# Patient Record
Sex: Female | Born: 1950 | Race: Black or African American | Smoking: Former smoker
Health system: Southern US, Community
[De-identification: ages and names within clinical notes are randomized; demographics above are authoritative.]

## PROBLEM LIST (undated history)

## (undated) DIAGNOSIS — K219 Gastro-esophageal reflux disease without esophagitis: Secondary | ICD-10-CM

## (undated) DIAGNOSIS — R002 Palpitations: Secondary | ICD-10-CM

## (undated) DIAGNOSIS — F17200 Nicotine dependence, unspecified, uncomplicated: Secondary | ICD-10-CM

## (undated) DIAGNOSIS — I639 Cerebral infarction, unspecified: Secondary | ICD-10-CM

## (undated) DIAGNOSIS — R454 Irritability and anger: Secondary | ICD-10-CM

## (undated) DIAGNOSIS — F419 Anxiety disorder, unspecified: Secondary | ICD-10-CM

## (undated) DIAGNOSIS — M1990 Unspecified osteoarthritis, unspecified site: Secondary | ICD-10-CM

## (undated) DIAGNOSIS — T7840XA Allergy, unspecified, initial encounter: Secondary | ICD-10-CM

## (undated) DIAGNOSIS — E785 Hyperlipidemia, unspecified: Secondary | ICD-10-CM

## (undated) DIAGNOSIS — R7302 Impaired glucose tolerance (oral): Secondary | ICD-10-CM

## (undated) DIAGNOSIS — H269 Unspecified cataract: Secondary | ICD-10-CM

## (undated) DIAGNOSIS — L509 Urticaria, unspecified: Secondary | ICD-10-CM

## (undated) DIAGNOSIS — G4700 Insomnia, unspecified: Secondary | ICD-10-CM

## (undated) DIAGNOSIS — D649 Anemia, unspecified: Secondary | ICD-10-CM

## (undated) DIAGNOSIS — F32A Depression, unspecified: Secondary | ICD-10-CM

## (undated) DIAGNOSIS — T783XXA Angioneurotic edema, initial encounter: Secondary | ICD-10-CM

## (undated) DIAGNOSIS — N189 Chronic kidney disease, unspecified: Secondary | ICD-10-CM

## (undated) DIAGNOSIS — E559 Vitamin D deficiency, unspecified: Secondary | ICD-10-CM

## (undated) DIAGNOSIS — R921 Mammographic calcification found on diagnostic imaging of breast: Secondary | ICD-10-CM

## (undated) DIAGNOSIS — T4145XA Adverse effect of unspecified anesthetic, initial encounter: Secondary | ICD-10-CM

## (undated) DIAGNOSIS — I7300 Raynaud's syndrome without gangrene: Secondary | ICD-10-CM

## (undated) DIAGNOSIS — M47817 Spondylosis without myelopathy or radiculopathy, lumbosacral region: Secondary | ICD-10-CM

## (undated) DIAGNOSIS — K5790 Diverticulosis of intestine, part unspecified, without perforation or abscess without bleeding: Secondary | ICD-10-CM

## (undated) DIAGNOSIS — I6529 Occlusion and stenosis of unspecified carotid artery: Secondary | ICD-10-CM

## (undated) DIAGNOSIS — F329 Major depressive disorder, single episode, unspecified: Secondary | ICD-10-CM

## (undated) DIAGNOSIS — I10 Essential (primary) hypertension: Secondary | ICD-10-CM

## (undated) DIAGNOSIS — T8859XA Other complications of anesthesia, initial encounter: Secondary | ICD-10-CM

## (undated) DIAGNOSIS — N951 Menopausal and female climacteric states: Secondary | ICD-10-CM

## (undated) DIAGNOSIS — IMO0002 Reserved for concepts with insufficient information to code with codable children: Secondary | ICD-10-CM

## (undated) DIAGNOSIS — N6019 Diffuse cystic mastopathy of unspecified breast: Secondary | ICD-10-CM

## (undated) HISTORY — DX: Urticaria, unspecified: L50.9

## (undated) HISTORY — DX: Unspecified osteoarthritis, unspecified site: M19.90

## (undated) HISTORY — DX: Allergy, unspecified, initial encounter: T78.40XA

## (undated) HISTORY — DX: Impaired glucose tolerance (oral): R73.02

## (undated) HISTORY — DX: Anxiety disorder, unspecified: F41.9

## (undated) HISTORY — DX: Vitamin D deficiency, unspecified: E55.9

## (undated) HISTORY — DX: Irritability and anger: R45.4

## (undated) HISTORY — PX: EYE SURGERY: SHX253

## (undated) HISTORY — DX: Mammographic calcification found on diagnostic imaging of breast: R92.1

## (undated) HISTORY — DX: Chronic kidney disease, unspecified: N18.9

## (undated) HISTORY — DX: Spondylosis without myelopathy or radiculopathy, lumbosacral region: M47.817

## (undated) HISTORY — DX: Nicotine dependence, unspecified, uncomplicated: F17.200

## (undated) HISTORY — DX: Unspecified cataract: H26.9

## (undated) HISTORY — DX: Occlusion and stenosis of unspecified carotid artery: I65.29

## (undated) HISTORY — DX: Menopausal and female climacteric states: N95.1

## (undated) HISTORY — PX: ABDOMINAL HYSTERECTOMY: SHX81

## (undated) HISTORY — DX: Diverticulosis of intestine, part unspecified, without perforation or abscess without bleeding: K57.90

## (undated) HISTORY — DX: Anemia, unspecified: D64.9

## (undated) HISTORY — DX: Reserved for concepts with insufficient information to code with codable children: IMO0002

## (undated) HISTORY — DX: Insomnia, unspecified: G47.00

## (undated) HISTORY — DX: Gastro-esophageal reflux disease without esophagitis: K21.9

## (undated) HISTORY — PX: TONSILLECTOMY AND ADENOIDECTOMY: SUR1326

## (undated) HISTORY — DX: Essential (primary) hypertension: I10

## (undated) HISTORY — PX: CAROTID ENDARTERECTOMY: SUR193

## (undated) HISTORY — PX: BREAST EXCISIONAL BIOPSY: SUR124

## (undated) HISTORY — DX: Raynaud's syndrome without gangrene: I73.00

## (undated) HISTORY — PX: BARTHOLIN GLAND CYST EXCISION: SHX565

## (undated) HISTORY — DX: Palpitations: R00.2

## (undated) HISTORY — PX: APPENDECTOMY: SHX54

## (undated) HISTORY — DX: Depression, unspecified: F32.A

## (undated) HISTORY — DX: Angioneurotic edema, initial encounter: T78.3XXA

## (undated) HISTORY — DX: Diffuse cystic mastopathy of unspecified breast: N60.19

## (undated) HISTORY — DX: Major depressive disorder, single episode, unspecified: F32.9

## (undated) HISTORY — DX: Hyperlipidemia, unspecified: E78.5

---
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
## 1986-03-05 HISTORY — PX: ABDOMINAL HYSTERECTOMY: SHX81

## 1998-03-08 ENCOUNTER — Ambulatory Visit (HOSPITAL_COMMUNITY): Admission: RE | Admit: 1998-03-08 | Discharge: 1998-03-08 | Payer: Self-pay | Admitting: Internal Medicine

## 1998-03-08 ENCOUNTER — Encounter: Payer: Self-pay | Admitting: Internal Medicine

## 1998-04-06 ENCOUNTER — Ambulatory Visit: Admission: RE | Admit: 1998-04-06 | Discharge: 1998-04-06 | Payer: Self-pay | Admitting: Internal Medicine

## 1998-09-02 ENCOUNTER — Other Ambulatory Visit: Admission: RE | Admit: 1998-09-02 | Discharge: 1998-09-02 | Payer: Self-pay | Admitting: Obstetrics and Gynecology

## 1999-02-17 ENCOUNTER — Emergency Department (HOSPITAL_COMMUNITY): Admission: EM | Admit: 1999-02-17 | Discharge: 1999-02-17 | Payer: Self-pay | Admitting: Internal Medicine

## 1999-02-17 ENCOUNTER — Encounter: Payer: Self-pay | Admitting: Internal Medicine

## 1999-11-01 ENCOUNTER — Other Ambulatory Visit: Admission: RE | Admit: 1999-11-01 | Discharge: 1999-11-01 | Payer: Self-pay | Admitting: Obstetrics and Gynecology

## 2000-11-01 ENCOUNTER — Other Ambulatory Visit: Admission: RE | Admit: 2000-11-01 | Discharge: 2000-11-01 | Payer: Self-pay | Admitting: Internal Medicine

## 2004-02-04 ENCOUNTER — Ambulatory Visit: Payer: Self-pay | Admitting: Internal Medicine

## 2004-02-11 ENCOUNTER — Ambulatory Visit: Payer: Self-pay | Admitting: Internal Medicine

## 2004-03-17 ENCOUNTER — Ambulatory Visit: Payer: Self-pay | Admitting: Gastroenterology

## 2004-03-27 ENCOUNTER — Ambulatory Visit: Payer: Self-pay | Admitting: Gastroenterology

## 2004-03-30 ENCOUNTER — Ambulatory Visit: Payer: Self-pay | Admitting: Gastroenterology

## 2005-01-11 ENCOUNTER — Ambulatory Visit: Payer: Self-pay | Admitting: Internal Medicine

## 2005-02-07 ENCOUNTER — Ambulatory Visit: Payer: Self-pay | Admitting: Internal Medicine

## 2005-02-13 ENCOUNTER — Ambulatory Visit: Payer: Self-pay | Admitting: Internal Medicine

## 2005-03-06 ENCOUNTER — Ambulatory Visit: Payer: Self-pay | Admitting: Internal Medicine

## 2005-05-21 ENCOUNTER — Ambulatory Visit: Payer: Self-pay | Admitting: Internal Medicine

## 2005-06-01 ENCOUNTER — Ambulatory Visit: Payer: Self-pay | Admitting: Internal Medicine

## 2005-06-01 ENCOUNTER — Ambulatory Visit: Payer: Self-pay

## 2005-07-11 ENCOUNTER — Ambulatory Visit: Payer: Self-pay | Admitting: Internal Medicine

## 2005-07-20 ENCOUNTER — Ambulatory Visit: Payer: Self-pay | Admitting: Internal Medicine

## 2005-08-03 ENCOUNTER — Ambulatory Visit: Payer: Self-pay | Admitting: Internal Medicine

## 2005-08-21 ENCOUNTER — Ambulatory Visit: Payer: Self-pay | Admitting: Internal Medicine

## 2005-08-29 ENCOUNTER — Ambulatory Visit: Payer: Self-pay | Admitting: Internal Medicine

## 2005-09-25 ENCOUNTER — Ambulatory Visit: Payer: Self-pay | Admitting: Internal Medicine

## 2005-12-31 ENCOUNTER — Ambulatory Visit: Payer: Self-pay | Admitting: Internal Medicine

## 2005-12-31 LAB — CONVERTED CEMR LAB
ALT: 19 units/L (ref 0–40)
AST: 20 units/L (ref 0–37)
Albumin: 3.4 g/dL — ABNORMAL LOW (ref 3.5–5.2)
Alkaline Phosphatase: 65 units/L (ref 39–117)
BUN: 9 mg/dL (ref 6–23)
CO2: 28 meq/L (ref 19–32)
Calcium: 9.2 mg/dL (ref 8.4–10.5)
Chloride: 108 meq/L (ref 96–112)
Chol/HDL Ratio, serum: 3.8
Cholesterol: 284 mg/dL (ref 0–200)
Creatinine, Ser: 0.8 mg/dL (ref 0.4–1.2)
GFR calc non Af Amer: 79 mL/min
Glomerular Filtration Rate, Af Am: 96 mL/min/{1.73_m2}
Glucose, Bld: 95 mg/dL (ref 70–99)
HDL: 75.7 mg/dL (ref >39.0)
LDL DIRECT: 176.9 mg/dL
Potassium: 3.8 meq/L (ref 3.5–5.1)
Sodium: 142 meq/L (ref 135–145)
Total Bilirubin: 0.5 mg/dL (ref 0.3–1.2)
Total Protein: 6.8 g/dL (ref 6.0–8.3)
Triglyceride fasting, serum: 83 mg/dL (ref 0–149)
VLDL: 17 mg/dL (ref 0–40)

## 2006-01-03 ENCOUNTER — Ambulatory Visit: Payer: Self-pay | Admitting: Internal Medicine

## 2006-01-10 ENCOUNTER — Ambulatory Visit: Payer: Self-pay | Admitting: Internal Medicine

## 2006-07-20 ENCOUNTER — Ambulatory Visit: Payer: Self-pay | Admitting: Family Medicine

## 2006-10-19 ENCOUNTER — Encounter: Payer: Self-pay | Admitting: *Deleted

## 2006-10-19 DIAGNOSIS — I10 Essential (primary) hypertension: Secondary | ICD-10-CM | POA: Insufficient documentation

## 2007-04-14 ENCOUNTER — Encounter: Payer: Self-pay | Admitting: Internal Medicine

## 2007-04-15 ENCOUNTER — Telehealth (INDEPENDENT_AMBULATORY_CARE_PROVIDER_SITE_OTHER): Payer: Self-pay | Admitting: *Deleted

## 2007-04-22 ENCOUNTER — Encounter: Payer: Self-pay | Admitting: Internal Medicine

## 2007-04-30 ENCOUNTER — Ambulatory Visit: Payer: Self-pay | Admitting: Internal Medicine

## 2007-04-30 LAB — CONVERTED CEMR LAB
ALT: 15 units/L (ref 0–35)
AST: 17 units/L (ref 0–37)
Albumin: 3.5 g/dL (ref 3.5–5.2)
Alkaline Phosphatase: 73 units/L (ref 39–117)
BUN: 10 mg/dL (ref 6–23)
Basophils Absolute: 0 10*3/uL (ref 0.0–0.1)
Basophils Relative: 0.7 % (ref 0.0–1.0)
Bilirubin Urine: NEGATIVE
Bilirubin, Direct: 0.1 mg/dL (ref 0.0–0.3)
CO2: 29 meq/L (ref 19–32)
Calcium: 9.5 mg/dL (ref 8.4–10.5)
Chloride: 106 meq/L (ref 96–112)
Cholesterol: 223 mg/dL (ref 0–200)
Creatinine, Ser: 0.6 mg/dL (ref 0.4–1.2)
Direct LDL: 132.9 mg/dL
Eosinophils Absolute: 0.1 10*3/uL (ref 0.0–0.6)
Eosinophils Relative: 1.6 % (ref 0.0–5.0)
GFR calc Af Amer: 133 mL/min
GFR calc non Af Amer: 110 mL/min
Glucose, Bld: 80 mg/dL (ref 70–99)
HCT: 39.8 % (ref 36.0–46.0)
HDL: 81.1 mg/dL (ref >39.0)
Hemoglobin, Urine: NEGATIVE
Hemoglobin: 12.9 g/dL (ref 12.0–15.0)
Ketones, ur: NEGATIVE mg/dL
Leukocytes, UA: NEGATIVE
Lymphocytes Relative: 43.2 % (ref 12.0–46.0)
MCHC: 32.4 g/dL (ref 30.0–36.0)
MCV: 84.9 fL (ref 78.0–100.0)
Monocytes Absolute: 0.6 10*3/uL (ref 0.2–0.7)
Monocytes Relative: 11.4 % — ABNORMAL HIGH (ref 3.0–11.0)
Neutro Abs: 2.4 10*3/uL (ref 1.4–7.7)
Neutrophils Relative %: 43.1 % (ref 43.0–77.0)
Nitrite: NEGATIVE
Platelets: 327 10*3/uL (ref 150–400)
Potassium: 3.5 meq/L (ref 3.5–5.1)
RBC: 4.69 M/uL (ref 3.87–5.11)
RDW: 15.2 % — ABNORMAL HIGH (ref 11.5–14.6)
Sodium: 140 meq/L (ref 135–145)
Specific Gravity, Urine: 1.015 (ref 1.000–1.03)
TSH: 1.97 microintl units/mL (ref 0.35–5.50)
Total Bilirubin: 0.6 mg/dL (ref 0.3–1.2)
Total CHOL/HDL Ratio: 2.7
Total Protein, Urine: NEGATIVE mg/dL
Total Protein: 6.9 g/dL (ref 6.0–8.3)
Triglycerides: 67 mg/dL (ref 0–149)
Urine Glucose: NEGATIVE mg/dL
Urobilinogen, UA: 0.2 (ref 0.0–1.0)
VLDL: 13 mg/dL (ref 0–40)
WBC: 5.6 10*3/uL (ref 4.5–10.5)
pH: 6.5 (ref 5.0–8.0)

## 2007-05-06 ENCOUNTER — Ambulatory Visit: Payer: Self-pay | Admitting: Internal Medicine

## 2007-05-06 DIAGNOSIS — R079 Chest pain, unspecified: Secondary | ICD-10-CM | POA: Insufficient documentation

## 2007-05-06 DIAGNOSIS — F17200 Nicotine dependence, unspecified, uncomplicated: Secondary | ICD-10-CM | POA: Insufficient documentation

## 2007-05-06 DIAGNOSIS — N760 Acute vaginitis: Secondary | ICD-10-CM | POA: Insufficient documentation

## 2007-05-06 DIAGNOSIS — E785 Hyperlipidemia, unspecified: Secondary | ICD-10-CM | POA: Insufficient documentation

## 2007-06-04 ENCOUNTER — Encounter: Payer: Self-pay | Admitting: Internal Medicine

## 2007-10-24 ENCOUNTER — Emergency Department: Payer: Self-pay | Admitting: Emergency Medicine

## 2008-03-01 ENCOUNTER — Telehealth: Payer: Self-pay | Admitting: Internal Medicine

## 2008-06-01 ENCOUNTER — Telehealth: Payer: Self-pay | Admitting: Internal Medicine

## 2008-07-15 ENCOUNTER — Ambulatory Visit: Payer: Self-pay

## 2008-12-22 ENCOUNTER — Encounter: Payer: Self-pay | Admitting: Rheumatology

## 2008-12-28 ENCOUNTER — Emergency Department: Payer: Self-pay | Admitting: Emergency Medicine

## 2009-01-03 ENCOUNTER — Encounter: Payer: Self-pay | Admitting: Rheumatology

## 2010-02-15 ENCOUNTER — Ambulatory Visit: Payer: Self-pay

## 2010-03-05 DIAGNOSIS — R921 Mammographic calcification found on diagnostic imaging of breast: Secondary | ICD-10-CM

## 2010-03-05 HISTORY — DX: Mammographic calcification found on diagnostic imaging of breast: R92.1

## 2010-03-08 ENCOUNTER — Ambulatory Visit: Payer: Self-pay

## 2010-04-05 HISTORY — PX: COLONOSCOPY W/ POLYPECTOMY: SHX1380

## 2010-04-12 ENCOUNTER — Ambulatory Visit: Payer: Self-pay | Admitting: Gastroenterology

## 2010-04-12 LAB — HM COLONOSCOPY

## 2010-05-01 ENCOUNTER — Observation Stay: Payer: Self-pay | Admitting: Internal Medicine

## 2010-06-06 ENCOUNTER — Ambulatory Visit: Payer: Self-pay | Admitting: Family Medicine

## 2010-07-03 ENCOUNTER — Ambulatory Visit: Payer: Self-pay | Admitting: General Surgery

## 2010-07-05 LAB — PATHOLOGY REPORT

## 2010-07-21 NOTE — Assessment & Plan Note (Signed)
[facility]                             PRIMARY CARE OFFICE NOTE   Judy Carlson, Judy Carlson                        MRN:          [id number]  DATE:01/29/2006                            DOB:          1950/11/29    TELEPHONE NOTE   [name] was last seen in the office January 09, 2006.  There had been a  mix-up in her medications being filled and she was given amlodipine instead  of Caduet.   The patient calls today to report that she is having problems with  circulation where her hands will get cold and tingly and have no blood  flow.  She believes this is related to the Norvasc/amlodipine.   I advised the patient to discontinue amlodipine for 7-10 days to see if her  symptoms improve, given that calcium channel blockers usually do not cause  vasoconstriction.  The patient is also called in a prescription for Lipitor  20 mg daily.     Rosalyn Gess Norins, MD  Electronically Signed    MEN/MedQ  DD: 01/29/2006  DT: 01/29/2006  Job #: [id number]

## 2010-10-05 ENCOUNTER — Ambulatory Visit: Payer: Self-pay | Admitting: General Surgery

## 2010-10-11 ENCOUNTER — Ambulatory Visit: Payer: Self-pay | Admitting: Rheumatology

## 2010-11-04 HISTORY — PX: BREAST BIOPSY: SHX20

## 2010-11-09 ENCOUNTER — Ambulatory Visit: Payer: Self-pay | Admitting: General Surgery

## 2010-11-13 LAB — PATHOLOGY REPORT

## 2011-02-22 ENCOUNTER — Other Ambulatory Visit: Payer: Self-pay | Admitting: Family Medicine

## 2011-03-06 DIAGNOSIS — IMO0002 Reserved for concepts with insufficient information to code with codable children: Secondary | ICD-10-CM

## 2011-03-06 HISTORY — DX: Reserved for concepts with insufficient information to code with codable children: IMO0002

## 2011-03-28 ENCOUNTER — Ambulatory Visit: Payer: Self-pay | Admitting: General Surgery

## 2011-05-02 ENCOUNTER — Other Ambulatory Visit: Payer: Self-pay | Admitting: Family Medicine

## 2011-05-02 LAB — CBC WITH DIFFERENTIAL/PLATELET
Basophil #: 0 10*3/uL (ref 0.0–0.1)
Eosinophil #: 0.1 10*3/uL (ref 0.0–0.7)
Eosinophil %: 1.1 %
HCT: 39.7 % (ref 35.0–47.0)
HGB: 13 g/dL (ref 12.0–16.0)
Lymphocyte #: 2.2 10*3/uL (ref 1.0–3.6)
Lymphocyte %: 33.6 %
MCH: 27.7 pg (ref 26.0–34.0)
Monocyte #: 0.9 10*3/uL — ABNORMAL HIGH (ref 0.0–0.7)
Neutrophil %: 51.4 %
RBC: 4.68 10*6/uL (ref 3.80–5.20)

## 2011-05-02 LAB — LIPID PANEL
Cholesterol: 231 mg/dL — ABNORMAL HIGH (ref 0–200)
HDL Cholesterol: 89 mg/dL — ABNORMAL HIGH (ref 40–60)
Ldl Cholesterol, Calc: 130 mg/dL — ABNORMAL HIGH (ref 0–100)
Triglycerides: 59 mg/dL (ref 0–200)
VLDL Cholesterol, Calc: 12 mg/dL (ref 5–40)

## 2011-05-02 LAB — COMPREHENSIVE METABOLIC PANEL
Albumin: 4 g/dL (ref 3.4–5.0)
Alkaline Phosphatase: 93 U/L (ref 50–136)
Anion Gap: 10 (ref 7–16)
Calcium, Total: 9.9 mg/dL (ref 8.5–10.1)
Chloride: 107 mmol/L (ref 98–107)
Co2: 29 mmol/L (ref 21–32)
EGFR (African American): >60
Glucose: 89 mg/dL (ref 65–99)
Osmolality: 292 (ref 275–301)
SGOT(AST): 24 U/L (ref 15–37)
Sodium: 146 mmol/L — ABNORMAL HIGH (ref 136–145)

## 2011-05-02 LAB — CK: CK, Total: 68 U/L (ref 21–215)

## 2011-06-19 ENCOUNTER — Ambulatory Visit: Payer: Self-pay | Admitting: Family Medicine

## 2011-06-19 LAB — CBC WITH DIFFERENTIAL/PLATELET
Eosinophil %: 2.4 %
HGB: 13.3 g/dL (ref 12.0–16.0)
Lymphocyte #: 2.8 10*3/uL (ref 1.0–3.6)
MCH: 27.8 pg (ref 26.0–34.0)
Monocyte #: 0.9 x10 3/mm (ref 0.2–0.9)
Neutrophil #: 3.1 10*3/uL (ref 1.4–6.5)
Neutrophil %: 44.7 %
RDW: 16.4 % — ABNORMAL HIGH (ref 11.5–14.5)

## 2011-06-19 LAB — COMPREHENSIVE METABOLIC PANEL
Albumin: 4.1 g/dL (ref 3.4–5.0)
Anion Gap: 8 (ref 7–16)
BUN: 17 mg/dL (ref 7–18)
Bilirubin,Total: 0.2 mg/dL (ref 0.2–1.0)
Calcium, Total: 9.7 mg/dL (ref 8.5–10.1)
Chloride: 107 mmol/L (ref 98–107)
Creatinine: 0.7 mg/dL (ref 0.60–1.30)
EGFR (African American): >60
Osmolality: 284 (ref 275–301)
Potassium: 4.3 mmol/L (ref 3.5–5.1)
SGOT(AST): 24 U/L (ref 15–37)
Sodium: 142 mmol/L (ref 136–145)

## 2011-06-19 LAB — LIPID PANEL
Cholesterol: 241 mg/dL — ABNORMAL HIGH (ref 0–200)
HDL Cholesterol: 84 mg/dL — ABNORMAL HIGH (ref 40–60)
Ldl Cholesterol, Calc: 143 mg/dL — ABNORMAL HIGH (ref 0–100)
Triglycerides: 71 mg/dL (ref 0–200)
VLDL Cholesterol, Calc: 14 mg/dL (ref 5–40)

## 2011-06-19 LAB — CK: CK, Total: 186 U/L (ref 21–215)

## 2011-06-19 LAB — HEMOGLOBIN A1C: Hemoglobin A1C: 5.8 % (ref 4.2–6.3)

## 2012-02-04 ENCOUNTER — Encounter: Payer: Self-pay | Admitting: Family Medicine

## 2012-02-04 ENCOUNTER — Ambulatory Visit (INDEPENDENT_AMBULATORY_CARE_PROVIDER_SITE_OTHER): Payer: Self-pay | Admitting: Family Medicine

## 2012-02-04 VITALS — BP 108/74 | HR 62 | Temp 98.1°F | Resp 16 | Ht 64.0 in | Wt 153.2 lb

## 2012-02-04 DIAGNOSIS — E785 Hyperlipidemia, unspecified: Secondary | ICD-10-CM | POA: Insufficient documentation

## 2012-02-04 DIAGNOSIS — F430 Acute stress reaction: Secondary | ICD-10-CM | POA: Insufficient documentation

## 2012-02-04 DIAGNOSIS — E559 Vitamin D deficiency, unspecified: Secondary | ICD-10-CM

## 2012-02-04 DIAGNOSIS — E7800 Pure hypercholesterolemia, unspecified: Secondary | ICD-10-CM

## 2012-02-04 DIAGNOSIS — I10 Essential (primary) hypertension: Secondary | ICD-10-CM | POA: Insufficient documentation

## 2012-02-04 DIAGNOSIS — L0233 Carbuncle of buttock: Secondary | ICD-10-CM

## 2012-02-04 DIAGNOSIS — Z23 Encounter for immunization: Secondary | ICD-10-CM | POA: Insufficient documentation

## 2012-02-04 LAB — COMPREHENSIVE METABOLIC PANEL
ALT: 16 U/L (ref 0–35)
Albumin: 4.5 g/dL (ref 3.5–5.2)
CO2: 29 mEq/L (ref 19–32)
Calcium: 9.9 mg/dL (ref 8.4–10.5)
Chloride: 107 mEq/L (ref 96–112)
Creat: 0.82 mg/dL (ref 0.50–1.10)
Sodium: 145 mEq/L (ref 135–145)
Total Protein: 7.1 g/dL (ref 6.0–8.3)

## 2012-02-04 LAB — LIPID PANEL
LDL Cholesterol: 135 mg/dL — ABNORMAL HIGH (ref 0–99)
Triglycerides: 61 mg/dL (ref <150)

## 2012-02-04 MED ORDER — SULFAMETHOXAZOLE-TRIMETHOPRIM 800-160 MG PO TABS: 1.0000 | ORAL_TABLET | Freq: Two times a day (BID) | ORAL | Status: DC

## 2012-02-04 NOTE — Assessment & Plan Note (Signed)
Controlled; continue current medications; obtain labs.
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
## 2012-02-04 NOTE — Assessment & Plan Note (Signed)
New.  S/p I&D in office with scant drainage expressed. Warm soaks daily for next two weeks;  Rx for Bactrim provided.  RTC for acute worsening.

## 2012-02-04 NOTE — Assessment & Plan Note (Signed)
Administered in office.

## 2012-02-04 NOTE — Assessment & Plan Note (Signed)
Controlled; obtain labs; continue current medication. 

## 2012-02-04 NOTE — Assessment & Plan Note (Signed)
Uncontrolled; obtain labs; continue current supplementation. 
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
## 2012-02-05 ENCOUNTER — Encounter: Payer: Self-pay | Admitting: Gastroenterology

## 2012-04-09 ENCOUNTER — Ambulatory Visit: Payer: Self-pay | Admitting: General Surgery

## 2012-07-16 ENCOUNTER — Other Ambulatory Visit: Payer: Self-pay | Admitting: Family Medicine

## 2012-08-04 ENCOUNTER — Ambulatory Visit (INDEPENDENT_AMBULATORY_CARE_PROVIDER_SITE_OTHER): Payer: Managed Care, Other (non HMO) | Admitting: Family Medicine

## 2012-08-04 ENCOUNTER — Encounter: Payer: Self-pay | Admitting: Family Medicine

## 2012-08-04 VITALS — BP 138/78 | HR 81 | Temp 98.1°F | Resp 16 | Ht 64.5 in | Wt 156.8 lb

## 2012-08-04 DIAGNOSIS — R7309 Other abnormal glucose: Secondary | ICD-10-CM

## 2012-08-04 DIAGNOSIS — I878 Other specified disorders of veins: Secondary | ICD-10-CM

## 2012-08-04 DIAGNOSIS — E559 Vitamin D deficiency, unspecified: Secondary | ICD-10-CM

## 2012-08-04 DIAGNOSIS — I10 Essential (primary) hypertension: Secondary | ICD-10-CM

## 2012-08-04 DIAGNOSIS — E7800 Pure hypercholesterolemia, unspecified: Secondary | ICD-10-CM

## 2012-08-04 DIAGNOSIS — I872 Venous insufficiency (chronic) (peripheral): Secondary | ICD-10-CM

## 2012-08-04 DIAGNOSIS — F430 Acute stress reaction: Secondary | ICD-10-CM

## 2012-08-04 MED ORDER — MELOXICAM 15 MG PO TABS: 15.0000 mg | ORAL_TABLET | Freq: Every day | ORAL | Status: DC

## 2012-08-04 MED ORDER — PRAVASTATIN SODIUM 40 MG PO TABS: 40.0000 mg | ORAL_TABLET | Freq: Every day | ORAL | Status: DC

## 2012-08-04 MED ORDER — METOPROLOL SUCCINATE ER 100 MG PO TB24: 100.0000 mg | ORAL_TABLET | Freq: Every day | ORAL | Status: DC

## 2012-08-04 MED ORDER — FUROSEMIDE 40 MG PO TABS: 40.0000 mg | ORAL_TABLET | Freq: Every day | ORAL | Status: DC

## 2012-08-04 MED ORDER — AMLODIPINE BESYLATE 10 MG PO TABS: 10.0000 mg | ORAL_TABLET | Freq: Every day | ORAL | Status: DC

## 2012-08-04 NOTE — Assessment & Plan Note (Signed)
Controlled.  Obtain labs; continue current medications.

## 2012-08-04 NOTE — Assessment & Plan Note (Signed)
Stable; obtain labs; refill provided.

## 2012-08-04 NOTE — Assessment & Plan Note (Signed)
Controlled; obtain labs; continue current medications; refills provided.

## 2012-08-04 NOTE — Assessment & Plan Note (Signed)
Controlled; obtain labs; continue current medications. 
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
## 2012-08-04 NOTE — Assessment & Plan Note (Signed)
Improved; plans to move in with father in upcoming few months.

## 2012-08-05 LAB — COMPREHENSIVE METABOLIC PANEL
AST: 24 IU/L (ref 0–40)
Albumin/Globulin Ratio: 1.6 (ref 1.1–2.5)
Alkaline Phosphatase: 113 IU/L (ref 39–117)
BUN/Creatinine Ratio: 13 (ref 11–26)
Creatinine, Ser: 0.76 mg/dL (ref 0.57–1.00)
GFR calc Af Amer: 97 mL/min/{1.73_m2} (ref >59)
GFR calc non Af Amer: 84 mL/min/{1.73_m2} (ref >59)
Globulin, Total: 2.7 g/dL (ref 1.5–4.5)
Sodium: 141 mmol/L (ref 134–144)

## 2012-08-05 LAB — CBC WITH DIFFERENTIAL/PLATELET
Basos: 0 % (ref 0–3)
Eos: 1 % (ref 0–5)
HCT: 38.2 % (ref 34.0–46.6)
Immature Granulocytes: 0 % (ref 0–2)
Lymphocytes Absolute: 2.4 10*3/uL (ref 0.7–3.1)
MCV: 81 fL (ref 79–97)
Monocytes Absolute: 0.7 10*3/uL (ref 0.1–0.9)
RDW: 17.7 % — ABNORMAL HIGH (ref 12.3–15.4)
WBC: 8 10*3/uL (ref 3.4–10.8)

## 2012-08-05 LAB — VITAMIN D 25 HYDROXY (VIT D DEFICIENCY, FRACTURES): Vit D, 25-Hydroxy: 37 ng/mL (ref 30.0–100.0)

## 2012-08-05 LAB — TSH: TSH: 1.28 u[IU]/mL (ref 0.450–4.500)

## 2012-08-05 LAB — LIPID PANEL: VLDL Cholesterol Cal: 13 mg/dL (ref 5–40)

## 2012-08-20 ENCOUNTER — Telehealth: Payer: Self-pay

## 2012-08-20 MED ORDER — ATORVASTATIN CALCIUM 10 MG PO TABS: 10.0000 mg | ORAL_TABLET | Freq: Every day | ORAL | Status: DC

## 2012-08-20 NOTE — Addendum Note (Signed)
Addended by: Ethelda Chick on: 08/20/2012 11:29 AM   Modules accepted: Orders

## 2012-08-20 NOTE — Telephone Encounter (Signed)
Pt states that Dr.Smith was going to change her from  prevastatin to Lipitor pt states that she is unable to take Lipitor due to the leg cramps she experiences from taking it. Best# [phone]

## 2012-08-20 NOTE — Telephone Encounter (Signed)
Please thank patient for calling back; I was thinking that she may have had a side effect to Lipitor in the past.  I did already send in rx for Lipitor to pharmacy; please call pharmacy and cancel rx for Lipitor.  Continue current dose of Pravastatin for now; we will just repeat cholesterol at follow-up visit and adjust medication if needed at that time.

## 2012-08-21 NOTE — Telephone Encounter (Signed)
Called patient called pharmacy.

## 2012-08-22 ENCOUNTER — Encounter: Payer: Self-pay | Admitting: *Deleted

## 2012-09-03 ENCOUNTER — Encounter: Payer: Self-pay | Admitting: *Deleted

## 2012-10-02 ENCOUNTER — Ambulatory Visit (INDEPENDENT_AMBULATORY_CARE_PROVIDER_SITE_OTHER): Payer: Managed Care, Other (non HMO) | Admitting: Emergency Medicine

## 2012-10-02 VITALS — BP 140/76 | HR 74 | Temp 98.1°F | Resp 18 | Ht 65.0 in | Wt 160.0 lb

## 2012-10-02 DIAGNOSIS — S335XXA Sprain of ligaments of lumbar spine, initial encounter: Secondary | ICD-10-CM

## 2012-10-02 DIAGNOSIS — S39012A Strain of muscle, fascia and tendon of lower back, initial encounter: Secondary | ICD-10-CM

## 2012-10-02 MED ORDER — CYCLOBENZAPRINE HCL 10 MG PO TABS: 10.0000 mg | ORAL_TABLET | Freq: Three times a day (TID) | ORAL | Status: DC | PRN

## 2012-10-02 MED ORDER — MELOXICAM 15 MG PO TABS: 15.0000 mg | ORAL_TABLET | Freq: Every day | ORAL | Status: DC

## 2012-10-02 MED ORDER — HYDROCODONE-ACETAMINOPHEN 5-325 MG PO TABS: 1.0000 | ORAL_TABLET | Freq: Four times a day (QID) | ORAL | Status: DC | PRN

## 2012-10-02 NOTE — Patient Instructions (Addendum)

## 2012-10-02 NOTE — Progress Notes (Signed)
  Subjective:    Judy Carlson is a 62 y.o. female who presents for evaluation of low back pain. The patient has had recurrent self limited episodes of low back pain in the past. Symptoms have been present for 3 days and are gradually worsening.  Onset was related to / precipitated by a twisting movement. The pain is located in the left lumbar area, left sacroiliac area, left gluteal area or radiating to left leg(s) and radiates to the buttock, left thigh, left lower leg. The pain is described as aching and burning and occurs all day. She rates her pain as moderate. Symptoms are exacerbated by sitting, standing and twisting. Symptoms are improved by change in body position and rest. She has also tried muscle relaxants and NSAIDs which provided no symptom relief. She has no other symptoms associated with the back pain. The patient has no "red flag" history indicative of complicated back pain.  The following portions of the patient's history were reviewed and updated as appropriate: allergies, current medications, past family history, past medical history, past social history, past surgical history and problem list.  Review of Systems A comprehensive review of systems was negative.    Objective:   Full range of motion without pain, no tenderness, no spasm, no curvature. Normal reflexes, gait, strength and negative straight-leg raise. Inspection and palpation: paraspinal tenderness noted on left, sacroiliac tenderness noted on left. Muscle tone and ROM exam: muscle tone normal without spasm. Straight leg raise: negative at 90 degrees on the left. Neurological: normal DTRs, muscle strength and reflexes.    Assessment:    Nonspecific acute low back pain    Plan:    Natural history and expected course discussed. Questions answered. Proper lifting, bending technique discussed. Heat to affected area as needed for local pain relief. NSAIDs per medication orders. Muscle relaxants per medication  orders. Follow-up in 1 week.

## 2013-02-04 ENCOUNTER — Encounter: Payer: Self-pay | Admitting: Family Medicine

## 2013-02-04 ENCOUNTER — Ambulatory Visit: Payer: Managed Care, Other (non HMO) | Admitting: Family Medicine

## 2013-02-04 VITALS — BP 134/87 | HR 83 | Temp 98.5°F | Resp 16 | Ht 64.0 in | Wt 162.6 lb

## 2013-02-04 DIAGNOSIS — R7309 Other abnormal glucose: Secondary | ICD-10-CM

## 2013-02-04 DIAGNOSIS — E7800 Pure hypercholesterolemia, unspecified: Secondary | ICD-10-CM

## 2013-02-04 DIAGNOSIS — Z0000 Encounter for general adult medical examination without abnormal findings: Secondary | ICD-10-CM

## 2013-02-04 DIAGNOSIS — I10 Essential (primary) hypertension: Secondary | ICD-10-CM

## 2013-02-04 LAB — CBC WITH DIFFERENTIAL/PLATELET
Eosinophils Relative: 1 % (ref 0–5)
Lymphocytes Relative: 39 % (ref 12–46)
Lymphs Abs: 2.6 10*3/uL (ref 0.7–4.0)
MCV: 80.1 fL (ref 78.0–100.0)
Monocytes Absolute: 0.6 10*3/uL (ref 0.1–1.0)
Monocytes Relative: 9 % (ref 3–12)
RDW: 17.1 % — ABNORMAL HIGH (ref 11.5–15.5)

## 2013-02-04 LAB — POCT URINALYSIS DIPSTICK
Bilirubin, UA: NEGATIVE
Blood, UA: NEGATIVE
Glucose, UA: NEGATIVE
Nitrite, UA: NEGATIVE
Spec Grav, UA: 1.015

## 2013-02-04 LAB — LIPID PANEL
Cholesterol: 250 mg/dL — ABNORMAL HIGH (ref 0–200)
HDL: 86 mg/dL (ref >39)
Total CHOL/HDL Ratio: 2.9 Ratio

## 2013-02-04 LAB — COMPREHENSIVE METABOLIC PANEL
BUN: 11 mg/dL (ref 6–23)
CO2: 27 mEq/L (ref 19–32)
Calcium: 9.7 mg/dL (ref 8.4–10.5)
Chloride: 108 mEq/L (ref 96–112)
Creat: 0.8 mg/dL (ref 0.50–1.10)

## 2013-02-04 MED ORDER — FUROSEMIDE 40 MG PO TABS: 40.0000 mg | ORAL_TABLET | Freq: Every day | ORAL | Status: DC

## 2013-02-04 MED ORDER — AMLODIPINE BESYLATE 10 MG PO TABS: 10.0000 mg | ORAL_TABLET | Freq: Every day | ORAL | Status: DC

## 2013-02-04 MED ORDER — PRAVASTATIN SODIUM 40 MG PO TABS: 40.0000 mg | ORAL_TABLET | Freq: Every day | ORAL | Status: DC

## 2013-02-04 MED ORDER — METOPROLOL SUCCINATE ER 100 MG PO TB24: 100.0000 mg | ORAL_TABLET | Freq: Every day | ORAL | Status: DC

## 2013-02-04 NOTE — Progress Notes (Signed)
Subjective:    Patient ID: Judy Carlson, female    DOB: November 12, 1950, 62 y.o.   MRN: [id number]  HPI This 62 y.o. female presents for Complete Physical Examination.  Last physical 2013. Pap smear 04/2011. Mammogram 11/2011 [facility].   Colonoscopy 2012 Iftikhar two polyps.  Repeat in 5 years.   TDAP less than 10 years ago. Flu vaccine not yet this year.  Pneumovax never. Zostavax never. Eye exam June 2014; +cataract L eye Dental exam several years.   Review of Systems  Constitutional: Negative for fever, chills, diaphoresis, activity change, appetite change, fatigue and unexpected weight change.  HENT: Negative for congestion, dental problem, drooling, ear discharge, ear pain, facial swelling, hearing loss, mouth sores, nosebleeds, postnasal drip, rhinorrhea, sinus pressure, sneezing, sore throat, tinnitus, trouble swallowing and voice change.   Eyes: Negative for photophobia, pain, discharge, redness, itching and visual disturbance.  Respiratory: Negative for apnea, cough, choking, chest tightness, shortness of breath, wheezing and stridor.   Cardiovascular: Negative for chest pain, palpitations and leg swelling.  Gastrointestinal: Negative for nausea, vomiting, abdominal pain, diarrhea, constipation, blood in stool, abdominal distention, anal bleeding and rectal pain.  Endocrine: Negative for cold intolerance, heat intolerance, polydipsia, polyphagia and polyuria.  Genitourinary: Negative for dysuria, urgency, frequency, hematuria, flank pain, decreased urine volume, vaginal bleeding, vaginal discharge, enuresis, difficulty urinating, genital sores, vaginal pain, menstrual problem, pelvic pain and dyspareunia.  Musculoskeletal: Positive for arthralgias and back pain. Negative for gait problem, joint swelling, myalgias, neck pain and neck stiffness.  Skin: Negative for color change, pallor, rash and wound.  Allergic/Immunologic: Negative for environmental allergies, food allergies  and immunocompromised state.  Neurological: Negative for dizziness, tremors, seizures, syncope, facial asymmetry, speech difficulty, weakness, light-headedness, numbness and headaches.  Hematological: Negative for adenopathy. Does not bruise/bleed easily.  Psychiatric/Behavioral: Negative for suicidal ideas, hallucinations, behavioral problems, confusion, sleep disturbance, self-injury, dysphoric mood, decreased concentration and agitation. The patient is not nervous/anxious and is not hyperactive.        Objective:   Physical Exam  Nursing note and vitals reviewed. Constitutional: She is oriented to person, place, and time. She appears well-developed and well-nourished. No distress.  HENT:  Head: Normocephalic and atraumatic.  Right Ear: External ear normal.  Left Ear: External ear normal.  Nose: Nose normal.  Mouth/Throat: Oropharynx is clear and moist.  Eyes: Conjunctivae and EOM are normal. Pupils are equal, round, and reactive to light.  Neck: Normal range of motion and full passive range of motion without pain. Neck supple. No JVD present. Carotid bruit is not present. No thyromegaly present.  Cardiovascular: Normal rate, regular rhythm and normal heart sounds.  Exam reveals no gallop and no friction rub.   No murmur heard. Pulmonary/Chest: Effort normal and breath sounds normal. She has no wheezes. She has no rales.  Abdominal: Soft. Bowel sounds are normal. She exhibits no distension and no mass. There is no tenderness. There is no rebound and no guarding.  Musculoskeletal:       Right shoulder: Normal.       Left shoulder: Normal.       Cervical back: Normal.  Lymphadenopathy:    She has no cervical adenopathy.  Neurological: She is alert and oriented to person, place, and time. She has normal reflexes. No cranial nerve deficit. She exhibits normal muscle tone. Coordination normal.  Skin: Skin is warm and dry. No rash noted. She is not diaphoretic. No erythema. No pallor.    Psychiatric: She has a normal mood  and affect. Her behavior is normal. Judgment and thought content normal.       Assessment & Plan:  Routine general medical examination at a health care facility - Plan: CBC with Differential, Comprehensive metabolic panel, Lipid panel, Hemoglobin A1c, POCT urinalysis dipstick, IFOBT POC (occult bld, rslt in office), CANCELED: TSH, CANCELED: Vit D  25 hydroxy (rtn osteoporosis monitoring), CANCELED: EKG 12-Lead, CANCELED: IFOBT POC (occult bld, rslt in office)  1. Complete Physical Examination: anticipatory guidance --- weight loss, exercise. Pap smear UTD. Refer for mammogram.  Colonoscopy UTD.  Immunizations UTD; to check with insurance regarding Zostavax coverage.   2.  HTN: controlled; obtain labs; refills provided. 3. Hypercholesterolemia: obtain labs; intolerant to multiple statins. Tolerating Pravastatin moderately well. 4. Venous stasis: stable; reifll of Lasix provided.  Meds ordered this encounter  Medications  . amLODipine (NORVASC) 10 MG tablet    Sig: Take 1 tablet (10 mg total) by mouth daily.    Dispense:  90 tablet    Refill:  3  . furosemide (LASIX) 40 MG tablet    Sig: Take 1 tablet (40 mg total) by mouth daily.    Dispense:  90 tablet    Refill:  3  . DISCONTD: metoprolol succinate (TOPROL-XL) 100 MG 24 hr tablet    Sig: Take 1 tablet (100 mg total) by mouth daily. Take with or immediately following a meal.    Dispense:  90 tablet    Refill:  3  . DISCONTD: pravastatin (PRAVACHOL) 40 MG tablet    Sig: Take 1 tablet (40 mg total) by mouth daily.    Dispense:  90 tablet    Refill:  3   Nilda Simmer, M.D.  [facility] [address] [place], [place]  [address] [phone] phone [phone] fax

## 2013-02-05 LAB — HEMOGLOBIN A1C: Mean Plasma Glucose: 123 mg/dL — ABNORMAL HIGH (ref <117)

## 2013-04-10 ENCOUNTER — Ambulatory Visit: Payer: Self-pay | Admitting: General Surgery

## 2013-04-12 ENCOUNTER — Encounter: Payer: Self-pay | Admitting: General Surgery

## 2013-04-14 ENCOUNTER — Emergency Department (HOSPITAL_COMMUNITY)
Admission: EM | Admit: 2013-04-14 | Discharge: 2013-04-14 | Disposition: A | Discharge status: Home / Self Care | Payer: No Typology Code available for payment source | Attending: Emergency Medicine | Admitting: Emergency Medicine

## 2013-04-14 ENCOUNTER — Encounter (HOSPITAL_COMMUNITY): Payer: Self-pay | Admitting: Emergency Medicine

## 2013-04-14 DIAGNOSIS — L03211 Cellulitis of face: Secondary | ICD-10-CM | POA: Insufficient documentation

## 2013-04-14 DIAGNOSIS — IMO0002 Reserved for concepts with insufficient information to code with codable children: Secondary | ICD-10-CM | POA: Insufficient documentation

## 2013-04-14 DIAGNOSIS — IMO0001 Reserved for inherently not codable concepts without codable children: Secondary | ICD-10-CM | POA: Insufficient documentation

## 2013-04-14 DIAGNOSIS — I10 Essential (primary) hypertension: Secondary | ICD-10-CM | POA: Insufficient documentation

## 2013-04-14 DIAGNOSIS — Y929 Unspecified place or not applicable: Secondary | ICD-10-CM | POA: Insufficient documentation

## 2013-04-14 DIAGNOSIS — F17200 Nicotine dependence, unspecified, uncomplicated: Secondary | ICD-10-CM | POA: Insufficient documentation

## 2013-04-14 DIAGNOSIS — Z8679 Personal history of other diseases of the circulatory system: Secondary | ICD-10-CM | POA: Insufficient documentation

## 2013-04-14 DIAGNOSIS — M1990 Unspecified osteoarthritis, unspecified site: Secondary | ICD-10-CM | POA: Insufficient documentation

## 2013-04-14 DIAGNOSIS — Z79899 Other long term (current) drug therapy: Secondary | ICD-10-CM | POA: Insufficient documentation

## 2013-04-14 DIAGNOSIS — E785 Hyperlipidemia, unspecified: Secondary | ICD-10-CM | POA: Insufficient documentation

## 2013-04-14 DIAGNOSIS — Z888 Allergy status to other drugs, medicaments and biological substances status: Secondary | ICD-10-CM | POA: Insufficient documentation

## 2013-04-14 DIAGNOSIS — Y939 Activity, unspecified: Secondary | ICD-10-CM | POA: Insufficient documentation

## 2013-04-14 DIAGNOSIS — I7300 Raynaud's syndrome without gangrene: Secondary | ICD-10-CM | POA: Insufficient documentation

## 2013-04-14 DIAGNOSIS — D649 Anemia, unspecified: Secondary | ICD-10-CM | POA: Insufficient documentation

## 2013-04-14 DIAGNOSIS — N6019 Diffuse cystic mastopathy of unspecified breast: Secondary | ICD-10-CM | POA: Insufficient documentation

## 2013-04-14 DIAGNOSIS — M47817 Spondylosis without myelopathy or radiculopathy, lumbosacral region: Secondary | ICD-10-CM | POA: Insufficient documentation

## 2013-04-14 DIAGNOSIS — K5730 Diverticulosis of large intestine without perforation or abscess without bleeding: Secondary | ICD-10-CM | POA: Insufficient documentation

## 2013-04-14 DIAGNOSIS — Z880 Allergy status to penicillin: Secondary | ICD-10-CM | POA: Insufficient documentation

## 2013-04-14 DIAGNOSIS — G4700 Insomnia, unspecified: Secondary | ICD-10-CM | POA: Insufficient documentation

## 2013-04-14 DIAGNOSIS — L0201 Cutaneous abscess of face: Secondary | ICD-10-CM | POA: Insufficient documentation

## 2013-04-14 DIAGNOSIS — Z791 Long term (current) use of non-steroidal anti-inflammatories (NSAID): Secondary | ICD-10-CM | POA: Insufficient documentation

## 2013-04-14 DIAGNOSIS — R920 Mammographic microcalcification found on diagnostic imaging of breast: Secondary | ICD-10-CM | POA: Insufficient documentation

## 2013-04-14 MED ORDER — CLINDAMYCIN HCL 150 MG PO CAPS: 150.0000 mg | ORAL_CAPSULE | Freq: Four times a day (QID) | ORAL | Status: DC

## 2013-04-14 MED ORDER — CLINDAMYCIN HCL 150 MG PO CAPS
300.0000 mg | ORAL_CAPSULE | Freq: Once | ORAL | Status: AC
  Administered 2013-04-14: 300 mg via ORAL
  Filled 2013-04-14: qty 2

## 2013-04-14 NOTE — ED Notes (Signed)
PA asked pt if she wanted stay to be observed; pt wants to go home; PA aware. Pt reports she feels calmer.

## 2013-04-14 NOTE — ED Notes (Signed)
Pt. reports spider bite at right lower chin 4 days ago with no drainage , mild swelling/reddness.

## 2013-04-14 NOTE — ED Notes (Signed)
PT ambulated with baseline gait; VSS; A&Ox3; no signs of distress; respirations even and unlabored; skin warm and dry; no questions upon discharge.  

## 2013-04-14 NOTE — Discharge Instructions (Signed)
Cellulitis Cellulitis is an infection of the skin and the tissue beneath it. The infected area is usually red and tender. Cellulitis occurs most often in the arms and lower legs.  CAUSES  Cellulitis is caused by bacteria that enter the skin through cracks or cuts in the skin. The most common types of bacteria that cause cellulitis are Staphylococcus and Streptococcus. SYMPTOMS   Redness and warmth.  Swelling.  Tenderness or pain.  Fever. DIAGNOSIS  Your caregiver can usually determine what is wrong based on a physical exam. Blood tests may also be done. TREATMENT  Treatment usually involves taking an antibiotic medicine. HOME CARE INSTRUCTIONS   Take your antibiotics as directed. Finish them even if you start to feel better.  Keep the infected arm or leg elevated to reduce swelling.  Apply a warm cloth to the affected area up to 4 times per day to relieve pain.  Only take over-the-counter or prescription medicines for pain, discomfort, or fever as directed by your caregiver.  Keep all follow-up appointments as directed by your caregiver. SEEK MEDICAL CARE IF:   You notice red streaks coming from the infected area.  Your red area gets larger or turns dark in color.  Your bone or joint underneath the infected area becomes painful after the skin has healed.  Your infection returns in the same area or another area.  You notice a swollen bump in the infected area.  You develop new symptoms. SEEK IMMEDIATE MEDICAL CARE IF:   You have a fever.  You feel very sleepy.  You develop vomiting or diarrhea.  You have a general ill feeling (malaise) with muscle aches and pains. MAKE SURE YOU:   Understand these instructions.  Will watch your condition.  Will get help right away if you are not doing well or get worse. Document Released: 11/29/2004 Document Revised: 08/21/2011 Document Reviewed: 05/07/2011 [employer] Patient Information 2014 [employer].  
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
## 2013-04-14 NOTE — ED Notes (Signed)
Pt reports she feels "jittery" like she has too much coffee. PA aware.

## 2013-04-19 NOTE — ED Provider Notes (Signed)
Medical screening examination/treatment/procedure(s) were performed by non-physician practitioner and as supervising physician I was immediately available for consultation/collaboration.  EKG Interpretation   None         Temekia Caskey, MD 04/19/13 0708 

## 2013-04-22 ENCOUNTER — Ambulatory Visit: Payer: Self-pay | Admitting: General Surgery

## 2013-04-27 ENCOUNTER — Ambulatory Visit: Payer: Self-pay | Admitting: General Surgery

## 2013-05-05 ENCOUNTER — Ambulatory Visit: Payer: Self-pay | Admitting: General Surgery

## 2013-05-05 ENCOUNTER — Ambulatory Visit (INDEPENDENT_AMBULATORY_CARE_PROVIDER_SITE_OTHER): Payer: No Typology Code available for payment source | Admitting: General Surgery

## 2013-05-05 ENCOUNTER — Encounter: Payer: Self-pay | Admitting: General Surgery

## 2013-05-05 VITALS — BP 114/64 | HR 64 | Resp 12 | Ht 65.0 in | Wt 165.0 lb

## 2013-05-05 DIAGNOSIS — N6089 Other benign mammary dysplasias of unspecified breast: Secondary | ICD-10-CM

## 2013-05-05 DIAGNOSIS — N6099 Unspecified benign mammary dysplasia of unspecified breast: Secondary | ICD-10-CM
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
## 2013-05-05 NOTE — Patient Instructions (Signed)
Patient to return in 1 year bilateral screening mammogram. Patient to continue monthly self breast checks. She is advised to call the office with any questions or concerns.

## 2013-05-14 ENCOUNTER — Emergency Department (HOSPITAL_COMMUNITY): Payer: No Typology Code available for payment source

## 2013-05-14 ENCOUNTER — Ambulatory Visit (INDEPENDENT_AMBULATORY_CARE_PROVIDER_SITE_OTHER): Payer: No Typology Code available for payment source | Admitting: Emergency Medicine

## 2013-05-14 ENCOUNTER — Observation Stay (HOSPITAL_COMMUNITY)
Admission: EM | Admit: 2013-05-14 | Discharge: 2013-05-15 | Disposition: A | Discharge status: Home / Self Care | Payer: No Typology Code available for payment source | Attending: Family Medicine | Admitting: Family Medicine

## 2013-05-14 ENCOUNTER — Encounter (HOSPITAL_COMMUNITY): Payer: Self-pay | Admitting: Emergency Medicine

## 2013-05-14 VITALS — BP 150/70 | HR 80 | Temp 98.7°F | Resp 16 | Ht 64.0 in | Wt 163.4 lb

## 2013-05-14 DIAGNOSIS — E785 Hyperlipidemia, unspecified: Secondary | ICD-10-CM

## 2013-05-14 DIAGNOSIS — Z888 Allergy status to other drugs, medicaments and biological substances status: Secondary | ICD-10-CM | POA: Insufficient documentation

## 2013-05-14 DIAGNOSIS — G459 Transient cerebral ischemic attack, unspecified: Secondary | ICD-10-CM | POA: Insufficient documentation

## 2013-05-14 DIAGNOSIS — I10 Essential (primary) hypertension: Secondary | ICD-10-CM

## 2013-05-14 DIAGNOSIS — R110 Nausea: Secondary | ICD-10-CM

## 2013-05-14 DIAGNOSIS — M47817 Spondylosis without myelopathy or radiculopathy, lumbosacral region: Secondary | ICD-10-CM | POA: Insufficient documentation

## 2013-05-14 DIAGNOSIS — R079 Chest pain, unspecified: Secondary | ICD-10-CM

## 2013-05-14 DIAGNOSIS — Z8719 Personal history of other diseases of the digestive system: Secondary | ICD-10-CM | POA: Insufficient documentation

## 2013-05-14 DIAGNOSIS — F17200 Nicotine dependence, unspecified, uncomplicated: Secondary | ICD-10-CM | POA: Diagnosis present

## 2013-05-14 DIAGNOSIS — Z8742 Personal history of other diseases of the female genital tract: Secondary | ICD-10-CM | POA: Insufficient documentation

## 2013-05-14 DIAGNOSIS — Z880 Allergy status to penicillin: Secondary | ICD-10-CM | POA: Insufficient documentation

## 2013-05-14 DIAGNOSIS — M5030 Other cervical disc degeneration, unspecified cervical region: Secondary | ICD-10-CM | POA: Diagnosis present

## 2013-05-14 DIAGNOSIS — R0789 Other chest pain: Principal | ICD-10-CM | POA: Insufficient documentation

## 2013-05-14 DIAGNOSIS — G4700 Insomnia, unspecified: Secondary | ICD-10-CM | POA: Insufficient documentation

## 2013-05-14 DIAGNOSIS — Z881 Allergy status to other antibiotic agents status: Secondary | ICD-10-CM | POA: Insufficient documentation

## 2013-05-14 DIAGNOSIS — I7300 Raynaud's syndrome without gangrene: Secondary | ICD-10-CM | POA: Insufficient documentation

## 2013-05-14 DIAGNOSIS — Z79899 Other long term (current) drug therapy: Secondary | ICD-10-CM | POA: Insufficient documentation

## 2013-05-14 DIAGNOSIS — G542 Cervical root disorders, not elsewhere classified: Secondary | ICD-10-CM | POA: Diagnosis present

## 2013-05-14 DIAGNOSIS — M1990 Unspecified osteoarthritis, unspecified site: Secondary | ICD-10-CM | POA: Insufficient documentation

## 2013-05-14 DIAGNOSIS — R209 Unspecified disturbances of skin sensation: Secondary | ICD-10-CM | POA: Insufficient documentation

## 2013-05-14 DIAGNOSIS — E559 Vitamin D deficiency, unspecified: Secondary | ICD-10-CM | POA: Insufficient documentation

## 2013-05-14 LAB — COMPREHENSIVE METABOLIC PANEL
ALBUMIN: 3.7 g/dL (ref 3.5–5.2)
ALK PHOS: 104 U/L (ref 39–117)
ALT: 16 U/L (ref 0–35)
AST: 20 U/L (ref 0–37)
BUN: 10 mg/dL (ref 6–23)
CO2: 21 mEq/L (ref 19–32)
Calcium: 9.7 mg/dL (ref 8.4–10.5)
Chloride: 105 mEq/L (ref 96–112)
Creatinine, Ser: 0.65 mg/dL (ref 0.50–1.10)
GFR calc Af Amer: >90 mL/min (ref >90)
GFR calc non Af Amer: >90 mL/min (ref >90)
Glucose, Bld: 90 mg/dL (ref 70–99)
POTASSIUM: 3.8 meq/L (ref 3.7–5.3)
SODIUM: 143 meq/L (ref 137–147)
Total Bilirubin: 0.3 mg/dL (ref 0.3–1.2)
Total Protein: 7.2 g/dL (ref 6.0–8.3)

## 2013-05-14 LAB — CBC
HCT: 36.7 % (ref 36.0–46.0)
HEMATOCRIT: 36.2 % (ref 36.0–46.0)
HEMOGLOBIN: 12.3 g/dL (ref 12.0–15.0)
Hemoglobin: 12.4 g/dL (ref 12.0–15.0)
MCH: 27.3 pg (ref 26.0–34.0)
MCH: 27.4 pg (ref 26.0–34.0)
MCHC: 33.8 g/dL (ref 30.0–36.0)
MCHC: 34 g/dL (ref 30.0–36.0)
MCV: 80.8 fL (ref 78.0–100.0)
MCV: 80.6 fL (ref 78.0–100.0)
Platelets: 277 10*3/uL (ref 150–400)
Platelets: 284 10*3/uL (ref 150–400)
RBC: 4.54 MIL/uL (ref 3.87–5.11)
RBC: 4.49 MIL/uL (ref 3.87–5.11)
RDW: 16.4 % — AB (ref 11.5–15.5)
RDW: 16.5 % — ABNORMAL HIGH (ref 11.5–15.5)
WBC: 6.5 10*3/uL (ref 4.0–10.5)
WBC: 6.2 10*3/uL (ref 4.0–10.5)

## 2013-05-14 LAB — BASIC METABOLIC PANEL
BUN: 11 mg/dL (ref 6–23)
CO2: 22 meq/L (ref 19–32)
CREATININE: 0.66 mg/dL (ref 0.50–1.10)
Calcium: 9.5 mg/dL (ref 8.4–10.5)
Chloride: 103 mEq/L (ref 96–112)
Glucose, Bld: 91 mg/dL (ref 70–99)
Potassium: 3.7 mEq/L (ref 3.7–5.3)
SODIUM: 143 meq/L (ref 137–147)

## 2013-05-14 LAB — I-STAT TROPONIN, ED: TROPONIN I, POC: 0 ng/mL (ref 0.00–0.08)

## 2013-05-14 LAB — CREATININE, SERUM
Creatinine, Ser: 0.74 mg/dL (ref 0.50–1.10)
GFR calc Af Amer: >90 mL/min (ref >90)
GFR calc non Af Amer: 89 mL/min — ABNORMAL LOW (ref >90)

## 2013-05-14 LAB — APTT: APTT: 30 s (ref 24–37)

## 2013-05-14 LAB — TROPONIN I

## 2013-05-14 LAB — PROTIME-INR
INR: 0.97 (ref 0.00–1.49)
Prothrombin Time: 12.7 seconds (ref 11.6–15.2)

## 2013-05-14 MED ORDER — HEPARIN SODIUM (PORCINE) 5000 UNIT/ML IJ SOLN
5000.0000 [IU] | Freq: Three times a day (TID) | INTRAMUSCULAR | Status: DC
  Administered 2013-05-14 – 2013-05-15 (×2): 5000 [IU] via SUBCUTANEOUS
  Filled 2013-05-14 (×5): qty 1

## 2013-05-14 MED ORDER — METOPROLOL SUCCINATE ER 100 MG PO TB24
100.0000 mg | ORAL_TABLET | Freq: Every day | ORAL | Status: DC
  Administered 2013-05-14 – 2013-05-15 (×2): 100 mg via ORAL
  Filled 2013-05-14 (×2): qty 1

## 2013-05-14 MED ORDER — AMLODIPINE BESYLATE 10 MG PO TABS
10.0000 mg | ORAL_TABLET | Freq: Every day | ORAL | Status: DC
  Administered 2013-05-14 – 2013-05-15 (×2): 10 mg via ORAL
  Filled 2013-05-14 (×2): qty 1

## 2013-05-14 MED ORDER — PRAVASTATIN SODIUM 40 MG PO TABS
40.0000 mg | ORAL_TABLET | Freq: Every day | ORAL | Status: DC
  Administered 2013-05-14 – 2013-05-15 (×2): 40 mg via ORAL
  Filled 2013-05-14 (×2): qty 1

## 2013-05-14 MED ORDER — PANTOPRAZOLE SODIUM 40 MG PO TBEC
40.0000 mg | DELAYED_RELEASE_TABLET | Freq: Every day | ORAL | Status: DC
  Administered 2013-05-14 – 2013-05-15 (×2): 40 mg via ORAL
  Filled 2013-05-14: qty 1

## 2013-05-14 MED ORDER — ASPIRIN EC 325 MG PO TBEC
325.0000 mg | DELAYED_RELEASE_TABLET | Freq: Every day | ORAL | Status: DC
  Administered 2013-05-14 – 2013-05-15 (×2): 325 mg via ORAL
  Filled 2013-05-14 (×2): qty 1

## 2013-05-14 MED ORDER — SIMVASTATIN 20 MG PO TABS: 20.0000 mg | ORAL_TABLET | Freq: Every day | ORAL | Status: DC

## 2013-05-14 MED ORDER — FUROSEMIDE 40 MG PO TABS
40.0000 mg | ORAL_TABLET | Freq: Every day | ORAL | Status: DC
  Administered 2013-05-14 – 2013-05-15 (×2): 40 mg via ORAL
  Filled 2013-05-14 (×2): qty 1

## 2013-05-14 MED ORDER — ASPIRIN 81 MG PO CHEW
324.0000 mg | CHEWABLE_TABLET | Freq: Once | ORAL | Status: AC
  Administered 2013-05-14: 324 mg via ORAL

## 2013-05-14 NOTE — ED Notes (Signed)
Patient returned from CT

## 2013-05-14 NOTE — ED Provider Notes (Signed)
Patient [facility] in the Emergency Department [facility] Patient reports chest pain and numbness, all of which are resolved Exam : awake/alert, no arm/leg drift, no facial droop Plan: admit for chest pain workup and also possible TIA  tPA in stroke considered but not given due to:   Symptoms resolved     Joya Gaskinsonald W Ausencio Vaden, MD 05/14/13 1558
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
## 2013-05-14 NOTE — ED Notes (Signed)
Per [facility], pt from [facility] for left sided chest pressure that started intermittently since 0400 this morning. Nothing provokes it. States she has had some nausea with it. Pain radiates to the left arm and jaw. Has had 324 mg ASA, 1 NTG, 4 mg Zofran. Had a similar episode in Feb 2013 hospitalized and had a stress test, with no follow up. Has a 20g to LAC.

## 2013-05-14 NOTE — ED Notes (Signed)
Patient transported to X-ray 

## 2013-05-14 NOTE — Progress Notes (Signed)
   Subjective:    Patient ID: Judy Carlson, female    DOB: 1950-04-27, 63 y.o.   MRN: [id number] This chart was scribed for Judy ChrisSteven Malachi Kinzler, MD by Marica OtterNusrat Rahman, ED Scribe. This patient was seen in room 13 and the patient's care was started at 1:00PM.    SMITH,KRISTI, MD  HPI HPI Comments: Judy LegatoJoyce Carlson is a 63 y.o. female, with HTN and  HLD, who presents to the Emergency Department complaining of chest pain radiating to her right arm and to the right side of her face onset 4:00AM yesterday morning. Pt also complains of associated nausea and reports she produced some pink colored sputum. However, pt denies getting sweaty during these episodes. However, pt states she got very cold during the episodes although the temperature in the house was 80 degrees and it was warm outside. Pt states she has also been experiencing intermittent loss of balance and vision in her right eye. Pt reports she is a smoker and smokes approximately 7 cigarettes a day. Pt reports a family history of high blood pressure; pt states her mother and father had high blood pressure. Pt reports she is currently unemployed and states she has been experiencing above average stress as a consequence of it. Presently, pt states that she is feeling some "heaviness" in her chest and rates her current pain as a 1/1.5 out of 10. Pt denies taking aspirin every day.   Review of Systems  Constitutional: Negative for fatigue and unexpected weight change.  Eyes:       Intermittent loss of sight in her right eye  Respiratory: Negative for chest tightness and shortness of breath.   Cardiovascular: Positive for chest pain (Chest pain radiating to right side of body and face. Also produced pink sputum). Negative for palpitations and leg swelling.  Gastrointestinal: Positive for nausea. Negative for abdominal pain and blood in stool.  Neurological: Positive for dizziness (Intermittent loss of balance). Negative for syncope, light-headedness and  headaches.       Objective:   Physical Exam  Nursing note and vitals reviewed. CONSTITUTIONAL: Well developed/well nourished HEAD: Normocephalic/atraumatic EYES: EOMI/PERRL ENMT: Mucous membranes moist NECK: supple no meningeal signs SPINE:entire spine nontender CV: S1/S2 noted, no murmurs/rubs/gallops noted LUNGS: Lungs are clear to auscultation bilaterally, no apparent distress ABDOMEN: soft, nontender, no rebound or guarding GU:no cva tenderness NEURO: Pt is awake/alert, moves all extremitiesx4 EXTREMITIES: pulses normal, full ROM SKIN: warm, color normal PSYCH: no abnormalities of mood noted  Filed Vitals:   05/14/13 1244  BP: 118/64  Pulse: 80  Temp: 98.7 F (37.1 C)  TempSrc: Oral  Resp: 16  Height: 5\' 4"  (1.626 m)  Weight: 163 lb 6.4 oz (74.118 kg)  SpO2: 97%   EKG THERE are minor ST-T changes 3 and aVF         Assessment & Plan:   Patient presents with a classic history of unstable angina. She has high blood pressure high cholesterol and is a smoker. She has had 3 significant episodes of chest pain since yesterday morning. Worst episode of pain was approximately 30 hours ago. At the present time she has 1/10 pain discomfort. She is being transported to the hospital emergently for evaluation. She placed on O2. 4 baby aspirin given   IV was inserted.
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
## 2013-05-14 NOTE — ED Notes (Signed)
Patient transported to CT 

## 2013-05-14 NOTE — ED Notes (Signed)
Patient returned from X-ray. Phlebotomy at bedside collecting labwork.

## 2013-05-15 ENCOUNTER — Observation Stay (HOSPITAL_COMMUNITY): Payer: No Typology Code available for payment source

## 2013-05-15 DIAGNOSIS — E785 Hyperlipidemia, unspecified: Secondary | ICD-10-CM

## 2013-05-15 DIAGNOSIS — I059 Rheumatic mitral valve disease, unspecified: Secondary | ICD-10-CM

## 2013-05-15 DIAGNOSIS — G459 Transient cerebral ischemic attack, unspecified: Secondary | ICD-10-CM

## 2013-05-15 DIAGNOSIS — I10 Essential (primary) hypertension: Secondary | ICD-10-CM

## 2013-05-15 DIAGNOSIS — M5030 Other cervical disc degeneration, unspecified cervical region: Secondary | ICD-10-CM | POA: Diagnosis present

## 2013-05-15 DIAGNOSIS — G542 Cervical root disorders, not elsewhere classified: Secondary | ICD-10-CM | POA: Diagnosis present

## 2013-05-15 DIAGNOSIS — R079 Chest pain, unspecified: Secondary | ICD-10-CM

## 2013-05-15 LAB — BASIC METABOLIC PANEL
BUN: 9 mg/dL (ref 6–23)
CALCIUM: 9.2 mg/dL (ref 8.4–10.5)
CO2: 23 mEq/L (ref 19–32)
CREATININE: 0.64 mg/dL (ref 0.50–1.10)
Chloride: 104 mEq/L (ref 96–112)
GFR calc non Af Amer: >90 mL/min (ref >90)
Glucose, Bld: 85 mg/dL (ref 70–99)
Potassium: 3.6 mEq/L — ABNORMAL LOW (ref 3.7–5.3)
Sodium: 142 mEq/L (ref 137–147)

## 2013-05-15 LAB — GLUCOSE, CAPILLARY
GLUCOSE-CAPILLARY: 91 mg/dL (ref 70–99)
GLUCOSE-CAPILLARY: 94 mg/dL (ref 70–99)

## 2013-05-15 LAB — HEMOGLOBIN A1C
HEMOGLOBIN A1C: 5.8 % — AB (ref <5.7)
MEAN PLASMA GLUCOSE: 120 mg/dL — AB (ref <117)

## 2013-05-15 LAB — TROPONIN I

## 2013-05-15 MED ORDER — GABAPENTIN 100 MG PO CAPS
100.0000 mg | ORAL_CAPSULE | Freq: Two times a day (BID) | ORAL | Status: DC
  Filled 2013-05-15 (×2): qty 1

## 2013-05-15 MED ORDER — ASPIRIN 81 MG PO TBEC: 81.0000 mg | DELAYED_RELEASE_TABLET | Freq: Every day | ORAL | Status: DC

## 2013-05-15 MED ORDER — PANTOPRAZOLE SODIUM 40 MG PO TBEC: 40.0000 mg | DELAYED_RELEASE_TABLET | Freq: Every day | ORAL | Status: DC

## 2013-05-15 MED ORDER — GI COCKTAIL ~~LOC~~
30.0000 mL | Freq: Once | ORAL | Status: AC
  Administered 2013-05-15: 30 mL via ORAL
  Filled 2013-05-15: qty 30

## 2013-05-15 MED ORDER — NITROGLYCERIN 0.4 MG SL SUBL: 0.4000 mg | SUBLINGUAL_TABLET | SUBLINGUAL | Status: DC | PRN

## 2013-05-15 MED ORDER — GABAPENTIN 100 MG PO CAPS: 100.0000 mg | ORAL_CAPSULE | Freq: Two times a day (BID) | ORAL | Status: DC

## 2013-05-15 MED ORDER — ROSUVASTATIN CALCIUM 40 MG PO TABS: 40.0000 mg | ORAL_TABLET | Freq: Every day | ORAL | Status: DC

## 2013-05-15 NOTE — H&P (Signed)
Seen and exaqmined.  Discussed with Dr. Karie Schwalbe.  Agree with her management and documentation.  Briefly, 63 yo female with chest pain, left arm pain and numbness.  There was also some question of facial symptoms and maybe even gait problems.  As such she was admitted and we are going down both the CP, R/O MI pathway and TIA/Stroke pathway.  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  She has had this previously while in the [employer].  I think it is prudent to complete admit orders (still needs echo and carotid doppler) and likely DC this afternoon on neuropathic pain med (gabapentin).  Further WU, if needed, can be done as outpatient.

## 2013-05-15 NOTE — Progress Notes (Signed)
Family Medicine Teaching Service Daily Progress Note Intern Pager: [phone]  Patient name: Judy Carlson Medical record number: [id number] Date of birth: 1950-07-21 Age: 63 y.o. Gender: female  Primary Care Provider: Nilda SimmerSMITH,KRISTI, MD Consultants: none Code Status: dnr  Pt Overview and Major Events to Date:  3/12 - admitted for chest pain and arm numbness  Assessment and Plan: Judy LegatoJoyce Milliman is a 63 y.o. female presenting with chest pain, dyspnea, and arm numbness . PMH is significant for HTN, HLD, prediabetes, and tobacco use.   # Chest pain and dyspnea: - Trop neg x3, EKG stable - Continue home antihypertensives.  - continue aspirin 325mg  daily and statin - Continue PPI. Pain improved after GI cocktail   - Hold home mobic for now.   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   FEN/GI: Heart Healthy diet, SLIV  Prophylaxis: SQ heparin   Disposition: Pending completion of workup for TIA and chest pain.  Subjective: Feeling ok, still with some chest pain and arm tingling, reports pain was relieved by GI cocktail  Objective: Temp:  [97.2 F (36.2 C)-98.7 F (37.1 C)] 97.9 F (36.6 C) (03/13 0814) Pulse Rate:  [56-80] 56 (03/13 0814) Resp:  [13-19] 17 (03/13 0814) BP: (100-150)/(59-88) 110/66 mmHg (03/13 0814) SpO2:  [97 %-100 %] 98 % (03/13 0814) Weight:  [160 lb 12.8 oz (72.938 kg)-163 lb 6.4 oz (74.118 kg)] 160 lb 12.8 oz (72.938 kg) (03/13 0439) Physical Exam: General: NAD, pleasant  HEENT: AT/[place], sclera clear Cardiovascular: RRR, no m/r/g Respiratory:  CTAB, normal effort  Abdomen: Soft, nontender, nondistended  Extremities: Well-developed, no LE edema or calf tenderness  Skin: No rash or cyanosis  Neuro: Awake, alert, no focal deficits  Laboratory:  Recent Labs Lab 05/14/13 1415 05/14/13 2235  WBC 6.5 6.2  HGB 12.4 12.3  HCT 36.7 36.2  PLT 277 284    Recent Labs Lab 05/14/13 1415 05/14/13 1533 05/14/13 2235 05/15/13 0355  NA 143 143  --  142  K 3.7 3.8  --  3.6*  CL 103 105  --  104  CO2 22 21  --  23  BUN 11 10  --  9  CREATININE 0.66 0.65 0.74 0.64  CALCIUM 9.5 9.7  --  9.2  PROT  --  7.2  --   --   BILITOT  --  0.3  --   --   ALKPHOS  --  104  --   --   ALT  --  16  --   --   AST  --  20  --   --   GLUCOSE 91 90  --  85   Trop neg x3  Imaging/Diagnostic Tests: EKG: sinus rhythm, anteroseptal infarct old, borderline repol abnormality   CT head without contrast: No acute intracranial abnormality. Abnormal appearance to right eye likely related to stated ocular surgical history. Correlate with nature of surgical history.   DG chest 2 view: No active cardiopulmonary disease.  MRI/MRA: Normal MRI of the brain and normal intracranial MR angiography.  DG c-spine:  1. 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  2. Atherosclerotic plaque overlying the expected location the bilateral carotid bulbs. Further evaluation could be performed with carotid Doppler ultrasound as clinically indicated.  Beverely Low, MD 05/15/2013, 9:44 AM PGY-1, [facility] Family Medicine FPTS Intern pager: [phone], text pages welcome

## 2013-05-15 NOTE — Progress Notes (Signed)
Interval Progress Note  RN calling because patient is having same sharp chest pain and would like to try GI cocktail. She has stable vitals and troponin is neg x 1. Placed order for GI cocktail and nitroglycerin PRN.  Judy SingletonMaria T Elven Laboy, MD

## 2013-05-15 NOTE — Progress Notes (Signed)
  Echocardiogram 2D Echocardiogram has been performed.  Cathie BeamsGREGORY, Loral Campi 05/15/2013, 10:44 AM

## 2013-05-15 NOTE — Progress Notes (Signed)
FMTS Attending Daily Note:  Renold DonJeff Wing Schoch MD  [phone] pager  Family Practice pager:  [phone] I have discussed this patient with the resident Dr. Richarda BladeAdamo and attending physician Dr. Leveda AnnaHensel.  I agree with their findings, assessment, and care plan

## 2013-05-15 NOTE — Discharge Instructions (Signed)
Peripheral Neuropathy °Peripheral neuropathy is a type of nerve damage. It affects nerves that carry signals between the spinal cord and other parts of the body. These are called peripheral nerves. With peripheral neuropathy, one nerve or a group of nerves may be damaged.  °CAUSES  °Many things can damage peripheral nerves. For some people with peripheral neuropathy, the cause is unknown. Some causes include: °· Diabetes. This is the most common cause of peripheral neuropathy. °· Injury to a nerve. °· Pressure or stress on a nerve that lasts a long time. °· Too little vitamin B. Alcoholism can lead to this. °· Infections. °· Autoimmune diseases, such as multiple sclerosis and systemic lupus erythematosus. °· Inherited nerve diseases. °· Some medicines, such as cancer drugs. °· Toxic substances, such as lead and mercury. °· Too little blood flowing to the legs. °· Kidney disease. °· Thyroid disease. °SIGNS AND SYMPTOMS  °Different people have different symptoms. The symptoms you have will depend on which of your nerves is damaged.  Common symptoms include: °· Loss of feeling (numbness) in the feet and hands. °· Tingling in the feet and hands. °· Pain that burns. °· Very sensitive skin. °· Weakness. °· Not being able to move a part of the body (paralysis). °· Muscle twitching. °· Clumsiness or poor coordination. °· Loss of balance. °· Not being able to control your bladder. °· Feeling dizzy. °· Sexual problems. °DIAGNOSIS  °Peripheral neuropathy is a symptom, not a disease. Finding the cause of peripheral neuropathy can be hard. To figure that out, your health care provider will take a medical history and do a physical exam. A neurological exam will also be done. This involves checking things affected by your brain, spinal cord, and nerves (nervous system). For example, your health care provider will check your reflexes, how you move, and what you can feel.  °Other types of tests may also be ordered, such as: °· Blood  tests. °· A test of the fluid in your spinal cord. °· Imaging tests, such as CT scans or an MRI. °· Electromyography (EMG). This test checks the nerves that control muscles. °· Nerve conduction velocity tests. These tests check how fast messages pass through your nerves. °· Nerve biopsy. A small piece of nerve is removed. It is then checked under a microscope. °TREATMENT  °· Medicine is often used to treat peripheral neuropathy. Medicines may include: °· Pain-relieving medicines. Prescription or over-the-counter medicine may be suggested. °· Antiseizure medicine. This may be used for pain. °· Antidepressants. These also may help ease pain from neuropathy. °· Lidocaine. This is a numbing medicine. You might wear a patch or be given a shot. °· Mexiletine. This medicine is typically used to help control irregular heart rhythms. °· Surgery. Surgery may be needed to relieve pressure on a nerve or to destroy a nerve that is causing pain. °· Physical therapy to help movement. °· Assistive devices to help movement. °HOME CARE INSTRUCTIONS  °· Only take over-the-counter or prescription medicines as directed by your health care provider. Follow the instructions carefully for any given medicines. Do not take any other medicines without first getting approval from your health care provider. °· If you have diabetes, work closely with your health care provider to keep your blood sugar under control. °· If you have numbness in your feet: °· Check every day for signs of injury or infection. Watch for redness, warmth, and swelling. °· Wear padded socks and comfortable shoes. These help protect your feet. °· Do not do   things that put pressure on your damaged nerve.  Do not smoke. Smoking keeps blood from getting to damaged nerves.  Avoid or limit alcohol. Too much alcohol can cause a lack of B vitamins. These vitamins are needed for healthy nerves.  Develop a good support system. Coping with peripheral neuropathy can be  stressful. Talk to a mental health specialist or join a support group if you are struggling.  Follow up with your health care provider as directed. SEEK MEDICAL CARE IF:   You have new signs or symptoms of peripheral neuropathy.  You are struggling emotionally from dealing with peripheral neuropathy.  You have a fever. SEEK IMMEDIATE MEDICAL CARE IF:   You have an injury or infection that is not healing.  You feel very dizzy or begin vomiting.  You have chest pain.  You have trouble breathing. Document Released: 02/09/2002 Document Revised: 11/01/2010 Document Reviewed: 10/27/2012 [facility] Patient Information 2014 [place], [place]. STROKE/TIA DISCHARGE INSTRUCTIONS SMOKING Cigarette smoking nearly doubles your risk of having a stroke & is the single most alterable risk factor  If you smoke or have smoked in the last 12 months, you are advised to quit smoking for your health.  Most of the excess cardiovascular risk related to smoking disappears within a year of stopping.  Ask you doctor about anti-smoking medications  [place] Quit Line: 1-800-QUIT NOW  Free Smoking Cessation Classes [phone]  CHOLESTEROL Know your levels; limit fat & cholesterol in your diet  Lipid Panel     Component Value Date/Time   CHOL 250* 02/04/2013 1608   TRIG 92 02/04/2013 1608   HDL 86 02/04/2013 1608   HDL 102 08/04/2012 0913   CHOLHDL 2.9 02/04/2013 1608   CHOLHDL 2.4 08/04/2012 0913   VLDL 18 02/04/2013 1608   LDLCALC 146* 02/04/2013 1608   LDLCALC 134* 08/04/2012 0913      Many patients benefit from treatment even if their cholesterol is at goal.  Goal: Total Cholesterol (CHOL) less than 160  Goal:  Triglycerides (TRIG) less than 150  Goal:  HDL greater than 40  Goal:  LDL (LDLCALC) less than 100   BLOOD PRESSURE American Stroke Association blood pressure target is less that 120/80 mm/Hg  Your discharge blood pressure is:  BP: 120/65 mmHg  Monitor your blood pressure  Limit your salt and  alcohol intake  Many individuals will require more than one medication for high blood pressure  DIABETES (A1c is a blood sugar average for last 3 months) Goal HGBA1c is under 7% (HBGA1c is blood sugar average for last 3 months)  Diabetes: No known diagnosis of diabetes    Lab Results  Component Value Date   HGBA1C 5.9* 02/04/2013     Your HGBA1c can be lowered with medications, healthy diet, and exercise.  Check your blood sugar as directed by your physician  Call your physician if you experience unexplained or low blood sugars.  PHYSICAL ACTIVITY/REHABILITATION Goal is 30 minutes at least 4 days per week  Activity: No restrictions. Therapies: Return to work:   Activity decreases your risk of heart attack and stroke and makes your heart stronger.  It helps control your weight and blood pressure; helps you relax and can improve your mood.  Participate in a regular exercise program.  Talk with your doctor about the best form of exercise for you (dancing, walking, swimming, cycling).  DIET/WEIGHT Goal is to maintain a healthy weight  Your discharge diet is: Cardiac Your height is: 2023f 5i   Your  current weight is: Weight: 72.938 kg (160 lb 12.8 oz) Your Body Mass Index (BMI) is:     Following the type of diet specifically designed for you will help prevent another stroke.  Your goal weight range is:  114-155 lbs  Your goal Body Mass Index (BMI) is 19-24.  Healthy food habits can help reduce 3 risk factors for stroke:  High cholesterol, hypertension, and excess weight.  RESOURCES Stroke/Support Group:  Call [phone]   STROKE EDUCATION PROVIDED/REVIEWED AND GIVEN TO PATIENT Stroke warning signs and symptoms How to activate emergency medical system (call 911). Medications prescribed at discharge. Need for follow-up after discharge. Personal risk factors for stroke. Pneumonia vaccine given: No Flu vaccine given: No My questions have been answered, the writing is legible, and  I understand these instructions.  I will adhere to these goals & educational materials that have been provided to me after my discharge from the hospital.

## 2013-05-15 NOTE — Progress Notes (Signed)
*  PRELIMINARY RESULTS* Vascular Ultrasound Carotid Duplex (Doppler) has been completed.   Findings suggest 1-39% internal carotid artery stenosis bilaterally. Vertebral arteries are patent with antegrade flow.  05/15/2013 10:10 AM Gertie FeyMichelle Jadda Hunsucker, RVT, RDCS, RDMS

## 2013-05-15 NOTE — Progress Notes (Signed)
DC orders received.  Patient stable with no S/S of distress.  Medication and discharge information reviewed with patient.  Patient DC home. Judy Carlson  

## 2013-05-16 NOTE — ED Provider Notes (Signed)
I have personally seen and examined the patient.  I have discussed the plan of care with the resident.  I have reviewed the documentation on PMH/FH/Soc. History.  I have reviewed the documentation of the resident and agree.   Joya Gaskinsonald W Pandora Mccrackin, MD 05/16/13 [phone]

## 2013-05-17 NOTE — Discharge Summary (Signed)
Family Medicine Teaching [facility] Discharge Summary  Patient name: Judy Carlson Medical record number: [id number] Date of birth: 03-Mar-1951 Age: 63 y.o. Gender: female Date of Admission: 05/14/2013  Date of Discharge: 05/15/13 Admitting Physician: Sanjuana Letters, MD  Primary Care Provider: Nilda Simmer, MD Consultants: none  Indication for Hospitalization: chest pain, arm numbness  Discharge Diagnoses/Problem List:  Patient Active Problem List   Diagnosis Date Noted  . Degeneration of cervical intervertebral disc 05/15/2013  . Cervical nerve root impingement 05/15/2013  . Chest pain 05/14/2013  . Atypical ductal hyperplasia of breast 05/05/2013  . Unspecified vitamin D deficiency 08/04/2012  . Venous stasis 08/04/2012  . Need for prophylactic vaccination and inoculation against influenza 02/04/2012  . Essential hypertension, benign 02/04/2012  . Pure hypercholesterolemia 02/04/2012  . Vitamin D deficiency 02/04/2012  . Stress reaction 02/04/2012  . HYPERLIPIDEMIA 05/06/2007  . TOBACCO ABUSE 05/06/2007  . CHEST PAIN UNSPECIFIED 05/06/2007    Disposition: home  Discharge Condition: improved  Brief Hospital Course: Judy Carlson is a 63 y.o. female presenting with chest pain, dyspnea, and arm numbness . PMH is significant for HTN, HLD, prediabetes, and tobacco use.   # Chest pain and dyspnea: Pt's pain was atypical in nature and was relieved with a GI cocktail suggesting gastrointestinal etiology. She had multiple risk factors for cardiac disease so she was ruled out for acute coronary syndrome. Her EKG showed some nonspecific T-wave abnormalities but no findings concerning for acute ischemia and was unchanged on repeat the next morning. Her troponins were cycled and remained negative. Her risk stratification labs showed prediabetes and she was already known to be hyperlipidemic. Given her cardiac risk factors her statin therapy was changed to high intensity on  discharge and her aspirin and antihypertensives were continued.   # Left arm numbness/tingling: It was thought that her arm symptoms likely stemmed from some cervical degeneration but she received a TIA rule-out due to her multiple risk factors. Her head CT and MRI/MRA were normal. Echo and carotid dopplers were also normal and an xray of her cervical spine showed significant degenerative disease and likely nerve impingement, for which she was started on gabapentin.  Issues for Follow Up:  # Prediabetes: Counsel on lifestyle modifications and continue to monitor.  # Neuropathy/gabapentin: Monitor response to therapy and side-effects, titrate as indicated.  Significant Procedures: none  Significant Labs and Imaging:   Recent Labs Lab 05/14/13 1415 05/14/13 2235  WBC 6.5 6.2  HGB 12.4 12.3  HCT 36.7 36.2  PLT 277 284    Recent Labs Lab 05/14/13 1415 05/14/13 1533 05/14/13 2235 05/15/13 0355  NA 143 143  --  142  K 3.7 3.8  --  3.6*  CL 103 105  --  104  CO2 22 21  --  23  GLUCOSE 91 90  --  85  BUN 11 10  --  9  CREATININE 0.66 0.65 0.74 0.64  CALCIUM 9.5 9.7  --  9.2  ALKPHOS  --  104  --   --   AST  --  20  --   --   ALT  --  16  --   --   ALBUMIN  --  3.7  --   --    EKG: sinus rhythm, anteroseptal infarct old, borderline repol abnormality   CT head without contrast: No acute intracranial abnormality. Abnormal appearance to right eye likely related to stated ocular surgical history. Correlate with nature of surgical history.   DG chest 2  view: No active cardiopulmonary disease.   MRI/MRA: Normal MRI of the brain and normal intracranial MR angiography.   DG c-spine:  1. Moderate to severe multilevel cervical spine DDD, worse at C5-C6.  2. Atherosclerotic plaque overlying the expected location the bilateral carotid bulbs. Further evaluation could be performed with carotid Doppler ultrasound as clinically indicated.  Echocardiogram: Normal LV wall thickness with  LVEF 60-65%, normal dastolic function. Mild mitral regurgitation. Mildly thickened aortic valve without aortic regurgitation. Mild tricuspid regurgitation with normal PASP 24 mm mercury. No obvious ASD or PFO. No pericardial effusion.  Carotid dopplers: Findings suggest 1-39% internal carotid artery stenosis bilaterally. Vertebral arteries are patent with antegrade flow.   Results/Tests Pending at Time of Discharge: none  Discharge Medications:    Medication List    STOP taking these medications       pravastatin 40 MG tablet  Commonly known as:  PRAVACHOL      TAKE these medications       amLODipine 10 MG tablet  Commonly known as:  NORVASC  Take 1 tablet (10 mg total) by mouth daily.     aspirin 81 MG EC tablet  Take 1 tablet (81 mg total) by mouth daily.     furosemide 40 MG tablet  Commonly known as:  LASIX  Take 1 tablet (40 mg total) by mouth daily.     gabapentin 100 MG capsule  Commonly known as:  NEURONTIN  Take 1 capsule (100 mg total) by mouth 2 (two) times daily. Take at night if it makes you sleepy.     meloxicam 15 MG tablet  Commonly known as:  MOBIC  Take 15 mg by mouth daily.     metoprolol succinate 100 MG 24 hr tablet  Commonly known as:  TOPROL-XL  Take 1 tablet (100 mg total) by mouth daily. Take with or immediately following a meal.     pantoprazole 40 MG tablet  Commonly known as:  PROTONIX  Take 1 tablet (40 mg total) by mouth daily.     rosuvastatin 40 MG tablet  Commonly known as:  CRESTOR  Take 1 tablet (40 mg total) by mouth daily.     Vitamin D 2000 UNITS Caps  Take 2,000 Units by mouth daily.        Discharge Instructions: Please refer to Patient Instructions section of EMR for full details.  Patient was counseled important signs and symptoms that should prompt return to medical care, changes in medications, dietary instructions, activity restrictions, and follow up appointments.   Follow-Up Appointments:     Follow-up  Information   Follow up with SMITH,KRISTI, MD. Schedule an appointment as soon as possible for a visit in 1 week.   Specialty:  Family Medicine   Contact information:   [address] [place] [address] [phone]       Beverely LowElena Adamo, MD 05/17/2013, 10:09 AM PGY-1, [facility]

## 2013-05-19 ENCOUNTER — Ambulatory Visit (INDEPENDENT_AMBULATORY_CARE_PROVIDER_SITE_OTHER): Payer: No Typology Code available for payment source | Admitting: Family Medicine

## 2013-05-19 VITALS — HR 73 | Temp 98.2°F | Resp 16 | Ht 65.0 in | Wt 165.0 lb

## 2013-05-19 DIAGNOSIS — I10 Essential (primary) hypertension: Secondary | ICD-10-CM

## 2013-05-19 DIAGNOSIS — G542 Cervical root disorders, not elsewhere classified: Secondary | ICD-10-CM

## 2013-05-19 DIAGNOSIS — K219 Gastro-esophageal reflux disease without esophagitis: Secondary | ICD-10-CM

## 2013-05-19 DIAGNOSIS — E785 Hyperlipidemia, unspecified: Secondary | ICD-10-CM

## 2013-05-19 DIAGNOSIS — R079 Chest pain, unspecified: Secondary | ICD-10-CM

## 2013-05-19 DIAGNOSIS — M5030 Other cervical disc degeneration, unspecified cervical region: Secondary | ICD-10-CM

## 2013-05-19 DIAGNOSIS — M5412 Radiculopathy, cervical region: Secondary | ICD-10-CM
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
## 2013-05-20 NOTE — Discharge Summary (Signed)
Family Medicine Teaching Service  Discharge Note : Attending Jeff Pascuala Klutts MD Pager [phone] Inpatient Team Pager:  [phone]  I have reviewed this patient and the patient's chart and have discussed discharge planning with the resident at the time of discharge. I agree with the discharge plan as above.    

## 2013-06-10 ENCOUNTER — Telehealth: Payer: Self-pay

## 2013-06-10 NOTE — Telephone Encounter (Signed)
PT STATES SHE JUST GOT RELEASED FROM THE HOSPITAL AND WAS GIVEN SOME MEDICINE FOR STOMACH PAINS DIDN'T KNOW IF SHE NEEDED TO COME IN TO SEE  THE DR BECAUSE SHE IS STILL HAVE PAINS IN THE MIDDLE OF HER CHEST. PLEASE CALL [phone]

## 2013-06-10 NOTE — Telephone Encounter (Signed)
Spoke with pt. She is still having chest pains. Wants to know if she should RTC sooner that her appt on 5/4 or if there is anything you suggest. Thanks

## 2013-06-10 NOTE — Telephone Encounter (Signed)
Spoke with a female. He will have pt call back. Advise to RTC

## 2013-06-11 ENCOUNTER — Ambulatory Visit (INDEPENDENT_AMBULATORY_CARE_PROVIDER_SITE_OTHER): Payer: No Typology Code available for payment source | Admitting: Family Medicine

## 2013-06-11 VITALS — BP 118/66 | HR 74 | Temp 98.9°F | Resp 16 | Ht 64.7 in | Wt 166.6 lb

## 2013-06-11 DIAGNOSIS — R110 Nausea: Secondary | ICD-10-CM

## 2013-06-11 DIAGNOSIS — K921 Melena: Secondary | ICD-10-CM

## 2013-06-11 DIAGNOSIS — R079 Chest pain, unspecified: Secondary | ICD-10-CM

## 2013-06-11 DIAGNOSIS — R195 Other fecal abnormalities: Secondary | ICD-10-CM

## 2013-06-11 DIAGNOSIS — K219 Gastro-esophageal reflux disease without esophagitis: Secondary | ICD-10-CM

## 2013-06-11 LAB — POCT CBC
GRANULOCYTE PERCENT: 52.9 % (ref 37–80)
HEMATOCRIT: 38.7 % (ref 37.7–47.9)
Hemoglobin: 12.3 g/dL (ref 12.2–16.2)
Lymph, poc: 2.5 (ref 0.6–3.4)
MCH, POC: 27.1 pg (ref 27–31.2)
MCHC: 31.8 g/dL (ref 31.8–35.4)
MCV: 85.2 fL (ref 80–97)
MID (cbc): 0.7 (ref 0–0.9)
MPV: 8.3 fL (ref 0–99.8)
POC GRANULOCYTE: 3.6 (ref 2–6.9)
POC LYMPH PERCENT: 36.8 %L (ref 10–50)
POC MID %: 10.3 %M (ref 0–12)
Platelet Count, POC: 337 10*3/uL (ref 142–424)
RBC: 4.54 M/uL (ref 4.04–5.48)
RDW, POC: 16.7 %
WBC: 6.8 10*3/uL (ref 4.6–10.2)

## 2013-06-11 LAB — IFOBT (OCCULT BLOOD): IMMUNOLOGICAL FECAL OCCULT BLOOD TEST: NEGATIVE

## 2013-06-11 NOTE — Telephone Encounter (Signed)
Pt notified and will be making appt to follow up
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
## 2013-06-11 NOTE — Patient Instructions (Signed)

## 2013-06-11 NOTE — Telephone Encounter (Signed)
Recommend taking Prilosec or Prevacid OTC for possible reflux as cause of chest pain. Also recommend filling rx for Neurontin/Gabapentin that was prescribed at hospital follow-up for neck pain that may be radiating to chest.  Yes, I recommend sooner appointment with me; she can see me at 102 or schedule appointment at 104 in upcoming 1-2 weeks.

## 2013-06-12 LAB — COMPREHENSIVE METABOLIC PANEL
ALBUMIN: 3.8 g/dL (ref 3.5–5.2)
ALT: 15 U/L (ref 0–35)
AST: 14 U/L (ref 0–37)
Alkaline Phosphatase: 95 U/L (ref 39–117)
BUN: 19 mg/dL (ref 6–23)
CO2: 27 mEq/L (ref 19–32)
Calcium: 9.7 mg/dL (ref 8.4–10.5)
Chloride: 107 mEq/L (ref 96–112)
Creat: 0.85 mg/dL (ref 0.50–1.10)
GLUCOSE: 88 mg/dL (ref 70–99)
POTASSIUM: 4.3 meq/L (ref 3.5–5.3)
Sodium: 140 mEq/L (ref 135–145)
Total Bilirubin: 0.2 mg/dL (ref 0.2–1.2)
Total Protein: 6.4 g/dL (ref 6.0–8.3)

## 2013-07-06 ENCOUNTER — Ambulatory Visit (INDEPENDENT_AMBULATORY_CARE_PROVIDER_SITE_OTHER): Payer: No Typology Code available for payment source | Admitting: Family Medicine

## 2013-07-06 ENCOUNTER — Encounter: Payer: Self-pay | Admitting: Family Medicine

## 2013-07-06 VITALS — BP 130/66 | HR 71 | Temp 98.2°F | Resp 16 | Ht 64.0 in | Wt 165.8 lb

## 2013-07-06 DIAGNOSIS — R079 Chest pain, unspecified: Secondary | ICD-10-CM

## 2013-07-06 DIAGNOSIS — K219 Gastro-esophageal reflux disease without esophagitis: Secondary | ICD-10-CM

## 2013-07-06 DIAGNOSIS — E7800 Pure hypercholesterolemia, unspecified: Secondary | ICD-10-CM

## 2013-07-06 DIAGNOSIS — R042 Hemoptysis: Secondary | ICD-10-CM

## 2013-07-06 DIAGNOSIS — J309 Allergic rhinitis, unspecified: Secondary | ICD-10-CM

## 2013-07-06 LAB — CBC WITH DIFFERENTIAL/PLATELET
BASOS PCT: 0 % (ref 0–1)
Basophils Absolute: 0 10*3/uL (ref 0.0–0.1)
Eosinophils Absolute: 0.2 10*3/uL (ref 0.0–0.7)
Eosinophils Relative: 3 % (ref 0–5)
HEMATOCRIT: 38.5 % (ref 36.0–46.0)
HEMOGLOBIN: 13.1 g/dL (ref 12.0–15.0)
LYMPHS ABS: 2.7 10*3/uL (ref 0.7–4.0)
LYMPHS PCT: 48 % — AB (ref 12–46)
MCH: 27.6 pg (ref 26.0–34.0)
MCHC: 34 g/dL (ref 30.0–36.0)
MCV: 81.2 fL (ref 78.0–100.0)
MONO ABS: 0.7 10*3/uL (ref 0.1–1.0)
Monocytes Relative: 12 % (ref 3–12)
NEUTROS ABS: 2.1 10*3/uL (ref 1.7–7.7)
NEUTROS PCT: 37 % — AB (ref 43–77)
Platelets: 340 10*3/uL (ref 150–400)
RBC: 4.74 MIL/uL (ref 3.87–5.11)
RDW: 17.1 % — ABNORMAL HIGH (ref 11.5–15.5)
WBC: 5.6 10*3/uL (ref 4.0–10.5)
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
## 2013-07-06 NOTE — Patient Instructions (Addendum)
1. Take Claritin or Zyrtec 10mg  one tablet daily for two weeks. 2.  Call on Friday if still coughing up bloody mucous.

## 2013-07-07 LAB — LIPID PANEL
Cholesterol: 234 mg/dL — ABNORMAL HIGH (ref 0–200)
HDL: 79 mg/dL (ref >39)
LDL Cholesterol: 136 mg/dL — ABNORMAL HIGH (ref 0–99)
TRIGLYCERIDES: 95 mg/dL (ref <150)
Total CHOL/HDL Ratio: 3 Ratio
VLDL: 19 mg/dL (ref 0–40)

## 2013-07-13 ENCOUNTER — Telehealth: Payer: Self-pay

## 2013-07-13 NOTE — Telephone Encounter (Signed)
Dr Katrinka BlazingSmith.  Patient took the clairdon for five days.  She no longer has blood in her phlegm.  [phone]

## 2013-07-14 NOTE — Telephone Encounter (Signed)
Call --- received message from patient; please call her and thank her for the update.

## 2013-07-15 NOTE — Telephone Encounter (Signed)
LMVM that Dr. Katrinka BlazingSmith appreciated patient's update and if she has any further needs to please call us back.

## 2013-08-24 ENCOUNTER — Encounter (HOSPITAL_COMMUNITY): Payer: Self-pay | Admitting: Emergency Medicine

## 2013-08-24 ENCOUNTER — Emergency Department (HOSPITAL_COMMUNITY)
Admission: EM | Admit: 2013-08-24 | Discharge: 2013-08-25 | Disposition: A | Discharge status: Home / Self Care | Payer: No Typology Code available for payment source | Attending: Emergency Medicine | Admitting: Emergency Medicine

## 2013-08-24 DIAGNOSIS — Y939 Activity, unspecified: Secondary | ICD-10-CM | POA: Insufficient documentation

## 2013-08-24 DIAGNOSIS — E785 Hyperlipidemia, unspecified: Secondary | ICD-10-CM | POA: Insufficient documentation

## 2013-08-24 DIAGNOSIS — Z791 Long term (current) use of non-steroidal anti-inflammatories (NSAID): Secondary | ICD-10-CM | POA: Insufficient documentation

## 2013-08-24 DIAGNOSIS — E860 Dehydration: Secondary | ICD-10-CM | POA: Insufficient documentation

## 2013-08-24 DIAGNOSIS — F29 Unspecified psychosis not due to a substance or known physiological condition: Secondary | ICD-10-CM | POA: Insufficient documentation

## 2013-08-24 DIAGNOSIS — N289 Disorder of kidney and ureter, unspecified: Secondary | ICD-10-CM | POA: Insufficient documentation

## 2013-08-24 DIAGNOSIS — Z862 Personal history of diseases of the blood and blood-forming organs and certain disorders involving the immune mechanism: Secondary | ICD-10-CM | POA: Insufficient documentation

## 2013-08-24 DIAGNOSIS — Z8742 Personal history of other diseases of the female genital tract: Secondary | ICD-10-CM | POA: Insufficient documentation

## 2013-08-24 DIAGNOSIS — I10 Essential (primary) hypertension: Secondary | ICD-10-CM | POA: Insufficient documentation

## 2013-08-24 DIAGNOSIS — Z7982 Long term (current) use of aspirin: Secondary | ICD-10-CM | POA: Insufficient documentation

## 2013-08-24 DIAGNOSIS — F17200 Nicotine dependence, unspecified, uncomplicated: Secondary | ICD-10-CM | POA: Insufficient documentation

## 2013-08-24 DIAGNOSIS — M1990 Unspecified osteoarthritis, unspecified site: Secondary | ICD-10-CM | POA: Insufficient documentation

## 2013-08-24 DIAGNOSIS — Y929 Unspecified place or not applicable: Secondary | ICD-10-CM | POA: Insufficient documentation

## 2013-08-24 DIAGNOSIS — F911 Conduct disorder, childhood-onset type: Secondary | ICD-10-CM | POA: Insufficient documentation

## 2013-08-24 DIAGNOSIS — Z8719 Personal history of other diseases of the digestive system: Secondary | ICD-10-CM | POA: Insufficient documentation

## 2013-08-24 DIAGNOSIS — R45851 Suicidal ideations: Secondary | ICD-10-CM | POA: Insufficient documentation

## 2013-08-24 DIAGNOSIS — E559 Vitamin D deficiency, unspecified: Secondary | ICD-10-CM | POA: Insufficient documentation

## 2013-08-24 DIAGNOSIS — X58XXXA Exposure to other specified factors, initial encounter: Secondary | ICD-10-CM | POA: Insufficient documentation

## 2013-08-24 DIAGNOSIS — IMO0002 Reserved for concepts with insufficient information to code with codable children: Secondary | ICD-10-CM | POA: Insufficient documentation

## 2013-08-24 DIAGNOSIS — Z79899 Other long term (current) drug therapy: Secondary | ICD-10-CM | POA: Insufficient documentation

## 2013-08-24 DIAGNOSIS — Z880 Allergy status to penicillin: Secondary | ICD-10-CM | POA: Insufficient documentation

## 2013-08-24 DIAGNOSIS — R509 Fever, unspecified: Secondary | ICD-10-CM | POA: Insufficient documentation

## 2013-08-24 LAB — BASIC METABOLIC PANEL
BUN: 14 mg/dL (ref 6–23)
CO2: 23 mEq/L (ref 19–32)
Calcium: 9.4 mg/dL (ref 8.4–10.5)
Chloride: 108 mEq/L (ref 96–112)
Creatinine, Ser: 1.11 mg/dL — ABNORMAL HIGH (ref 0.50–1.10)
GFR, EST AFRICAN AMERICAN: 60 mL/min — AB (ref >90)
GFR, EST NON AFRICAN AMERICAN: 52 mL/min — AB (ref >90)
Glucose, Bld: 99 mg/dL (ref 70–99)
Potassium: 3.7 mEq/L (ref 3.7–5.3)
Sodium: 144 mEq/L (ref 137–147)

## 2013-08-24 LAB — COMPREHENSIVE METABOLIC PANEL
ALBUMIN: 4.4 g/dL (ref 3.5–5.2)
ALK PHOS: 124 U/L — AB (ref 39–117)
ALT: 14 U/L (ref 0–35)
AST: 19 U/L (ref 0–37)
BILIRUBIN TOTAL: 0.3 mg/dL (ref 0.3–1.2)
BUN: 14 mg/dL (ref 6–23)
CHLORIDE: 101 meq/L (ref 96–112)
CO2: 23 mEq/L (ref 19–32)
CREATININE: 1.52 mg/dL — AB (ref 0.50–1.10)
Calcium: 10.3 mg/dL (ref 8.4–10.5)
GFR calc Af Amer: 41 mL/min — ABNORMAL LOW (ref >90)
GFR, EST NON AFRICAN AMERICAN: 35 mL/min — AB (ref >90)
Glucose, Bld: 106 mg/dL — ABNORMAL HIGH (ref 70–99)
POTASSIUM: 3.5 meq/L — AB (ref 3.7–5.3)
Sodium: 140 mEq/L (ref 137–147)
Total Protein: 8.3 g/dL (ref 6.0–8.3)

## 2013-08-24 LAB — CBC
HCT: 41.1 % (ref 36.0–46.0)
HEMOGLOBIN: 14.1 g/dL (ref 12.0–15.0)
MCH: 27.2 pg (ref 26.0–34.0)
MCHC: 34.3 g/dL (ref 30.0–36.0)
MCV: 79.2 fL (ref 78.0–100.0)
Platelets: 334 10*3/uL (ref 150–400)
RBC: 5.19 MIL/uL — ABNORMAL HIGH (ref 3.87–5.11)
RDW: 15.4 % (ref 11.5–15.5)
WBC: 7.3 10*3/uL (ref 4.0–10.5)

## 2013-08-24 LAB — RAPID URINE DRUG SCREEN, HOSP PERFORMED
Amphetamines: NOT DETECTED
BARBITURATES: NOT DETECTED
BENZODIAZEPINES: NOT DETECTED
Cocaine: NOT DETECTED
Opiates: NOT DETECTED
Tetrahydrocannabinol: NOT DETECTED

## 2013-08-24 LAB — CK: CK TOTAL: 112 U/L (ref 7–177)

## 2013-08-24 LAB — SALICYLATE LEVEL: Salicylate Lvl: <2 mg/dL — ABNORMAL LOW (ref 2.8–20.0)

## 2013-08-24 LAB — ACETAMINOPHEN LEVEL

## 2013-08-24 LAB — ETHANOL

## 2013-08-24 MED ORDER — SODIUM CHLORIDE 0.9 % IV SOLN: 1000.0000 mL | INTRAVENOUS | Status: DC

## 2013-08-24 MED ORDER — SODIUM CHLORIDE 0.9 % IV SOLN
1000.0000 mL | Freq: Once | INTRAVENOUS | Status: AC
  Administered 2013-08-24: 1000 mL via INTRAVENOUS

## 2013-08-24 MED ORDER — SODIUM CHLORIDE 0.9 % IV BOLUS (SEPSIS)
1000.0000 mL | Freq: Once | INTRAVENOUS | Status: AC
  Administered 2013-08-24: 1000 mL via INTRAVENOUS

## 2013-08-24 NOTE — ED Notes (Signed)
Patient refusing to speak to tech but cooperating with vitals.

## 2013-08-24 NOTE — ED Notes (Signed)
Pt IVC, brought in by [facility]. Pt is IVC'd due to wanting to harm self with guns. Pt [facility] or here in ED. [facility] once [facility].

## 2013-08-24 NOTE — ED Notes (Signed)
Bed: [facility]3 Expected date:  Expected time:  Means of arrival:  Comments: EMS- SI, huffing canned air
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
## 2013-08-24 NOTE — ED Notes (Signed)
Patient stated she "had no urine to give". This tech notified the DR. Dr ok 'd water for patient. This tech notified patient that if she didn't go voluntarily that we would have to cath her.

## 2013-08-27 ENCOUNTER — Emergency Department (HOSPITAL_COMMUNITY)
Admission: EM | Admit: 2013-08-27 | Discharge: 2013-08-27 | Disposition: A | Discharge status: Home / Self Care | Payer: No Typology Code available for payment source | Attending: Emergency Medicine | Admitting: Emergency Medicine

## 2013-08-27 ENCOUNTER — Encounter (HOSPITAL_COMMUNITY): Payer: Self-pay | Admitting: Emergency Medicine

## 2013-08-27 ENCOUNTER — Emergency Department (HOSPITAL_COMMUNITY): Payer: No Typology Code available for payment source

## 2013-08-27 DIAGNOSIS — Z7982 Long term (current) use of aspirin: Secondary | ICD-10-CM | POA: Insufficient documentation

## 2013-08-27 DIAGNOSIS — Z79899 Other long term (current) drug therapy: Secondary | ICD-10-CM | POA: Insufficient documentation

## 2013-08-27 DIAGNOSIS — Z8719 Personal history of other diseases of the digestive system: Secondary | ICD-10-CM | POA: Insufficient documentation

## 2013-08-27 DIAGNOSIS — E559 Vitamin D deficiency, unspecified: Secondary | ICD-10-CM | POA: Insufficient documentation

## 2013-08-27 DIAGNOSIS — I10 Essential (primary) hypertension: Secondary | ICD-10-CM | POA: Insufficient documentation

## 2013-08-27 DIAGNOSIS — Z880 Allergy status to penicillin: Secondary | ICD-10-CM | POA: Insufficient documentation

## 2013-08-27 DIAGNOSIS — Z791 Long term (current) use of non-steroidal anti-inflammatories (NSAID): Secondary | ICD-10-CM | POA: Insufficient documentation

## 2013-08-27 DIAGNOSIS — F17200 Nicotine dependence, unspecified, uncomplicated: Secondary | ICD-10-CM | POA: Insufficient documentation

## 2013-08-27 DIAGNOSIS — R45851 Suicidal ideations: Secondary | ICD-10-CM | POA: Insufficient documentation

## 2013-08-27 DIAGNOSIS — Z8742 Personal history of other diseases of the female genital tract: Secondary | ICD-10-CM | POA: Insufficient documentation

## 2013-08-27 DIAGNOSIS — M1990 Unspecified osteoarthritis, unspecified site: Secondary | ICD-10-CM | POA: Insufficient documentation

## 2013-08-27 DIAGNOSIS — E785 Hyperlipidemia, unspecified: Secondary | ICD-10-CM | POA: Insufficient documentation

## 2013-08-27 DIAGNOSIS — Z862 Personal history of diseases of the blood and blood-forming organs and certain disorders involving the immune mechanism: Secondary | ICD-10-CM | POA: Insufficient documentation

## 2013-08-27 LAB — RAPID URINE DRUG SCREEN, HOSP PERFORMED
Amphetamines: NOT DETECTED
Barbiturates: NOT DETECTED
Benzodiazepines: NOT DETECTED
Cocaine: NOT DETECTED
Opiates: NOT DETECTED
Tetrahydrocannabinol: NOT DETECTED

## 2013-08-27 NOTE — Discharge Instructions (Signed)
Return here as needed. °

## 2013-08-27 NOTE — ED Notes (Signed)
Pt alert, nad, resp even unlabored, skin pwd, denies needs, ambulates to discharge with officer

## 2013-08-27 NOTE — ED Notes (Signed)
Contacted [facility], instructed to send copy of results and ROS, back with pt via the officer present in room

## 2013-08-27 NOTE — ED Notes (Signed)
Pt alert, [facility], pt currently under IVC, presents for EKG, UDS, and Chest Xray, resp even unlabored, skin pwd, officer bedside
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
## 2013-08-27 NOTE — ED Provider Notes (Signed)
Medical screening examination/treatment/procedure(s) were performed by non-physician practitioner and as supervising physician I was immediately available for consultation/collaboration.   EKG Interpretation None        Richardean Canalavid H Yao, MD 08/27/13 1544

## 2013-09-01 ENCOUNTER — Ambulatory Visit (INDEPENDENT_AMBULATORY_CARE_PROVIDER_SITE_OTHER): Payer: No Typology Code available for payment source | Admitting: Family Medicine

## 2013-09-01 VITALS — BP 130/77 | HR 85 | Temp 98.5°F | Resp 16 | Ht 64.0 in | Wt 163.4 lb

## 2013-09-01 DIAGNOSIS — F329 Major depressive disorder, single episode, unspecified: Secondary | ICD-10-CM

## 2013-09-01 DIAGNOSIS — F32A Depression, unspecified: Secondary | ICD-10-CM

## 2013-09-01 DIAGNOSIS — F3289 Other specified depressive episodes: Secondary | ICD-10-CM

## 2013-09-01 DIAGNOSIS — R45851 Suicidal ideations: Secondary | ICD-10-CM

## 2013-09-01 DIAGNOSIS — N289 Disorder of kidney and ureter, unspecified: Secondary | ICD-10-CM

## 2013-09-01 MED ORDER — METOPROLOL SUCCINATE ER 100 MG PO TB24: 100.0000 mg | ORAL_TABLET | Freq: Every day | ORAL | Status: DC
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
## 2013-09-02 LAB — COMPREHENSIVE METABOLIC PANEL
ALBUMIN: 4.5 g/dL (ref 3.5–5.2)
ALT: 15 U/L (ref 0–35)
AST: 17 U/L (ref 0–37)
Alkaline Phosphatase: 101 U/L (ref 39–117)
BUN: 15 mg/dL (ref 6–23)
CALCIUM: 9.9 mg/dL (ref 8.4–10.5)
CHLORIDE: 103 meq/L (ref 96–112)
CO2: 25 meq/L (ref 19–32)
CREATININE: 0.8 mg/dL (ref 0.50–1.10)
Glucose, Bld: 94 mg/dL (ref 70–99)
POTASSIUM: 3.6 meq/L (ref 3.5–5.3)
Sodium: 140 mEq/L (ref 135–145)
Total Bilirubin: 0.3 mg/dL (ref 0.2–1.2)
Total Protein: 7.4 g/dL (ref 6.0–8.3)

## 2013-09-02 LAB — CBC WITH DIFFERENTIAL/PLATELET
Basophils Absolute: 0 10*3/uL (ref 0.0–0.1)
Basophils Relative: 0 % (ref 0–1)
EOS PCT: 2 % (ref 0–5)
Eosinophils Absolute: 0.1 10*3/uL (ref 0.0–0.7)
HCT: 39.5 % (ref 36.0–46.0)
Hemoglobin: 13.5 g/dL (ref 12.0–15.0)
LYMPHS ABS: 2.8 10*3/uL (ref 0.7–4.0)
Lymphocytes Relative: 39 % (ref 12–46)
MCH: 26.8 pg (ref 26.0–34.0)
MCHC: 34.2 g/dL (ref 30.0–36.0)
MCV: 78.4 fL (ref 78.0–100.0)
Monocytes Absolute: 0.9 10*3/uL (ref 0.1–1.0)
Monocytes Relative: 13 % — ABNORMAL HIGH (ref 3–12)
Neutro Abs: 3.3 10*3/uL (ref 1.7–7.7)
Neutrophils Relative %: 46 % (ref 43–77)
Platelets: 338 10*3/uL (ref 150–400)
RBC: 5.04 MIL/uL (ref 3.87–5.11)
RDW: 15.8 % — ABNORMAL HIGH (ref 11.5–15.5)
WBC: 7.2 10*3/uL (ref 4.0–10.5)

## 2013-09-02 LAB — TSH: TSH: 1.1 u[IU]/mL (ref 0.350–4.500)

## 2013-09-02 LAB — VITAMIN B12: VITAMIN B 12: 625 pg/mL (ref 211–911)

## 2013-09-03 LAB — VITAMIN D 25 HYDROXY (VIT D DEFICIENCY, FRACTURES): Vit D, 25-Hydroxy: 60 ng/mL (ref 30–89)

## 2013-09-10 ENCOUNTER — Encounter: Payer: Self-pay | Admitting: Radiology

## 2013-11-27 ENCOUNTER — Other Ambulatory Visit: Payer: Self-pay | Admitting: Emergency Medicine

## 2013-11-30 NOTE — Telephone Encounter (Signed)
Dr Katrinka Blazing, you saw pt in June but not for this med. Do you want to give RFs?

## 2014-01-06 ENCOUNTER — Ambulatory Visit: Payer: No Typology Code available for payment source | Admitting: Family Medicine

## 2014-01-25 ENCOUNTER — Encounter: Payer: Self-pay | Admitting: Family Medicine

## 2014-01-25 ENCOUNTER — Ambulatory Visit (INDEPENDENT_AMBULATORY_CARE_PROVIDER_SITE_OTHER): Payer: No Typology Code available for payment source | Admitting: Family Medicine

## 2014-01-25 VITALS — BP 124/76 | HR 69 | Temp 98.3°F | Resp 16 | Ht 64.25 in | Wt 166.4 lb

## 2014-01-25 DIAGNOSIS — R7302 Impaired glucose tolerance (oral): Secondary | ICD-10-CM

## 2014-01-25 DIAGNOSIS — E785 Hyperlipidemia, unspecified: Secondary | ICD-10-CM

## 2014-01-25 DIAGNOSIS — Z23 Encounter for immunization: Secondary | ICD-10-CM

## 2014-01-25 DIAGNOSIS — F32A Depression, unspecified: Secondary | ICD-10-CM

## 2014-01-25 DIAGNOSIS — F329 Major depressive disorder, single episode, unspecified: Secondary | ICD-10-CM

## 2014-01-25 DIAGNOSIS — I10 Essential (primary) hypertension: Secondary | ICD-10-CM

## 2014-01-25 DIAGNOSIS — K5900 Constipation, unspecified: Secondary | ICD-10-CM

## 2014-01-25 LAB — LIPID PANEL
CHOL/HDL RATIO: 2.6 ratio
Cholesterol: 190 mg/dL (ref 0–200)
HDL: 74 mg/dL (ref >39)
LDL Cholesterol: 103 mg/dL — ABNORMAL HIGH (ref 0–99)
TRIGLYCERIDES: 66 mg/dL (ref <150)
VLDL: 13 mg/dL (ref 0–40)

## 2014-01-25 LAB — COMPREHENSIVE METABOLIC PANEL
ALK PHOS: 100 U/L (ref 39–117)
ALT: 17 U/L (ref 0–35)
AST: 17 U/L (ref 0–37)
Albumin: 3.8 g/dL (ref 3.5–5.2)
BILIRUBIN TOTAL: 0.3 mg/dL (ref 0.2–1.2)
BUN: 18 mg/dL (ref 6–23)
CO2: 26 mEq/L (ref 19–32)
CREATININE: 0.97 mg/dL (ref 0.50–1.10)
Calcium: 9.7 mg/dL (ref 8.4–10.5)
Chloride: 107 mEq/L (ref 96–112)
Glucose, Bld: 82 mg/dL (ref 70–99)
Potassium: 4.1 mEq/L (ref 3.5–5.3)
Sodium: 142 mEq/L (ref 135–145)
Total Protein: 6.8 g/dL (ref 6.0–8.3)

## 2014-01-25 LAB — CBC WITH DIFFERENTIAL/PLATELET
BASOS ABS: 0 10*3/uL (ref 0.0–0.1)
BASOS PCT: 1 % (ref 0–1)
EOS ABS: 0.1 10*3/uL (ref 0.0–0.7)
EOS PCT: 2 % (ref 0–5)
HCT: 37.5 % (ref 36.0–46.0)
Hemoglobin: 12.4 g/dL (ref 12.0–15.0)
Lymphocytes Relative: 36 % (ref 12–46)
Lymphs Abs: 1.8 10*3/uL (ref 0.7–4.0)
MCH: 26.9 pg (ref 26.0–34.0)
MCHC: 33.1 g/dL (ref 30.0–36.0)
MCV: 81.3 fL (ref 78.0–100.0)
MONO ABS: 0.6 10*3/uL (ref 0.1–1.0)
MONOS PCT: 12 % (ref 3–12)
MPV: 9.8 fL (ref 9.4–12.4)
NEUTROS ABS: 2.4 10*3/uL (ref 1.7–7.7)
Neutrophils Relative %: 49 % (ref 43–77)
PLATELETS: 318 10*3/uL (ref 150–400)
RBC: 4.61 MIL/uL (ref 3.87–5.11)
RDW: 17.1 % — AB (ref 11.5–15.5)
WBC: 4.9 10*3/uL (ref 4.0–10.5)

## 2014-01-25 LAB — HEMOGLOBIN A1C
Hgb A1c MFr Bld: 5.7 % — ABNORMAL HIGH (ref <5.7)
MEAN PLASMA GLUCOSE: 117 mg/dL — AB (ref <117)
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
## 2014-01-30 NOTE — Progress Notes (Signed)
History and physical examinations obtained with Tishira Brewington, PA-C.  S:  Doing well emotionally; in a much better place than last visit; non-compliant with follow-up with psychiatry and psychology after last visit; also non-compliant with six week follow-up with me after last visit; feels much better thus did not feel the need to keep appointments.  Compliant with blood pressure medications; blood pressures running "good" when she checks at home; denies CP/palp/SOB/leg swelling.  Compliant with Pravastatin for cholesterol as well; denies side effects to medications. Denies HA/dizziness/focal weakness/paresthesias. No recent neck pain or radicular symptoms.  O: CV: RRR; no m/g/r.  Lungs: CTA B.  Abd: soft,NT, ND, BSNA.  Ext: trace non-pitting edema.  A/P: Anxiety and depression: stable/improved; non-compliant with any medication at this time and doing well.  HTN: controlled; obtain labs; continue current medications.  Hyperlipidemia: stable; obtain labs; continue current medications. S/p flu vaccine.  Glucose intolerance: obtain labs.  Constipation; New/recurrent; add Metamucil or Miralax daily.  RTC six months for CPE.

## 2014-02-13 ENCOUNTER — Other Ambulatory Visit: Payer: Self-pay | Admitting: Family Medicine

## 2014-02-27 ENCOUNTER — Other Ambulatory Visit: Payer: Self-pay | Admitting: Family Medicine

## 2014-03-11 ENCOUNTER — Encounter: Payer: Self-pay | Admitting: Gastroenterology

## 2014-03-29 ENCOUNTER — Other Ambulatory Visit: Payer: Self-pay | Admitting: Family Medicine

## 2014-05-04 ENCOUNTER — Ambulatory Visit: Payer: No Typology Code available for payment source | Admitting: General Surgery

## 2014-06-19 ENCOUNTER — Other Ambulatory Visit: Payer: Self-pay | Admitting: Family Medicine

## 2014-06-19 ENCOUNTER — Other Ambulatory Visit: Payer: Self-pay | Admitting: Physician Assistant

## 2014-07-01 ENCOUNTER — Encounter: Payer: Self-pay | Admitting: *Deleted

## 2014-07-19 ENCOUNTER — Other Ambulatory Visit: Payer: Self-pay | Admitting: Family Medicine

## 2014-07-28 ENCOUNTER — Ambulatory Visit (INDEPENDENT_AMBULATORY_CARE_PROVIDER_SITE_OTHER): Payer: No Typology Code available for payment source | Admitting: Family Medicine

## 2014-07-28 ENCOUNTER — Encounter: Payer: Self-pay | Admitting: Family Medicine

## 2014-07-28 VITALS — BP 106/68 | HR 68 | Temp 98.3°F | Resp 16 | Ht 64.5 in | Wt 165.0 lb

## 2014-07-28 DIAGNOSIS — Z720 Tobacco use: Secondary | ICD-10-CM | POA: Diagnosis not present

## 2014-07-28 DIAGNOSIS — I878 Other specified disorders of veins: Secondary | ICD-10-CM | POA: Diagnosis not present

## 2014-07-28 DIAGNOSIS — N62 Hypertrophy of breast: Secondary | ICD-10-CM

## 2014-07-28 DIAGNOSIS — M5030 Other cervical disc degeneration, unspecified cervical region: Secondary | ICD-10-CM | POA: Diagnosis not present

## 2014-07-28 DIAGNOSIS — E559 Vitamin D deficiency, unspecified: Secondary | ICD-10-CM

## 2014-07-28 DIAGNOSIS — F419 Anxiety disorder, unspecified: Secondary | ICD-10-CM

## 2014-07-28 DIAGNOSIS — F32A Depression, unspecified: Secondary | ICD-10-CM

## 2014-07-28 DIAGNOSIS — I10 Essential (primary) hypertension: Secondary | ICD-10-CM

## 2014-07-28 DIAGNOSIS — E7800 Pure hypercholesterolemia, unspecified: Secondary | ICD-10-CM

## 2014-07-28 DIAGNOSIS — Z23 Encounter for immunization: Secondary | ICD-10-CM | POA: Diagnosis not present

## 2014-07-28 DIAGNOSIS — Z0000 Encounter for general adult medical examination without abnormal findings: Secondary | ICD-10-CM | POA: Diagnosis not present

## 2014-07-28 DIAGNOSIS — F418 Other specified anxiety disorders: Secondary | ICD-10-CM | POA: Diagnosis not present

## 2014-07-28 DIAGNOSIS — F329 Major depressive disorder, single episode, unspecified: Secondary | ICD-10-CM

## 2014-07-28 DIAGNOSIS — F17200 Nicotine dependence, unspecified, uncomplicated: Secondary | ICD-10-CM

## 2014-07-28 DIAGNOSIS — N6099 Unspecified benign mammary dysplasia of unspecified breast: Secondary | ICD-10-CM

## 2014-07-28 DIAGNOSIS — R7302 Impaired glucose tolerance (oral): Secondary | ICD-10-CM

## 2014-07-28 LAB — COMPREHENSIVE METABOLIC PANEL
ALBUMIN: 3.9 g/dL (ref 3.5–5.2)
ALT: 15 U/L (ref 0–35)
AST: 16 U/L (ref 0–37)
Alkaline Phosphatase: 94 U/L (ref 39–117)
BILIRUBIN TOTAL: 0.4 mg/dL (ref 0.2–1.2)
BUN: 16 mg/dL (ref 6–23)
CALCIUM: 9.6 mg/dL (ref 8.4–10.5)
CHLORIDE: 108 meq/L (ref 96–112)
CO2: 27 mEq/L (ref 19–32)
Creat: 0.83 mg/dL (ref 0.50–1.10)
Glucose, Bld: 90 mg/dL (ref 70–99)
POTASSIUM: 3.8 meq/L (ref 3.5–5.3)
Sodium: 144 mEq/L (ref 135–145)
TOTAL PROTEIN: 7 g/dL (ref 6.0–8.3)

## 2014-07-28 LAB — CBC WITH DIFFERENTIAL/PLATELET
Basophils Absolute: 0 10*3/uL (ref 0.0–0.1)
Basophils Relative: 1 % (ref 0–1)
EOS ABS: 0.1 10*3/uL (ref 0.0–0.7)
Eosinophils Relative: 2 % (ref 0–5)
HEMATOCRIT: 36.5 % (ref 36.0–46.0)
Hemoglobin: 11.9 g/dL — ABNORMAL LOW (ref 12.0–15.0)
LYMPHS ABS: 1.6 10*3/uL (ref 0.7–4.0)
Lymphocytes Relative: 35 % (ref 12–46)
MCH: 26.4 pg (ref 26.0–34.0)
MCHC: 32.6 g/dL (ref 30.0–36.0)
MCV: 80.9 fL (ref 78.0–100.0)
MONO ABS: 0.7 10*3/uL (ref 0.1–1.0)
MONOS PCT: 14 % — AB (ref 3–12)
MPV: 9.5 fL (ref 8.6–12.4)
NEUTROS ABS: 2.3 10*3/uL (ref 1.7–7.7)
NEUTROS PCT: 48 % (ref 43–77)
PLATELETS: 328 10*3/uL (ref 150–400)
RBC: 4.51 MIL/uL (ref 3.87–5.11)
RDW: 17 % — AB (ref 11.5–15.5)
WBC: 4.7 10*3/uL (ref 4.0–10.5)

## 2014-07-28 LAB — POCT URINALYSIS DIPSTICK
Nitrite, UA: NEGATIVE
Protein, UA: 30
RBC UA: NEGATIVE
SPEC GRAV UA: 1.02
UROBILINOGEN UA: 0.2
pH, UA: 6

## 2014-07-28 LAB — LIPID PANEL
CHOL/HDL RATIO: 2.7 ratio
CHOLESTEROL: 225 mg/dL — AB (ref 0–200)
HDL: 82 mg/dL (ref >=46)
LDL CALC: 132 mg/dL — AB (ref 0–99)
Triglycerides: 54 mg/dL (ref <150)
VLDL: 11 mg/dL (ref 0–40)

## 2014-07-28 LAB — VITAMIN B12: VITAMIN B 12: 538 pg/mL (ref 211–911)

## 2014-07-28 LAB — TSH: TSH: 1.921 u[IU]/mL (ref 0.350–4.500)

## 2014-07-28 LAB — HEMOGLOBIN A1C
Hgb A1c MFr Bld: 6 % — ABNORMAL HIGH (ref <5.7)
MEAN PLASMA GLUCOSE: 126 mg/dL — AB (ref <117)

## 2014-07-28 MED ORDER — ZOSTER VACCINE LIVE 19400 UNT/0.65ML ~~LOC~~ SOLR: 0.6500 mL | Freq: Once | SUBCUTANEOUS | Status: DC
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
## 2014-07-28 NOTE — Progress Notes (Signed)
   Subjective:    Patient ID: Judy Carlson, female    DOB: 05-Sep-1950, 64 y.o.   MRN: [id number]  HPI    Review of Systems  Constitutional: Positive for activity change.  HENT: Positive for dental problem.   Eyes: Positive for visual disturbance.  Respiratory: Positive for shortness of breath.   Cardiovascular: Positive for leg swelling.  Gastrointestinal: Positive for constipation.  Endocrine: Negative.   Genitourinary: Negative.   Musculoskeletal: Negative.   Skin: Negative.   Allergic/Immunologic: Negative.   Neurological: Negative.   Hematological: Negative.   Psychiatric/Behavioral: Negative.        Objective:   Physical Exam        Assessment & Plan:

## 2014-07-28 NOTE — Patient Instructions (Addendum)
1. Call in two weeks with blood pressure readings. 2. Exercise daily. 3.  Decrease smoking.   Keeping You Healthy  Get These Tests  Blood Pressure- Have your blood pressure checked by your healthcare provider at least once a year.  Normal blood pressure is 120/80.  Weight- Have your body mass index (BMI) calculated to screen for obesity.  BMI is a measure of body fat based on height and weight.  You can calculate your own BMI at https://www.west-esparza.com/www.[email or web address]/bmi/  Cholesterol- Have your cholesterol checked every year.  Diabetes- Have your blood sugar checked every year if you have high blood pressure, high cholesterol, a family history of diabetes or if you are overweight.  Pap Test - Have a pap test every 1 to 5 years if you have been sexually active.  If you are older than 65 and recent pap tests have been normal you may not need additional pap tests.  In addition, if you have had a hysterectomy  for benign disease additional pap tests are not necessary.  Mammogram-Yearly mammograms are essential for early detection of breast cancer  Screening for Colon Cancer- Colonoscopy starting at age 64. Screening may begin sooner depending on your family history and other health conditions.  Follow up colonoscopy as directed by your Gastroenterologist.  Screening for Osteoporosis- Screening begins at age 64 with bone density scanning, sooner if you are at higher risk for developing Osteoporosis.  Get these medicines  Calcium with Vitamin D- Your body requires 1200-1500 mg of Calcium a day and [phone] IU of Vitamin D a day.  You can only absorb 500 mg of Calcium at a time therefore Calcium must be taken in 2 or 3 separate doses throughout the day.  Hormones- Hormone therapy has been associated with increased risk for certain cancers and heart disease.  Talk to your healthcare provider about if you need relief from menopausal symptoms.  Aspirin- Ask your healthcare provider about taking Aspirin to prevent  Heart Disease and Stroke.  Get these Immuniztions  Flu shot- Every fall  Pneumonia shot- Once after the age of 64; if you are younger ask your healthcare provider if you need a pneumonia shot.  Tetanus- Every ten years.  Zostavax- Once after the age of 64 to prevent shingles.  Take these steps  Don't smoke- Your healthcare provider can help you quit. For tips on how to quit, ask your healthcare provider or go to www.smokefree.gov or call 1-800 QUIT-NOW.  Be physically active- Exercise 5 days a week for a minimum of 30 minutes.  If you are not already physically active, start slow and gradually work up to 30 minutes of moderate physical activity.  Try walking, dancing, bike riding, swimming, etc.  Eat a healthy diet- Eat a variety of healthy foods such as fruits, vegetables, whole grains, low fat milk, low fat cheeses, yogurt, lean meats, chicken, fish, eggs, dried beans, tofu, etc.  For more information go to www.thenutritionsource.org  Dental visit- Brush and floss teeth twice daily; visit your dentist twice a year.  Eye exam- Visit your Optometrist or Ophthalmologist yearly.  Drink alcohol in moderation- Limit alcohol intake to one drink or less a day.  Never drink and drive.  Depression- Your emotional health is as important as your physical health.  If you're feeling down or losing interest in things you normally enjoy, please talk to your healthcare provider.  Seat Belts- can save your life; always wear one  Smoke/Carbon Monoxide detectors- These detectors need to be installed  on the appropriate level of your home.  Replace batteries at least once a year.  Violence- If anyone is threatening or hurting you, please tell your healthcare provider.  Living Will/ Health care power of attorney- Discuss with your healthcare provider and family. 

## 2014-07-29 LAB — VITAMIN D 25 HYDROXY (VIT D DEFICIENCY, FRACTURES): Vit D, 25-Hydroxy: 36 ng/mL (ref 30–100)

## 2014-08-22 ENCOUNTER — Other Ambulatory Visit: Payer: Self-pay | Admitting: Family Medicine

## 2014-10-05 ENCOUNTER — Other Ambulatory Visit: Payer: Self-pay | Admitting: Family Medicine

## 2015-02-02 ENCOUNTER — Ambulatory Visit: Payer: No Typology Code available for payment source | Admitting: Family Medicine

## 2015-02-04 ENCOUNTER — Telehealth: Payer: Self-pay

## 2015-02-04 ENCOUNTER — Ambulatory Visit (INDEPENDENT_AMBULATORY_CARE_PROVIDER_SITE_OTHER): Payer: No Typology Code available for payment source | Admitting: Family Medicine

## 2015-02-04 ENCOUNTER — Encounter: Payer: Self-pay | Admitting: Family Medicine

## 2015-02-04 VITALS — BP 130/70 | HR 71 | Temp 98.4°F | Resp 16 | Ht 64.0 in | Wt 163.2 lb

## 2015-02-04 DIAGNOSIS — F329 Major depressive disorder, single episode, unspecified: Secondary | ICD-10-CM

## 2015-02-04 DIAGNOSIS — R7302 Impaired glucose tolerance (oral): Secondary | ICD-10-CM

## 2015-02-04 DIAGNOSIS — F419 Anxiety disorder, unspecified: Secondary | ICD-10-CM

## 2015-02-04 DIAGNOSIS — I10 Essential (primary) hypertension: Secondary | ICD-10-CM | POA: Diagnosis not present

## 2015-02-04 DIAGNOSIS — F418 Other specified anxiety disorders: Secondary | ICD-10-CM | POA: Diagnosis not present

## 2015-02-04 DIAGNOSIS — Z114 Encounter for screening for human immunodeficiency virus [HIV]: Secondary | ICD-10-CM

## 2015-02-04 DIAGNOSIS — Z1159 Encounter for screening for other viral diseases: Secondary | ICD-10-CM

## 2015-02-04 DIAGNOSIS — E7800 Pure hypercholesterolemia, unspecified: Secondary | ICD-10-CM | POA: Diagnosis not present

## 2015-02-04 DIAGNOSIS — H1013 Acute atopic conjunctivitis, bilateral: Secondary | ICD-10-CM

## 2015-02-04 DIAGNOSIS — F32A Depression, unspecified: Secondary | ICD-10-CM

## 2015-02-04 LAB — COMPREHENSIVE METABOLIC PANEL
ALBUMIN: 4 g/dL (ref 3.6–5.1)
ALK PHOS: 93 U/L (ref 33–130)
ALT: 15 U/L (ref 6–29)
AST: 16 U/L (ref 10–35)
BUN: 11 mg/dL (ref 7–25)
CO2: 25 mmol/L (ref 20–31)
CREATININE: 0.83 mg/dL (ref 0.50–0.99)
Calcium: 9.5 mg/dL (ref 8.6–10.4)
Chloride: 106 mmol/L (ref 98–110)
Glucose, Bld: 94 mg/dL (ref 65–99)
POTASSIUM: 3.8 mmol/L (ref 3.5–5.3)
SODIUM: 142 mmol/L (ref 135–146)
TOTAL PROTEIN: 7 g/dL (ref 6.1–8.1)
Total Bilirubin: 0.4 mg/dL (ref 0.2–1.2)

## 2015-02-04 LAB — LIPID PANEL
Cholesterol: 208 mg/dL — ABNORMAL HIGH (ref 125–200)
HDL: 78 mg/dL (ref >=46)
LDL CALC: 115 mg/dL (ref <130)
Total CHOL/HDL Ratio: 2.7 Ratio (ref <=5.0)
Triglycerides: 73 mg/dL (ref <150)
VLDL: 15 mg/dL (ref <30)

## 2015-02-04 LAB — CBC WITH DIFFERENTIAL/PLATELET
BASOS PCT: 0 % (ref 0–1)
Basophils Absolute: 0 10*3/uL (ref 0.0–0.1)
EOS ABS: 0.1 10*3/uL (ref 0.0–0.7)
Eosinophils Relative: 2 % (ref 0–5)
HCT: 38.6 % (ref 36.0–46.0)
Hemoglobin: 12.9 g/dL (ref 12.0–15.0)
LYMPHS ABS: 1.6 10*3/uL (ref 0.7–4.0)
Lymphocytes Relative: 24 % (ref 12–46)
MCH: 26.9 pg (ref 26.0–34.0)
MCHC: 33.4 g/dL (ref 30.0–36.0)
MCV: 80.6 fL (ref 78.0–100.0)
MONO ABS: 0.5 10*3/uL (ref 0.1–1.0)
MONOS PCT: 8 % (ref 3–12)
MPV: 9.8 fL (ref 8.6–12.4)
Neutro Abs: 4.5 10*3/uL (ref 1.7–7.7)
Neutrophils Relative %: 66 % (ref 43–77)
PLATELETS: 320 10*3/uL (ref 150–400)
RBC: 4.79 MIL/uL (ref 3.87–5.11)
RDW: 17.3 % — ABNORMAL HIGH (ref 11.5–15.5)
WBC: 6.8 10*3/uL (ref 4.0–10.5)

## 2015-02-04 LAB — HEPATITIS C ANTIBODY: HCV Ab: NEGATIVE

## 2015-02-04 LAB — HEMOGLOBIN A1C
HEMOGLOBIN A1C: 5.9 % — AB (ref <5.7)
Mean Plasma Glucose: 123 mg/dL — ABNORMAL HIGH (ref <117)

## 2015-02-04 MED ORDER — AMLODIPINE BESYLATE 10 MG PO TABS: 10.0000 mg | ORAL_TABLET | Freq: Every day | ORAL | Status: DC

## 2015-02-04 MED ORDER — METOPROLOL SUCCINATE ER 100 MG PO TB24: ORAL_TABLET | ORAL | Status: DC

## 2015-02-04 MED ORDER — PRAVASTATIN SODIUM 40 MG PO TABS: 40.0000 mg | ORAL_TABLET | Freq: Every day | ORAL | Status: DC

## 2015-02-04 MED ORDER — SERTRALINE HCL 50 MG PO TABS: 50.0000 mg | ORAL_TABLET | Freq: Every day | ORAL | Status: DC

## 2015-02-04 MED ORDER — MELOXICAM 15 MG PO TABS: ORAL_TABLET | ORAL | Status: DC

## 2015-02-04 MED ORDER — OLOPATADINE HCL 0.1 % OP SOLN: 1.0000 [drp] | Freq: Two times a day (BID) | OPHTHALMIC | Status: DC

## 2015-02-04 MED ORDER — FUROSEMIDE 40 MG PO TABS: 40.0000 mg | ORAL_TABLET | Freq: Every day | ORAL | Status: DC

## 2015-02-04 NOTE — Patient Instructions (Signed)
1. Claritin or Zyrtec 10mg  one tablet daily for itching of eyes and drainage in throat. Take for atleast two weeks.

## 2015-02-04 NOTE — Telephone Encounter (Signed)
Patient was seen today and prescribed eye medication. The medication is $200 and would like to know if there is an alternative that she can take
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
## 2015-02-07 LAB — HIV ANTIBODY (ROUTINE TESTING W REFLEX): HIV: NONREACTIVE

## 2015-02-07 NOTE — Telephone Encounter (Signed)
olopatadine (PATANOL) 0.1 % ophthalmic solution 5 mL 3 02/04/2015     Place 1 drop into both eyes 2 (two) times daily. - Both Eyes    Dr. Katrinka BlazingSmith maybe if we switch this to [place]. Please advise.

## 2015-02-08 MED ORDER — AZELASTINE HCL 0.05 % OP SOLN: 1.0000 [drp] | Freq: Two times a day (BID) | OPHTHALMIC | Status: DC

## 2015-02-08 NOTE — Telephone Encounter (Signed)
Recommend an over the counter allergy eye drop.  I will also send in another prescription eye drop but it may be expensive as well.

## 2015-02-09 NOTE — Telephone Encounter (Signed)
Left message on voicemail.

## 2015-03-08 ENCOUNTER — Encounter: Payer: Self-pay | Admitting: Family Medicine

## 2015-04-04 ENCOUNTER — Telehealth: Payer: Self-pay

## 2015-04-04 NOTE — Telephone Encounter (Signed)
Pt states she was put on zoloft ,today was unusually stressful and she wants To know if she can take an additional zoloft?   Best phone for pt is [phone]

## 2015-04-04 NOTE — Telephone Encounter (Signed)
Yes; she can take ONE additional Zoloft.

## 2015-04-05 NOTE — Telephone Encounter (Signed)
Spoke with Pt. And she was advised 

## 2015-05-06 ENCOUNTER — Ambulatory Visit: Payer: No Typology Code available for payment source | Admitting: Family Medicine

## 2015-05-17 ENCOUNTER — Other Ambulatory Visit: Payer: Self-pay | Admitting: Physician Assistant

## 2015-05-17 ENCOUNTER — Ambulatory Visit: Payer: No Typology Code available for payment source | Admitting: Family Medicine

## 2015-06-21 ENCOUNTER — Encounter: Payer: Self-pay | Admitting: Family Medicine

## 2015-06-21 ENCOUNTER — Ambulatory Visit (INDEPENDENT_AMBULATORY_CARE_PROVIDER_SITE_OTHER): Payer: Medicare Other | Admitting: Family Medicine

## 2015-06-21 VITALS — BP 130/80 | HR 89 | Temp 98.2°F | Resp 16 | Ht 65.0 in | Wt 160.0 lb

## 2015-06-21 DIAGNOSIS — F418 Other specified anxiety disorders: Secondary | ICD-10-CM

## 2015-06-21 DIAGNOSIS — R7302 Impaired glucose tolerance (oral): Secondary | ICD-10-CM | POA: Diagnosis not present

## 2015-06-21 DIAGNOSIS — E7800 Pure hypercholesterolemia, unspecified: Secondary | ICD-10-CM

## 2015-06-21 DIAGNOSIS — F32A Depression, unspecified: Secondary | ICD-10-CM

## 2015-06-21 DIAGNOSIS — F419 Anxiety disorder, unspecified: Principal | ICD-10-CM

## 2015-06-21 DIAGNOSIS — I10 Essential (primary) hypertension: Secondary | ICD-10-CM | POA: Diagnosis not present

## 2015-06-21 DIAGNOSIS — F329 Major depressive disorder, single episode, unspecified: Secondary | ICD-10-CM

## 2015-06-21 LAB — COMPREHENSIVE METABOLIC PANEL
ALBUMIN: 4.5 g/dL (ref 3.6–5.1)
ALT: 19 U/L (ref 6–29)
AST: 21 U/L (ref 10–35)
Alkaline Phosphatase: 88 U/L (ref 33–130)
BUN: 15 mg/dL (ref 7–25)
CALCIUM: 9.5 mg/dL (ref 8.6–10.4)
CHLORIDE: 106 mmol/L (ref 98–110)
CO2: 27 mmol/L (ref 20–31)
Creat: 0.79 mg/dL (ref 0.50–0.99)
Glucose, Bld: 97 mg/dL (ref 65–99)
POTASSIUM: 4.2 mmol/L (ref 3.5–5.3)
Sodium: 141 mmol/L (ref 135–146)
TOTAL PROTEIN: 7.2 g/dL (ref 6.1–8.1)
Total Bilirubin: 0.3 mg/dL (ref 0.2–1.2)

## 2015-06-21 LAB — LIPID PANEL
CHOL/HDL RATIO: 2.5 ratio (ref <=5.0)
Cholesterol: 234 mg/dL — ABNORMAL HIGH (ref 125–200)
HDL: 94 mg/dL (ref >=46)
LDL CALC: 126 mg/dL (ref <130)
TRIGLYCERIDES: 68 mg/dL (ref <150)
VLDL: 14 mg/dL (ref <30)

## 2015-06-21 LAB — HEMOGLOBIN A1C
HEMOGLOBIN A1C: 5.9 % — AB (ref <5.7)
Mean Plasma Glucose: 123 mg/dL

## 2015-06-21 MED ORDER — SERTRALINE HCL 100 MG PO TABS: 100.0000 mg | ORAL_TABLET | Freq: Every day | ORAL | Status: DC
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
## 2015-06-21 NOTE — Patient Instructions (Addendum)
1.  Exercise every day for 30  minutes. 2.  Increase Zoloft/Sertraline to 100mg  daily.    IF you received an x-ray today, you will receive an invoice from [facility]. Please contact [facility] at [phone] with questions or concerns regarding your invoice.   IF you received labwork today, you will receive an invoice from [facility]. Please contact [facility] at [phone] with questions or concerns regarding your invoice.   Our billing staff will not be able to assist you with questions regarding bills from these companies.  You will be contacted with the lab results as soon as they are available. The fastest way to get your results is to activate your My Chart account. Instructions are located on the last page of this paperwork. If you have not heard from us regarding the results in 2 weeks, please contact this office.

## 2015-06-26 ENCOUNTER — Encounter: Payer: Self-pay | Admitting: Family Medicine

## 2015-10-13 ENCOUNTER — Other Ambulatory Visit: Payer: Self-pay | Admitting: Family Medicine

## 2015-10-25 ENCOUNTER — Encounter: Payer: Self-pay | Admitting: Family Medicine

## 2015-10-25 ENCOUNTER — Ambulatory Visit (INDEPENDENT_AMBULATORY_CARE_PROVIDER_SITE_OTHER): Payer: Medicare Other | Admitting: Family Medicine

## 2015-10-25 VITALS — BP 122/72 | HR 74 | Temp 98.8°F | Resp 17 | Ht 65.0 in | Wt 170.0 lb

## 2015-10-25 DIAGNOSIS — E7800 Pure hypercholesterolemia, unspecified: Secondary | ICD-10-CM | POA: Diagnosis not present

## 2015-10-25 DIAGNOSIS — I10 Essential (primary) hypertension: Secondary | ICD-10-CM

## 2015-10-25 DIAGNOSIS — R110 Nausea: Secondary | ICD-10-CM | POA: Diagnosis not present

## 2015-10-25 DIAGNOSIS — F419 Anxiety disorder, unspecified: Secondary | ICD-10-CM | POA: Insufficient documentation

## 2015-10-25 DIAGNOSIS — R7302 Impaired glucose tolerance (oral): Secondary | ICD-10-CM

## 2015-10-25 DIAGNOSIS — I878 Other specified disorders of veins: Secondary | ICD-10-CM

## 2015-10-25 DIAGNOSIS — F32A Depression, unspecified: Secondary | ICD-10-CM

## 2015-10-25 DIAGNOSIS — Z23 Encounter for immunization: Secondary | ICD-10-CM | POA: Diagnosis not present

## 2015-10-25 DIAGNOSIS — F329 Major depressive disorder, single episode, unspecified: Secondary | ICD-10-CM

## 2015-10-25 DIAGNOSIS — F418 Other specified anxiety disorders: Secondary | ICD-10-CM

## 2015-10-25 MED ORDER — ZOSTER VACCINE LIVE 19400 UNT/0.65ML ~~LOC~~ SUSR: 0.6500 mL | Freq: Once | SUBCUTANEOUS | 0 refills | Status: AC
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
## 2015-10-25 NOTE — Patient Instructions (Addendum)
1. Drink one glass of milk every morning to help prevent nausea; if no improvement in nausea in two weeks, call Dr. Katrinka BlazingSmith. 2. Avoid Ibuprofen and Aleve while taking Meloxicam. You can take Tylenol.     IF you received an x-ray today, you will receive an invoice from [facility]. Please contact [facility] at [phone] with questions or concerns regarding your invoice.   IF you received labwork today, you will receive an invoice from [facility]. Please contact [facility] at [phone] with questions or concerns regarding your invoice.   Our billing staff will not be able to assist you with questions regarding bills from these companies.  You will be contacted with the lab results as soon as they are available. The fastest way to get your results is to activate your My Chart account. Instructions are located on the last page of this paperwork. If you have not heard from us regarding the results in 2 weeks, please contact this office.

## 2015-10-26 ENCOUNTER — Ambulatory Visit (INDEPENDENT_AMBULATORY_CARE_PROVIDER_SITE_OTHER): Payer: Medicare Other | Admitting: Family Medicine

## 2015-10-26 DIAGNOSIS — E7800 Pure hypercholesterolemia, unspecified: Secondary | ICD-10-CM

## 2015-10-26 DIAGNOSIS — I10 Essential (primary) hypertension: Secondary | ICD-10-CM

## 2015-10-26 DIAGNOSIS — R7302 Impaired glucose tolerance (oral): Secondary | ICD-10-CM

## 2015-10-26 LAB — CBC WITH DIFFERENTIAL/PLATELET
Basophils Absolute: 0 cells/uL (ref 0–200)
Basophils Relative: 0 %
EOS ABS: 140 {cells}/uL (ref 15–500)
Eosinophils Relative: 2 %
HEMATOCRIT: 39.9 % (ref 35.0–45.0)
Hemoglobin: 13.2 g/dL (ref 11.7–15.5)
LYMPHS PCT: 19 %
Lymphs Abs: 1330 cells/uL (ref 850–3900)
MCH: 26.9 pg — AB (ref 27.0–33.0)
MCHC: 33.1 g/dL (ref 32.0–36.0)
MCV: 81.3 fL (ref 80.0–100.0)
MONO ABS: 840 {cells}/uL (ref 200–950)
MONOS PCT: 12 %
MPV: 10.2 fL (ref 7.5–12.5)
NEUTROS PCT: 67 %
Neutro Abs: 4690 cells/uL (ref 1500–7800)
PLATELETS: 303 10*3/uL (ref 140–400)
RBC: 4.91 MIL/uL (ref 3.80–5.10)
RDW: 17.4 % — AB (ref 11.0–15.0)
WBC: 7 10*3/uL (ref 3.8–10.8)

## 2015-10-26 LAB — HEMOGLOBIN A1C
Hgb A1c MFr Bld: 5.4 % (ref <5.7)
Mean Plasma Glucose: 108 mg/dL

## 2015-10-26 LAB — COMPREHENSIVE METABOLIC PANEL
ALBUMIN: 4.1 g/dL (ref 3.6–5.1)
ALT: 21 U/L (ref 6–29)
AST: 24 U/L (ref 10–35)
Alkaline Phosphatase: 77 U/L (ref 33–130)
BUN: 13 mg/dL (ref 7–25)
CHLORIDE: 108 mmol/L (ref 98–110)
CO2: 22 mmol/L (ref 20–31)
CREATININE: 0.84 mg/dL (ref 0.50–0.99)
Calcium: 9.6 mg/dL (ref 8.6–10.4)
Glucose, Bld: 82 mg/dL (ref 65–99)
POTASSIUM: 4.1 mmol/L (ref 3.5–5.3)
SODIUM: 141 mmol/L (ref 135–146)
Total Bilirubin: 0.5 mg/dL (ref 0.2–1.2)
Total Protein: 6.9 g/dL (ref 6.1–8.1)

## 2015-10-26 LAB — LIPID PANEL
CHOL/HDL RATIO: 2.5 ratio (ref <=5.0)
Cholesterol: 216 mg/dL — ABNORMAL HIGH (ref 125–200)
HDL: 87 mg/dL (ref >=46)
LDL CALC: 114 mg/dL (ref <130)
TRIGLYCERIDES: 73 mg/dL (ref <150)
VLDL: 15 mg/dL (ref <30)

## 2015-10-27 ENCOUNTER — Telehealth: Payer: Self-pay

## 2015-10-27 NOTE — Telephone Encounter (Signed)
Pt is feeling any better since her visit from 10/25/15 for  Essential hypertension, benign +6. She states she feels whoozey. She would like a CB concerning this. Please advise at [phone]

## 2015-10-27 NOTE — Telephone Encounter (Signed)
Pt stated she is experiencing dizziness an feels "whoozey". She was here recently but I did not see anything in notes about dizziness. Advised pt to RTC to be evaluated. She stated she will call back Friday after 4pm to make appt for this sat with smith.

## 2015-10-29 ENCOUNTER — Ambulatory Visit: Payer: Commercial Managed Care - HMO

## 2015-10-31 NOTE — Progress Notes (Signed)
Lab encounter only

## 2015-11-30 ENCOUNTER — Other Ambulatory Visit: Payer: Self-pay | Admitting: Family Medicine

## 2016-01-13 ENCOUNTER — Other Ambulatory Visit: Payer: Self-pay | Admitting: Family Medicine

## 2016-02-01 ENCOUNTER — Ambulatory Visit: Payer: Medicare Other | Admitting: Family Medicine

## 2016-02-07 ENCOUNTER — Other Ambulatory Visit: Payer: Self-pay | Admitting: Family Medicine

## 2016-02-10 ENCOUNTER — Encounter: Payer: Self-pay | Admitting: Family Medicine

## 2016-02-28 ENCOUNTER — Encounter: Payer: Self-pay | Admitting: Family Medicine

## 2016-02-28 ENCOUNTER — Ambulatory Visit (INDEPENDENT_AMBULATORY_CARE_PROVIDER_SITE_OTHER): Payer: Commercial Managed Care - HMO | Admitting: Family Medicine

## 2016-02-28 VITALS — BP 127/78 | HR 78 | Temp 98.2°F | Resp 16 | Ht 65.0 in | Wt 171.2 lb

## 2016-02-28 DIAGNOSIS — R7302 Impaired glucose tolerance (oral): Secondary | ICD-10-CM

## 2016-02-28 DIAGNOSIS — I10 Essential (primary) hypertension: Secondary | ICD-10-CM

## 2016-02-28 DIAGNOSIS — E559 Vitamin D deficiency, unspecified: Secondary | ICD-10-CM

## 2016-02-28 DIAGNOSIS — F430 Acute stress reaction: Secondary | ICD-10-CM

## 2016-02-28 DIAGNOSIS — M5030 Other cervical disc degeneration, unspecified cervical region: Secondary | ICD-10-CM

## 2016-02-28 DIAGNOSIS — F32A Depression, unspecified: Secondary | ICD-10-CM

## 2016-02-28 DIAGNOSIS — J069 Acute upper respiratory infection, unspecified: Secondary | ICD-10-CM

## 2016-02-28 DIAGNOSIS — I878 Other specified disorders of veins: Secondary | ICD-10-CM | POA: Diagnosis not present

## 2016-02-28 DIAGNOSIS — F17200 Nicotine dependence, unspecified, uncomplicated: Secondary | ICD-10-CM | POA: Diagnosis not present

## 2016-02-28 DIAGNOSIS — F419 Anxiety disorder, unspecified: Secondary | ICD-10-CM

## 2016-02-28 DIAGNOSIS — F418 Other specified anxiety disorders: Secondary | ICD-10-CM

## 2016-02-28 DIAGNOSIS — E7800 Pure hypercholesterolemia, unspecified: Secondary | ICD-10-CM | POA: Diagnosis not present

## 2016-02-28 DIAGNOSIS — F329 Major depressive disorder, single episode, unspecified: Secondary | ICD-10-CM

## 2016-02-28 MED ORDER — BENZONATATE 100 MG PO CAPS: 100.0000 mg | ORAL_CAPSULE | Freq: Three times a day (TID) | ORAL | 0 refills | Status: DC | PRN

## 2016-02-28 MED ORDER — MUCINEX DM MAXIMUM STRENGTH 60-1200 MG PO TB12: 1.0000 | ORAL_TABLET | Freq: Two times a day (BID) | ORAL | 0 refills | Status: DC

## 2016-02-28 MED ORDER — AZITHROMYCIN 250 MG PO TABS: ORAL_TABLET | ORAL | 0 refills | Status: DC

## 2016-02-28 NOTE — Patient Instructions (Signed)
     IF you received an x-ray today, you will receive an invoice from [facility]. Please contact  Radiology at [phone] with questions or concerns regarding your invoice.   IF you received labwork today, you will receive an invoice from [facility]. Please contact [facility] at [phone] with questions or concerns regarding your invoice.   Our billing staff will not be able to assist you with questions regarding bills from these companies.  You will be contacted with the lab results as soon as they are available. The fastest way to get your results is to activate your My Chart account. Instructions are located on the last page of this paperwork. If you have not heard from us regarding the results in 2 weeks, please contact this office.     
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
## 2016-02-29 LAB — CBC WITH DIFFERENTIAL/PLATELET
BASOS: 1 %
Basophils Absolute: 0 10*3/uL (ref 0.0–0.2)
EOS (ABSOLUTE): 0.1 10*3/uL (ref 0.0–0.4)
EOS: 2 %
HEMATOCRIT: 42.2 % (ref 34.0–46.6)
HEMOGLOBIN: 14.1 g/dL (ref 11.1–15.9)
IMMATURE GRANULOCYTES: 0 %
Immature Grans (Abs): 0 10*3/uL (ref 0.0–0.1)
LYMPHS ABS: 1.4 10*3/uL (ref 0.7–3.1)
Lymphs: 27 %
MCH: 26.9 pg (ref 26.6–33.0)
MCHC: 33.4 g/dL (ref 31.5–35.7)
MCV: 81 fL (ref 79–97)
MONOS ABS: 1 10*3/uL — AB (ref 0.1–0.9)
Monocytes: 20 %
NEUTROS ABS: 2.6 10*3/uL (ref 1.4–7.0)
Neutrophils: 50 %
Platelets: 273 10*3/uL (ref 150–379)
RBC: 5.24 x10E6/uL (ref 3.77–5.28)
RDW: 17.6 % — ABNORMAL HIGH (ref 12.3–15.4)
WBC: 5.1 10*3/uL (ref 3.4–10.8)

## 2016-02-29 LAB — LIPID PANEL
CHOL/HDL RATIO: 3.7 ratio (ref 0.0–4.4)
Cholesterol, Total: 224 mg/dL — ABNORMAL HIGH (ref 100–199)
HDL: 60 mg/dL (ref >39)
LDL CALC: 142 mg/dL — AB (ref 0–99)
TRIGLYCERIDES: 108 mg/dL (ref 0–149)
VLDL Cholesterol Cal: 22 mg/dL (ref 5–40)

## 2016-02-29 LAB — COMPREHENSIVE METABOLIC PANEL
A/G RATIO: 1.6 (ref 1.2–2.2)
ALBUMIN: 4.3 g/dL (ref 3.6–4.8)
ALT: 22 IU/L (ref 0–32)
AST: 22 IU/L (ref 0–40)
Alkaline Phosphatase: 86 IU/L (ref 39–117)
BUN / CREAT RATIO: 20 (ref 12–28)
BUN: 26 mg/dL (ref 8–27)
Bilirubin Total: 0.2 mg/dL (ref 0.0–1.2)
CALCIUM: 9.7 mg/dL (ref 8.7–10.3)
CO2: 24 mmol/L (ref 18–29)
CREATININE: 1.33 mg/dL — AB (ref 0.57–1.00)
Chloride: 103 mmol/L (ref 96–106)
GFR calc Af Amer: 48 mL/min/{1.73_m2} — ABNORMAL LOW (ref >59)
GFR, EST NON AFRICAN AMERICAN: 42 mL/min/{1.73_m2} — AB (ref >59)
GLOBULIN, TOTAL: 2.7 g/dL (ref 1.5–4.5)
Glucose: 100 mg/dL — ABNORMAL HIGH (ref 65–99)
POTASSIUM: 4.9 mmol/L (ref 3.5–5.2)
SODIUM: 143 mmol/L (ref 134–144)
Total Protein: 7 g/dL (ref 6.0–8.5)

## 2016-02-29 LAB — INFLUENZA A AND B
INFLUENZA A AG, EIA: NEGATIVE
INFLUENZA B AG, EIA: NEGATIVE

## 2016-02-29 LAB — HEMOGLOBIN A1C
Est. average glucose Bld gHb Est-mCnc: 117 mg/dL
HEMOGLOBIN A1C: 5.7 % — AB (ref 4.8–5.6)

## 2016-02-29 LAB — PLEASE NOTE:

## 2016-03-02 ENCOUNTER — Telehealth: Payer: Self-pay

## 2016-03-02 ENCOUNTER — Inpatient Hospital Stay (HOSPITAL_COMMUNITY)
Admission: EM | Admit: 2016-03-02 | Discharge: 2016-03-04 | DRG: 378 | Disposition: A | Discharge status: Home / Self Care | Payer: Commercial Managed Care - HMO | Attending: Internal Medicine | Admitting: Internal Medicine

## 2016-03-02 ENCOUNTER — Encounter (HOSPITAL_COMMUNITY): Payer: Self-pay | Admitting: Emergency Medicine

## 2016-03-02 DIAGNOSIS — F17200 Nicotine dependence, unspecified, uncomplicated: Secondary | ICD-10-CM | POA: Diagnosis not present

## 2016-03-02 DIAGNOSIS — I7300 Raynaud's syndrome without gangrene: Secondary | ICD-10-CM | POA: Diagnosis present

## 2016-03-02 DIAGNOSIS — Z86010 Personal history of colonic polyps: Secondary | ICD-10-CM

## 2016-03-02 DIAGNOSIS — Z791 Long term (current) use of non-steroidal anti-inflammatories (NSAID): Secondary | ICD-10-CM

## 2016-03-02 DIAGNOSIS — D500 Iron deficiency anemia secondary to blood loss (chronic): Secondary | ICD-10-CM | POA: Diagnosis not present

## 2016-03-02 DIAGNOSIS — Z23 Encounter for immunization: Secondary | ICD-10-CM

## 2016-03-02 DIAGNOSIS — Z79899 Other long term (current) drug therapy: Secondary | ICD-10-CM

## 2016-03-02 DIAGNOSIS — Z8249 Family history of ischemic heart disease and other diseases of the circulatory system: Secondary | ICD-10-CM

## 2016-03-02 DIAGNOSIS — I10 Essential (primary) hypertension: Secondary | ICD-10-CM | POA: Diagnosis present

## 2016-03-02 DIAGNOSIS — K5731 Diverticulosis of large intestine without perforation or abscess with bleeding: Principal | ICD-10-CM

## 2016-03-02 DIAGNOSIS — D62 Acute posthemorrhagic anemia: Secondary | ICD-10-CM | POA: Diagnosis present

## 2016-03-02 DIAGNOSIS — J209 Acute bronchitis, unspecified: Secondary | ICD-10-CM | POA: Diagnosis present

## 2016-03-02 DIAGNOSIS — Z7982 Long term (current) use of aspirin: Secondary | ICD-10-CM

## 2016-03-02 DIAGNOSIS — K922 Gastrointestinal hemorrhage, unspecified: Secondary | ICD-10-CM | POA: Diagnosis not present

## 2016-03-02 DIAGNOSIS — Z66 Do not resuscitate: Secondary | ICD-10-CM | POA: Diagnosis present

## 2016-03-02 DIAGNOSIS — K921 Melena: Secondary | ICD-10-CM

## 2016-03-02 DIAGNOSIS — Z880 Allergy status to penicillin: Secondary | ICD-10-CM

## 2016-03-02 DIAGNOSIS — Z8673 Personal history of transient ischemic attack (TIA), and cerebral infarction without residual deficits: Secondary | ICD-10-CM

## 2016-03-02 DIAGNOSIS — E785 Hyperlipidemia, unspecified: Secondary | ICD-10-CM | POA: Diagnosis present

## 2016-03-02 DIAGNOSIS — Z888 Allergy status to other drugs, medicaments and biological substances status: Secondary | ICD-10-CM

## 2016-03-02 DIAGNOSIS — F17210 Nicotine dependence, cigarettes, uncomplicated: Secondary | ICD-10-CM | POA: Diagnosis present

## 2016-03-02 DIAGNOSIS — Z807 Family history of other malignant neoplasms of lymphoid, hematopoietic and related tissues: Secondary | ICD-10-CM

## 2016-03-02 LAB — COMPREHENSIVE METABOLIC PANEL
ALK PHOS: 60 U/L (ref 38–126)
ALT: 19 U/L (ref 14–54)
AST: 19 U/L (ref 15–41)
Albumin: 3.6 g/dL (ref 3.5–5.0)
Anion gap: 7 (ref 5–15)
BILIRUBIN TOTAL: 0.5 mg/dL (ref 0.3–1.2)
BUN: 26 mg/dL — AB (ref 6–20)
CALCIUM: 8.6 mg/dL — AB (ref 8.9–10.3)
CO2: 25 mmol/L (ref 22–32)
CREATININE: 0.88 mg/dL (ref 0.44–1.00)
Chloride: 107 mmol/L (ref 101–111)
Glucose, Bld: 113 mg/dL — ABNORMAL HIGH (ref 65–99)
Potassium: 3.5 mmol/L (ref 3.5–5.1)
Sodium: 139 mmol/L (ref 135–145)
Total Protein: 6.5 g/dL (ref 6.5–8.1)

## 2016-03-02 LAB — CBC
HCT: 28.6 % — ABNORMAL LOW (ref 36.0–46.0)
Hemoglobin: 9.6 g/dL — ABNORMAL LOW (ref 12.0–15.0)
MCH: 26.9 pg (ref 26.0–34.0)
MCHC: 33.6 g/dL (ref 30.0–36.0)
MCV: 80.1 fL (ref 78.0–100.0)
PLATELETS: 257 10*3/uL (ref 150–400)
RBC: 3.57 MIL/uL — AB (ref 3.87–5.11)
RDW: 15.8 % — AB (ref 11.5–15.5)
WBC: 5.2 10*3/uL (ref 4.0–10.5)

## 2016-03-02 LAB — ABO/RH: ABO/RH(D): A POS

## 2016-03-02 NOTE — ED Notes (Signed)
Pt requesting something to eat.  Dr. Adela Glimpseoutova in to room.  Question deferred to Dr. Adela Glimpseoutova.

## 2016-03-02 NOTE — ED Notes (Signed)
Pt has decided to stay, Dr. Ethelda ChickJacubowitz informed.

## 2016-03-02 NOTE — Discharge Instructions (Signed)
Your leaving AGAINST MEDICAL ADVICE. Please go to another hospital or return for further treatment. There is a risk that you could bleed to death

## 2016-03-02 NOTE — ED Notes (Signed)
Pt requesting to leave.

## 2016-03-02 NOTE — ED Notes (Signed)
Dr. Adela Glimpseoutova in to room.

## 2016-03-02 NOTE — ED Notes (Signed)
Dr. Adela Glimpseoutova out of room stating pt is refusing to talk to her.
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
## 2016-03-02 NOTE — ED Notes (Signed)
POC collected by Dr. Ethelda ChickJacubowitz.

## 2016-03-02 NOTE — Telephone Encounter (Signed)
Not any better.  Cough Medication sometimes helps, other times does not work.  Dr. Katrinka BlazingSmith.   [phone]

## 2016-03-02 NOTE — Telephone Encounter (Signed)
Pt called back to see if her message had been looked at. The first message was supposed to relay that she is now having diarrhea and rectal bleeding. I apologized that this was left out of the first message & that I am not sure why it was not communicated.   I offered to get the patient scheduled today or that she should go to the nearest ER as these symptoms cannot be treated over the phone. It would be best for us to see her and ensure that she is okay while trying to resolve the problem. She responded with "no thank you, goodbye" and hung up the phone.

## 2016-03-02 NOTE — ED Triage Notes (Signed)
Pt complaint of blood in stool onset last night. Pt verbalizes 12 stools in past 24 hours bright red in color. Pt denies use of blood thinners.
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
## 2016-03-03 DIAGNOSIS — K921 Melena: Secondary | ICD-10-CM | POA: Diagnosis not present

## 2016-03-03 DIAGNOSIS — Z807 Family history of other malignant neoplasms of lymphoid, hematopoietic and related tissues: Secondary | ICD-10-CM | POA: Diagnosis not present

## 2016-03-03 DIAGNOSIS — K922 Gastrointestinal hemorrhage, unspecified: Secondary | ICD-10-CM | POA: Diagnosis present

## 2016-03-03 DIAGNOSIS — Z791 Long term (current) use of non-steroidal anti-inflammatories (NSAID): Secondary | ICD-10-CM | POA: Diagnosis not present

## 2016-03-03 DIAGNOSIS — D62 Acute posthemorrhagic anemia: Secondary | ICD-10-CM | POA: Diagnosis present

## 2016-03-03 DIAGNOSIS — Z8249 Family history of ischemic heart disease and other diseases of the circulatory system: Secondary | ICD-10-CM | POA: Diagnosis not present

## 2016-03-03 DIAGNOSIS — I7300 Raynaud's syndrome without gangrene: Secondary | ICD-10-CM | POA: Diagnosis present

## 2016-03-03 DIAGNOSIS — J209 Acute bronchitis, unspecified: Secondary | ICD-10-CM | POA: Diagnosis present

## 2016-03-03 DIAGNOSIS — K5731 Diverticulosis of large intestine without perforation or abscess with bleeding: Secondary | ICD-10-CM | POA: Diagnosis present

## 2016-03-03 DIAGNOSIS — Z86010 Personal history of colonic polyps: Secondary | ICD-10-CM | POA: Diagnosis not present

## 2016-03-03 DIAGNOSIS — Z79899 Other long term (current) drug therapy: Secondary | ICD-10-CM | POA: Diagnosis not present

## 2016-03-03 DIAGNOSIS — K5791 Diverticulosis of intestine, part unspecified, without perforation or abscess with bleeding: Secondary | ICD-10-CM | POA: Diagnosis not present

## 2016-03-03 DIAGNOSIS — Z7982 Long term (current) use of aspirin: Secondary | ICD-10-CM | POA: Diagnosis not present

## 2016-03-03 DIAGNOSIS — I10 Essential (primary) hypertension: Secondary | ICD-10-CM | POA: Diagnosis present

## 2016-03-03 DIAGNOSIS — Z888 Allergy status to other drugs, medicaments and biological substances status: Secondary | ICD-10-CM | POA: Diagnosis not present

## 2016-03-03 DIAGNOSIS — Z8673 Personal history of transient ischemic attack (TIA), and cerebral infarction without residual deficits: Secondary | ICD-10-CM | POA: Diagnosis not present

## 2016-03-03 DIAGNOSIS — Z66 Do not resuscitate: Secondary | ICD-10-CM | POA: Diagnosis present

## 2016-03-03 DIAGNOSIS — F17200 Nicotine dependence, unspecified, uncomplicated: Secondary | ICD-10-CM | POA: Diagnosis not present

## 2016-03-03 DIAGNOSIS — F17210 Nicotine dependence, cigarettes, uncomplicated: Secondary | ICD-10-CM | POA: Diagnosis present

## 2016-03-03 DIAGNOSIS — Z23 Encounter for immunization: Secondary | ICD-10-CM | POA: Diagnosis present

## 2016-03-03 DIAGNOSIS — D500 Iron deficiency anemia secondary to blood loss (chronic): Secondary | ICD-10-CM

## 2016-03-03 DIAGNOSIS — E785 Hyperlipidemia, unspecified: Secondary | ICD-10-CM | POA: Diagnosis present

## 2016-03-03 DIAGNOSIS — Z880 Allergy status to penicillin: Secondary | ICD-10-CM | POA: Diagnosis not present

## 2016-03-03 LAB — CBC
HCT: 21.5 % — ABNORMAL LOW (ref 36.0–46.0)
HCT: 20.1 % — ABNORMAL LOW (ref 36.0–46.0)
HCT: 29.3 % — ABNORMAL LOW (ref 36.0–46.0)
HEMATOCRIT: 23.9 % — AB (ref 36.0–46.0)
HEMOGLOBIN: 8.1 g/dL — AB (ref 12.0–15.0)
HEMOGLOBIN: 10.1 g/dL — AB (ref 12.0–15.0)
Hemoglobin: 7.4 g/dL — ABNORMAL LOW (ref 12.0–15.0)
Hemoglobin: 6.8 g/dL — CL (ref 12.0–15.0)
MCH: 27.1 pg (ref 26.0–34.0)
MCH: 27.6 pg (ref 26.0–34.0)
MCH: 26.9 pg (ref 26.0–34.0)
MCH: 28.5 pg (ref 26.0–34.0)
MCHC: 33.9 g/dL (ref 30.0–36.0)
MCHC: 34.4 g/dL (ref 30.0–36.0)
MCHC: 33.8 g/dL (ref 30.0–36.0)
MCHC: 34.5 g/dL (ref 30.0–36.0)
MCV: 79.9 fL (ref 78.0–100.0)
MCV: 80.2 fL (ref 78.0–100.0)
MCV: 79.4 fL (ref 78.0–100.0)
MCV: 82.5 fL (ref 78.0–100.0)
PLATELETS: 210 10*3/uL (ref 150–400)
PLATELETS: 213 10*3/uL (ref 150–400)
PLATELETS: 228 10*3/uL (ref 150–400)
Platelets: 234 10*3/uL (ref 150–400)
RBC: 2.99 MIL/uL — ABNORMAL LOW (ref 3.87–5.11)
RBC: 2.68 MIL/uL — ABNORMAL LOW (ref 3.87–5.11)
RBC: 2.53 MIL/uL — ABNORMAL LOW (ref 3.87–5.11)
RBC: 3.55 MIL/uL — AB (ref 3.87–5.11)
RDW: 15.6 % — AB (ref 11.5–15.5)
RDW: 15.7 % — ABNORMAL HIGH (ref 11.5–15.5)
RDW: 15.7 % — AB (ref 11.5–15.5)
RDW: 15.2 % (ref 11.5–15.5)
WBC: 6.2 10*3/uL (ref 4.0–10.5)
WBC: 5.3 10*3/uL (ref 4.0–10.5)
WBC: 5.4 10*3/uL (ref 4.0–10.5)
WBC: 8.4 10*3/uL (ref 4.0–10.5)

## 2016-03-03 LAB — COMPREHENSIVE METABOLIC PANEL
ALBUMIN: 3.5 g/dL (ref 3.5–5.0)
ALT: 15 U/L (ref 14–54)
AST: 17 U/L (ref 15–41)
Alkaline Phosphatase: 53 U/L (ref 38–126)
Anion gap: 7 (ref 5–15)
BILIRUBIN TOTAL: 0.7 mg/dL (ref 0.3–1.2)
BUN: 26 mg/dL — AB (ref 6–20)
CHLORIDE: 110 mmol/L (ref 101–111)
CO2: 21 mmol/L — AB (ref 22–32)
Calcium: 8.3 mg/dL — ABNORMAL LOW (ref 8.9–10.3)
Creatinine, Ser: 0.88 mg/dL (ref 0.44–1.00)
GFR calc Af Amer: >60 mL/min (ref >60)
GFR calc non Af Amer: >60 mL/min (ref >60)
GLUCOSE: 107 mg/dL — AB (ref 65–99)
POTASSIUM: 3.2 mmol/L — AB (ref 3.5–5.1)
SODIUM: 138 mmol/L (ref 135–145)
Total Protein: 5.6 g/dL — ABNORMAL LOW (ref 6.5–8.1)

## 2016-03-03 LAB — TSH: TSH: 1.302 u[IU]/mL (ref 0.350–4.500)

## 2016-03-03 LAB — PHOSPHORUS: PHOSPHORUS: 3.1 mg/dL (ref 2.5–4.6)

## 2016-03-03 LAB — PREPARE RBC (CROSSMATCH)

## 2016-03-03 LAB — MAGNESIUM: Magnesium: 2.2 mg/dL (ref 1.7–2.4)

## 2016-03-03 MED ORDER — PANTOPRAZOLE SODIUM 40 MG IV SOLR
40.0000 mg | Freq: Two times a day (BID) | INTRAVENOUS | Status: DC
  Administered 2016-03-03 – 2016-03-04 (×4): 40 mg via INTRAVENOUS
  Filled 2016-03-03 (×5): qty 40

## 2016-03-03 MED ORDER — ONDANSETRON HCL 4 MG PO TABS: 4.0000 mg | ORAL_TABLET | Freq: Four times a day (QID) | ORAL | Status: DC | PRN

## 2016-03-03 MED ORDER — PEG-KCL-NACL-NASULF-NA ASC-C 100 G PO SOLR: 1.0000 | Freq: Once | ORAL | Status: DC

## 2016-03-03 MED ORDER — GUAIFENESIN-DM 100-10 MG/5ML PO SYRP
5.0000 mL | ORAL_SOLUTION | ORAL | Status: DC | PRN
  Administered 2016-03-03 – 2016-03-04 (×4): 5 mL via ORAL
  Filled 2016-03-03 (×4): qty 10

## 2016-03-03 MED ORDER — PEG-KCL-NACL-NASULF-NA ASC-C 100 G PO SOLR
0.5000 | Freq: Once | ORAL | Status: AC
  Administered 2016-03-03: 100 g via ORAL
  Filled 2016-03-03: qty 1

## 2016-03-03 MED ORDER — NICOTINE 14 MG/24HR TD PT24
14.0000 mg | MEDICATED_PATCH | Freq: Every day | TRANSDERMAL | Status: DC
  Filled 2016-03-03: qty 1

## 2016-03-03 MED ORDER — SODIUM CHLORIDE 0.9 % IV SOLN
INTRAVENOUS | Status: AC
  Administered 2016-03-03: 02:00:00 via INTRAVENOUS

## 2016-03-03 MED ORDER — SODIUM CHLORIDE 0.9 % IV SOLN
Freq: Once | INTRAVENOUS | Status: AC
  Administered 2016-03-03: 15:00:00 via INTRAVENOUS

## 2016-03-03 MED ORDER — ONDANSETRON HCL 4 MG/2ML IJ SOLN: 4.0000 mg | Freq: Four times a day (QID) | INTRAMUSCULAR | Status: DC | PRN

## 2016-03-03 MED ORDER — FUROSEMIDE 10 MG/ML IJ SOLN
20.0000 mg | Freq: Once | INTRAMUSCULAR | Status: AC
  Administered 2016-03-03: 20 mg via INTRAVENOUS
  Filled 2016-03-03: qty 2

## 2016-03-03 MED ORDER — PRAVASTATIN SODIUM 40 MG PO TABS
40.0000 mg | ORAL_TABLET | Freq: Every day | ORAL | Status: DC
  Administered 2016-03-03 – 2016-03-04 (×2): 40 mg via ORAL
  Filled 2016-03-03 (×2): qty 1

## 2016-03-03 MED ORDER — PEG-KCL-NACL-NASULF-NA ASC-C 100 G PO SOLR
0.5000 | Freq: Once | ORAL | Status: AC
  Administered 2016-03-04: 100 g via ORAL
  Filled 2016-03-03: qty 1

## 2016-03-03 MED ORDER — ACETAMINOPHEN 650 MG RE SUPP: 650.0000 mg | Freq: Four times a day (QID) | RECTAL | Status: DC | PRN

## 2016-03-03 MED ORDER — SODIUM CHLORIDE 0.9% FLUSH
3.0000 mL | Freq: Two times a day (BID) | INTRAVENOUS | Status: DC
Start: 2016-03-03 — End: 2016-03-04
  Administered 2016-03-04: 3 mL via INTRAVENOUS

## 2016-03-03 MED ORDER — ACETAMINOPHEN 325 MG PO TABS: 650.0000 mg | ORAL_TABLET | Freq: Four times a day (QID) | ORAL | Status: DC | PRN

## 2016-03-03 MED ORDER — HYDROCODONE-ACETAMINOPHEN 5-325 MG PO TABS: 1.0000 | ORAL_TABLET | ORAL | Status: DC | PRN

## 2016-03-03 MED ORDER — SODIUM CHLORIDE 0.9 % IV SOLN
INTRAVENOUS | Status: DC
  Administered 2016-03-03: 13:00:00 via INTRAVENOUS

## 2016-03-03 MED ORDER — INFLUENZA VAC SPLIT QUAD 0.5 ML IM SUSY
0.5000 mL | PREFILLED_SYRINGE | INTRAMUSCULAR | Status: AC
  Administered 2016-03-04: 0.5 mL via INTRAMUSCULAR
  Filled 2016-03-03: qty 0.5

## 2016-03-03 NOTE — Progress Notes (Signed)
PROGRESS NOTE  Judy Carlson XTG:[id number] DOB: 06/01/1950 DOA: 03/02/2016 PCP: Reginia Forts, MD  HPI/Recap of past 43 hours: 65 year old female with past medical history of diverticulosis, hypertension and TIAs on aspirin he started noting increased number of stools as well as bright red blood in her stools starting on the night of 12/28 going into the next day. She became concerned and came into the emergency room. Patient's blood work have been checked several days prior in the office and was noted to be 14.1. The emergency room on 12/29, hemoglobin of 9.6. Over the next 12 hours, hemoglobin had dropped to 7.4. Seen by GI and plans are for colonoscopy on 12/31 morning. Patient herself feels like the bleeding has since stopped. She is self has no complaints other than in occasional productive cough with yellowish sputum.Marland Kitchen However, hemoglobin checked at 1 PM on 12/30 dropped to 6.8 Inpatient ordered 2 units packed red blood cells.  Assessment/Plan: Active Problems:   TOBACCO ABUSE: Patient declined nicotine patch.   Essential hypertension, benign: Stable   GI bleed, suspected secondary to diverticular causing acute blood loss anemia: For colonoscopy on 12/31. In the meantime, transfusing 2 units packed red blood cells  Cough: We'll continue to monitor. No signs of acute infection. Lungs clear on exam   Code Status: Full code  Family Communication: patient declined for me to call anyone   Disposition Plan: Depending on outcome of colonoscopy, here for at least the next one to 2 days    Consultants:  GI   Procedures:  2 units packed red blood cells transfused 12/30  Plan colonoscopy 12/31   Antimicrobials:  None   DVT prophylaxis:  SCDs   Objective: Vitals:   03/02/16 2325 03/03/16 0105 03/03/16 0122 03/03/16 0611  BP: 126/92 (!) 106/53 135/66 (!) 110/59  Pulse: 112 82 83 78  Resp: 20 13 16 18   Temp: 97.8 F (36.6 C)  98 F (36.7 C) 98.2 F (36.8 C)  TempSrc:  Oral  Oral Oral  SpO2: 100% 98% 100% 100%  Weight:   76.8 kg (169 lb 4.8 oz)   Height:   5' 5"  (1.651 m)     Intake/Output Summary (Last 24 hours) at 03/03/16 1347 Last data filed at 03/03/16 0600  Gross per 24 hour  Intake              440 ml  Output                0 ml  Net              440 ml   Filed Weights   03/02/16 1645 03/03/16 0122  Weight: 74.5 kg (164 lb 3.2 oz) 76.8 kg (169 lb 4.8 oz)    Exam:   General:  Alert and oriented 3, no acute distress   Cardiovascular: regular rate and rhythm, S1-S2   Respiratory: clear to auscultation bilaterally   Abdomen: soft, nontender, nondistended, positive bowel sounds   Musculoskeletal: no clubbing or cyanosis or edema   Skin: no skin breaks, tears or lesions  Psychiatry: patient is appropriate, no evidence of psychoses    Data Reviewed: CBC:  Recent Labs Lab 02/28/16 0932 03/02/16 1715 03/03/16 0201 03/03/16 0710 03/03/16 1253  WBC 5.1 5.2 6.2 5.3 5.4  NEUTROABS 2.6  --   --   --   --   HGB  --  9.6* 8.1* 7.4* 6.8*  HCT 42.2 28.6* 23.9* 21.5* 20.1*  MCV 81 80.1 79.9 80.2 79.4  PLT 273 257 234 210 989   Basic Metabolic Panel:  Recent Labs Lab 02/28/16 0932 03/02/16 1715 03/03/16 0201  NA 143 139 138  K 4.9 3.5 3.2*  CL 103 107 110  CO2 24 25 21*  GLUCOSE 100* 113* 107*  BUN 26 26* 26*  CREATININE 1.33* 0.88 0.88  CALCIUM 9.7 8.6* 8.3*  MG  --   --  2.2  PHOS  --   --  3.1   GFR: Estimated Creatinine Clearance: 65.3 mL/min (by C-G formula based on SCr of 0.88 mg/dL). Liver Function Tests:  Recent Labs Lab 02/28/16 0932 03/02/16 1715 03/03/16 0201  AST 22 19 17   ALT 22 19 15   ALKPHOS 86 60 53  BILITOT 0.2 0.5 0.7  PROT 7.0 6.5 5.6*  ALBUMIN 4.3 3.6 3.5   No results for input(s): LIPASE, AMYLASE in the last 168 hours. No results for input(s): AMMONIA in the last 168 hours. Coagulation Profile: No results for input(s): INR, PROTIME in the last 168 hours. Cardiac Enzymes: No results  for input(s): CKTOTAL, CKMB, CKMBINDEX, TROPONINI in the last 168 hours. BNP (last 3 results) No results for input(s): PROBNP in the last 8760 hours. HbA1C: No results for input(s): HGBA1C in the last 72 hours. CBG: No results for input(s): GLUCAP in the last 168 hours. Lipid Profile: No results for input(s): CHOL, HDL, LDLCALC, TRIG, CHOLHDL, LDLDIRECT in the last 72 hours. Thyroid Function Tests:  Recent Labs  03/03/16 0201  TSH 1.302   Anemia Panel: No results for input(s): VITAMINB12, FOLATE, FERRITIN, TIBC, IRON, RETICCTPCT in the last 72 hours. Urine analysis:    Component Value Date/Time   COLORURINE YELLOW 04/30/2007 0724   APPEARANCEUR Clear 04/30/2007 0724   LABSPEC 1.015 04/30/2007 0724   PHURINE 6.5 04/30/2007 0724   GLUCOSEU NEGATIVE 04/30/2007 0724   BILIRUBINUR clear 07/28/2014 0914   KETONESUR NEGATIVE 04/30/2007 0724   PROTEINUR 30 07/28/2014 0914   UROBILINOGEN 0.2 07/28/2014 0914   UROBILINOGEN 0.2 mg/dL 04/30/2007 0724   NITRITE neg 07/28/2014 0914   NITRITE Negative 04/30/2007 0724   LEUKOCYTESUR Trace 07/28/2014 0914   Sepsis Labs: @LABRCNTIP (procalcitonin:4,lacticidven:4)  ) Recent Results (from the past 240 hour(s))  Influenza a and b     Status: None   Collection Time: 02/28/16  9:38 AM  Result Value Ref Range Status   Influenza A Ag, EIA Negative Negative Final   Influenza B Ag, EIA Negative Negative Final   Influenza Comment See note  Final      Studies: No results found.  Scheduled Meds: . sodium chloride   Intravenous Once  . furosemide  20 mg Intravenous Once  . furosemide  20 mg Intravenous Once  . [START ON 03/04/2016] Influenza vac split quadrivalent PF  0.5 mL Intramuscular Tomorrow-1000  . nicotine  14 mg Transdermal Daily  . pantoprazole (PROTONIX) IV  40 mg Intravenous Q12H  . peg 3350 powder  0.5 kit Oral Once   And  . [START ON 03/04/2016] peg 3350 powder  0.5 kit Oral Once  . pravastatin  40 mg Oral Daily  . sodium  chloride flush  3 mL Intravenous Q12H    Continuous Infusions: . sodium chloride 50 mL/hr at 03/03/16 1240     LOS: 0 days     Annita Brod, MD [facility] Pager [phone]  If 7PM-7AM, please contact night-coverage [email or web address] Password [email or web address] 03/03/2016, 1:47 PM
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
## 2016-03-04 ENCOUNTER — Encounter (HOSPITAL_COMMUNITY): Payer: Self-pay

## 2016-03-04 ENCOUNTER — Encounter (HOSPITAL_COMMUNITY)
Admission: EM | Disposition: A | Discharge status: Home / Self Care | Payer: Self-pay | Source: Home / Self Care | Attending: Internal Medicine

## 2016-03-04 DIAGNOSIS — K5731 Diverticulosis of large intestine without perforation or abscess with bleeding: Secondary | ICD-10-CM

## 2016-03-04 HISTORY — PX: COLONOSCOPY: SHX5424

## 2016-03-04 SURGERY — COLONOSCOPY
Anesthesia: Moderate Sedation

## 2016-03-04 MED ORDER — MIDAZOLAM HCL 5 MG/5ML IJ SOLN
INTRAMUSCULAR | Status: DC | PRN
  Administered 2016-03-04 (×2): 2 mg via INTRAVENOUS
  Administered 2016-03-04 (×2): 1 mg via INTRAVENOUS

## 2016-03-04 MED ORDER — FENTANYL CITRATE (PF) 100 MCG/2ML IJ SOLN
INTRAMUSCULAR | Status: DC | PRN
Start: 2016-03-04 — End: 2016-03-04
  Administered 2016-03-04 (×4): 25 ug via INTRAVENOUS

## 2016-03-04 MED ORDER — FENTANYL CITRATE (PF) 100 MCG/2ML IJ SOLN
INTRAMUSCULAR | Status: AC
  Filled 2016-03-04: qty 2

## 2016-03-04 MED ORDER — MIDAZOLAM HCL 5 MG/ML IJ SOLN
INTRAMUSCULAR | Status: AC
  Filled 2016-03-04: qty 2

## 2016-03-04 MED ORDER — DIPHENHYDRAMINE HCL 50 MG/ML IJ SOLN
INTRAMUSCULAR | Status: AC
  Filled 2016-03-04: qty 1

## 2016-03-04 NOTE — Progress Notes (Signed)
Went over all discharge paperwork with patient.  All questions answered.  Discharge summary given to patient.  Pt stating she would like to drive herself home.  Walked with patient in the hall, tolerated well.  States she does not feel dizzy or lightheaded.  Per MD ok for pt to drive home.

## 2016-03-04 NOTE — Op Note (Signed)
[facility] Patient Name: Judy Carlson Procedure Date: 03/04/2016 MRN: [id number] Attending MD: Starr Lake. Myrtie Neither , MD Date of Birth: [birth year] CSN: [id number] Age: 65 Admit Type: Inpatient Procedure:                Colonoscopy Indications:              Hematochezia, Acute post hemorrhagic anemia Providers:                Sherilyn Cooter L. Myrtie Neither, MD, Anthony Sar, RN, Arlee Muslim Tech., Technician Referring MD:              Medicines:                Midazolam 6 mg IV, Fentanyl 100 micrograms IV Complications:            No immediate complications. Estimated Blood Loss:     Estimated blood loss: none. Procedure:                Pre-Anesthesia Assessment:                           - Prior to the procedure, a History and Physical                            was performed, and patient medications and                            allergies were reviewed. The patient's tolerance of                            previous anesthesia was also reviewed. The risks                            and benefits of the procedure and the sedation                            options and risks were discussed with the patient.                            All questions were answered, and informed consent                            was obtained. Prior Anticoagulants: The patient has                            taken no previous anticoagulant or antiplatelet                            agents. ASA Grade Assessment: II - A patient with                            mild systemic disease. After reviewing the risks  and benefits, the patient was deemed in                            satisfactory condition to undergo the procedure.                           After obtaining informed consent, the colonoscope                            was passed under direct vision. Throughout the                            procedure, the patient's blood pressure, pulse, and                    oxygen saturations were monitored continuously. The                            EC-[id number] ([id number] scope was introduced through                            the anus and advanced to the the cecum, identified                            by appendiceal orifice and ileocecal valve. The                            colonoscopy was technically difficult and complex                            due to multiple diverticula in the colon,                            significant looping and a tortuous colon.                            Successful completion of the procedure was aided by                            changing the patient to a supine position and using                            manual pressure. The quality of the bowel                            preparation was excellent. The ileocecal valve,                            appendiceal orifice, and rectum were photographed.                            The bowel preparation used was MoviPrep. Scope In: 8:39:05 AM Scope Out: 9:02:32 AM Scope Withdrawal Time: 0 hours 6 minutes 3 seconds  Total Procedure Duration: 0 hours 23 minutes 27 seconds  Findings:      The perianal and digital rectal examinations were normal.      Multiple medium-mouthed diverticula were found in the entire colon, with       severe sigmoid tortuosity and redundancy. There was no evidence of       diverticular bleeding.      The exam was otherwise without abnormality on direct and retroflexion       views. Impression:               - Diverticulosis in the entire examined colon.                            There was no evidence of diverticular bleeding.                           - The examination was otherwise normal on direct                            and retroflexion views.                           - No specimens collected. Moderate Sedation:      Moderate (conscious) sedation was administered by the endoscopy nurse       and supervised by the endoscopist. The  following parameters were       monitored: oxygen saturation, heart rate, blood pressure, respiratory       rate, EKG, adequacy of pulmonary ventilation, and response to care.       Total physician intraservice time was 26 minutes. Recommendation:           - Resume regular diet.                           - Discharge patient to home today.                           - Repeat colonoscopy in 10 years for screening                            purposes.                           - Continue present medications. Procedure Code(s):        --- Professional ---                           [id number], Colonoscopy, flexible; diagnostic, including                            collection of specimen(s) by brushing or washing,                            when performed (separate procedure)                           99152, Moderate sedation services provided by the  same physician or other qualified health care                            professional performing the diagnostic or                            therapeutic service that the sedation supports,                            requiring the presence of an independent trained                            observer to assist in the monitoring of the                            patient's level of consciousness and physiological                            status; initial 15 minutes of intraservice time,                            patient age 64 years or older                           [id number], Moderate sedation services; each additional                            15 minutes intraservice time Diagnosis Code(s):        --- Professional ---                           K92.1, Melena (includes Hematochezia)                           D62, Acute posthemorrhagic anemia                           K57.30, Diverticulosis of large intestine without                            perforation or abscess without bleeding CPT copyright 2016 American Medical Association.  All rights reserved. The codes documented in this report are preliminary and upon coder review may  be revised to meet current compliance requirements. Tahra Hitzeman L. Myrtie Neitheranis, MD 03/04/2016 9:16:52 AM This report has been signed electronically. Number of Addenda: 0

## 2016-03-04 NOTE — Discharge Summary (Signed)
Discharge Summary  Judy Carlson ZOX:[id number] DOB: 1950/04/21  PCP: Nilda Simmer, MD  Admit date: 03/02/2016 Discharge date: 03/04/2016  Time spent: 25 minutes   Recommendations for Outpatient Follow-up:  1. Patient taking both mobic and aspirin. Advised to stop taking him aerobic. 2. She'll follow-up with her PCP in the next one month   Discharge Diagnoses:  Active Hospital Problems   Diagnosis Date Noted  . Diverticulosis of colon with hemorrhage   . Hematochezia   . GI bleed 03/02/2016  . Acute blood loss anemia 03/02/2016  . Essential hypertension, benign 02/04/2012  . TOBACCO ABUSE 05/06/2007    Resolved Hospital Problems   Diagnosis Date Noted Date Resolved  No resolved problems to display.    Discharge Condition: Improved, being discharged home   Diet recommendation: Low-sodium   Vitals:   03/04/16 0920 03/04/16 0934  BP: (!) 87/34 (!) 107/50  Pulse: 70 68  Resp: 17 20  Temp:  97.7 F (36.5 C)    History of present illness:  65 year old female with past medical history of diverticulosis, hypertension and TIAs on aspirin he started noting increased number of stools as well as bright red blood in her stools starting on the night of 12/28 going into the next day. She became concerned and came into the emergency room. Patient's blood work have been checked several days prior in the office and was noted to be 14.1. The emergency room on 12/29, hemoglobin of 9.6. Over the next 12 hours, hemoglobin had dropped to 7.4.   Hospital Course:  Active Problems:   TOBACCO ABUSE: Declined nicotine patch. Counseled to quit.   Essential hypertension, benign: Continue home medications     Diverticulosis of colon with hemorrhage causing acute blood loss anemia: On afternoon of 12/30, hemoglobin dropped to 6.8. Patient transfused 2 units packed red blood cells. By that night, hemoglobin up to 10.8. Seen by gastroenterology and patient underwent colonoscopy on the morning of  12/31. No evidence of acute active bleeding noted. Diverticula noted. Bleeding felt to be secondary to diverticulosis and no longer active. Patient cleared to go home and resume normal diet. She is on Mobic and aspirin, so advised her to stop taking him mobic.  Bronchitis: Patient given prescription for antibiotics prior to admission the day before. She will continue these antibiotics after discharge  Procedures:  Colonoscopy done 12/31: Diverticula noted. No evidence of active bleeding   Consultations:  [facility]-Dr. Myrtie Neither   Discharge Exam: BP (!) 107/50 (BP Location: Right Arm)   Pulse 68   Temp 97.7 F (36.5 C) (Oral)   Resp 20   Ht 5\' 5"  (1.651 m)   Wt 76.8 kg (169 lb 4.8 oz)   SpO2 98%   BMI 28.17 kg/m   General: Alert and oriented 3, no acute distress  Cardiovascular: Regular rate and rhythm, S1-S2  Respiratory: Clear to auscultation bilaterally   Discharge Instructions You were cared for by a hospitalist during your hospital stay. If you have any questions about your discharge medications or the care you received while you were in the hospital after you are discharged, you can call the unit and asked to speak with the hospitalist on call if the hospitalist that took care of you is not available. Once you are discharged, your primary care physician will handle any further medical issues. Please note that NO REFILLS for any discharge medications will be authorized once you are discharged, as it is imperative that you return to your primary care physician (or establish  a relationship with a primary care physician if you do not have one) for your aftercare needs so that they can reassess your need for medications and monitor your lab values.  Discharge Instructions    Diet - low sodium heart healthy    Complete by:  As directed    Increase activity slowly    Complete by:  As directed      Allergies as of 03/04/2016      Reactions   Doxycycline Other (See Comments)    Doesn't remember    Lipitor [atorvastatin]    Myalgias/feet pain   Lisinopril Swelling   Tongue   Naproxen Other (See Comments)   Doesn't remember    Penicillins Other (See Comments)   Doesn't remember    Prednisone Other (See Comments)   Toes numb   Clindamycin/lincomycin Rash      Medication List    STOP taking these medications   meloxicam 15 MG tablet Commonly known as:  MOBIC     TAKE these medications   amLODipine 10 MG tablet Commonly known as:  NORVASC Take 1 tablet (10 mg total) by mouth daily.   aspirin 81 MG EC tablet Take 1 tablet (81 mg total) by mouth daily.   azithromycin 250 MG tablet Commonly known as:  ZITHROMAX Two tablets daily x 1 day then one tablet daily x 4 days   benzonatate 100 MG capsule Commonly known as:  TESSALON Take 1-2 capsules (100-200 mg total) by mouth 3 (three) times daily as needed for cough.   furosemide 40 MG tablet Commonly known as:  LASIX TAKE ONE TABLET BY MOUTH ONCE DAILY   metoprolol succinate 100 MG 24 hr tablet Commonly known as:  TOPROL-XL TAKE ONE TABLET BY MOUTH ONCE DAILY TAKE WITH OR IMMEDIATELY FOLLOWING A MEAL.   MUCINEX DM MAXIMUM STRENGTH 60-1200 MG Tb12 Take 1 tablet by mouth every 12 (twelve) hours.   pravastatin 40 MG tablet Commonly known as:  PRAVACHOL TAKE ONE TABLET BY MOUTH ONCE DAILY   Vitamin D 2000 units Caps Take 2,000 Units by mouth daily.      Allergies  Allergen Reactions  . Doxycycline Other (See Comments)    Doesn't remember   . Lipitor [Atorvastatin]     Myalgias/feet pain  . Lisinopril Swelling    Tongue  . Naproxen Other (See Comments)    Doesn't remember   . Penicillins Other (See Comments)    Doesn't remember   . Prednisone Other (See Comments)    Toes numb  . Clindamycin/Lincomycin Rash   Follow-up Information    SMITH,KRISTI, MD Follow up in 1 month(s).   Specialty:  Family Medicine Contact information: [address] [place] [address] [phone]              The results of significant diagnostics from this hospitalization (including imaging, microbiology, ancillary and laboratory) are listed below for reference.    Significant Diagnostic Studies: No results found.  Microbiology: Recent Results (from the past 240 hour(s))  Influenza a and b     Status: None   Collection Time: 02/28/16  9:38 AM  Result Value Ref Range Status   Influenza A Ag, EIA Negative Negative Final   Influenza B Ag, EIA Negative Negative Final   Influenza Comment See note  Final     Labs: Basic Metabolic Panel:  Recent Labs Lab 02/28/16 0932 03/02/16 1715 03/03/16 0201  NA 143 139 138  K 4.9 3.5 3.2*  CL 103 107 110  CO2 24 25 21*  GLUCOSE 100* 113* 107*  BUN 26 26* 26*  CREATININE 1.33* 0.88 0.88  CALCIUM 9.7 8.6* 8.3*  MG  --   --  2.2  PHOS  --   --  3.1   Liver Function Tests:  Recent Labs Lab 02/28/16 0932 03/02/16 1715 03/03/16 0201  AST 22 19 17   ALT 22 19 15   ALKPHOS 86 60 53  BILITOT 0.2 0.5 0.7  PROT 7.0 6.5 5.6*  ALBUMIN 4.3 3.6 3.5   No results for input(s): LIPASE, AMYLASE in the last 168 hours. No results for input(s): AMMONIA in the last 168 hours. CBC:  Recent Labs Lab 02/28/16 0932 03/02/16 1715 03/03/16 0201 03/03/16 0710 03/03/16 1253 03/03/16 2236  WBC 5.1 5.2 6.2 5.3 5.4 8.4  NEUTROABS 2.6  --   --   --   --   --   HGB  --  9.6* 8.1* 7.4* 6.8* 10.1*  HCT 42.2 28.6* 23.9* 21.5* 20.1* 29.3*  MCV 81 80.1 79.9 80.2 79.4 82.5  PLT 273 257 234 210 213 228   Cardiac Enzymes: No results for input(s): CKTOTAL, CKMB, CKMBINDEX, TROPONINI in the last 168 hours. BNP: BNP (last 3 results) No results for input(s): BNP in the last 8760 hours.  ProBNP (last 3 results) No results for input(s): PROBNP in the last 8760 hours.  CBG: No results for input(s): GLUCAP in the last 168 hours.     Signed:  Hollice EspyKRISHNAN,SENDIL K, MD [facility] 03/04/2016, 1:49 PM
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
## 2016-03-04 NOTE — Interval H&P Note (Signed)
History and Physical Interval Note:  03/04/2016 8:24 AM  Judy Carlson  has presented today for surgery, with the diagnosis of gastrointestinal bleeding  The various methods of treatment have been discussed with the patient and family. After consideration of risks, benefits and other options for treatment, the patient has consented to  Procedure(s): COLONOSCOPY (N/A) as a surgical intervention .  The patient's history has been reviewed, patient examined, no change in status, stable for surgery.  I have reviewed the patient's chart and labs.  Questions were answered to the patient's satisfaction.     Charlie PitterHenry L Danis III

## 2016-03-06 ENCOUNTER — Encounter (HOSPITAL_COMMUNITY): Payer: Self-pay | Admitting: Gastroenterology

## 2016-03-06 ENCOUNTER — Other Ambulatory Visit: Payer: Self-pay | Admitting: Family Medicine

## 2016-03-06 LAB — TYPE AND SCREEN
ABO/RH(D): A POS
Antibody Screen: NEGATIVE
Unit division: 0
Unit division: 0

## 2016-03-06 NOTE — Telephone Encounter (Signed)
Lm looks like patient went to ED on 12-29

## 2016-04-03 ENCOUNTER — Ambulatory Visit: Payer: Commercial Managed Care - HMO | Admitting: Family Medicine

## 2016-04-16 ENCOUNTER — Other Ambulatory Visit: Payer: Self-pay | Admitting: Family Medicine

## 2016-04-17 NOTE — Telephone Encounter (Signed)
Last OV with Dr. Katrinka BlazingSmith was 02/28/2016. Refills provided for an additional 9 months. Patient does not have allergic reaction to statins, she has myalgia and foot pain with atorvastatin. Refill is for pravastatin.

## 2016-05-12 ENCOUNTER — Other Ambulatory Visit: Payer: Self-pay | Admitting: Family Medicine

## 2016-05-13 NOTE — Telephone Encounter (Signed)
02/2016 last ov, lab abnormal potassium.

## 2016-06-04 ENCOUNTER — Other Ambulatory Visit: Payer: Self-pay | Admitting: Family Medicine

## 2016-07-05 ENCOUNTER — Other Ambulatory Visit: Payer: Self-pay | Admitting: Family Medicine

## 2016-07-10 ENCOUNTER — Ambulatory Visit (INDEPENDENT_AMBULATORY_CARE_PROVIDER_SITE_OTHER): Payer: Medicare HMO | Admitting: Family Medicine

## 2016-07-10 ENCOUNTER — Encounter: Payer: Self-pay | Admitting: Family Medicine

## 2016-07-10 ENCOUNTER — Other Ambulatory Visit: Payer: Self-pay | Admitting: Family Medicine

## 2016-07-10 VITALS — BP 125/77 | HR 63 | Temp 97.8°F | Resp 18 | Ht 65.0 in | Wt 172.0 lb

## 2016-07-10 DIAGNOSIS — D62 Acute posthemorrhagic anemia: Secondary | ICD-10-CM | POA: Diagnosis not present

## 2016-07-10 DIAGNOSIS — K921 Melena: Secondary | ICD-10-CM

## 2016-07-10 DIAGNOSIS — E7800 Pure hypercholesterolemia, unspecified: Secondary | ICD-10-CM | POA: Diagnosis not present

## 2016-07-10 DIAGNOSIS — I10 Essential (primary) hypertension: Secondary | ICD-10-CM | POA: Diagnosis not present

## 2016-07-10 DIAGNOSIS — M25562 Pain in left knee: Secondary | ICD-10-CM

## 2016-07-10 DIAGNOSIS — I878 Other specified disorders of veins: Secondary | ICD-10-CM

## 2016-07-10 DIAGNOSIS — R7302 Impaired glucose tolerance (oral): Secondary | ICD-10-CM

## 2016-07-10 DIAGNOSIS — Z1231 Encounter for screening mammogram for malignant neoplasm of breast: Secondary | ICD-10-CM

## 2016-07-10 DIAGNOSIS — M25561 Pain in right knee: Secondary | ICD-10-CM | POA: Diagnosis not present

## 2016-07-10 MED ORDER — FUROSEMIDE 40 MG PO TABS: 40.0000 mg | ORAL_TABLET | Freq: Every day | ORAL | 1 refills | Status: DC

## 2016-07-10 MED ORDER — METOPROLOL SUCCINATE ER 100 MG PO TB24: ORAL_TABLET | ORAL | 1 refills | Status: DC

## 2016-07-10 MED ORDER — AMLODIPINE BESYLATE 10 MG PO TABS: 10.0000 mg | ORAL_TABLET | Freq: Every day | ORAL | 1 refills | Status: DC
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
## 2016-07-10 NOTE — Patient Instructions (Addendum)
IF you received an x-ray today, you will receive an invoice from [facility]. Please contact [facility] at [phone] with questions or concerns regarding your invoice.   IF you received labwork today, you will receive an invoice from [facility]. Please contact [facility] at [phone] with questions or concerns regarding your invoice.   Our billing staff will not be able to assist you with questions regarding bills from these companies.  You will be contacted with the lab results as soon as they are available. The fastest way to get your results is to activate your My Chart account. Instructions are located on the last page of this paperwork. If you have not heard from [place] regarding the results in 2 weeks, please contact this office.    me Fat and Cholesterol Restricted Diet Getting too much fat and cholesterol in your diet may cause health problems. Following this diet helps keep your fat and cholesterol at normal levels. This can keep you from getting sick. What types of fat should I choose?  Choose monosaturated and polyunsaturated fats. These are found in foods such as olive oil, canola oil, flaxseeds, walnuts, almonds, and seeds.  Eat more omega-3 fats. Good choices include salmon, mackerel, sardines, tuna, flaxseed oil, and ground flaxseeds.  Limit saturated fats. These are in animal products such as meats, butter, and cream. They can also be in plant products such as palm oil, palm kernel oil, and coconut oil.  Avoid foods with partially hydrogenated oils in them. These contain trans fats. Examples of foods that have trans fats are stick margarine, some tub margarines, cookies, crackers, and other baked goods. What general guidelines do I need to follow?  Check food labels. Look for the words "trans fat" and "saturated fat."  When preparing a meal:  Fill half of your plate with vegetables and green salads.  Fill one fourth of your plate with whole  grains. Look for the word "whole" as the first word in the ingredient list.  Fill one fourth of your plate with lean protein foods.  Eat more foods that have fiber, like apples, carrots, beans, peas, and barley.  Eat more home-cooked foods. Eat less at restaurants and buffets.  Limit or avoid alcohol.  Limit foods high in starch and sugar.  Limit fried foods.  Cook foods without frying them. Baking, boiling, grilling, and broiling are all great options.  Lose weight if you are overweight. Losing even a small amount of weight can help your overall health. It can also help prevent diseases such as diabetes and heart disease. What foods can I eat? Grains  Whole grains, such as whole wheat or whole grain breads, crackers, cereals, and pasta. Unsweetened oatmeal, bulgur, barley, quinoa, or brown rice. Corn or whole wheat flour tortillas. Vegetables  Fresh or frozen vegetables (raw, steamed, roasted, or grilled). Green salads. Fruits  All fresh, canned (in natural juice), or frozen fruits. Meat and Other Protein Products  Ground beef (85% or leaner), grass-fed beef, or beef trimmed of fat. Skinless chicken or [place]. Ground chicken or [place]. Pork trimmed of fat. All fish and seafood. Eggs. Dried beans, peas, or lentils. Unsalted nuts or seeds. Unsalted canned or dry beans. Dairy  Low-fat dairy products, such as skim or 1% milk, 2% or reduced-fat cheeses, low-fat ricotta or cottage cheese, or plain low-fat yogurt. Fats and Oils  Tub margarines without trans fats. Light or reduced-fat mayonnaise and salad dressings. Avocado. Olive, canola, sesame, or safflower oils. Natural peanut or  almond butter (choose ones without added sugar and oil). The items listed above may not be a complete list of recommended foods or beverages. Contact your dietitian for more options.  What foods are not recommended? Grains  White bread. White pasta. White rice. Cornbread. Bagels, pastries, and croissants.  Crackers that contain trans fat. Vegetables  White potatoes. Corn. Creamed or fried vegetables. Vegetables in a cheese sauce. Fruits  Dried fruits. Canned fruit in light or heavy syrup. Fruit juice. Meat and Other Protein Products  Fatty cuts of meat. Ribs, chicken wings, bacon, sausage, bologna, salami, chitterlings, fatback, hot dogs, bratwurst, and packaged luncheon meats. Liver and organ meats. Dairy  Whole or 2% milk, cream, half-and-half, and cream cheese. Whole milk cheeses. Whole-fat or sweetened yogurt. Full-fat cheeses. Nondairy creamers and whipped toppings. Processed cheese, cheese spreads, or cheese curds. Sweets and Desserts  Corn syrup, sugars, honey, and molasses. Candy. Jam and jelly. Syrup. Sweetened cereals. Cookies, pies, cakes, donuts, muffins, and ice cream. Fats and Oils  Butter, stick margarine, lard, shortening, ghee, or bacon fat. Coconut, palm kernel, or palm oils. Beverages  Alcohol. Sweetened drinks (such as sodas, lemonade, and fruit drinks or punches). The items listed above may not be a complete list of foods and beverages to avoid. Contact your dietitian for more information.  This information is not intended to replace advice given to you by your health care provider. Make sure you discuss any questions you have with your health care provider. Document Released: 08/21/2011 Document Revised: 10/27/2015 Document Reviewed: 05/21/2013 Elsevier Interactive Patient Education  2017 [employer]

## 2016-07-11 LAB — LIPID PANEL
CHOLESTEROL TOTAL: 221 mg/dL — AB (ref 100–199)
Chol/HDL Ratio: 2.9 ratio (ref 0.0–4.4)
HDL: 76 mg/dL (ref >39)
LDL Calculated: 130 mg/dL — ABNORMAL HIGH (ref 0–99)
Triglycerides: 74 mg/dL (ref 0–149)
VLDL CHOLESTEROL CAL: 15 mg/dL (ref 5–40)

## 2016-07-11 LAB — CBC WITH DIFFERENTIAL/PLATELET
Basophils Absolute: 0 10*3/uL (ref 0.0–0.2)
Basos: 1 %
EOS (ABSOLUTE): 0.1 10*3/uL (ref 0.0–0.4)
Eos: 2 %
HEMOGLOBIN: 12.5 g/dL (ref 11.1–15.9)
Hematocrit: 38.3 % (ref 34.0–46.6)
IMMATURE GRANS (ABS): 0 10*3/uL (ref 0.0–0.1)
Immature Granulocytes: 0 %
LYMPHS: 34 %
Lymphocytes Absolute: 1.9 10*3/uL (ref 0.7–3.1)
MCH: 25.8 pg — AB (ref 26.6–33.0)
MCHC: 32.6 g/dL (ref 31.5–35.7)
MCV: 79 fL (ref 79–97)
MONOCYTES: 15 %
Monocytes Absolute: 0.8 10*3/uL (ref 0.1–0.9)
NEUTROS ABS: 2.7 10*3/uL (ref 1.4–7.0)
Neutrophils: 48 %
PLATELETS: 367 10*3/uL (ref 150–379)
RBC: 4.84 x10E6/uL (ref 3.77–5.28)
RDW: 19.2 % — ABNORMAL HIGH (ref 12.3–15.4)
WBC: 5.4 10*3/uL (ref 3.4–10.8)

## 2016-07-11 LAB — COMPREHENSIVE METABOLIC PANEL
ALT: 19 IU/L (ref 0–32)
AST: 17 IU/L (ref 0–40)
Albumin/Globulin Ratio: 1.6 (ref 1.2–2.2)
Albumin: 4.4 g/dL (ref 3.6–4.8)
Alkaline Phosphatase: 108 IU/L (ref 39–117)
BUN/Creatinine Ratio: 19 (ref 12–28)
BUN: 14 mg/dL (ref 8–27)
Bilirubin Total: <0.2 mg/dL (ref 0.0–1.2)
CO2: 23 mmol/L (ref 18–29)
Calcium: 10 mg/dL (ref 8.7–10.3)
Chloride: 102 mmol/L (ref 96–106)
Creatinine, Ser: 0.75 mg/dL (ref 0.57–1.00)
GFR calc Af Amer: 96 mL/min/{1.73_m2} (ref >59)
GFR calc non Af Amer: 83 mL/min/{1.73_m2} (ref >59)
Globulin, Total: 2.7 g/dL (ref 1.5–4.5)
Glucose: 94 mg/dL (ref 65–99)
Potassium: 4.3 mmol/L (ref 3.5–5.2)
Sodium: 141 mmol/L (ref 134–144)
Total Protein: 7.1 g/dL (ref 6.0–8.5)

## 2016-07-11 LAB — IRON: IRON: 45 ug/dL (ref 27–139)

## 2016-07-11 LAB — HEMOGLOBIN A1C
ESTIMATED AVERAGE GLUCOSE: 114 mg/dL
HEMOGLOBIN A1C: 5.6 % (ref 4.8–5.6)

## 2016-07-20 ENCOUNTER — Ambulatory Visit
Admission: RE | Admit: 2016-07-20 | Discharge: 2016-07-20 | Disposition: A | Discharge status: Home / Self Care | Payer: Medicare HMO | Source: Ambulatory Visit | Attending: Family Medicine | Admitting: Family Medicine

## 2016-07-20 DIAGNOSIS — Z1231 Encounter for screening mammogram for malignant neoplasm of breast: Secondary | ICD-10-CM | POA: Diagnosis not present

## 2016-07-24 ENCOUNTER — Telehealth: Payer: Self-pay | Admitting: Family Medicine

## 2016-07-24 NOTE — Telephone Encounter (Signed)
Please place if appropriate 

## 2016-07-24 NOTE — Telephone Encounter (Signed)
Pt calling in regards to referral she said was discussed at last appt to [facility] [place] for [place]th[place]. I did not see referral but will send once referral is placed and notes are done if appropriate. Thanks!

## 2016-07-27 NOTE — Telephone Encounter (Signed)
Can we get referral placed for Rheumatology if appropriate? I do not see any referrals from our office. Thanks!

## 2016-07-27 NOTE — Telephone Encounter (Signed)
Note completed; please process referral. Thanks.

## 2016-07-27 NOTE — Telephone Encounter (Signed)
Pt following up on referral. Please advise

## 2016-08-03 NOTE — Telephone Encounter (Signed)
So sorry; referral has been placed for rheumatology at [facility] in [place].

## 2016-08-03 NOTE — Addendum Note (Signed)
Addended by: Ethelda ChickSMITH, KRISTI M on: 08/03/2016 11:08 AM   Modules accepted: Orders

## 2016-08-03 NOTE — Telephone Encounter (Signed)
Referral sent. Thanks.

## 2016-08-15 DIAGNOSIS — M2550 Pain in unspecified joint: Secondary | ICD-10-CM | POA: Insufficient documentation

## 2016-08-15 DIAGNOSIS — I7300 Raynaud's syndrome without gangrene: Secondary | ICD-10-CM | POA: Insufficient documentation

## 2016-09-01 ENCOUNTER — Other Ambulatory Visit: Payer: Self-pay | Admitting: Family Medicine

## 2016-09-14 ENCOUNTER — Telehealth: Payer: Self-pay | Admitting: Family Medicine

## 2016-09-14 NOTE — Telephone Encounter (Signed)
Please call patient and clarify what medication she wants refilled.

## 2016-09-14 NOTE — Telephone Encounter (Signed)
Pt is needing a refill on certaline?  Best number is [phone]

## 2016-09-21 ENCOUNTER — Other Ambulatory Visit: Payer: Self-pay | Admitting: Family Medicine

## 2016-10-04 NOTE — Telephone Encounter (Signed)
Spoke with pt about refill request and she would like to know if she could get her Sertraline 50 mg refilled. She has not took this medication since 02/04/2015 but pt states she needs something and would like to know what you would recommend.  The Sertraline was giving her bad dreams but pt states she needs something. Please Advise.

## 2016-10-04 NOTE — Telephone Encounter (Signed)
Called pt about Medications and LVM for her to call office back.

## 2016-10-06 NOTE — Telephone Encounter (Signed)
Please call -- patient needs appointment with me to receive rx for Sertraline or similar medication since she has not taken anything in over one year.

## 2016-10-09 ENCOUNTER — Telehealth: Payer: Self-pay | Admitting: Family Medicine

## 2016-10-09 NOTE — Telephone Encounter (Signed)
SPOKE WITH PT TO MAKE AN APPOINTMENT WITH DR Katrinka BlazingSMITH FOR REFILL ON  SERTRALINE OR SIMILAR MEDICINE SHE STATES THAT ITS OK THAT SHE HAD BEEN WITHOUT IT SINCE SHE REQUESTED IT A MONTH AGO AND WOULD JUST GO WITHOUT IT SINCE ITS BEEN THIS LONG WITHOUT A REFILL

## 2017-01-15 ENCOUNTER — Ambulatory Visit: Payer: Medicare HMO | Admitting: Family Medicine

## 2017-01-15 ENCOUNTER — Encounter: Payer: Medicare HMO | Admitting: Family Medicine

## 2017-01-22 ENCOUNTER — Other Ambulatory Visit: Payer: Self-pay | Admitting: Urgent Care

## 2017-02-05 ENCOUNTER — Other Ambulatory Visit: Payer: Self-pay | Admitting: Family Medicine

## 2017-02-06 ENCOUNTER — Telehealth: Payer: Self-pay | Admitting: Family Medicine

## 2017-02-06 NOTE — Telephone Encounter (Signed)
Pt states she did not receive a letter concerning this appt. Pt thought she had the original appt Pt rescheduled and will need her refills now because she does not have enough med to get her through. Pt has appt 12/14 and will do labs day prior.  amLODipine (NORVASC) 10 MG tablet furosemide (LASIX) 40 MG tablet metoprolol succinate (TOPROL-XL) 100 MG 24 hr tablet   [facility] [place], [place] - [address] [phone] (Phone) [phone] (Fax)

## 2017-02-06 NOTE — Telephone Encounter (Signed)
Attempted to contact pt regarding prescription refills request; per Dr Michaelle CopasSmith's note 07/10/16, pt needs to be seen around 01/10/17; no upcoming appointments scheduled; left message at [phone]

## 2017-02-13 ENCOUNTER — Other Ambulatory Visit: Payer: Self-pay | Admitting: Family Medicine

## 2017-02-13 ENCOUNTER — Ambulatory Visit: Payer: Medicare HMO | Admitting: Emergency Medicine

## 2017-02-13 DIAGNOSIS — I10 Essential (primary) hypertension: Secondary | ICD-10-CM

## 2017-02-13 DIAGNOSIS — R7302 Impaired glucose tolerance (oral): Secondary | ICD-10-CM | POA: Diagnosis not present

## 2017-02-13 DIAGNOSIS — E7800 Pure hypercholesterolemia, unspecified: Secondary | ICD-10-CM

## 2017-02-14 DIAGNOSIS — M2351 Chronic instability of knee, right knee: Secondary | ICD-10-CM | POA: Diagnosis not present

## 2017-02-14 DIAGNOSIS — M357 Hypermobility syndrome: Secondary | ICD-10-CM | POA: Insufficient documentation

## 2017-02-14 DIAGNOSIS — M2550 Pain in unspecified joint: Secondary | ICD-10-CM | POA: Diagnosis not present

## 2017-02-14 DIAGNOSIS — M1611 Unilateral primary osteoarthritis, right hip: Secondary | ICD-10-CM | POA: Diagnosis not present

## 2017-02-14 DIAGNOSIS — I7300 Raynaud's syndrome without gangrene: Secondary | ICD-10-CM | POA: Diagnosis not present

## 2017-02-14 LAB — CBC WITH DIFFERENTIAL/PLATELET
BASOS: 0 %
Basophils Absolute: 0 10*3/uL (ref 0.0–0.2)
EOS (ABSOLUTE): 0.1 10*3/uL (ref 0.0–0.4)
EOS: 1 %
HEMATOCRIT: 41.2 % (ref 34.0–46.6)
Hemoglobin: 13.9 g/dL (ref 11.1–15.9)
Immature Grans (Abs): 0 10*3/uL (ref 0.0–0.1)
Immature Granulocytes: 0 %
LYMPHS ABS: 1.8 10*3/uL (ref 0.7–3.1)
Lymphs: 25 %
MCH: 27.7 pg (ref 26.6–33.0)
MCHC: 33.7 g/dL (ref 31.5–35.7)
MCV: 82 fL (ref 79–97)
MONOS ABS: 0.7 10*3/uL (ref 0.1–0.9)
Monocytes: 10 %
Neutrophils Absolute: 4.5 10*3/uL (ref 1.4–7.0)
Neutrophils: 64 %
Platelets: 338 10*3/uL (ref 150–379)
RBC: 5.01 x10E6/uL (ref 3.77–5.28)
RDW: 18 % — AB (ref 12.3–15.4)
WBC: 7 10*3/uL (ref 3.4–10.8)

## 2017-02-14 LAB — LIPID PANEL
CHOLESTEROL TOTAL: 231 mg/dL — AB (ref 100–199)
Chol/HDL Ratio: 2.9 ratio (ref 0.0–4.4)
HDL: 81 mg/dL (ref >39)
LDL CALC: 127 mg/dL — AB (ref 0–99)
Triglycerides: 114 mg/dL (ref 0–149)
VLDL CHOLESTEROL CAL: 23 mg/dL (ref 5–40)

## 2017-02-14 LAB — COMPREHENSIVE METABOLIC PANEL
A/G RATIO: 1.7 (ref 1.2–2.2)
ALK PHOS: 114 IU/L (ref 39–117)
ALT: 25 IU/L (ref 0–32)
AST: 22 IU/L (ref 0–40)
Albumin: 4.5 g/dL (ref 3.6–4.8)
BILIRUBIN TOTAL: 0.3 mg/dL (ref 0.0–1.2)
BUN/Creatinine Ratio: 13 (ref 12–28)
BUN: 11 mg/dL (ref 8–27)
CO2: 25 mmol/L (ref 20–29)
Calcium: 10.2 mg/dL (ref 8.7–10.3)
Chloride: 105 mmol/L (ref 96–106)
Creatinine, Ser: 0.86 mg/dL (ref 0.57–1.00)
GFR calc Af Amer: 81 mL/min/{1.73_m2} (ref >59)
GFR, EST NON AFRICAN AMERICAN: 71 mL/min/{1.73_m2} (ref >59)
GLOBULIN, TOTAL: 2.7 g/dL (ref 1.5–4.5)
Glucose: 96 mg/dL (ref 65–99)
POTASSIUM: 4.3 mmol/L (ref 3.5–5.2)
SODIUM: 144 mmol/L (ref 134–144)
Total Protein: 7.2 g/dL (ref 6.0–8.5)

## 2017-02-14 LAB — HEMOGLOBIN A1C
ESTIMATED AVERAGE GLUCOSE: 117 mg/dL
HEMOGLOBIN A1C: 5.7 % — AB (ref 4.8–5.6)

## 2017-02-15 ENCOUNTER — Encounter: Payer: Self-pay | Admitting: Family Medicine

## 2017-02-15 ENCOUNTER — Other Ambulatory Visit: Payer: Self-pay

## 2017-02-15 ENCOUNTER — Ambulatory Visit (INDEPENDENT_AMBULATORY_CARE_PROVIDER_SITE_OTHER): Payer: Medicare HMO | Admitting: Family Medicine

## 2017-02-15 VITALS — BP 118/70 | HR 67 | Temp 98.7°F | Resp 16 | Ht 66.14 in | Wt 178.0 lb

## 2017-02-15 DIAGNOSIS — Z23 Encounter for immunization: Secondary | ICD-10-CM | POA: Diagnosis not present

## 2017-02-15 DIAGNOSIS — E559 Vitamin D deficiency, unspecified: Secondary | ICD-10-CM

## 2017-02-15 DIAGNOSIS — F17200 Nicotine dependence, unspecified, uncomplicated: Secondary | ICD-10-CM | POA: Diagnosis not present

## 2017-02-15 DIAGNOSIS — I10 Essential (primary) hypertension: Secondary | ICD-10-CM | POA: Diagnosis not present

## 2017-02-15 DIAGNOSIS — F419 Anxiety disorder, unspecified: Secondary | ICD-10-CM | POA: Diagnosis not present

## 2017-02-15 DIAGNOSIS — F32A Depression, unspecified: Secondary | ICD-10-CM

## 2017-02-15 DIAGNOSIS — D500 Iron deficiency anemia secondary to blood loss (chronic): Secondary | ICD-10-CM | POA: Diagnosis not present

## 2017-02-15 DIAGNOSIS — E7800 Pure hypercholesterolemia, unspecified: Secondary | ICD-10-CM | POA: Diagnosis not present

## 2017-02-15 DIAGNOSIS — F329 Major depressive disorder, single episode, unspecified: Secondary | ICD-10-CM

## 2017-02-15 DIAGNOSIS — E2839 Other primary ovarian failure: Secondary | ICD-10-CM | POA: Diagnosis not present

## 2017-02-15 DIAGNOSIS — I878 Other specified disorders of veins: Secondary | ICD-10-CM

## 2017-02-15 DIAGNOSIS — Z0000 Encounter for general adult medical examination without abnormal findings: Secondary | ICD-10-CM

## 2017-02-15 DIAGNOSIS — R69 Illness, unspecified: Secondary | ICD-10-CM | POA: Diagnosis not present

## 2017-02-15 LAB — POCT URINALYSIS DIP (MANUAL ENTRY)
Blood, UA: NEGATIVE
Glucose, UA: NEGATIVE mg/dL
Ketones, POC UA: NEGATIVE mg/dL
NITRITE UA: NEGATIVE
PH UA: 6 (ref 5.0–8.0)
Spec Grav, UA: 1.02 (ref 1.010–1.025)
Urobilinogen, UA: 0.2 E.U./dL

## 2017-02-15 MED ORDER — FUROSEMIDE 40 MG PO TABS: 40.0000 mg | ORAL_TABLET | Freq: Every day | ORAL | 1 refills | Status: DC

## 2017-02-15 MED ORDER — PRAVASTATIN SODIUM 40 MG PO TABS: 40.0000 mg | ORAL_TABLET | Freq: Every day | ORAL | 3 refills | Status: DC

## 2017-02-15 MED ORDER — METOPROLOL SUCCINATE ER 100 MG PO TB24: ORAL_TABLET | ORAL | 1 refills | Status: DC

## 2017-02-15 MED ORDER — AMLODIPINE BESYLATE 10 MG PO TABS: 10.0000 mg | ORAL_TABLET | Freq: Every day | ORAL | 1 refills | Status: DC

## 2017-02-15 NOTE — Patient Instructions (Addendum)
   IF you received an x-ray today, you will receive an invoice from [facility]. Please contact [facility] at [phone] with questions or concerns regarding your invoice.   IF you received labwork today, you will receive an invoice from [facility]. Please contact [facility] at [phone] with questions or concerns regarding your invoice.   Our billing staff will not be able to assist you with questions regarding bills from these companies.  You will be contacted with the lab results as soon as they are available. The fastest way to get your results is to activate your My Chart account. Instructions are located on the last page of this paperwork. If you have not heard from us regarding the results in 2 weeks, please contact this office.      Preventive Care 66 Years and Older, Female Preventive care refers to lifestyle choices and visits with your health care provider that can promote health and wellness. What does preventive care include?  A yearly physical exam. This is also called an annual well check.  Dental exams once or twice a year.  Routine eye exams. Ask your health care provider how often you should have your eyes checked.  Personal lifestyle choices, including: ? Daily care of your teeth and gums. ? Regular physical activity. ? Eating a healthy diet. ? Avoiding tobacco and drug use. ? Limiting alcohol use. ? Practicing safe sex. ? Taking low-dose aspirin every day. ? Taking vitamin and mineral supplements as recommended by your health care provider. What happens during an annual well check? The services and screenings done by your health care provider during your annual well check will depend on your age, overall health, lifestyle risk factors, and family history of disease. Counseling Your health care provider may ask you questions about your:  Alcohol use.  Tobacco use.  Drug use.  Emotional well-being.  Home and relationship  well-being.  Sexual activity.  Eating habits.  History of falls.  Memory and ability to understand (cognition).  Work and work environment.  Reproductive health.  Screening You may have the following tests or measurements:  Height, weight, and BMI.  Blood pressure.  Lipid and cholesterol levels. These may be checked every 5 years, or more frequently if you are over 50 years old.  Skin check.  Lung cancer screening. You may have this screening every year starting at age 55 if you have a 30-pack-year history of smoking and currently smoke or have quit within the past 15 years.  Fecal occult blood test (FOBT) of the stool. You may have this test every year starting at age 50.  Flexible sigmoidoscopy or colonoscopy. You may have a sigmoidoscopy every 5 years or a colonoscopy every 10 years starting at age 50.  Hepatitis C blood test.  Hepatitis B blood test.  Sexually transmitted disease (STD) testing.  Diabetes screening. This is done by checking your blood sugar (glucose) after you have not eaten for a while (fasting). You may have this done every 1-3 years.  Bone density scan. This is done to screen for osteoporosis. You may have this done starting at age 65.  Mammogram. This may be done every 1-2 years. Talk to your health care provider about how often you should have regular mammograms.  Talk with your health care provider about your test results, treatment options, and if necessary, the need for more tests. Vaccines Your health care provider may recommend certain vaccines, such as:  Influenza vaccine. This is recommended every year.    Tetanus, diphtheria, and acellular pertussis (Tdap, Td) vaccine. You may need a Td booster every 10 years.  Varicella vaccine. You may need this if you have not been vaccinated.  Zoster vaccine. You may need this after age 60.  Measles, mumps, and rubella (MMR) vaccine. You may need at least one dose of MMR if you were born in  1957 or later. You may also need a second dose.  Pneumococcal 13-valent conjugate (PCV13) vaccine. One dose is recommended after age 65.  Pneumococcal polysaccharide (PPSV23) vaccine. One dose is recommended after age 65.  Meningococcal vaccine. You may need this if you have certain conditions.  Hepatitis A vaccine. You may need this if you have certain conditions or if you travel or work in places where you may be exposed to hepatitis A.  Hepatitis B vaccine. You may need this if you have certain conditions or if you travel or work in places where you may be exposed to hepatitis B.  Haemophilus influenzae type b (Hib) vaccine. You may need this if you have certain conditions.  Talk to your health care provider about which screenings and vaccines you need and how often you need them. This information is not intended to replace advice given to you by your health care provider. Make sure you discuss any questions you have with your health care provider. Document Released: 03/18/2015 Document Revised: 11/09/2015 Document Reviewed: 12/21/2014 Elsevier Interactive Patient Education  2017 Elsevier Inc.  
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
## 2017-03-15 DIAGNOSIS — M25362 Other instability, left knee: Secondary | ICD-10-CM | POA: Diagnosis not present

## 2017-03-15 DIAGNOSIS — M25561 Pain in right knee: Secondary | ICD-10-CM | POA: Diagnosis not present

## 2017-03-15 DIAGNOSIS — M25562 Pain in left knee: Secondary | ICD-10-CM | POA: Diagnosis not present

## 2017-03-15 DIAGNOSIS — M25361 Other instability, right knee: Secondary | ICD-10-CM | POA: Diagnosis not present

## 2017-03-15 DIAGNOSIS — M170 Bilateral primary osteoarthritis of knee: Secondary | ICD-10-CM | POA: Diagnosis not present

## 2017-03-20 ENCOUNTER — Encounter: Payer: Self-pay | Admitting: Family Medicine

## 2017-04-16 ENCOUNTER — Ambulatory Visit: Payer: Self-pay | Admitting: *Deleted

## 2017-04-16 NOTE — Telephone Encounter (Signed)
Pt called with complaints of chest pain that started a couple of weeks ago; described as intermittent; she noticed that when she has the pain she is tired the next day; pt states that she has had angina but the pain does not feel like this (it used to feel like a gush in her chest now it feels like spasms); pt states she had a slight episode 04/15/17 at approximately 1630; per nurse triage recommendations pt instructed to see physician within 24 hours; pt offered and accepted appointment with Deliah BostonMichael Clark, [address] at [facility] 04/17/17 at 0920; pt verbalizes understanding.  Reason for Disposition . [1] Chest pain lasting <= 5 minutes AND [2] NO chest pain or cardiac symptoms now(Exceptions: pains lasting a few seconds)  Answer Assessment - Initial Assessment Questions 1. LOCATION: "Where does it hurt?"       To the right of her heart (in between her breast) 2. RADIATION: "Does the pain go anywhere else?" (e.g., into neck, jaw, arms, back)     penetrates through chest to her back; also has left shoulder weakness after the events 3. ONSET: "When did the chest pain begin?" (Minutes, hours or days)      Weeks 04/01/17 4. PATTERN "Does the pain come and go, or has it been constant since it started?"  "Does it get worse with exertion?"      intermittent 5. DURATION: "How long does it last" (e.g., seconds, minutes, hours)     App 5 seconds  6. SEVERITY: "How bad is the pain?"  (e.g., Scale 1-10; mild, moderate, or severe)    - MILD (1-3): doesn't interfere with normal activities     - MODERATE (4-7): interferes with normal activities or awakens from sleep    - SEVERE (8-10): excruciating pain, unable to do any normal activities       Mild but has had one that made her sit down  7. CARDIAC RISK FACTORS: "Do you have any history of heart problems or risk factors for heart disease?" (e.g., prior heart attack, angina; high blood pressure, diabetes, being overweight, high cholesterol, smoking, or strong family  history of heart disease)     Angina, weight gain, smoker, high blood pressure 8. PULMONARY RISK FACTORS: "Do you have any history of lung disease?"  (e.g., blood clots in lung, asthma, emphysema, birth control pills)     no 9. CAUSE: "What do you think is causing the chest pain?"     Indigestion? Unsure; feels it is associated with bile 10. OTHER SYMPTOMS: "Do you have any other symptoms?" (e.g., dizziness, nausea, vomiting, sweating, fever, difficulty breathing, cough)      Low grade fever, nausea; happen before pain or after but does not get every time she has the pain 11. PREGNANCY: "Is there any chance you are pregnant?" "When was your last menstrual period?"       No  Protocols used: CHEST PAIN-A-AH

## 2017-04-17 ENCOUNTER — Encounter: Payer: Self-pay | Admitting: Physician Assistant

## 2017-04-17 ENCOUNTER — Other Ambulatory Visit: Payer: Self-pay

## 2017-04-17 ENCOUNTER — Ambulatory Visit (INDEPENDENT_AMBULATORY_CARE_PROVIDER_SITE_OTHER): Payer: Medicare HMO | Admitting: Physician Assistant

## 2017-04-17 VITALS — BP 127/85 | HR 64 | Temp 97.7°F | Resp 16 | Ht 66.14 in | Wt 179.0 lb

## 2017-04-17 DIAGNOSIS — I10 Essential (primary) hypertension: Secondary | ICD-10-CM | POA: Diagnosis not present

## 2017-04-17 DIAGNOSIS — R079 Chest pain, unspecified: Secondary | ICD-10-CM

## 2017-04-17 DIAGNOSIS — R69 Illness, unspecified: Secondary | ICD-10-CM | POA: Diagnosis not present

## 2017-04-17 DIAGNOSIS — E7800 Pure hypercholesterolemia, unspecified: Secondary | ICD-10-CM | POA: Diagnosis not present

## 2017-04-17 DIAGNOSIS — R0789 Other chest pain: Secondary | ICD-10-CM | POA: Diagnosis not present

## 2017-04-17 NOTE — Patient Instructions (Addendum)
Dr Jacinto HalimGanji [address], [address] [place], [place] [address]    IF you received an x-ray today, you will receive an invoice from [facility]. Please contact [facility] at [phone] with questions or concerns regarding your invoice.   IF you received labwork today, you will receive an invoice from [facility]. Please contact [facility] at [phone] with questions or concerns regarding your invoice.   Our billing staff will not be able to assist you with questions regarding bills from these companies.  You will be contacted with the lab results as soon as they are available. The fastest way to get your results is to activate your My Chart account. Instructions are located on the last page of this paperwork. If you have not heard from us regarding the results in 2 weeks, please contact this office.
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
## 2017-04-18 LAB — BASIC METABOLIC PANEL
BUN/Creatinine Ratio: 13 (ref 12–28)
BUN: 12 mg/dL (ref 8–27)
CALCIUM: 10.4 mg/dL — AB (ref 8.7–10.3)
CHLORIDE: 105 mmol/L (ref 96–106)
CO2: 23 mmol/L (ref 20–29)
Creatinine, Ser: 0.94 mg/dL (ref 0.57–1.00)
GFR calc non Af Amer: 63 mL/min/{1.73_m2} (ref >59)
GFR, EST AFRICAN AMERICAN: 73 mL/min/{1.73_m2} (ref >59)
GLUCOSE: 99 mg/dL (ref 65–99)
POTASSIUM: 3.4 mmol/L — AB (ref 3.5–5.2)
Sodium: 146 mmol/L — ABNORMAL HIGH (ref 134–144)

## 2017-04-18 LAB — HEPATIC FUNCTION PANEL
ALT: 19 IU/L (ref 0–32)
AST: 14 IU/L (ref 0–40)
Albumin: 4.4 g/dL (ref 3.6–4.8)
Alkaline Phosphatase: 110 IU/L (ref 39–117)
BILIRUBIN, DIRECT: 0.06 mg/dL (ref 0.00–0.40)
TOTAL PROTEIN: 7.5 g/dL (ref 6.0–8.5)

## 2017-04-18 LAB — LIPASE: Lipase: 27 U/L (ref 14–72)

## 2017-04-18 LAB — CBC
Hematocrit: 39.3 % (ref 34.0–46.6)
Hemoglobin: 13.2 g/dL (ref 11.1–15.9)
MCH: 27.4 pg (ref 26.6–33.0)
MCHC: 33.6 g/dL (ref 31.5–35.7)
MCV: 82 fL (ref 79–97)
PLATELETS: 351 10*3/uL (ref 150–379)
RBC: 4.82 x10E6/uL (ref 3.77–5.28)
RDW: 16.9 % — AB (ref 12.3–15.4)
WBC: 5.8 10*3/uL (ref 3.4–10.8)

## 2017-04-26 DIAGNOSIS — R0789 Other chest pain: Secondary | ICD-10-CM | POA: Diagnosis not present

## 2017-04-26 DIAGNOSIS — I10 Essential (primary) hypertension: Secondary | ICD-10-CM | POA: Diagnosis not present

## 2017-04-30 DIAGNOSIS — I10 Essential (primary) hypertension: Secondary | ICD-10-CM | POA: Diagnosis not present

## 2017-04-30 DIAGNOSIS — R0789 Other chest pain: Secondary | ICD-10-CM | POA: Diagnosis not present

## 2017-05-10 DIAGNOSIS — R69 Illness, unspecified: Secondary | ICD-10-CM | POA: Diagnosis not present

## 2017-05-10 DIAGNOSIS — I10 Essential (primary) hypertension: Secondary | ICD-10-CM | POA: Diagnosis not present

## 2017-05-10 DIAGNOSIS — E7800 Pure hypercholesterolemia, unspecified: Secondary | ICD-10-CM | POA: Diagnosis not present

## 2017-05-10 DIAGNOSIS — R0789 Other chest pain: Secondary | ICD-10-CM | POA: Diagnosis not present

## 2017-06-03 DIAGNOSIS — I10 Essential (primary) hypertension: Secondary | ICD-10-CM | POA: Diagnosis not present

## 2017-06-03 DIAGNOSIS — E7800 Pure hypercholesterolemia, unspecified: Secondary | ICD-10-CM | POA: Diagnosis not present

## 2017-06-17 ENCOUNTER — Other Ambulatory Visit: Payer: Self-pay | Admitting: Family Medicine

## 2017-06-17 DIAGNOSIS — I10 Essential (primary) hypertension: Secondary | ICD-10-CM | POA: Diagnosis not present

## 2017-06-17 DIAGNOSIS — E7800 Pure hypercholesterolemia, unspecified: Secondary | ICD-10-CM | POA: Diagnosis not present

## 2017-06-17 DIAGNOSIS — R739 Hyperglycemia, unspecified: Secondary | ICD-10-CM | POA: Diagnosis not present

## 2017-06-17 DIAGNOSIS — Z1231 Encounter for screening mammogram for malignant neoplasm of breast: Secondary | ICD-10-CM

## 2017-06-17 DIAGNOSIS — R69 Illness, unspecified: Secondary | ICD-10-CM | POA: Diagnosis not present

## 2017-07-15 ENCOUNTER — Telehealth: Payer: Self-pay | Admitting: Family Medicine

## 2017-07-15 DIAGNOSIS — M1611 Unilateral primary osteoarthritis, right hip: Secondary | ICD-10-CM | POA: Insufficient documentation

## 2017-07-15 DIAGNOSIS — I7300 Raynaud's syndrome without gangrene: Secondary | ICD-10-CM | POA: Diagnosis not present

## 2017-07-15 DIAGNOSIS — M357 Hypermobility syndrome: Secondary | ICD-10-CM | POA: Diagnosis not present

## 2017-07-15 DIAGNOSIS — M25361 Other instability, right knee: Secondary | ICD-10-CM | POA: Diagnosis not present

## 2017-07-15 DIAGNOSIS — M25362 Other instability, left knee: Secondary | ICD-10-CM | POA: Diagnosis not present

## 2017-07-15 DIAGNOSIS — E7800 Pure hypercholesterolemia, unspecified: Secondary | ICD-10-CM

## 2017-07-15 NOTE — Telephone Encounter (Signed)
Copied from CRM [id number]. Topic: Quick Communication - Rx Refill/Question >> Jul 15, 2017 10:13 AM Alexander Bergeron B wrote: Medication: pravastatin (PRAVACHOL) 40 MG tablet [[id number]]  Pt called to inform pcp that the medication above is making her joints hurt and needs to switch to an alternate medication, call pt to advise

## 2017-07-16 MED ORDER — ROSUVASTATIN CALCIUM 5 MG PO TABS: 5.0000 mg | ORAL_TABLET | Freq: Every day | ORAL | 0 refills | Status: DC

## 2017-07-16 NOTE — Telephone Encounter (Signed)
Call --- have patient stop Pravastatin and start Rosuvastatin  every night.  I sent rx to [facility].

## 2017-07-16 NOTE — Telephone Encounter (Signed)
Phone call to patient. Advised of message from Dr. Katrinka Blazing. She states, "I already have tried that. It makes it worse. If you would have looked in my chart, you would have seen that Dr. Jacinto Halim prescribed this for me in the past".   Provider, please advise.

## 2017-07-23 MED ORDER — LOVASTATIN 20 MG PO TABS: 20.0000 mg | ORAL_TABLET | Freq: Every day | ORAL | 1 refills | Status: DC

## 2017-07-23 NOTE — Telephone Encounter (Signed)
Pt. Calling again about what she needs to do.   Please call

## 2017-07-23 NOTE — Telephone Encounter (Signed)
Call --- I did not recall that Dr. Jacinto Halim tried patient on Crestor.  This was my mistake.  I did not review her entire chart to determine what she had taken in the past.  If she has not tried Lovastatin in the past, I recommend a trial of it.  I have sent it to [facility].

## 2017-07-24 ENCOUNTER — Ambulatory Visit
Admission: RE | Admit: 2017-07-24 | Discharge: 2017-07-24 | Disposition: A | Discharge status: Home / Self Care | Payer: Medicare HMO | Source: Ambulatory Visit | Attending: Family Medicine | Admitting: Family Medicine

## 2017-07-24 DIAGNOSIS — Z1231 Encounter for screening mammogram for malignant neoplasm of breast: Secondary | ICD-10-CM

## 2017-07-24 DIAGNOSIS — Z780 Asymptomatic menopausal state: Secondary | ICD-10-CM | POA: Diagnosis not present

## 2017-07-24 DIAGNOSIS — E2839 Other primary ovarian failure: Secondary | ICD-10-CM | POA: Insufficient documentation

## 2017-07-26 DIAGNOSIS — M25361 Other instability, right knee: Secondary | ICD-10-CM | POA: Diagnosis not present

## 2017-07-26 DIAGNOSIS — M25562 Pain in left knee: Secondary | ICD-10-CM | POA: Diagnosis not present

## 2017-07-26 DIAGNOSIS — M25561 Pain in right knee: Secondary | ICD-10-CM | POA: Diagnosis not present

## 2017-07-26 DIAGNOSIS — M6281 Muscle weakness (generalized): Secondary | ICD-10-CM | POA: Diagnosis not present

## 2017-07-26 DIAGNOSIS — M25362 Other instability, left knee: Secondary | ICD-10-CM | POA: Diagnosis not present

## 2017-07-30 ENCOUNTER — Encounter: Payer: Self-pay | Admitting: Family Medicine

## 2017-07-31 DIAGNOSIS — I10 Essential (primary) hypertension: Secondary | ICD-10-CM | POA: Diagnosis not present

## 2017-07-31 DIAGNOSIS — M1990 Unspecified osteoarthritis, unspecified site: Secondary | ICD-10-CM | POA: Diagnosis not present

## 2017-07-31 DIAGNOSIS — R69 Illness, unspecified: Secondary | ICD-10-CM | POA: Diagnosis not present

## 2017-07-31 DIAGNOSIS — Z809 Family history of malignant neoplasm, unspecified: Secondary | ICD-10-CM | POA: Diagnosis not present

## 2017-07-31 DIAGNOSIS — Z888 Allergy status to other drugs, medicaments and biological substances status: Secondary | ICD-10-CM | POA: Diagnosis not present

## 2017-07-31 DIAGNOSIS — Z7982 Long term (current) use of aspirin: Secondary | ICD-10-CM | POA: Diagnosis not present

## 2017-07-31 DIAGNOSIS — Z825 Family history of asthma and other chronic lower respiratory diseases: Secondary | ICD-10-CM | POA: Diagnosis not present

## 2017-07-31 DIAGNOSIS — G8929 Other chronic pain: Secondary | ICD-10-CM | POA: Diagnosis not present

## 2017-07-31 DIAGNOSIS — Z8249 Family history of ischemic heart disease and other diseases of the circulatory system: Secondary | ICD-10-CM | POA: Diagnosis not present

## 2017-07-31 DIAGNOSIS — E785 Hyperlipidemia, unspecified: Secondary | ICD-10-CM | POA: Diagnosis not present

## 2017-08-19 ENCOUNTER — Ambulatory Visit: Payer: Medicare HMO | Admitting: Family Medicine
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
## 2017-09-02 ENCOUNTER — Other Ambulatory Visit: Payer: Self-pay

## 2017-09-02 ENCOUNTER — Ambulatory Visit (INDEPENDENT_AMBULATORY_CARE_PROVIDER_SITE_OTHER): Payer: Medicare HMO | Admitting: Family Medicine

## 2017-09-02 ENCOUNTER — Encounter: Payer: Self-pay | Admitting: Family Medicine

## 2017-09-02 VITALS — BP 108/68 | HR 97 | Temp 99.3°F | Resp 18 | Ht 66.14 in | Wt 178.6 lb

## 2017-09-02 DIAGNOSIS — F17200 Nicotine dependence, unspecified, uncomplicated: Secondary | ICD-10-CM | POA: Diagnosis not present

## 2017-09-02 DIAGNOSIS — I878 Other specified disorders of veins: Secondary | ICD-10-CM

## 2017-09-02 DIAGNOSIS — E559 Vitamin D deficiency, unspecified: Secondary | ICD-10-CM

## 2017-09-02 DIAGNOSIS — R7302 Impaired glucose tolerance (oral): Secondary | ICD-10-CM | POA: Diagnosis not present

## 2017-09-02 DIAGNOSIS — I10 Essential (primary) hypertension: Secondary | ICD-10-CM

## 2017-09-02 DIAGNOSIS — F419 Anxiety disorder, unspecified: Secondary | ICD-10-CM | POA: Diagnosis not present

## 2017-09-02 DIAGNOSIS — F329 Major depressive disorder, single episode, unspecified: Secondary | ICD-10-CM | POA: Diagnosis not present

## 2017-09-02 DIAGNOSIS — F32A Depression, unspecified: Secondary | ICD-10-CM

## 2017-09-02 DIAGNOSIS — E7800 Pure hypercholesterolemia, unspecified: Secondary | ICD-10-CM | POA: Diagnosis not present

## 2017-09-02 DIAGNOSIS — R69 Illness, unspecified: Secondary | ICD-10-CM | POA: Diagnosis not present

## 2017-09-02 DIAGNOSIS — R739 Hyperglycemia, unspecified: Secondary | ICD-10-CM | POA: Diagnosis not present

## 2017-09-02 LAB — POCT URINALYSIS DIP (MANUAL ENTRY)
Bilirubin, UA: NEGATIVE
GLUCOSE UA: NEGATIVE mg/dL
Nitrite, UA: NEGATIVE
PROTEIN UA: NEGATIVE mg/dL
RBC UA: NEGATIVE
SPEC GRAV UA: 1.015 (ref 1.010–1.025)
UROBILINOGEN UA: 0.2 U/dL
pH, UA: 5.5 (ref 5.0–8.0)

## 2017-09-02 MED ORDER — SPIRONOLACTONE 25 MG PO TABS: 25.0000 mg | ORAL_TABLET | Freq: Once | ORAL | 1 refills | Status: DC

## 2017-09-02 MED ORDER — LOVASTATIN 20 MG PO TABS: 20.0000 mg | ORAL_TABLET | Freq: Every day | ORAL | 1 refills | Status: DC

## 2017-09-02 MED ORDER — FUROSEMIDE 40 MG PO TABS: 40.0000 mg | ORAL_TABLET | Freq: Every day | ORAL | 1 refills | Status: DC

## 2017-09-02 MED ORDER — METOPROLOL SUCCINATE ER 100 MG PO TB24: ORAL_TABLET | ORAL | 1 refills | Status: DC

## 2017-09-02 MED ORDER — AMLODIPINE BESYLATE 10 MG PO TABS
10.0000 mg | ORAL_TABLET | Freq: Every day | ORAL | 1 refills | Status: DC
Start: 2017-09-02 — End: 2018-03-17

## 2017-09-02 NOTE — Patient Instructions (Signed)
     IF you received an x-ray today, you will receive an invoice from [facility]. Please contact [facility] at [phone] with questions or concerns regarding your invoice.   IF you received labwork today, you will receive an invoice from [facility]. Please contact [facility] at [phone] with questions or concerns regarding your invoice.   Our billing staff will not be able to assist you with questions regarding bills from these companies.  You will be contacted with the lab results as soon as they are available. The fastest way to get your results is to activate your My Chart account. Instructions are located on the last page of this paperwork. If you have not heard from us regarding the results in 2 weeks, please contact this office.     

## 2017-09-03 LAB — COMPREHENSIVE METABOLIC PANEL
ALBUMIN: 4.6 g/dL (ref 3.6–4.8)
ALT: 20 IU/L (ref 0–32)
AST: 18 IU/L (ref 0–40)
Albumin/Globulin Ratio: 1.7 (ref 1.2–2.2)
Alkaline Phosphatase: 90 IU/L (ref 39–117)
BUN / CREAT RATIO: 22 (ref 12–28)
BUN: 27 mg/dL (ref 8–27)
Bilirubin Total: <0.2 mg/dL (ref 0.0–1.2)
CALCIUM: 10 mg/dL (ref 8.7–10.3)
CHLORIDE: 105 mmol/L (ref 96–106)
CO2: 22 mmol/L (ref 20–29)
Creatinine, Ser: 1.23 mg/dL — ABNORMAL HIGH (ref 0.57–1.00)
GFR calc Af Amer: 52 mL/min/{1.73_m2} — ABNORMAL LOW (ref >59)
GFR calc non Af Amer: 45 mL/min/{1.73_m2} — ABNORMAL LOW (ref >59)
GLUCOSE: 105 mg/dL — AB (ref 65–99)
Globulin, Total: 2.7 g/dL (ref 1.5–4.5)
POTASSIUM: 5.1 mmol/L (ref 3.5–5.2)
SODIUM: 140 mmol/L (ref 134–144)
TOTAL PROTEIN: 7.3 g/dL (ref 6.0–8.5)

## 2017-09-03 LAB — CBC WITH DIFFERENTIAL/PLATELET
BASOS: 1 %
Basophils Absolute: 0.1 10*3/uL (ref 0.0–0.2)
EOS (ABSOLUTE): 0.1 10*3/uL (ref 0.0–0.4)
EOS: 2 %
Hematocrit: 40.2 % (ref 34.0–46.6)
Hemoglobin: 13.3 g/dL (ref 11.1–15.9)
IMMATURE GRANS (ABS): 0 10*3/uL (ref 0.0–0.1)
IMMATURE GRANULOCYTES: 0 %
LYMPHS: 30 %
Lymphocytes Absolute: 1.6 10*3/uL (ref 0.7–3.1)
MCH: 27.8 pg (ref 26.6–33.0)
MCHC: 33.1 g/dL (ref 31.5–35.7)
MCV: 84 fL (ref 79–97)
Monocytes Absolute: 0.5 10*3/uL (ref 0.1–0.9)
Monocytes: 10 %
NEUTROS PCT: 57 %
Neutrophils Absolute: 3 10*3/uL (ref 1.4–7.0)
PLATELETS: 385 10*3/uL (ref 150–450)
RBC: 4.79 x10E6/uL (ref 3.77–5.28)
RDW: 17.1 % — ABNORMAL HIGH (ref 12.3–15.4)
WBC: 5.3 10*3/uL (ref 3.4–10.8)

## 2017-09-03 LAB — LIPID PANEL
CHOLESTEROL TOTAL: 280 mg/dL — AB (ref 100–199)
Chol/HDL Ratio: 3.9 ratio (ref 0.0–4.4)
HDL: 72 mg/dL (ref >39)
LDL CALC: 182 mg/dL — AB (ref 0–99)
TRIGLYCERIDES: 128 mg/dL (ref 0–149)
VLDL CHOLESTEROL CAL: 26 mg/dL (ref 5–40)

## 2017-10-07 ENCOUNTER — Telehealth: Payer: Self-pay | Admitting: Family Medicine

## 2017-10-07 NOTE — Telephone Encounter (Signed)
Pt returned call and lab message given to her with verbal understanding.  She will try to increase her water intake. She rarely uses anti-inflammatory medications. She is trying to use the lovastatin but after taking if for a few days she starts to feel like she is crippled. She would like to try something different and requesting a call back to discuss what statins that she can try.  No result note found.

## 2017-10-08 NOTE — Telephone Encounter (Signed)
Please advise about a different statin medication she can try.

## 2017-10-11 NOTE — Addendum Note (Signed)
Addended by: Collie SiadSTALLINGS, Mellonie Guess A on: 10/11/2017 03:33 PM   Modules accepted: Orders

## 2017-10-25 ENCOUNTER — Telehealth: Payer: Self-pay | Admitting: Family Medicine

## 2017-10-25 NOTE — Telephone Encounter (Signed)
Called and spoke with pt to reschedule their appt. I advised of time, building number and late policy.  °

## 2017-10-28 ENCOUNTER — Other Ambulatory Visit: Payer: Self-pay

## 2017-10-28 ENCOUNTER — Ambulatory Visit: Payer: Self-pay | Admitting: *Deleted

## 2017-10-28 ENCOUNTER — Telehealth: Payer: Self-pay | Admitting: Family Medicine

## 2017-10-28 ENCOUNTER — Encounter: Payer: Self-pay | Admitting: Physician Assistant

## 2017-10-28 ENCOUNTER — Ambulatory Visit (INDEPENDENT_AMBULATORY_CARE_PROVIDER_SITE_OTHER): Payer: Medicare HMO | Admitting: Physician Assistant

## 2017-10-28 ENCOUNTER — Ambulatory Visit (INDEPENDENT_AMBULATORY_CARE_PROVIDER_SITE_OTHER): Payer: Medicare HMO

## 2017-10-28 VITALS — BP 115/75 | HR 90 | Temp 98.4°F | Resp 18 | Ht 65.28 in | Wt 182.6 lb

## 2017-10-28 DIAGNOSIS — R0602 Shortness of breath: Secondary | ICD-10-CM

## 2017-10-28 DIAGNOSIS — I10 Essential (primary) hypertension: Secondary | ICD-10-CM

## 2017-10-28 DIAGNOSIS — T7840XA Allergy, unspecified, initial encounter: Secondary | ICD-10-CM | POA: Diagnosis not present

## 2017-10-28 DIAGNOSIS — R42 Dizziness and giddiness: Secondary | ICD-10-CM | POA: Diagnosis not present

## 2017-10-28 LAB — POCT URINALYSIS DIP (MANUAL ENTRY)
BILIRUBIN UA: NEGATIVE
GLUCOSE UA: NEGATIVE mg/dL
Ketones, POC UA: NEGATIVE mg/dL
NITRITE UA: NEGATIVE
PH UA: 6.5 (ref 5.0–8.0)
Protein Ur, POC: NEGATIVE mg/dL
RBC UA: NEGATIVE
Spec Grav, UA: 1.015 (ref 1.010–1.025)
UROBILINOGEN UA: 0.2 U/dL

## 2017-10-28 LAB — POCT CBC
Granulocyte percent: 59.1 %G (ref 37–80)
HEMATOCRIT: 44.6 % (ref 37.7–47.9)
HEMOGLOBIN: 13.4 g/dL (ref 12.2–16.2)
LYMPH, POC: 2 (ref 0.6–3.4)
MCH, POC: 25.6 pg — AB (ref 27–31.2)
MCHC: 30 g/dL — AB (ref 31.8–35.4)
MCV: 85.3 fL (ref 80–97)
MID (cbc): 0.6 (ref 0–0.9)
MPV: 7.8 fL (ref 0–99.8)
POC GRANULOCYTE: 3.7 (ref 2–6.9)
POC LYMPH %: 32 % (ref 10–50)
POC MID %: 8.9 % (ref 0–12)
Platelet Count, POC: 368 10*3/uL (ref 142–424)
RBC: 5.23 M/uL (ref 4.04–5.48)
RDW, POC: 15 %
WBC: 6.3 10*3/uL (ref 4.6–10.2)

## 2017-10-28 LAB — GLUCOSE, POCT (MANUAL RESULT ENTRY): POC GLUCOSE: 95 mg/dL (ref 70–99)

## 2017-10-28 MED ORDER — METHYLPREDNISOLONE SODIUM SUCC 125 MG IJ SOLR: 62.5000 mg | Freq: Once | INTRAMUSCULAR | 0 refills | Status: DC

## 2017-10-28 MED ORDER — METHYLPREDNISOLONE SODIUM SUCC 125 MG IJ SOLR
60.0000 mg | Freq: Once | INTRAMUSCULAR | Status: AC
  Administered 2017-10-28: 60 mg via INTRAMUSCULAR

## 2017-10-28 MED ORDER — PREDNISONE 10 MG PO TABS: ORAL_TABLET | ORAL | 0 refills | Status: DC

## 2017-10-28 MED ORDER — DIPHENHYDRAMINE HCL 50 MG/ML IJ SOLN
50.0000 mg | Freq: Once | INTRAMUSCULAR | Status: AC
  Administered 2017-10-28: 50 mg via INTRAMUSCULAR

## 2017-10-28 NOTE — Telephone Encounter (Signed)
Copied from CRM [phone]. Topic: Quick Communication - See Telephone Encounter >> Oct 28, 2017  6:19 PM Jens SomMedley, Jennifer A wrote: CRM for notification. See Telephone encounter for: 10/28/17. [facility] [phone] called at 10/28/17 6:19pm wanting clarification on scribts that were both sent by Dr Danton ClapBritany Wiseman.  Methylprednisolne was sent on 10/28/17 at 3;34p and predniSONE (DELTASONE) 10 MG tablet [[id number]][[id number]] was sent 10/28/17 at 4:26pm.  He wants to know which one to fill.  Please advise

## 2017-10-28 NOTE — Progress Notes (Signed)
NANA VASTINE  MRN: [id number] DOB: 1950/06/23  Subjective:  FATOU DUNNIGAN is a 67 y.o. female seen in office today for a chief complaint of sudden onset rash involving the skin around the lips.  Rash started 3 days ago. Lesions were hive like in nature. She took benedryl and sx resolved. Started having some lower lip swelling sensation today. Also reports feeling SOB, palpitations, and lightheadedness.  She is pretty sure it is a lovastatin reaction.  She has had allergies to statins in the past started lovastatin a few months ago.  Was taking it every few days to avoid it " building up in my system."  However, has taken it daily for the past 5 days.  Started noticing this reaction on the fifth day of taking it consecutively.Patient denies: Trouble swallowing, throat swelling, eye swelling, tongue swelling, visual disturbance, chest pain, abdominal pain, arthralgia, decrease in appetite, decrease in energy level, fever, headache, irritability, myalgia, nausea, sore throat and vomiting. Patient has not had contacts with similar rash. Patient has not had new exposures (soaps, lotions, laundry detergents, foods, plants, insects or animals).  Smokes cigarettes occasionally. Has change half a cup of water today and coffee today. No recent surgery/travel/immobilization, hx of cancer, leg swelling, hemoptysis, prior DVT/PE, or hormone use. Last took benedryl 68m earlier today before noon.   Review of Systems  Per HPI  Patient Active Problem List   Diagnosis Date Noted  . Anxiety and depression 10/25/2015  . Glucose intolerance (impaired glucose tolerance) 07/28/2014  . Degeneration of cervical intervertebral disc 05/15/2013  . Cervical nerve root impingement 05/15/2013  . Venous stasis 08/04/2012  . Essential hypertension, benign 02/04/2012  . Pure hypercholesterolemia 02/04/2012  . Vitamin D deficiency 02/04/2012  . TOBACCO ABUSE 05/06/2007    Current Outpatient Medications on File Prior to  Visit  Medication Sig Dispense Refill  . amLODipine (NORVASC) 10 MG tablet Take 1 tablet (10 mg total) by mouth daily. 90 tablet 1  . aspirin EC 81 MG EC tablet Take 1 tablet (81 mg total) by mouth daily. 30 tablet 0  . chlorthalidone (HYGROTON) 25 MG tablet TAKE 1 TABLET BY MOUTH ONCE DAILY IN THE MORNING  2  . Cholecalciferol (VITAMIN D) 2000 UNITS CAPS Take 2,000 Units by mouth daily.    . furosemide (LASIX) 40 MG tablet Take 1 tablet (40 mg total) by mouth daily. 90 tablet 1  . lovastatin (MEVACOR) 20 MG tablet Take 1 tablet (20 mg total) by mouth at bedtime. 90 tablet 1  . meloxicam (MOBIC) 7.5 MG tablet Take by mouth.    . metoprolol succinate (TOPROL-XL) 100 MG 24 hr tablet TAKE ONE TABLET BY MOUTH ONCE DAILY WITH OR IMMEDIATELY FOLLOWING A MEAL. 90 tablet 1  . spironolactone (ALDACTONE) 25 MG tablet Take 1 tablet (25 mg total) by mouth once for 1 dose. 90 tablet 1   No current facility-administered medications on file prior to visit.     Allergies  Allergen Reactions  . Crestor [Rosuvastatin Calcium] Other (See Comments)    myalgias  . Doxycycline Other (See Comments)    Doesn't remember   . Lipitor [Atorvastatin]     Myalgias/feet pain  . Lisinopril Swelling    Tongue  . Naproxen Other (See Comments)    Doesn't remember   . Penicillins Other (See Comments)    Doesn't remember   . Pravastatin Other (See Comments)    Myalgias.  . Prednisone Other (See Comments)    Toes numb  .  Clindamycin/Lincomycin Rash     Objective:  BP 115/75   Pulse 90   Temp 98.4 F (36.9 C) (Oral)   Resp 18   Ht 5' 5.28" (1.658 m)   Wt 182 lb 9.6 oz (82.8 kg)   SpO2 95%   BMI 30.13 kg/m   Physical Exam  Constitutional: She is oriented to person, place, and time. She appears well-developed and well-nourished. She does not have a sickly appearance. She does not appear ill. No distress.  Sitting comfortably on exam table.  HENT:  Head: Normocephalic and atraumatic.  Right Ear: Tympanic  membrane, external ear and ear canal normal.  Left Ear: Tympanic membrane, external ear and ear canal normal.  Nose: No mucosal edema.  Mouth/Throat: Uvula is midline, oropharynx is clear and moist and mucous membranes are normal. No trismus in the jaw. No uvula swelling. No posterior oropharyngeal edema. No tonsillar exudate.  No periorbital swelling noted. No lip swelling noted.  Eyes: Pupils are equal, round, and reactive to light. Conjunctivae and EOM are normal.  Neck: Normal range of motion and full passive range of motion without pain. No Brudzinski's sign and no Kernig's sign noted.  Cardiovascular: Normal rate, regular rhythm, normal heart sounds and intact distal pulses.  Pulmonary/Chest: Effort normal and breath sounds normal. No accessory muscle usage or stridor. No tachypnea. No respiratory distress. She has no decreased breath sounds. She has no wheezes. She has no rhonchi. She has no rales.  Musculoskeletal:       Right lower leg: She exhibits no edema.       Left lower leg: She exhibits no edema.  Neurological: She is alert and oriented to person, place, and time. She has normal strength and normal reflexes. No cranial nerve deficit or sensory deficit. She displays a negative Romberg sign. Gait normal.  Normal FNF and HTS test.  Normal Tandem walk.  Negative Dix-Hallpike.  Skin: Skin is warm and dry.  One ~75m dry scaly papule noted on chin. No surrounding erythema, warmth, or purulent drainage noted.   Psychiatric: She has a normal mood and affect.  Vitals reviewed.    Results for orders placed or performed in visit on 10/28/17 (from the past 24 hour(s))  POCT CBC     Status: Abnormal   Collection Time: 10/28/17  4:05 PM  Result Value Ref Range   WBC 6.3 4.6 - 10.2 K/uL   Lymph, poc 2.0 0.6 - 3.4   POC LYMPH PERCENT 32.0 10 - 50 %L   MID (cbc) 0.6 0 - 0.9   POC MID % 8.9 0 - 12 %M   POC Granulocyte 3.7 2 - 6.9   Granulocyte percent 59.1 37 - 80 %G   RBC 5.23 4.04 -  5.48 M/uL   Hemoglobin 13.4 12.2 - 16.2 g/dL   HCT, POC 44.6 37.7 - 47.9 %   MCV 85.3 80 - 97 fL   MCH, POC 25.6 (A) 27 - 31.2 pg   MCHC 30.0 (A) 31.8 - 35.4 g/dL   RDW, POC 15.0 %   Platelet Count, POC 368 142 - 424 K/uL   MPV 7.8 0 - 99.8 fL  POCT glucose (manual entry)     Status: None   Collection Time: 10/28/17  4:12 PM  Result Value Ref Range   POC Glucose 95 70 - 99 mg/dl   Dg Chest 2 View  Result Date: 10/28/2017 CLINICAL DATA:  Shortness of breath. EXAM: CHEST - 2 VIEW COMPARISON:  Radiographs of August 27, 2013. FINDINGS: The heart size and mediastinal contours are within normal limits. Both lungs are clear. No pneumothorax or pleural effusion is noted. The visualized skeletal structures are unremarkable. IMPRESSION: No active cardiopulmonary disease. Electronically Signed   By: Marijo Conception, M.D.   On: 10/28/2017 16:25     EKG shows NSR with rate of 72 bpm. PR and QRS intervals within normal limits. No acute changes noted from prior EKG of 04/17/17.    Assessment and Plan :  1. Allergic reaction, initial encounter This is precepted with Dr. Nolon Rod.  Patient is overall well-appearing, no acute distress. She is sitting comfortably on the exam table laughing frequently with individual accompanying her.   Vitals stable. No apparent hives or swelling noted.  Normal physical exam.  CBC and glucose normal.  EKG normal.  Chest x-ray normal.  Will treat as allergic reaction due to reported symptoms with IM Benadryl and prednisone. Do not suspect anaphylaxis as pt has not any new exposures and has been taking lovastatin for months. Do not see any signs consistent with angioedema today although pt does state her lower lip feels a little swollen, this was not appreciated on physical exam. Patient has reported allergy to prednisone of making her feet feel numb when she is taking it. Denies hives, facial or throat swelling, wheezing, light-headedness, vomiting or shock reaction to prednisone.  Given Rx for pred taper at home. Rec also taking it daily Zyrtec and Zantac along with Benadryl at nighttime.  Avoid use of lovastatin.  Contact Dr. Irven Shelling office to discuss other treatments besides statins for cholesterol management.  Given strict ED precautions. - diphenhydrAMINE (BENADRYL) injection 50 mg - methylPREDNISolone sodium succinate (SOLU-MEDROL) 125 mg/2 mL injection 60 mg - predniSONE (DELTASONE) 10 MG tablet; 6-5-4-3-2-1 taper. Take all the tablets for that day in the am with food.  Dispense: 21 tablet; Refill: 0 2. Lightheadedness - POCT CBC - POCT glucose (manual entry) - POCT urinalysis dipstick - CMP14+EGFR - TSH - EKG 12-Lead 3. Shortness of breath - DG Chest 2 View; Future  Side effects, risks, benefits, and alternatives of the medications and treatment plan prescribed today were discussed, and patient expressed understanding of the instructions given. No barriers to understanding were identified. Red flags discussed in detail. Pt expressed understanding regarding what to do in case of emergency/urgent symptoms.   Tenna Delaine PA-C  [facility] 10/28/2017 4:28 PM

## 2017-10-28 NOTE — Telephone Encounter (Signed)
Pt reports rash "Around mouth", onset 2 days ago. Rash is raised and red. States "It's probably from the lovastatin."  States she usually takes for 2 days, then stops for a few days "Because it makes my legs itch." States she has recently taken the medication for the past 5 days consecutively.  Reports legs are itching as well; no rash present. Denies any swelling of lips, throat, no dysphagia. Reports increased fatigue lately and 1 episode of "Rapid heart beat" yesterday, duration 45 mins. Appt made for today with B. Barnett AbuWiseman. Care advise given per protocol.   At end of call pt. Stated, "I do feel short of breath a little." Speech unfaltering. Pt stated she did not feel ED necessary. Reiterated need to go to ED/911 for increased SOB, swelling of throat, tongue, lips,dysphagia. Pt verbalizes understanding.  Reason for Disposition . Hives or itching  Answer Assessment - Initial Assessment Questions 1. APPEARANCE of RASH: "Describe the rash." (e.g., spots, blisters, raised areas, skin peeling, scaly)     Papules around mouth 2. SIZE: "How big are the spots?" (e.g., tip of pen, eraser, coin; inches, centimeters)    Small 3. LOCATION: "Where is the rash located?"     Around mouth 4. COLOR: "What color is the rash?" (Note: It is difficult to assess rash color in people with darker-colored skin. When this situation occurs, simply ask the caller to describe what they see.)     Bright red 5. ONSET: "When did the rash begin?"     2 days ago 6. FEVER: "Do you have a fever?" If so, ask: "What is your temperature, how was it measured, and when did it start?"     no 7. ITCHING: "Does the rash itch?" If so, ask: "How bad is the itch?" (Scale 1-10; or mild, moderate, severe)     Itchy, moderate 8. CAUSE: "What do you think is causing the rash?"     Lovastatin 9. NEW MEDICATION: "What new medication are you taking?" (e.g., name of antibiotic) "When did you start taking this medication?".     No new, taken for  5 days consecutively, not usual dosage 10. OTHER SYMPTOMS: "Do you have any other symptoms?" (e.g., sore throat, fever, joint pain)      Increased fatigue  Protocols used: RASH - WIDESPREAD ON DRUGS-A-AH

## 2017-10-28 NOTE — Patient Instructions (Addendum)
This was concerning for an allergic reaction. Your labs, EKG, and CXR were all normal today.   Discontinue lovastatin at this time. Start daily zyrtec and zantac for the next 5 days. Take benedryl at night. Complete prednisone taper. Start tomorrow.  Prednisone is a steroid and can cause side effects such as headache, irritability, nausea, vomiting, increased heart rate, increased blood pressure, increased blood sugar, appetite changes, and insomnia. Please take tablets in the morning with a full meal to help decrease the chances of these side effects.   Contact cardiology to discuss other options for elevated cholesterol. Please let Dr. Creta LevinStallings know if you have not heard from them within 2 weeks.   If any of your symptoms return or you develop new concerning sx, please seek care immediately. Thank you for letting me participate in your health and well being.    If you have lab work done today you will be contacted with your lab results within the next 2 weeks.  If you have not heard from us then please contact us. The fastest way to get your results is to register for My Chart.   IF you received an x-ray today, you will receive an invoice from [facility]. Please contact [facility] at [phone] with questions or concerns regarding your invoice.   IF you received labwork today, you will receive an invoice from [facility]. Please contact [facility] at [phone] with questions or concerns regarding your invoice.   Our billing staff will not be able to assist you with questions regarding bills from these companies.  You will be contacted with the lab results as soon as they are available. The fastest way to get your results is to activate your My Chart account. Instructions are located on the last page of this paperwork. If you have not heard from us regarding the results in 2 weeks, please contact this office.

## 2017-10-29 LAB — CMP14+EGFR
ALBUMIN: 4.7 g/dL (ref 3.6–4.8)
ALK PHOS: 98 IU/L (ref 39–117)
ALT: 22 IU/L (ref 0–32)
AST: 19 IU/L (ref 0–40)
Albumin/Globulin Ratio: 1.5 (ref 1.2–2.2)
BUN / CREAT RATIO: 23 (ref 12–28)
BUN: 39 mg/dL — AB (ref 8–27)
Bilirubin Total: <0.2 mg/dL (ref 0.0–1.2)
CALCIUM: 10.7 mg/dL — AB (ref 8.7–10.3)
CO2: 23 mmol/L (ref 20–29)
Chloride: 98 mmol/L (ref 96–106)
Creatinine, Ser: 1.68 mg/dL — ABNORMAL HIGH (ref 0.57–1.00)
GFR calc Af Amer: 36 mL/min/{1.73_m2} — ABNORMAL LOW (ref >59)
GFR, EST NON AFRICAN AMERICAN: 31 mL/min/{1.73_m2} — AB (ref >59)
GLOBULIN, TOTAL: 3.2 g/dL (ref 1.5–4.5)
GLUCOSE: 121 mg/dL — AB (ref 65–99)
Potassium: 4.1 mmol/L (ref 3.5–5.2)
SODIUM: 141 mmol/L (ref 134–144)
TOTAL PROTEIN: 7.9 g/dL (ref 6.0–8.5)

## 2017-10-29 LAB — TSH: TSH: 1.44 u[IU]/mL (ref 0.450–4.500)

## 2017-10-29 NOTE — Telephone Encounter (Signed)
Pt calling states prednisone tablets were called into pharmacy which she got.  She was given a shot in office, but there is also a shot at the pharmacy.  Pt wants to know what of these is she supposed to do/take.

## 2017-10-30 NOTE — Telephone Encounter (Signed)
I talked with pt and stated that she is not to pick up the shot but only the pill prednisone. Pt stated understanding.I will also call pharmacy.

## 2017-10-30 NOTE — Telephone Encounter (Signed)
I talked with Judy Carlson at the pharmacy and stated to canceled the order for the shot. She states understanding

## 2017-10-30 NOTE — Telephone Encounter (Signed)
Please call both pt and pharmacy. She needs the prednisone tablets, not the shot. Please ask pharmacy to remove the shot as a Rx. Please let me know if there is any further questions. Thanks!

## 2017-10-31 ENCOUNTER — Encounter: Payer: Self-pay | Admitting: Family Medicine

## 2017-10-31 ENCOUNTER — Other Ambulatory Visit: Payer: Self-pay

## 2017-10-31 ENCOUNTER — Ambulatory Visit (INDEPENDENT_AMBULATORY_CARE_PROVIDER_SITE_OTHER): Payer: Medicare HMO | Admitting: Family Medicine

## 2017-10-31 VITALS — BP 108/71 | HR 57 | Temp 97.5°F | Resp 16 | Ht 65.28 in | Wt 184.4 lb

## 2017-10-31 DIAGNOSIS — I9589 Other hypotension: Secondary | ICD-10-CM | POA: Diagnosis not present

## 2017-10-31 DIAGNOSIS — T7840XD Allergy, unspecified, subsequent encounter: Secondary | ICD-10-CM | POA: Diagnosis not present

## 2017-10-31 DIAGNOSIS — R110 Nausea: Secondary | ICD-10-CM | POA: Diagnosis not present

## 2017-10-31 DIAGNOSIS — R42 Dizziness and giddiness: Secondary | ICD-10-CM

## 2017-10-31 MED ORDER — ONDANSETRON HCL 4 MG PO TABS: 4.0000 mg | ORAL_TABLET | Freq: Three times a day (TID) | ORAL | 0 refills | Status: DC | PRN

## 2017-10-31 NOTE — Patient Instructions (Signed)
Stay off the blood pressure medications until your blood pressure is increased to greater than 130/80  Hold off on taking the aspirin until you get allergy testings Stop the statin Stop the prednisone  Continue metoprolol only  Take ondansetron for nausea as needed  Continue benadryl every 8-12 hours for allergy
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
## 2017-12-12 ENCOUNTER — Other Ambulatory Visit (INDEPENDENT_AMBULATORY_CARE_PROVIDER_SITE_OTHER): Payer: Medicare HMO

## 2017-12-12 ENCOUNTER — Encounter: Payer: Self-pay | Admitting: Internal Medicine

## 2017-12-12 ENCOUNTER — Ambulatory Visit (INDEPENDENT_AMBULATORY_CARE_PROVIDER_SITE_OTHER): Payer: Medicare HMO | Admitting: Internal Medicine

## 2017-12-12 DIAGNOSIS — F419 Anxiety disorder, unspecified: Secondary | ICD-10-CM | POA: Diagnosis not present

## 2017-12-12 DIAGNOSIS — I10 Essential (primary) hypertension: Secondary | ICD-10-CM

## 2017-12-12 DIAGNOSIS — E785 Hyperlipidemia, unspecified: Secondary | ICD-10-CM

## 2017-12-12 DIAGNOSIS — F32A Depression, unspecified: Secondary | ICD-10-CM

## 2017-12-12 DIAGNOSIS — R739 Hyperglycemia, unspecified: Secondary | ICD-10-CM

## 2017-12-12 DIAGNOSIS — N183 Chronic kidney disease, stage 3 unspecified: Secondary | ICD-10-CM | POA: Insufficient documentation

## 2017-12-12 DIAGNOSIS — R69 Illness, unspecified: Secondary | ICD-10-CM | POA: Diagnosis not present

## 2017-12-12 DIAGNOSIS — R7302 Impaired glucose tolerance (oral): Secondary | ICD-10-CM | POA: Diagnosis not present

## 2017-12-12 DIAGNOSIS — J449 Chronic obstructive pulmonary disease, unspecified: Secondary | ICD-10-CM | POA: Diagnosis not present

## 2017-12-12 DIAGNOSIS — E559 Vitamin D deficiency, unspecified: Secondary | ICD-10-CM

## 2017-12-12 DIAGNOSIS — F329 Major depressive disorder, single episode, unspecified: Secondary | ICD-10-CM

## 2017-12-12 LAB — BASIC METABOLIC PANEL
BUN: 11 mg/dL (ref 6–23)
CO2: 25 mEq/L (ref 19–32)
CREATININE: 1 mg/dL (ref 0.40–1.20)
Calcium: 9.7 mg/dL (ref 8.4–10.5)
Chloride: 107 mEq/L (ref 96–112)
GFR: 71.03 mL/min (ref >60.00)
Glucose, Bld: 107 mg/dL — ABNORMAL HIGH (ref 70–99)
POTASSIUM: 3.7 meq/L (ref 3.5–5.1)
Sodium: 141 mEq/L (ref 135–145)

## 2017-12-12 LAB — HEPATIC FUNCTION PANEL
ALK PHOS: 76 U/L (ref 39–117)
ALT: 18 U/L (ref 0–35)
AST: 16 U/L (ref 0–37)
Albumin: 4.2 g/dL (ref 3.5–5.2)
BILIRUBIN DIRECT: 0.1 mg/dL (ref 0.0–0.3)
Total Bilirubin: 0.4 mg/dL (ref 0.2–1.2)
Total Protein: 7.4 g/dL (ref 6.0–8.3)

## 2017-12-12 LAB — LIPID PANEL
CHOL/HDL RATIO: 5
CHOLESTEROL: 275 mg/dL — AB (ref 0–200)
HDL: 60.3 mg/dL (ref >39.00)
LDL Cholesterol: 192 mg/dL — ABNORMAL HIGH (ref 0–99)
NonHDL: 214.84
TRIGLYCERIDES: 114 mg/dL (ref 0.0–149.0)
VLDL: 22.8 mg/dL (ref 0.0–40.0)

## 2017-12-12 LAB — HEMOGLOBIN A1C: Hgb A1c MFr Bld: 5.9 % (ref 4.6–6.5)

## 2017-12-12 MED ORDER — FUROSEMIDE 40 MG PO TABS: 40.0000 mg | ORAL_TABLET | Freq: Every day | ORAL | 1 refills | Status: DC | PRN

## 2017-12-12 NOTE — Assessment & Plan Note (Signed)
Will watch 

## 2017-12-12 NOTE — Assessment & Plan Note (Signed)
Amlodipine, Metoprolol 

## 2017-12-12 NOTE — Progress Notes (Signed)
Subjective:  Patient ID: Judy Carlson, female    DOB: Apr 13, 1950  Age: 67 y.o. MRN: [id number]  CC: No chief complaint on file.   HPI Judy Carlson presents for a new pt visit C/o previous reaction to Lovastatin weeks ago - rash; seems to resolve F/u HTN - meds were adjusted, abn labs, COPD  Outpatient Medications Prior to Visit  Medication Sig Dispense Refill  . amLODipine (NORVASC) 10 MG tablet Take 1 tablet (10 mg total) by mouth daily. 90 tablet 1  . Cholecalciferol (VITAMIN D) 2000 UNITS CAPS Take 2,000 Units by mouth daily.    . furosemide (LASIX) 40 MG tablet Take 1 tablet (40 mg total) by mouth daily. 90 tablet 1  . meloxicam (MOBIC) 7.5 MG tablet Take by mouth.    . metoprolol succinate (TOPROL-XL) 100 MG 24 hr tablet TAKE ONE TABLET BY MOUTH ONCE DAILY WITH OR IMMEDIATELY FOLLOWING A MEAL. 90 tablet 1  . aspirin EC 81 MG EC tablet Take 1 tablet (81 mg total) by mouth daily. 30 tablet 0  . chlorthalidone (HYGROTON) 25 MG tablet TAKE 1 TABLET BY MOUTH ONCE DAILY IN THE MORNING  2  . lovastatin (MEVACOR) 20 MG tablet Take 1 tablet (20 mg total) by mouth at bedtime. 90 tablet 1  . ondansetron (ZOFRAN) 4 MG tablet Take 1 tablet (4 mg total) by mouth every 8 (eight) hours as needed for nausea or vomiting. 20 tablet 0  . spironolactone (ALDACTONE) 25 MG tablet Take 1 tablet (25 mg total) by mouth once for 1 dose. 90 tablet 1   No facility-administered medications prior to visit.     ROS: Review of Systems  Constitutional: Negative for activity change, appetite change, chills, fatigue and unexpected weight change.  HENT: Negative for congestion, mouth sores and sinus pressure.   Eyes: Negative for visual disturbance.  Respiratory: Negative for cough and chest tightness.   Gastrointestinal: Negative for abdominal pain and nausea.  Genitourinary: Negative for difficulty urinating, frequency and vaginal pain.  Musculoskeletal: Negative for back pain and gait problem.  Skin:  Negative for pallor and rash.  Neurological: Negative for dizziness, tremors, weakness, numbness and headaches.  Psychiatric/Behavioral: Negative for confusion and sleep disturbance.    Objective:  BP 138/76 (BP Location: Left Arm, Patient Position: Sitting, Cuff Size: Normal)   Pulse (!) 33   Temp 97.8 F (36.6 C) (Oral)   Ht 5' 5.28" (1.658 m)   Wt 188 lb (85.3 kg)   SpO2 98%   BMI 31.02 kg/m   BP Readings from Last 3 Encounters:  12/12/17 138/76  10/31/17 108/71  10/28/17 115/75    Wt Readings from Last 3 Encounters:  12/12/17 188 lb (85.3 kg)  10/31/17 184 lb 6.4 oz (83.6 kg)  10/28/17 182 lb 9.6 oz (82.8 kg)    Physical Exam  Constitutional: She appears well-developed. No distress.  HENT:  Head: Normocephalic.  Right Ear: External ear normal.  Left Ear: External ear normal.  Nose: Nose normal.  Mouth/Throat: Oropharynx is clear and moist.  Eyes: Pupils are equal, round, and reactive to light. Conjunctivae are normal. Right eye exhibits no discharge. Left eye exhibits no discharge.  Neck: Normal range of motion. Neck supple. No JVD present. No tracheal deviation present. No thyromegaly present.  Cardiovascular: Normal rate, regular rhythm and normal heart sounds.  Pulmonary/Chest: No stridor. No respiratory distress. She has no wheezes.  Abdominal: Soft. Bowel sounds are normal. She exhibits no distension and no mass. There is  no tenderness. There is no rebound and no guarding.  Musculoskeletal: She exhibits no edema or tenderness.  Lymphadenopathy:    She has no cervical adenopathy.  Neurological: She displays normal reflexes. No cranial nerve deficit. She exhibits normal muscle tone. Coordination normal.  Skin: No rash noted. No erythema.  Psychiatric: She has a normal mood and affect. Her behavior is normal. Judgment and thought content normal.    Lab Results  Component Value Date   WBC 6.3 10/28/2017   HGB 13.4 10/28/2017   HCT 44.6 10/28/2017   PLT 385  09/02/2017   GLUCOSE 121 (H) 10/28/2017   CHOL 280 (H) 09/02/2017   TRIG 128 09/02/2017   HDL 72 09/02/2017   LDLDIRECT 132.9 04/30/2007   LDLCALC 182 (H) 09/02/2017   ALT 22 10/28/2017   AST 19 10/28/2017   NA 141 10/28/2017   K 4.1 10/28/2017   CL 98 10/28/2017   CREATININE 1.68 (H) 10/28/2017   BUN 39 (H) 10/28/2017   CO2 23 10/28/2017   TSH 1.440 10/28/2017   INR 0.97 05/14/2013   HGBA1C 5.7 (H) 02/13/2017    Dg Bone Density  Result Date: 07/24/2017 EXAM: DUAL X-RAY ABSORPTIOMETRY (DXA) FOR BONE MINERAL DENSITY IMPRESSION: Dear Dr. Ethelda Chick, Your patient Judy Carlson completed a BMD test on 07/24/2017 using the Lunar iDXA DXA System (analysis version: 14.10) manufactured by [employer]. The following summarizes the results of our evaluation. PATIENT BIOGRAPHICAL: Name: Judy Carlson Patient ID: [id number] Birth Date: [birth year] Height: 64.0 in. Gender: Female Exam Date: 07/24/2017 Weight: 180.7 lbs. Indications: Early Menopause, Height Loss, Hysterectomy, Oophorectomy Bilateral, Osteoarthritis, Postmenopausal, Rheumatoid Arthritis, Surgical Induced Menopause, Tobacco User(Current Smoker) Fractures: Treatments: Vitamin D ASSESSMENT: The BMD measured at Femur Neck Right is 1.080 g/cm2 with a T-score of 0.3. This patient is considered normal according to World Health Organization [facility]) criteria. Site Region Measured Measured WHO Young Adult BMD Date       Age      Classification T-score AP Spine L1-L4 07/24/[age].0 Normal 0.7 1.276 g/cm2 DualFemur Neck Right 07/24/2017 67.0 Normal 0.3 1.080 g/cm2 World Health Organization [facility]) criteria for post-menopausal, Caucasian Women: Normal:       T-score at or above -1 SD Osteopenia:   T-score between -1 and -2.5 SD Osteoporosis: T-score at or below -2.5 SD RECOMMENDATIONS: 1. All patients should optimize calcium and vitamin D intake. 2. Consider FDA-approved medical therapies in postmenopausal women and men aged 86 years and older, based  on the following: a. A hip or vertebral(clinical or morphometric) fracture b. T-score < -2.5 at the femoral neck or spine after appropriate evaluation to exclude secondary causes c. Low bone mass (T-score between -1.0 and -2.5 at the femoral neck or spine) and a 10-year probability of a hip fracture > 3% or a 10-year probability of a major osteoporosis-related fracture > 20% based on the [place]-adapted WHO algorithm d. Clinician judgment and/or patient preferences may indicate treatment for people with 10-year fracture probabilities above or below these levels FOLLOW-UP: People with diagnosed cases of osteoporosis or at high risk for fracture should have regular bone mineral density tests. For patients eligible for Medicare, routine testing is allowed once every 2 years. The testing frequency can be increased to one year for patients who have rapidly progressing disease, those who are receiving or discontinuing medical therapy to restore bone mass, or have additional risk factors. I have reviewed this report, and agree with the above findings. [facility] Electronically Signed   By:  Bretta Bang III M.D.   On: 07/24/2017 13:56   Mm Digital Screening Bilateral  Result Date: 07/24/2017 CLINICAL DATA:  Screening. EXAM: DIGITAL SCREENING BILATERAL MAMMOGRAM WITH CAD COMPARISON:  Previous exam(s). ACR Breast Density Category b: There are scattered areas of fibroglandular density. FINDINGS: There are no findings suspicious for malignancy. Images were processed with CAD. IMPRESSION: No mammographic evidence of malignancy. A result letter of this screening mammogram will be mailed directly to the patient. RECOMMENDATION: Screening mammogram in one year. (Code:SM-B-01Y) BI-RADS CATEGORY  1: Negative. Electronically Signed   By: Frederico Hamman M.D.   On: 07/24/2017 15:22    Assessment & Plan:   There are no diagnoses linked to this encounter.   No orders of the defined types were placed in this  encounter.    Follow-up: No follow-ups on file.  Sonda Primes, MD

## 2017-12-12 NOTE — Patient Instructions (Addendum)
Cardiac CT calcium scoring test $150   Computed tomography, more commonly known as a CT or CAT scan, is a diagnostic medical imaging test. Like traditional x-rays, it produces multiple images or pictures of the inside of the body. The cross-sectional images generated during a CT scan can be reformatted in multiple planes. They can even generate three-dimensional images. These images can be viewed on a computer monitor, printed on film or by a 3D printer, or transferred to a CD or DVD. CT images of internal organs, bones, soft tissue and blood vessels provide greater detail than traditional x-rays, particularly of soft tissues and blood vessels. A cardiac CT scan for coronary calcium is a non-invasive way of obtaining information about the presence, location and extent of calcified plaque in the coronary arteries-the vessels that supply oxygen-containing blood to the heart muscle. Calcified plaque results when there is a build-up of fat and other substances under the inner layer of the artery. This material can calcify which signals the presence of atherosclerosis, a disease of the vessel wall, also called coronary artery disease (CAD). People with this disease have an increased risk for heart attacks. In addition, over time, progression of plaque build up (CAD) can narrow the arteries or even close off blood flow to the heart. The result may be chest pain, sometimes called "angina," or a heart attack. Because calcium is a marker of CAD, the amount of calcium detected on a cardiac CT scan is a helpful prognostic tool. The findings on cardiac CT are expressed as a calcium score. Another name for this test is coronary artery calcium scoring.  What are some common uses of the procedure? The goal of cardiac CT scan for calcium scoring is to determine if CAD is present and to what extent, even if there are no symptoms. It is a screening study that may be recommended by a physician for patients with risk factors  for CAD but no clinical symptoms. The major risk factors for CAD are: . high blood cholesterol levels  . family history of heart attacks  . diabetes  . high blood pressure  . cigarette smoking  . overweight or obese  . physical inactivity   A negative cardiac CT scan for calcium scoring shows no calcification within the coronary arteries. This suggests that CAD is absent or so minimal it cannot be seen by this technique. The chance of having a heart attack over the next two to five years is very low under these circumstances. A positive test means that CAD is present, regardless of whether or not the patient is experiencing any symptoms. The amount of calcification-expressed as the calcium score-may help to predict the likelihood of a myocardial infarction (heart attack) in the coming years and helps your medical doctor or cardiologist decide whether the patient may need to take preventive medicine or undertake other measures such as diet and exercise to lower the risk for heart attack. The extent of CAD is graded according to your calcium score:  Calcium Score  Presence of CAD  0 No evidence of CAD   1-10 Minimal evidence of CAD  11-100 Mild evidence of CAD  101-400 Moderate evidence of CAD  Over 400 Extensive evidence of CAD   [facility] well w/Jill 

## 2017-12-12 NOTE — Assessment & Plan Note (Signed)
Labs

## 2017-12-12 NOTE — Assessment & Plan Note (Signed)
Quit smoking in 2019

## 2017-12-12 NOTE — Assessment & Plan Note (Signed)
Labs Diet 

## 2017-12-12 NOTE — Assessment & Plan Note (Addendum)
Intolerant/allergic to: Atorvastatin, Rosuvastatin, Pravastatin.  CT Ca scoring suggested

## 2017-12-12 NOTE — Assessment & Plan Note (Signed)
Discussed.

## 2017-12-16 ENCOUNTER — Encounter: Payer: Self-pay | Admitting: Allergy

## 2017-12-16 ENCOUNTER — Ambulatory Visit: Payer: Medicare HMO | Admitting: Allergy

## 2017-12-16 VITALS — BP 118/82 | HR 77 | Temp 97.9°F | Resp 18 | Ht 64.0 in | Wt 187.8 lb

## 2017-12-16 DIAGNOSIS — R12 Heartburn: Secondary | ICD-10-CM | POA: Diagnosis not present

## 2017-12-16 DIAGNOSIS — J449 Chronic obstructive pulmonary disease, unspecified: Secondary | ICD-10-CM | POA: Diagnosis not present

## 2017-12-16 DIAGNOSIS — T50905D Adverse effect of unspecified drugs, medicaments and biological substances, subsequent encounter: Secondary | ICD-10-CM

## 2017-12-16 DIAGNOSIS — T50905A Adverse effect of unspecified drugs, medicaments and biological substances, initial encounter: Secondary | ICD-10-CM | POA: Insufficient documentation

## 2017-12-16 NOTE — Assessment & Plan Note (Addendum)
Myalgias with multiple statins and questionable IgE mediated reaction to lovastatin.   Unfortunately there is no good skin testing or bloodwork available for this but based on clinical history recommend avoiding statins and using another class of lipid lowering agent if necessary. Multiple other allergies listed. Patient is not sure of specific reactions or timeline to the antibiotics. Lisinopril caused angioedema in the past.   Continue to avoid drugs on allergy list especially lisinopril.  Schedule for penicillin skin testing and in office drug challenge in future. No antihistamines for 5-7 days before.

## 2017-12-16 NOTE — Assessment & Plan Note (Signed)
40+ year history of smoking. Quit in 2019.  Today's spirometry was normal. Monitor symptoms.
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
## 2017-12-16 NOTE — Patient Instructions (Addendum)
Drug reaction Myalgias with multiple statins and questionable IgE mediated reaction to lovastatin.   Unfortunately there is no good skin testing or bloodwork available for this but based on clinical history recommend avoiding statins and using another class of lipid lowering agent if necessary. Multiple other allergies listed. Patient is not sure of specific reactions or timeline to the antibiotics. Lisinopril caused angioedema in the past.   Continue to avoid drugs on allergy list especially lisinopril.  Schedule for penicillin skin testing and in office drug challenge in future. No antihistamines for 5-7 days before.  COPD (chronic obstructive pulmonary disease) (HCC) 40+ year history of smoking. Quit in 2019.  Today's spirometry was normal. Monitor symptoms.  Heartburn Chest pain which improves sometimes with zantac.  Discussed lifestyle modifications and handout given.  May take pepcid as needed.  Return for penicillin skin testing and challenge.  Follow up with PCP regarding sleep study.  What are the symptoms of acid reflux? The symptoms include:  ?Burning in the chest, known as heartburn  ?Burning in the throat or an acid taste in the throat  ?Stomach or chest pain  ?Trouble swallowing  ?Having a raspy voice or a sore throat  ?Unexplained cough  Is there anything I can do on my own to improve my symptoms? Yes. You might feel better if you:  ?Lose weight (if you are overweight)  ?Raise the head of your bed by 6 to 8 inches (for example, by putting blocks of wood or rubber under 2 legs of the bed or a Styrofoam wedge under the mattress)  ?Avoid foods that make your symptoms worse (examples include coffee, chocolate, alcohol, peppermint, and fatty foods)  ?Cut down on the amount of alcohol you drink  ?Stop smoking, if you smoke  ?Avoid lying down for 3 hours after a meal  What treatments can help with my acid reflux? There are a few main types of medicines  that can help with the symptoms of acid reflux: antacids, surface acting agents, histamine blockers, and proton pump inhibitors (table 1). All of these medicines work by reducing or blocking stomach acid. But they each do that in a different way.  Antacids and surface acting agents can relieve mild symptoms, but they work only for a short time. Histamine blockers are stronger and last longer than antacids and surface acting agents. You can buy antacids and most histamine blockers without a prescription.  Proton pump inhibitors are the most effective medicines in treating GERD. Some of these medicines are sold without a prescription. But there are other versions that your doctor or nurse can prescribe.  Sometimes acid reflux medicines are less expensive if you get them with a prescription. Other times nonprescription medicines are less expensive. If cost is a concern for you, ask your pharmacist how you might reduce the cost of your medicines.

## 2017-12-16 NOTE — Assessment & Plan Note (Signed)
Chest pain which improves sometimes with zantac.  Discussed lifestyle modifications and handout given.  May take pepcid as needed.

## 2018-01-08 ENCOUNTER — Encounter: Payer: Self-pay | Admitting: Allergy

## 2018-01-08 ENCOUNTER — Ambulatory Visit: Payer: Medicare HMO | Admitting: Allergy

## 2018-01-08 VITALS — BP 120/80 | HR 76 | Resp 16

## 2018-01-08 DIAGNOSIS — T360X5D Adverse effect of penicillins, subsequent encounter: Secondary | ICD-10-CM | POA: Diagnosis not present

## 2018-01-08 DIAGNOSIS — T50905D Adverse effect of unspecified drugs, medicaments and biological substances, subsequent encounter: Secondary | ICD-10-CM | POA: Diagnosis not present

## 2018-01-08 NOTE — Progress Notes (Signed)
Follow Up Note  RE: Judy Carlson MRN: [id number] DOB: 1950-12-17 Date of Office Visit: 01/08/2018  Referring provider: Tresa Garter, MD Primary care provider: Tresa Garter, MD  Chief Complaint: Allergy Testing (Penicillin Challenge)   Assessment and Plan: Judy Carlson is a 67 y.o. female with: Drug reaction Past history - Myalgias with multiple statins and questionable IgE mediated reaction to lovastatin. Lisinopril caused angioedema in the past.  Interim history - No additional reactions.  Today's skin testing was negative to penicillin and pre pen.  Continue to avoid drugs on allergy list especially lisinopril and statins.   Return if symptoms worsen or fail to improve.  Plan: Challenge drug: penicillin Challenge as per protocol: Passed Total time: 85 minutes  For next 24 hours monitor for hives, swelling, shortness of breath and dizziness. If you see these symptoms, use Benadryl for mild symptoms and epinephrine for more severe symptoms and call 911.  If no adverse symptoms in the next 24 hours then will take off drug from allergy list. Patient's risk of developing penicillin allergy in the future is the same as the general population's.  History of Present Illness:   I had the pleasure of seeing Judy Carlson for a follow up visit at the [facility] on 01/08/2018. She is a 67 y.o. female, who is being followed for multiple adverse drug reactions. Today she is here for penicillin drug testing and challenge. Her previous allergy office visit was on 12/16/2017.   Chief Complaint: Challenge testing to penicillin.  History of Reaction: Reaction occurred around age 24 but not sure but did not have to go to the hospital for this.   Labs: None   Interval History: Patient has not been ill, she has not had any accidental exposures to the culprit medication.   Recent/Current History: Pulmonary disease: no Ex-smoker Cardiac disease:  no Respiratory infection: no Rash: no Itch: no Swelling: no Cough: no Shortness of breath: no Runny/stuffy nose: no Itchy eyes: no Beta-blocker use: yes  Patient/guardian was informed of the test procedure with verbalized understanding of the risk of anaphylaxis.   Last antihistamine use: none Last beta-blocker use: 01/07/2018 AM.  Medication List:  Current Outpatient Medications  Medication Sig Dispense Refill  . amLODipine (NORVASC) 10 MG tablet Take 1 tablet (10 mg total) by mouth daily. 90 tablet 1  . Cholecalciferol (VITAMIN D) 2000 UNITS CAPS Take 2,000 Units by mouth daily.    . furosemide (LASIX) 40 MG tablet Take 1 tablet (40 mg total) by mouth daily as needed. 90 tablet 1  . meloxicam (MOBIC) 7.5 MG tablet Take by mouth.    . metoprolol succinate (TOPROL-XL) 100 MG 24 hr tablet TAKE ONE TABLET BY MOUTH ONCE DAILY WITH OR IMMEDIATELY FOLLOWING A MEAL. 90 tablet 1   No current facility-administered medications for this visit.     Allergies: Allergies  Allergen Reactions  . Crestor [Rosuvastatin Calcium] Other (See Comments)    myalgias  . Doxycycline Other (See Comments)    Doesn't remember   . Lipitor [Atorvastatin]     Myalgias/feet pain  . Lisinopril Swelling    Tongue  . Lovastatin Hives    rash  . Naproxen Other (See Comments)    Doesn't remember   . Penicillins Other (See Comments)    Doesn't remember   . Pravastatin Other (See Comments)    Myalgias.  . Prednisone Itching, Swelling and Other (See Comments)    Toes numb, itching and swelling  on inside of mouth  . Clindamycin/Lincomycin Rash    I reviewed her past medical history, social history, family history, and environmental history and no significant changes have been reported from previous visit on 12/16/2017.  Review of Systems  Constitutional: Negative for appetite change, chills, fever and unexpected weight change.  HENT: Negative for congestion and rhinorrhea.   Eyes: Negative for  itching.  Respiratory: Negative for cough, chest tightness, shortness of breath and wheezing.   Gastrointestinal: Negative for abdominal pain.  Skin: Negative for rash.  Neurological: Negative for headaches.    Objective:  BP 120/80 (BP Location: Left Arm, Patient Position: Sitting, Cuff Size: Normal)   Pulse 76   Resp 16   SpO2 92%  There is no height or weight on file to calculate BMI. Physical Exam  Constitutional: She is oriented to person, place, and time. She appears well-developed and well-nourished.  HENT:  Head: Normocephalic and atraumatic.  Right Ear: External ear normal.  Left Ear: External ear normal.  Nose: Nose normal.  Mouth/Throat: Oropharynx is clear and moist.  Eyes: Conjunctivae and EOM are normal.  Neck: Neck supple.  Cardiovascular: Normal rate, regular rhythm and normal heart sounds. Exam reveals no gallop and no friction rub.  No murmur heard. Pulmonary/Chest: Effort normal and breath sounds normal. She has no wheezes. She has no rales.  Lymphadenopathy:    She has no cervical adenopathy.  Neurological: She is alert and oriented to person, place, and time.  Skin: Skin is warm. No rash noted.  Psychiatric: She has a normal mood and affect. Her behavior is normal.  Nursing note and vitals reviewed.  Previous notes and tests were reviewed.  Skin Testing: penicillin. Negative test to: pre-pen and penicillin.  Results discussed with patient/family. Oral Challenge - 01/08/18 1048    Challenge Food/Drug  Penicillin     Lot #  if Applicable  [id number]    Food/Drug provided by  Office    BP  118/82    Pulse  77    Respirations  18    Lungs  Clear    Skin  Clear    Mouth  Clear    Time  1029    Dose  .5mL    BP  116/78    Pulse  68    Respirations  16    Lungs  Clear    Skin  Clear    Mouth  Clear    Time  1057    Dose  4.93mL    BP  120/84    Pulse  69    Respirations  18    Lungs  Clear    Skin  Clear    Mouth  Clear     Penicillin -  01/08/18 0935    Manufacturer  [employer]    Lot #  [id number]    Location  Arm    Number of allergen test  8    Time Testing Placed  0906    Control SPT  Negative    Histamine SPT  3+    Pre-Pen Puncture  Negative    Penicillin-G 5000 u/ml SPT  Negative    Select  Select    Time Testing Placed  0930    Control Intradermal  Negative    Pre-Pen Intradermal  Negative    Penicillin-G 50 u/ml Intradermal  Negative    Time Testing Placed  0949    Penicillin-G 5000 u/ml Intradermal  Negative  The plan was reviewed with the patient/family, and all questions/concerned were addressed.  It was my pleasure to see Alanny today and participate in her care. Please feel free to contact me with any questions or concerns.  Sincerely,  Wyline Mood, DO Allergy & Immunology  [facility] office: [phone] [facility] office:[phone]

## 2018-01-08 NOTE — Assessment & Plan Note (Signed)
Past history - Myalgias with multiple statins and questionable IgE mediated reaction to lovastatin. Lisinopril caused angioedema in the past.  Interim history - No additional reactions.  Today's skin testing was negative to penicillin and pre pen.  Continue to avoid drugs on allergy list especially lisinopril and statins.

## 2018-01-08 NOTE — Patient Instructions (Addendum)
Drug reaction Past history - Myalgias with multiple statins and questionable IgE mediated reaction to lovastatin. Lisinopril caused angioedema in the past.  Interim history - No additional reactions.  Today's skin testing was negative to penicillin and pre pen.  Continue to avoid drugs on allergy list especially lisinopril and statins.   Return if symptoms worsen or fail to improve.  For next 24 hours monitor for hives, swelling, shortness of breath and dizziness. If you see these symptoms, use Benadryl for mild symptoms and epinephrine for more severe symptoms and call 911.  If no adverse symptoms in the next 24 hours then will take off drug from allergy list.

## 2018-01-09 ENCOUNTER — Encounter: Payer: Self-pay | Admitting: Allergy

## 2018-01-09 NOTE — Telephone Encounter (Signed)
-----   Message from Yoon M Kim, DO sent at 01/09/2018  4:42 PM EST ----- Regarding: follow up drug challenge Please call patient and see how she did after penicillin challenge. If no issues then I will remove from her allergy list.  Thank you. 

## 2018-01-09 NOTE — Telephone Encounter (Signed)
-----   Message from Ellamae Sia, DO sent at 01/09/2018  4:42 PM EST ----- Regarding: follow up drug challenge Please call patient and see how she did after penicillin challenge. If no issues then I will remove from her allergy list.  Thank you.

## 2018-01-10 ENCOUNTER — Telehealth: Payer: Self-pay

## 2018-01-10 NOTE — Telephone Encounter (Signed)
Patient is doing fine after challenge. No symptoms noted.

## 2018-03-10 ENCOUNTER — Ambulatory Visit: Payer: Medicare HMO | Admitting: Family Medicine

## 2018-03-17 ENCOUNTER — Ambulatory Visit (INDEPENDENT_AMBULATORY_CARE_PROVIDER_SITE_OTHER): Payer: Medicare HMO | Admitting: Internal Medicine

## 2018-03-17 ENCOUNTER — Encounter: Payer: Self-pay | Admitting: Internal Medicine

## 2018-03-17 VITALS — BP 122/76 | HR 64 | Temp 98.0°F | Ht 64.0 in | Wt 186.0 lb

## 2018-03-17 DIAGNOSIS — I10 Essential (primary) hypertension: Secondary | ICD-10-CM

## 2018-03-17 DIAGNOSIS — E785 Hyperlipidemia, unspecified: Secondary | ICD-10-CM | POA: Diagnosis not present

## 2018-03-17 DIAGNOSIS — T50905D Adverse effect of unspecified drugs, medicaments and biological substances, subsequent encounter: Secondary | ICD-10-CM

## 2018-03-17 DIAGNOSIS — R7302 Impaired glucose tolerance (oral): Secondary | ICD-10-CM | POA: Diagnosis not present

## 2018-03-17 DIAGNOSIS — Z23 Encounter for immunization: Secondary | ICD-10-CM

## 2018-03-17 MED ORDER — METOPROLOL SUCCINATE ER 100 MG PO TB24: ORAL_TABLET | ORAL | 3 refills | Status: DC

## 2018-03-17 MED ORDER — AMLODIPINE BESYLATE 10 MG PO TABS: 10.0000 mg | ORAL_TABLET | Freq: Every day | ORAL | 3 refills | Status: DC

## 2018-03-17 MED ORDER — FUROSEMIDE 40 MG PO TABS: 40.0000 mg | ORAL_TABLET | Freq: Every day | ORAL | 3 refills | Status: DC | PRN

## 2018-03-17 NOTE — Progress Notes (Signed)
Subjective:  Patient ID: Judy Carlson, female    DOB: May 07, 1950  Age: 68 y.o. MRN: [id number]  CC: No chief complaint on file.   HPI Judy Carlson presents for HTN, dyslipidemia, allergies Dr Elmyra RicksKim'Carlson notes reviewed  Outpatient Medications Prior to Visit  Medication Sig Dispense Refill  . Cholecalciferol (VITAMIN D) 2000 UNITS CAPS Take 2,000 Units by mouth daily.    . meloxicam (MOBIC) 7.5 MG tablet Take by mouth.    Marland Kitchen. amLODipine (NORVASC) 10 MG tablet Take 1 tablet (10 mg total) by mouth daily. 90 tablet 1  . furosemide (LASIX) 40 MG tablet Take 1 tablet (40 mg total) by mouth daily as needed. 90 tablet 1  . metoprolol succinate (TOPROL-XL) 100 MG 24 hr tablet TAKE ONE TABLET BY MOUTH ONCE DAILY WITH OR IMMEDIATELY FOLLOWING A MEAL. 90 tablet 1   No facility-administered medications prior to visit.     ROS: Review of Systems  Constitutional: Negative for activity change, appetite change, chills, fatigue and unexpected weight change.  HENT: Negative for congestion, mouth sores and sinus pressure.   Eyes: Negative for visual disturbance.  Respiratory: Negative for cough and chest tightness.   Gastrointestinal: Negative for abdominal pain and nausea.  Genitourinary: Negative for difficulty urinating, frequency and vaginal pain.  Musculoskeletal: Negative for back pain and gait problem.  Skin: Negative for pallor and rash.  Neurological: Negative for dizziness, tremors, weakness, numbness and headaches.  Psychiatric/Behavioral: Negative for confusion and sleep disturbance.    Objective:  BP 122/76 (BP Location: Left Arm, Patient Position: Sitting, Cuff Size: Large)   Pulse 64   Temp 98 F (36.7 C) (Oral)   Ht 5\' 4"  (1.626 m)   Wt 186 lb (84.4 kg)   SpO2 96%   BMI 31.93 kg/m   BP Readings from Last 3 Encounters:  03/17/18 122/76  01/08/18 120/80  12/16/17 118/82    Wt Readings from Last 3 Encounters:  03/17/18 186 lb (84.4 kg)  12/16/17 187 lb 12.8 oz (85.2 kg)   12/12/17 188 lb (85.3 kg)    Physical Exam Constitutional:      General: She is not in acute distress.    Appearance: She is well-developed.  HENT:     Head: Normocephalic.     Right Ear: External ear normal.     Left Ear: External ear normal.     Nose: Nose normal.  Eyes:     General:        Right eye: No discharge.        Left eye: No discharge.     Conjunctiva/sclera: Conjunctivae normal.     Pupils: Pupils are equal, round, and reactive to light.  Neck:     Musculoskeletal: Normal range of motion and neck supple.     Thyroid: No thyromegaly.     Vascular: No JVD.     Trachea: No tracheal deviation.  Cardiovascular:     Rate and Rhythm: Normal rate and regular rhythm.     Heart sounds: Normal heart sounds.  Pulmonary:     Effort: No respiratory distress.     Breath sounds: No stridor. No wheezing.  Abdominal:     General: Bowel sounds are normal. There is no distension.     Palpations: Abdomen is soft. There is no mass.     Tenderness: There is no abdominal tenderness. There is no guarding or rebound.  Musculoskeletal:        General: No tenderness.  Lymphadenopathy:  Cervical: No cervical adenopathy.  Skin:    Findings: No erythema or rash.  Neurological:     Cranial Nerves: No cranial nerve deficit.     Motor: No abnormal muscle tone.     Coordination: Coordination normal.     Deep Tendon Reflexes: Reflexes normal.  Psychiatric:        Behavior: Behavior normal.        Thought Content: Thought content normal.        Judgment: Judgment normal.     Lab Results  Component Value Date   WBC 6.3 10/28/2017   HGB 13.4 10/28/2017   HCT 44.6 10/28/2017   PLT 385 09/02/2017   GLUCOSE 107 (H) 12/12/2017   CHOL 275 (H) 12/12/2017   TRIG 114.0 12/12/2017   HDL 60.30 12/12/2017   LDLDIRECT 132.9 04/30/2007   LDLCALC 192 (H) 12/12/2017   ALT 18 12/12/2017   AST 16 12/12/2017   NA 141 12/12/2017   K 3.7 12/12/2017   CL 107 12/12/2017   CREATININE 1.00  12/12/2017   BUN 11 12/12/2017   CO2 25 12/12/2017   TSH 1.440 10/28/2017   INR 0.97 05/14/2013   HGBA1C 5.9 12/12/2017    Dg Bone Density  Result Date: 07/24/2017 EXAM: DUAL X-RAY ABSORPTIOMETRY (DXA) FOR BONE MINERAL DENSITY IMPRESSION: Dear Dr. [name], Your patient Judy Carlson completed a BMD test on 07/24/2017 using the Lunar iDXA DXA System (analysis version: 14.10) manufactured by [employer]. The following summarizes the results of our evaluation. PATIENT BIOGRAPHICAL: Name: Judy Carlson, Judy Carlson Patient ID: [id number] Birth Date: 04-15-1950 Height: 64.0 in. Gender: Female Exam Date: 07/24/2017 Weight: 180.7 lbs. Indications: Early Menopause, Height Loss, Hysterectomy, Oophorectomy Bilateral, Osteoarthritis, Postmenopausal, Rheumatoid Arthritis, Surgical Induced Menopause, Tobacco User(Current Smoker) Fractures: Treatments: Vitamin D ASSESSMENT: The BMD measured at Femur Neck Right is 1.080 g/cm2 with a T-score of 0.3. This patient is considered normal according to World Health Organization [facility](WHO) criteria. Site Region Measured Measured WHO Young Adult BMD Date       Age      Classification T-score AP Spine L1-L4 7/24[age].0 Normal 0.7 1.276 g/cm2 DualFemur Neck Right 07[age]24/[age].0 Normal 0.3 1.080 g/cm2 World Health Organization [facility](WHO) criteria for post-menopausal, Caucasian Women: Normal:       T-score at or above -1 SD Osteopenia:   T-score between -1 and -2.5 SD Osteoporosis: T-score at or below -2.5 SD RECOMMENDATIONS: 1. All patients should optimize calcium and vitamin D intake. 2. Consider FDA-approved medical therapies in postmenopausal women and men aged 68 years and older, based on the following: a. A hip or vertebral(clinical or morphometric) fracture b. T-score < -2.5 at the femoral neck or spine after appropriate evaluation to exclude secondary causes c. Low bone mass (T-score between -1.0 and -2.5 at the femoral neck or spine) and a 10-year probability of a hip fracture > 3%  or a 10-year probability of a major osteoporosis-related fracture > 20% based on the [place]-adapted WHO algorithm d. Clinician judgment and/or patient preferences may indicate treatment for people with 10-year fracture probabilities above or below these levels FOLLOW-UP: People with diagnosed cases of osteoporosis or at high risk for fracture should have regular bone mineral density tests. For patients eligible for Medicare, routine testing is allowed once every 2 years. The testing frequency can be increased to one year for patients who have rapidly progressing disease, those who are receiving or discontinuing medical therapy to restore bone mass, or have additional risk factors. I have reviewed this  report, and agree with the above findings. [facility] Electronically Signed   By: Bretta Bang III M.D.   On: 07/24/2017 13:56   Mm Digital Screening Bilateral  Result Date: 07/24/2017 CLINICAL DATA:  Screening. EXAM: DIGITAL SCREENING BILATERAL MAMMOGRAM WITH CAD COMPARISON:  Previous exam(Carlson). ACR Breast Density Category b: There are scattered areas of fibroglandular density. FINDINGS: There are no findings suspicious for malignancy. Images were processed with CAD. IMPRESSION: No mammographic evidence of malignancy. A result letter of this screening mammogram will be mailed directly to the patient. RECOMMENDATION: Screening mammogram in one year. (Code:SM-B-01Y) BI-RADS CATEGORY  1: Negative. Electronically Signed   By: Frederico Hamman M.D.   On: 07/24/2017 15:22    Assessment & Plan:   There are no diagnoses linked to this encounter.   Meds ordered this encounter  Medications  . furosemide (LASIX) 40 MG tablet    Sig: Take 1 tablet (40 mg total) by mouth daily as needed.    Dispense:  90 tablet    Refill:  3  . metoprolol succinate (TOPROL-XL) 100 MG 24 hr tablet    Sig: TAKE ONE TABLET BY MOUTH ONCE DAILY WITH OR IMMEDIATELY FOLLOWING A MEAL.    Dispense:  90 tablet    Refill:  3    . amLODipine (NORVASC) 10 MG tablet    Sig: Take 1 tablet (10 mg total) by mouth daily.    Dispense:  90 tablet    Refill:  3     Follow-up: No follow-ups on file.  Sonda Primes, MD

## 2018-03-17 NOTE — Assessment & Plan Note (Signed)
Labs Wt loss 

## 2018-03-17 NOTE — Assessment & Plan Note (Signed)
Amlodipine, Metoprolol,  Furosemide prn

## 2018-03-17 NOTE — Assessment & Plan Note (Signed)
PCN allergy was removed

## 2018-03-17 NOTE — Addendum Note (Signed)
Addended by: Scarlett PrestoFRIEDENBACH, Cecille Mcclusky on: 03/17/2018 09:05 AM   Modules accepted: Orders

## 2018-03-17 NOTE — Assessment & Plan Note (Addendum)
Statin intolerant Zetia option discussed

## 2018-03-20 ENCOUNTER — Ambulatory Visit: Payer: Medicare HMO | Admitting: Family Medicine

## 2018-04-21 DIAGNOSIS — Z961 Presence of intraocular lens: Secondary | ICD-10-CM | POA: Diagnosis not present

## 2018-04-21 DIAGNOSIS — H1045 Other chronic allergic conjunctivitis: Secondary | ICD-10-CM | POA: Diagnosis not present

## 2018-04-21 DIAGNOSIS — H2512 Age-related nuclear cataract, left eye: Secondary | ICD-10-CM | POA: Diagnosis not present

## 2018-05-14 ENCOUNTER — Encounter: Payer: Self-pay | Admitting: Internal Medicine

## 2018-05-14 ENCOUNTER — Other Ambulatory Visit: Payer: Self-pay

## 2018-05-14 ENCOUNTER — Ambulatory Visit (INDEPENDENT_AMBULATORY_CARE_PROVIDER_SITE_OTHER): Payer: Medicare HMO | Admitting: Internal Medicine

## 2018-05-14 VITALS — BP 118/70 | HR 92 | Temp 98.9°F | Resp 16 | Ht 64.0 in | Wt 188.0 lb

## 2018-05-14 DIAGNOSIS — L0293 Carbuncle, unspecified: Secondary | ICD-10-CM | POA: Diagnosis not present

## 2018-05-14 MED ORDER — SULFAMETHOXAZOLE-TRIMETHOPRIM 800-160 MG PO TABS: 1.0000 | ORAL_TABLET | Freq: Two times a day (BID) | ORAL | 0 refills | Status: DC
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
## 2018-05-14 NOTE — Patient Instructions (Signed)
Take the antibiotic as prescribed - complete the entire course.  Take over the counter pain medication if needed.   Apply warm compresses.

## 2018-05-14 NOTE — Assessment & Plan Note (Addendum)
Exam consistent with carbuncle Lesion is firm without fluctuance and cannot be drained Start Bactrim twice daily x10 days.  Discussed possible side effects.  She has had this in the past Apply warm compresses Over-the-counter pain medications as needed Call if no improvement or questions

## 2018-05-16 ENCOUNTER — Ambulatory Visit: Payer: Self-pay | Admitting: *Deleted

## 2018-05-16 NOTE — Telephone Encounter (Signed)
Pt called with complaints symptoms after starting bactrim on 05/14/2018; her dose was that evening, and was taken with two 16.9 oz bottles of water; the pt says that on evening of 05/15/2018 she had numbness from her elbows to finger tips, and both knees to toes; and nausea; she also said that developed a "terrible headache" that evening; on 05/16/2018 the numbness and tingling extended to her feet; she also started  developed itching in the areas that is affected by numbness and tingling from her head to the soles of her feet, numbness and tingling in her upper and lower extremities that feel like a rubber band around those limbs; recommendations made per nurse triage protocol; the pt verbalized understanding and says that she will have someone take her to the ED instead of calling EMS; will route to office for notification; [name]; last seen by Dr Lawerance Bach, Theressa Millard on 05/14/2018.  Reason for Disposition . [1] Weakness (i.e., paralysis, loss of muscle strength) of the face, arm / hand, or leg / foot on one side of the body AND [2] sudden onset AND [3] present now  Answer Assessment - Initial Assessment Questions 1. SYMPTOM: "What is the main symptom you are concerned about?" (e.g., weakness, numbness)     Numbness and tingling 2. ONSET: "When did this start?" (minutes, hours, days; while sleeping)     05/15/2018 3. LAST NORMAL: "When was the last time you were normal (no symptoms)?"     05/14/2018 4. PATTERN "Does this come and go, or has it been constant since it started?"  "Is it present now?"     Constant until she puts her hands over her head; when she puts them down her symptoms return 5. CARDIAC SYMPTOMS: "Have you had any of the following symptoms: chest pain, difficulty breathing, palpitations?"     no 6. NEUROLOGIC SYMPTOMS: "Have you had any of the following symptoms: headache, dizziness, vision loss, double vision, changes in speech, unsteady on your feet?"     Headache, symptoms occur  when pt walks but they go away from her hips and legs when she sits, coughing, nausea, itching  7. OTHER SYMPTOMS: "Do you have any other symptoms?"     no 8. PREGNANCY: "Is there any chance you are pregnant?" "When was your last menstrual period?"     no  Protocols used: NEUROLOGIC DEFICIT-A-AH

## 2018-07-07 DIAGNOSIS — M1990 Unspecified osteoarthritis, unspecified site: Secondary | ICD-10-CM | POA: Diagnosis not present

## 2018-07-07 DIAGNOSIS — G8929 Other chronic pain: Secondary | ICD-10-CM | POA: Diagnosis not present

## 2018-07-07 DIAGNOSIS — E669 Obesity, unspecified: Secondary | ICD-10-CM | POA: Diagnosis not present

## 2018-07-07 DIAGNOSIS — H04129 Dry eye syndrome of unspecified lacrimal gland: Secondary | ICD-10-CM | POA: Diagnosis not present

## 2018-07-07 DIAGNOSIS — Z87891 Personal history of nicotine dependence: Secondary | ICD-10-CM | POA: Diagnosis not present

## 2018-07-07 DIAGNOSIS — Z809 Family history of malignant neoplasm, unspecified: Secondary | ICD-10-CM | POA: Diagnosis not present

## 2018-07-07 DIAGNOSIS — Z8249 Family history of ischemic heart disease and other diseases of the circulatory system: Secondary | ICD-10-CM | POA: Diagnosis not present

## 2018-07-07 DIAGNOSIS — I10 Essential (primary) hypertension: Secondary | ICD-10-CM | POA: Diagnosis not present

## 2018-07-07 DIAGNOSIS — Z881 Allergy status to other antibiotic agents status: Secondary | ICD-10-CM | POA: Diagnosis not present

## 2018-07-07 DIAGNOSIS — R69 Illness, unspecified: Secondary | ICD-10-CM | POA: Diagnosis not present

## 2018-08-21 ENCOUNTER — Other Ambulatory Visit: Payer: Self-pay | Admitting: Internal Medicine

## 2018-08-21 DIAGNOSIS — Z1231 Encounter for screening mammogram for malignant neoplasm of breast: Secondary | ICD-10-CM

## 2018-09-16 ENCOUNTER — Other Ambulatory Visit: Payer: Self-pay | Admitting: Internal Medicine

## 2018-09-16 ENCOUNTER — Other Ambulatory Visit (INDEPENDENT_AMBULATORY_CARE_PROVIDER_SITE_OTHER): Payer: Medicare HMO

## 2018-09-16 ENCOUNTER — Other Ambulatory Visit: Payer: Self-pay

## 2018-09-16 ENCOUNTER — Ambulatory Visit (INDEPENDENT_AMBULATORY_CARE_PROVIDER_SITE_OTHER): Payer: Medicare HMO | Admitting: Internal Medicine

## 2018-09-16 ENCOUNTER — Encounter: Payer: Self-pay | Admitting: Internal Medicine

## 2018-09-16 VITALS — BP 122/78 | HR 80 | Temp 98.4°F | Ht 64.0 in | Wt 187.0 lb

## 2018-09-16 DIAGNOSIS — E876 Hypokalemia: Secondary | ICD-10-CM

## 2018-09-16 DIAGNOSIS — I10 Essential (primary) hypertension: Secondary | ICD-10-CM

## 2018-09-16 DIAGNOSIS — N183 Chronic kidney disease, stage 3 unspecified: Secondary | ICD-10-CM

## 2018-09-16 DIAGNOSIS — R195 Other fecal abnormalities: Secondary | ICD-10-CM | POA: Diagnosis not present

## 2018-09-16 DIAGNOSIS — J449 Chronic obstructive pulmonary disease, unspecified: Secondary | ICD-10-CM | POA: Diagnosis not present

## 2018-09-16 DIAGNOSIS — R7989 Other specified abnormal findings of blood chemistry: Secondary | ICD-10-CM

## 2018-09-16 DIAGNOSIS — D649 Anemia, unspecified: Secondary | ICD-10-CM | POA: Insufficient documentation

## 2018-09-16 DIAGNOSIS — R7302 Impaired glucose tolerance (oral): Secondary | ICD-10-CM | POA: Diagnosis not present

## 2018-09-16 DIAGNOSIS — D509 Iron deficiency anemia, unspecified: Secondary | ICD-10-CM

## 2018-09-16 DIAGNOSIS — R69 Illness, unspecified: Secondary | ICD-10-CM | POA: Diagnosis not present

## 2018-09-16 DIAGNOSIS — F17200 Nicotine dependence, unspecified, uncomplicated: Secondary | ICD-10-CM

## 2018-09-16 LAB — CBC WITH DIFFERENTIAL/PLATELET
Basophils Absolute: 0.1 10*3/uL (ref 0.0–0.1)
Basophils Relative: 1 % (ref 0.0–3.0)
Eosinophils Absolute: 0.1 10*3/uL (ref 0.0–0.7)
Eosinophils Relative: 2.1 % (ref 0.0–5.0)
HCT: 41.2 % (ref 36.0–46.0)
Hemoglobin: 13.6 g/dL (ref 12.0–15.0)
Lymphocytes Relative: 41.8 % (ref 12.0–46.0)
Lymphs Abs: 2.5 10*3/uL (ref 0.7–4.0)
MCHC: 33 g/dL (ref 30.0–36.0)
MCV: 84.8 fl (ref 78.0–100.0)
Monocytes Absolute: 0.7 10*3/uL (ref 0.1–1.0)
Monocytes Relative: 10.8 % (ref 3.0–12.0)
Neutro Abs: 2.7 10*3/uL (ref 1.4–7.7)
Neutrophils Relative %: 44.3 % (ref 43.0–77.0)
Platelets: 380 10*3/uL (ref 150.0–400.0)
RBC: 4.86 Mil/uL (ref 3.87–5.11)
RDW: 16.7 % — ABNORMAL HIGH (ref 11.5–15.5)
WBC: 6.1 10*3/uL (ref 4.0–10.5)

## 2018-09-16 LAB — HEPATIC FUNCTION PANEL
ALT: 18 U/L (ref 0–35)
AST: 15 U/L (ref 0–37)
Albumin: 4.4 g/dL (ref 3.5–5.2)
Alkaline Phosphatase: 78 U/L (ref 39–117)
Bilirubin, Direct: -0.1 mg/dL — ABNORMAL LOW (ref 0.0–0.3)
Total Bilirubin: 0.3 mg/dL (ref 0.2–1.2)
Total Protein: 7.6 g/dL (ref 6.0–8.3)

## 2018-09-16 LAB — BASIC METABOLIC PANEL
BUN: 19 mg/dL (ref 6–23)
CO2: 24 mEq/L (ref 19–32)
Calcium: 9.6 mg/dL (ref 8.4–10.5)
Chloride: 107 mEq/L (ref 96–112)
Creatinine, Ser: 1.05 mg/dL (ref 0.40–1.20)
GFR: 63.02 mL/min (ref >60.00)
Glucose, Bld: 100 mg/dL — ABNORMAL HIGH (ref 70–99)
Potassium: 3.5 mEq/L (ref 3.5–5.1)
Sodium: 141 mEq/L (ref 135–145)

## 2018-09-16 LAB — TSH: TSH: 5.91 u[IU]/mL — ABNORMAL HIGH (ref 0.35–4.50)

## 2018-09-16 LAB — HEMOGLOBIN A1C: Hgb A1c MFr Bld: 6 % (ref 4.6–6.5)

## 2018-09-16 NOTE — Assessment & Plan Note (Signed)
Amlodipine, Metoprolol,  Furosemide prn 5/20 KCl per [facility]

## 2018-09-16 NOTE — Assessment & Plan Note (Signed)
Stopped smoking May 2020 

## 2018-09-16 NOTE — Assessment & Plan Note (Signed)
C/o dark stools - starting May 2000. The pt has started iron po in May per her  [facility] order (Dr Hicks) for anemia. Had Colonoscopy in 2017 - Dr Danis Labs F/u w/Dr Hicks 

## 2018-09-16 NOTE — Assessment & Plan Note (Signed)
On KCl per [place]

## 2018-09-16 NOTE — Assessment & Plan Note (Signed)
C/o dark stools - starting May 2000. The pt has started iron po in May per her  [facility] order (Dr Ishmael Holter) for anemia. Had Colonoscopy in 2017 - Dr Meribeth Mattes F/u w/Dr Ishmael Holter

## 2018-09-16 NOTE — Assessment & Plan Note (Signed)
Labs

## 2018-09-16 NOTE — Patient Instructions (Addendum)
These suggestions will probably help you to lose weight: 1.  Reduce your consumption of sugars and starches.  Eliminate high fructose corn syrup from your diet.  Reduce your consumption of processed foods.  For desserts try to have seasonal fruits, berries with with green, knots, cheeses or dark chocolate with more than 70% cacao. 2.  Do not snack 3.  You do not have to eat breakfast.  If you choose to have breakfast-eat plain greek yogurt, eggs, oatmeal (without sugar) 4.  Drink water, freshly brewed unsweetened tea (green, black or herbal) or coffee.  Do not drink sodas including diet sodas , juices, beverages sweetened with artificial sweeteners. 5.  Reduce your consumption of refined grains. 6.  Avoid protein drinks such as Optifast, Slim fast etc. Eat chicken, fish, meat, dairy and beings for your sources of protein 7.  Natural unprocessed fats like cold pressed virgin olive oil, butter, coconut oil are good for you.  Eat avocados 8.  Increase your consumption of fiber.  Fruits, berries, vegetables, whole grains, flaxseeds, Chia seeds, beans, popcorn, nuts, oatmeal are good sources of fiber 9.  Use vinegar in your diet, i.e. apple cider vinegar, red wine or balsamic vinegar 10.  You can try fasting.  For example you can skip breakfast and lunch every other day (24-hour fast)    Mediterranean diet is good for you.  If you drink alcohol, limit your alcohol intake to no more than 2 drinks a day. The Mediterranean diet is a way of eating based on the traditional cuisine of countries bordering the [place]. While there is no single definition of the Mediterranean diet, it is typically high in vegetables, fruits, whole grains, beans, nut and seeds, and olive oil. The main components of Mediterranean diet include: Marland Kitchen. Daily consumption of vegetables, fruits, whole grains and healthy fats  . Weekly intake of fish, poultry, beans and eggs  . Moderate portions of dairy products  . Limited  intake of red meat Other important elements of the Mediterranean diet are sharing meals with family and friends, enjoying a glass of red wine and being physically active.     Cardiac CT calcium scoring test $150   Computed tomography, more commonly known as a CT or CAT scan, is a diagnostic medical imaging test. Like traditional x-rays, it produces multiple images or pictures of the inside of the body. The cross-sectional images generated during a CT scan can be reformatted in multiple planes. They can even generate three-dimensional images. These images can be viewed on a computer monitor, printed on film or by a 3D printer, or transferred to a CD or DVD. CT images of internal organs, bones, soft tissue and blood vessels provide greater detail than traditional x-rays, particularly of soft tissues and blood vessels. A cardiac CT scan for coronary calcium is a non-invasive way of obtaining information about the presence, location and extent of calcified plaque in the coronary arteries-the vessels that supply oxygen-containing blood to the heart muscle. Calcified plaque results when there is a build-up of fat and other substances under the inner layer of the artery. This material can calcify which signals the presence of atherosclerosis, a disease of the vessel wall, also called coronary artery disease (CAD). People with this disease have an increased risk for heart attacks. In addition, over time, progression of plaque build up (CAD) can narrow the arteries or even close off blood flow to the heart. The result may be chest pain, sometimes called "angina," or a heart  attack. Because calcium is a marker of CAD, the amount of calcium detected on a cardiac CT scan is a helpful prognostic tool. The findings on cardiac CT are expressed as a calcium score. Another name for this test is coronary artery calcium scoring.  What are some common uses of the procedure? The goal of cardiac CT scan for calcium  scoring is to determine if CAD is present and to what extent, even if there are no symptoms. It is a screening study that may be recommended by a physician for patients with risk factors for CAD but no clinical symptoms. The major risk factors for CAD are: . high blood cholesterol levels  . family history of heart attacks  . diabetes  . high blood pressure  . cigarette smoking  . overweight or obese  . physical inactivity   A negative cardiac CT scan for calcium scoring shows no calcification within the coronary arteries. This suggests that CAD is absent or so minimal it cannot be seen by this technique. The chance of having a heart attack over the next two to five years is very low under these circumstances. A positive test means that CAD is present, regardless of whether or not the patient is experiencing any symptoms. The amount of calcification-expressed as the calcium score-may help to predict the likelihood of a myocardial infarction (heart attack) in the coming years and helps your medical doctor or cardiologist decide whether the patient may need to take preventive medicine or undertake other measures such as diet and exercise to lower the risk for heart attack. The extent of CAD is graded according to your calcium score:  Calcium Score  Presence of CAD (coronary artery disease)  0 No evidence of CAD   1-10 Minimal evidence of CAD  11-100 Mild evidence of CAD  101-400 Moderate evidence of CAD  Over 400 Extensive evidence of CAD     If you have medicare related insurance (such as traditional Medicare, [employer], [employer], or similar), Please make an appointment at the scheduling desk with Sharee Pimple, the [employer], for your Wellness visit in this office, which is a benefit with your insurance.

## 2018-09-16 NOTE — Progress Notes (Signed)
Subjective:  Patient ID: Judy Carlson, female    DOB: 11/10/1950  Age: [age] y.o. MRN: [id number]  CC: No chief complaint on file.   HPI Judy BrochureJoyce S Mandella presents for HTN, allergies C/o dark stools - starting May 2000. The pt has started iron po in May per her  [facility] order. Pt was on Keto diet - stopped. Had Colonosc in 2017 Dr Myrtie Neitheranis  Outpatient Medications Prior to Visit  Medication Sig Dispense Refill  . amLODipine (NORVASC) 10 MG tablet Take 1 tablet (10 mg total) by mouth daily. 90 tablet 3  . Cholecalciferol (VITAMIN D) 2000 UNITS CAPS Take 2,000 Units by mouth daily.    . Ferrous Sulfate (IRON PO) Take by mouth.    . furosemide (LASIX) 40 MG tablet Take 1 tablet (40 mg total) by mouth daily as needed. 90 tablet 3  . loratadine (CLARITIN) 10 MG tablet Take 10 mg by mouth daily.    . meloxicam (MOBIC) 7.5 MG tablet Take by mouth.    . metoprolol succinate (TOPROL-XL) 100 MG 24 hr tablet TAKE ONE TABLET BY MOUTH ONCE DAILY WITH OR IMMEDIATELY FOLLOWING A MEAL. 90 tablet 3  . Omega-3 Fatty Acids (FISH OIL PO) Take by mouth.    Marland Kitchen. POTASSIUM PO Take by mouth.    . sulfamethoxazole-trimethoprim (BACTRIM DS,SEPTRA DS) 800-160 MG tablet Take 1 tablet by mouth 2 (two) times daily. 20 tablet 0   No facility-administered medications prior to visit.     ROS: Review of Systems  Constitutional: Negative for activity change, appetite change, chills, fatigue and unexpected weight change.  HENT: Negative for congestion, mouth sores and sinus pressure.   Eyes: Negative for visual disturbance.  Respiratory: Negative for cough and chest tightness.   Gastrointestinal: Negative for abdominal pain, blood in stool, nausea and rectal pain.  Genitourinary: Negative for difficulty urinating, frequency and vaginal pain.  Musculoskeletal: Negative for back pain and gait problem.  Skin: Negative for pallor and rash.  Neurological: Negative for dizziness, tremors, weakness, numbness and headaches.   Psychiatric/Behavioral: Negative for confusion, sleep disturbance and suicidal ideas.    Objective:  BP 122/78 (BP Location: Right Arm, Patient Position: Sitting, Cuff Size: Normal)   Pulse 80   Temp 98.4 F (36.9 C) (Oral)   Ht 5\' 4"  (1.626 m)   Wt 187 lb (84.8 kg)   SpO2 98%   BMI 32.10 kg/m   BP Readings from Last 3 Encounters:  09/16/18 122/78  05/14/18 118/70  03/17/18 122/76    Wt Readings from Last 3 Encounters:  09/16/18 187 lb (84.8 kg)  05/14/18 188 lb (85.3 kg)  03/17/18 186 lb (84.4 kg)    Physical Exam Constitutional:      General: She is not in acute distress.    Appearance: She is well-developed.  HENT:     Head: Normocephalic.     Right Ear: External ear normal.     Left Ear: External ear normal.     Nose: Nose normal.  Eyes:     General:        Right eye: No discharge.        Left eye: No discharge.     Conjunctiva/sclera: Conjunctivae normal.     Pupils: Pupils are equal, round, and reactive to light.  Neck:     Musculoskeletal: Normal range of motion and neck supple.     Thyroid: No thyromegaly.     Vascular: No JVD.     Trachea: No tracheal deviation.  Cardiovascular:     Rate and Rhythm: Normal rate and regular rhythm.     Heart sounds: Normal heart sounds.  Pulmonary:     Effort: No respiratory distress.     Breath sounds: No stridor. No wheezing.  Abdominal:     General: Bowel sounds are normal. There is no distension.     Palpations: Abdomen is soft. There is no mass.     Tenderness: There is no abdominal tenderness. There is no guarding or rebound.  Musculoskeletal:        General: No tenderness.  Lymphadenopathy:     Cervical: No cervical adenopathy.  Skin:    Findings: No erythema or rash.  Neurological:     Cranial Nerves: No cranial nerve deficit.     Motor: No abnormal muscle tone.     Coordination: Coordination normal.     Deep Tendon Reflexes: Reflexes normal.  Psychiatric:        Behavior: Behavior normal.         Thought Content: Thought content normal.        Judgment: Judgment normal.   obese Arcus senilis B eyes  Lab Results  Component Value Date   WBC 6.3 10/28/2017   HGB 13.4 10/28/2017   HCT 44.6 10/28/2017   PLT 385 09/02/2017   GLUCOSE 107 (H) 12/12/2017   CHOL 275 (H) 12/12/2017   TRIG 114.0 12/12/2017   HDL 60.30 12/12/2017   LDLDIRECT 132.9 04/30/2007   LDLCALC 192 (H) 12/12/2017   ALT 18 12/12/2017   AST 16 12/12/2017   NA 141 12/12/2017   K 3.7 12/12/2017   CL 107 12/12/2017   CREATININE 1.00 12/12/2017   BUN 11 12/12/2017   CO2 25 12/12/2017   TSH 1.440 10/28/2017   INR 0.97 05/14/2013   HGBA1C 5.9 12/12/2017    Dg Bone Density  Result Date: 07/24/2017 EXAM: DUAL X-RAY ABSORPTIOMETRY (DXA) FOR BONE MINERAL DENSITY IMPRESSION: Dear Dr. [name], Your patient Anthonette LegatoJoyce Ruark completed a BMD test on 07/24/2017 using the Lunar iDXA DXA System (analysis version: 14.10) manufactured by [employer]. The following summarizes the results of our evaluation. PATIENT BIOGRAPHICAL: Name: Judy Brochuredwards, Fusako S Patient ID: [id number] Birth Date: Jun 21, 1950 Height: 64.0 in. Gender: Female Exam Date: 07/24/2017 Weight: 180.7 lbs. Indications: Early Menopause, Height Loss, Hysterectomy, Oophorectomy Bilateral, Osteoarthritis, Postmenopausal, Rheumatoid Arthritis, Surgical Induced Menopause, Tobacco User(Current Smoker) Fractures: Treatments: Vitamin D ASSESSMENT: The BMD measured at Femur Neck Right is 1.080 g/cm2 with a T-score of 0.3. This patient is considered normal according to World Health Organization [facility](WHO) criteria. Site Region Measured Measured WHO Young Adult BMD Date       Age      Classification T-score AP Spine L1-L4 07[age]24/2017 68.0 Normal 0.7 1.276 g/cm2 DualFemur Neck Right 07/24/2017 68.0 Normal 0.3 1.080 g/cm2 World Health Organization [facility](WHO) criteria for post-menopausal, Caucasian Women: Normal:       T-score at or above -1 SD Osteopenia:   T-score between -1 and -2.5 SD  Osteoporosis: T-score at or below -2.5 SD RECOMMENDATIONS: 1. All patients should optimize calcium and vitamin D intake. 2. Consider FDA-approved medical therapies in postmenopausal women and men aged 68 years and older, based on the following: a. A hip or vertebral(clinical or morphometric) fracture b. T-score < -2.5 at the femoral neck or spine after appropriate evaluation to exclude secondary causes c. Low bone mass (T-score between -1.0 and -2.5 at the femoral neck or spine) and a 10-year probability of a hip fracture > 3%  or a 10-year probability of a major osteoporosis-related fracture > 20% based on the [place]-adapted WHO algorithm d. Clinician judgment and/or patient preferences may indicate treatment for people with 10-year fracture probabilities above or below these levels FOLLOW-UP: People with diagnosed cases of osteoporosis or at high risk for fracture should have regular bone mineral density tests. For patients eligible for Medicare, routine testing is allowed once every 2 years. The testing frequency can be increased to one year for patients who have rapidly progressing disease, those who are receiving or discontinuing medical therapy to restore bone mass, or have additional risk factors. I have reviewed this report, and agree with the above findings. [facility] Electronically Signed   By: Lowella Grip III M.D.   On: 07/24/2017 13:56   Mm Digital Screening Bilateral  Result Date: 07/24/2017 CLINICAL DATA:  Screening. EXAM: DIGITAL SCREENING BILATERAL MAMMOGRAM WITH CAD COMPARISON:  Previous exam(s). ACR Breast Density Category b: There are scattered areas of fibroglandular density. FINDINGS: There are no findings suspicious for malignancy. Images were processed with CAD. IMPRESSION: No mammographic evidence of malignancy. A result letter of this screening mammogram will be mailed directly to the patient. RECOMMENDATION: Screening mammogram in one year. (Code:SM-B-01Y) BI-RADS CATEGORY   1: Negative. Electronically Signed   By: Ammie Ferrier M.D.   On: 07/24/2017 15:22    Assessment & Plan:   There are no diagnoses linked to this encounter.   No orders of the defined types were placed in this encounter.    Follow-up: No follow-ups on file.  Walker Kehr, MD

## 2018-09-16 NOTE — Assessment & Plan Note (Signed)
Stopped smoking May 2020

## 2018-09-17 LAB — IRON,TIBC AND FERRITIN PANEL
%SAT: 26 % (calc) (ref 16–45)
Ferritin: 31 ng/mL (ref 16–288)
Iron: 98 ug/dL (ref 45–160)
TIBC: 382 mcg/dL (calc) (ref 250–450)

## 2018-10-06 ENCOUNTER — Ambulatory Visit
Admission: RE | Admit: 2018-10-06 | Discharge: 2018-10-06 | Disposition: A | Discharge status: Home / Self Care | Payer: Medicare HMO | Source: Ambulatory Visit | Attending: Internal Medicine | Admitting: Internal Medicine

## 2018-10-06 DIAGNOSIS — Z1231 Encounter for screening mammogram for malignant neoplasm of breast: Secondary | ICD-10-CM

## 2018-10-24 ENCOUNTER — Telehealth: Payer: Self-pay | Admitting: *Deleted

## 2018-10-24 DIAGNOSIS — E785 Hyperlipidemia, unspecified: Secondary | ICD-10-CM

## 2018-10-24 NOTE — Telephone Encounter (Signed)
Copied from [place] [phone]. Topic: General - Other >> Oct 24, 2018 12:02 PM Celene Kras A wrote: Reason for CRM: Pt called and is needing to schedule a cardiac ct calcium scoring test done, but there has been no order placed. Pt is requesting to know when the order is placed. Please advise.

## 2018-10-25 NOTE — Telephone Encounter (Signed)
Will do. Thanks.

## 2018-10-27 NOTE — Telephone Encounter (Signed)
Pt informed of below and advised that she will receive another phone call to arrange a date/time to have testing.

## 2018-11-06 ENCOUNTER — Encounter: Payer: Self-pay | Admitting: Internal Medicine

## 2018-11-06 ENCOUNTER — Ambulatory Visit (INDEPENDENT_AMBULATORY_CARE_PROVIDER_SITE_OTHER): Payer: Medicare HMO | Admitting: Internal Medicine

## 2018-11-06 ENCOUNTER — Other Ambulatory Visit: Payer: Self-pay

## 2018-11-06 DIAGNOSIS — R6889 Other general symptoms and signs: Secondary | ICD-10-CM

## 2018-11-06 DIAGNOSIS — Z20822 Contact with and (suspected) exposure to covid-19: Secondary | ICD-10-CM

## 2018-11-06 DIAGNOSIS — U071 COVID-19: Secondary | ICD-10-CM | POA: Insufficient documentation

## 2018-11-06 NOTE — Progress Notes (Signed)
Virtual Visit via Video Note  I connected with Judy Carlson on 11/06/18 at 11:00 AM EDT by a video enabled telemedicine application and verified that I am speaking with the correct person using two identifiers.  The patient and the provider were at separate locations throughout the entire encounter.   I discussed the limitations of evaluation and management by telemedicine and the availability of in person appointments. The patient expressed understanding and agreed to proceed.  History of Present Illness: The patient is a 68 y.o. female with visit for chills, headaches, body aches, sore throat and dry cough starting yesterday. 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 She was in the heat for an extended time and she thought that this was the cause. She went home and drank extra fluids and thought that this would help. She did not feel any better today. She is still having cough, sore throat, chills. She is taking tylenol so no fever today. Started yesterday. Denies SOB. Overall it is not improving. Has tried tylenol which helps headaches okay.   Observations/Objective: Appearance: normal, breathing appears normal, some mild cough during visit, casual grooming, abdomen does not appear distended, throat not well examined due to video quality, mental status is A and O times 3  Assessment and Plan: See problem oriented charting  Follow Up Instructions: covid-19 testing  Visit time 25 minutes: greater than 50% of that time was spent in face to face counseling and coordination of care with the patient: counseled about course, likely etiology, need to quarantine until results return  I discussed the assessment and treatment plan with the patient. The patient was provided an opportunity to ask questions and all were answered. The patient agreed with the plan and demonstrated an understanding of the instructions.   The patient was advised to call back or seek an in-person  evaluation if the symptoms worsen or if the condition fails to improve as anticipated.  Hoyt Koch, MD

## 2018-11-06 NOTE — Assessment & Plan Note (Signed)
Could be related to heat although all symptoms are not typical for dehydration. She is not improved at all today so warrants covid-19 testing. She is advised to quarantine until results return.

## 2018-11-07 LAB — NOVEL CORONAVIRUS, NAA: SARS-CoV-2, NAA: DETECTED — AB

## 2018-11-12 ENCOUNTER — Telehealth: Payer: Self-pay | Admitting: Internal Medicine

## 2018-11-12 NOTE — Telephone Encounter (Signed)
FYI

## 2018-11-12 NOTE — Telephone Encounter (Signed)
Patient calling as Judy Carlson, as advised by health department, to contact PCP as she has tested positive for covid-19. She states she feels okay but still have slight cold symptoms such as sneezing and headache.

## 2018-11-17 ENCOUNTER — Telehealth: Payer: Self-pay | Admitting: Internal Medicine

## 2018-11-17 NOTE — Telephone Encounter (Signed)
Pt tested positive covid 19 on 11/07/2018. States her husband tested positive as well and has since been hospitalized, 9/10 or 9/11 per wife's report. Reports Health Dept called her today and stated she should call her PCP.  Pt was notified of positive results on 11/12/2018, after several attempts to reach 11/07/2018. Dr. Sharlet Salina ,aware 11/07/2018. She was given instructions for quarantine and precautions,also symptoms that warrant ED visit. All noted by Kenney Houseman NP at [facility].  Pt states she is aware of that but thought Dr. Alain Marion would be calling her.   Pt reports her symptoms "Come and go." Stated felt feverish, had pt check temp during call, 98.0. Reports mild scratchy throat at times, decreased appetite and decreased energy "Some days." States is staying hydrated. Also reports headache some days. Care advise given per protocol via home monitoring symptoms. How to treat symptoms, when to go to ED. Pt verbalizes understanding.   Pt is also concerned as her husband is to be discharged home tomorrow. Questioned if she would like a CB from practice, Dr. Alain Marion, Pt states "Well I don't know what else he would have to say."  Dr. Jenny Reichmann called my husband so I thought Dr. Alain Marion would call me."  Assured [place] would route to practice for Dr. Judeen Hammans review.  CB# [phone]

## 2018-11-18 NOTE — Telephone Encounter (Signed)
FYI

## 2018-11-19 NOTE — Telephone Encounter (Signed)
Noted. Thx.

## 2018-12-09 ENCOUNTER — Ambulatory Visit (INDEPENDENT_AMBULATORY_CARE_PROVIDER_SITE_OTHER): Payer: Medicare HMO | Admitting: Internal Medicine

## 2018-12-09 ENCOUNTER — Ambulatory Visit: Payer: Self-pay | Admitting: *Deleted

## 2018-12-09 ENCOUNTER — Encounter: Payer: Self-pay | Admitting: Internal Medicine

## 2018-12-09 DIAGNOSIS — U071 COVID-19: Secondary | ICD-10-CM | POA: Diagnosis not present
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
## 2018-12-09 NOTE — Assessment & Plan Note (Signed)
Tested positive for COVID-nineteen 1 month ago Symptoms improved, but then seemed to gotten worse again She is having intermittent symptoms-good days and bad days Symptoms include feeling hot and cold, but no fevers, fatigue, nasal congestion, minimal slight sore throat, minimal cough, improving diarrhea, nausea, neck pain, headaches and decreased appetite/decreased taste  Discussed that it is not uncommon to have COVID symptoms persist for a month or longer No concerning symptoms that warrants further evaluation or treatment at this time Advised symptomatic treatment Advised to call if she develops any concerning symptoms or if she has other questions.

## 2018-12-09 NOTE — Telephone Encounter (Signed)
Your 1:00

## 2018-12-09 NOTE — Telephone Encounter (Signed)
Message from Beverley Fiedler sent at 12/09/2018 12:05 PM EDT  Summary: Clinical Advice    Patients stated that she has the nausea,fever, chills and recently tested positive for covid. Patient stated that symptoms haven't gone away and she would like to speak with nurse about symptoms. Please advise           Reason for Disposition . HIGH RISK patient (e.g., age > 26 years, diabetes, heart or lung disease, weak immune system) (Exception: Has already been evaluated by healthcare provider and has no new or worsening symptoms)  Answer Assessment - Initial Assessment Questions 1. COVID-19 DIAGNOSIS: "Who made your Coronavirus (COVID-19) diagnosis?" "Was it confirmed by a positive lab test?" If not diagnosed by a HCP, ask "Are there lots of cases (community spread) where you live?" (See public health department website, if unsure)     11/06/2018 [place] 2. ONSET: "When did the COVID-19 symptoms start?"      Last week August  3. WORST SYMPTOM: "What is your worst symptom?" (e.g., cough, fever, shortness of breath, muscle aches)    Chills 4. COUGH: "Do you have a cough?" If so, ask: "How bad is the cough?"       Mild cough occasional, non productive  5. FEVER: "Do you have a fever?" If so, ask: "What is your temperature, how was it measured, and when did it start?"    No fever  6. RESPIRATORY STATUS: "Describe your breathing?" (e.g., shortness of breath, wheezing, unable to speak)      No shortness of breath or wheezing 7. BETTER-SAME-WORSE: "Are you getting better, staying the same or getting worse compared to yesterday?"  If getting worse, ask, "In what way?"     Worse today- chills and fatigue  8. HIGH RISK DISEASE: "Do you have any chronic medical problems?" (e.g., asthma, heart or lung disease, weak immune system, etc.)     Denies all  9. PREGNANCY: "Is there any chance you are pregnant?" "When was your last menstrual period?"     N/A 10. OTHER SYMPTOMS: "Do you have any other symptoms?"   (e.g., chills, fatigue, headache, loss of smell or taste, muscle pain, sore throat)       Cough, fatigue, dizziness, nausea, headache  Protocols used: CORONAVIRUS (COVID-19) DIAGNOSED OR SUSPECTED-A-AH   Patient states she has continued to have COVID 19 symptoms that are not improving over the past month.  Her symptoms include chills, headache, nausea, fatigue dizziness, and cough.  She has the symptoms intermittently, and does have a variety of these symptoms each day.  States her temperature has been around 98.0 daily.  She denies shortness of breath.  Given patient's prolonged symptoms and feeling worse today- recommended a virtual visit with provider at PCP office today.  Patient agreeable.  She is scheduled for virtual visit today at 1pm with Dr. Quay Burow.

## 2018-12-10 ENCOUNTER — Inpatient Hospital Stay: Admission: RE | Admit: 2018-12-10 | Payer: Medicare HMO | Source: Ambulatory Visit

## 2018-12-23 ENCOUNTER — Observation Stay (HOSPITAL_COMMUNITY): Payer: Medicare HMO

## 2018-12-23 ENCOUNTER — Emergency Department (HOSPITAL_COMMUNITY): Payer: Medicare HMO

## 2018-12-23 ENCOUNTER — Other Ambulatory Visit: Payer: Self-pay

## 2018-12-23 ENCOUNTER — Inpatient Hospital Stay (HOSPITAL_COMMUNITY)
Admission: EM | Admit: 2018-12-23 | Discharge: 2018-12-25 | DRG: 123 | Disposition: A | Discharge status: Home / Self Care | Payer: Medicare HMO | Attending: Internal Medicine | Admitting: Internal Medicine

## 2018-12-23 DIAGNOSIS — Z8619 Personal history of other infectious and parasitic diseases: Secondary | ICD-10-CM | POA: Diagnosis not present

## 2018-12-23 DIAGNOSIS — K219 Gastro-esophageal reflux disease without esophagitis: Secondary | ICD-10-CM | POA: Diagnosis not present

## 2018-12-23 DIAGNOSIS — Z90710 Acquired absence of both cervix and uterus: Secondary | ICD-10-CM | POA: Diagnosis not present

## 2018-12-23 DIAGNOSIS — I6521 Occlusion and stenosis of right carotid artery: Secondary | ICD-10-CM | POA: Diagnosis present

## 2018-12-23 DIAGNOSIS — Z6831 Body mass index (BMI) 31.0-31.9, adult: Secondary | ICD-10-CM | POA: Diagnosis not present

## 2018-12-23 DIAGNOSIS — E785 Hyperlipidemia, unspecified: Secondary | ICD-10-CM | POA: Diagnosis not present

## 2018-12-23 DIAGNOSIS — Z91410 Personal history of adult physical and sexual abuse: Secondary | ICD-10-CM | POA: Diagnosis not present

## 2018-12-23 DIAGNOSIS — Z8249 Family history of ischemic heart disease and other diseases of the circulatory system: Secondary | ICD-10-CM

## 2018-12-23 DIAGNOSIS — R531 Weakness: Secondary | ICD-10-CM | POA: Diagnosis not present

## 2018-12-23 DIAGNOSIS — Z9841 Cataract extraction status, right eye: Secondary | ICD-10-CM

## 2018-12-23 DIAGNOSIS — Z888 Allergy status to other drugs, medicaments and biological substances status: Secondary | ICD-10-CM

## 2018-12-23 DIAGNOSIS — U071 COVID-19: Secondary | ICD-10-CM | POA: Diagnosis present

## 2018-12-23 DIAGNOSIS — H53461 Homonymous bilateral field defects, right side: Secondary | ICD-10-CM | POA: Diagnosis present

## 2018-12-23 DIAGNOSIS — G459 Transient cerebral ischemic attack, unspecified: Secondary | ICD-10-CM

## 2018-12-23 DIAGNOSIS — I7300 Raynaud's syndrome without gangrene: Secondary | ICD-10-CM | POA: Diagnosis present

## 2018-12-23 DIAGNOSIS — H3401 Transient retinal artery occlusion, right eye: Principal | ICD-10-CM | POA: Diagnosis present

## 2018-12-23 DIAGNOSIS — I10 Essential (primary) hypertension: Secondary | ICD-10-CM | POA: Diagnosis not present

## 2018-12-23 DIAGNOSIS — Z87891 Personal history of nicotine dependence: Secondary | ICD-10-CM

## 2018-12-23 DIAGNOSIS — H34231 Retinal artery branch occlusion, right eye: Secondary | ICD-10-CM | POA: Diagnosis present

## 2018-12-23 DIAGNOSIS — H3410 Central retinal artery occlusion, unspecified eye: Secondary | ICD-10-CM | POA: Diagnosis not present

## 2018-12-23 DIAGNOSIS — H547 Unspecified visual loss: Secondary | ICD-10-CM | POA: Diagnosis present

## 2018-12-23 DIAGNOSIS — R29818 Other symptoms and signs involving the nervous system: Secondary | ICD-10-CM | POA: Diagnosis not present

## 2018-12-23 DIAGNOSIS — Z8349 Family history of other endocrine, nutritional and metabolic diseases: Secondary | ICD-10-CM | POA: Diagnosis not present

## 2018-12-23 DIAGNOSIS — Z7289 Other problems related to lifestyle: Secondary | ICD-10-CM | POA: Diagnosis not present

## 2018-12-23 DIAGNOSIS — E559 Vitamin D deficiency, unspecified: Secondary | ICD-10-CM | POA: Diagnosis present

## 2018-12-23 DIAGNOSIS — I6523 Occlusion and stenosis of bilateral carotid arteries: Secondary | ICD-10-CM | POA: Diagnosis not present

## 2018-12-23 DIAGNOSIS — E669 Obesity, unspecified: Secondary | ICD-10-CM | POA: Diagnosis present

## 2018-12-23 DIAGNOSIS — Z791 Long term (current) use of non-steroidal anti-inflammatories (NSAID): Secondary | ICD-10-CM | POA: Diagnosis not present

## 2018-12-23 DIAGNOSIS — H3411 Central retinal artery occlusion, right eye: Secondary | ICD-10-CM | POA: Diagnosis not present

## 2018-12-23 DIAGNOSIS — Z79899 Other long term (current) drug therapy: Secondary | ICD-10-CM

## 2018-12-23 DIAGNOSIS — H579 Unspecified disorder of eye and adnexa: Secondary | ICD-10-CM

## 2018-12-23 DIAGNOSIS — Z8673 Personal history of transient ischemic attack (TIA), and cerebral infarction without residual deficits: Secondary | ICD-10-CM | POA: Diagnosis present

## 2018-12-23 HISTORY — DX: Unspecified disorder of eye and adnexa: H57.9

## 2018-12-23 LAB — CBC
HCT: 43.1 % (ref 36.0–46.0)
HCT: 41.9 % (ref 36.0–46.0)
Hemoglobin: 13.9 g/dL (ref 12.0–15.0)
Hemoglobin: 13.7 g/dL (ref 12.0–15.0)
MCH: 28.1 pg (ref 26.0–34.0)
MCH: 28.2 pg (ref 26.0–34.0)
MCHC: 32.3 g/dL (ref 30.0–36.0)
MCHC: 32.7 g/dL (ref 30.0–36.0)
MCV: 87.1 fL (ref 80.0–100.0)
MCV: 86.2 fL (ref 80.0–100.0)
Platelets: 356 10*3/uL (ref 150–400)
Platelets: 313 10*3/uL (ref 150–400)
RBC: 4.95 MIL/uL (ref 3.87–5.11)
RBC: 4.86 MIL/uL (ref 3.87–5.11)
RDW: 15.9 % — ABNORMAL HIGH (ref 11.5–15.5)
RDW: 15.7 % — ABNORMAL HIGH (ref 11.5–15.5)
WBC: 6.5 10*3/uL (ref 4.0–10.5)
WBC: 5.6 10*3/uL (ref 4.0–10.5)
nRBC: 0 % (ref 0.0–0.2)
nRBC: 0 % (ref 0.0–0.2)

## 2018-12-23 LAB — COMPREHENSIVE METABOLIC PANEL
ALT: 22 U/L (ref 0–44)
AST: 18 U/L (ref 15–41)
Albumin: 4 g/dL (ref 3.5–5.0)
Alkaline Phosphatase: 80 U/L (ref 38–126)
Anion gap: 11 (ref 5–15)
BUN: 16 mg/dL (ref 8–23)
CO2: 21 mmol/L — ABNORMAL LOW (ref 22–32)
Calcium: 10.1 mg/dL (ref 8.9–10.3)
Chloride: 109 mmol/L (ref 98–111)
Creatinine, Ser: 0.9 mg/dL (ref 0.44–1.00)
GFR calc Af Amer: >60 mL/min (ref >60)
GFR calc non Af Amer: >60 mL/min (ref >60)
Glucose, Bld: 115 mg/dL — ABNORMAL HIGH (ref 70–99)
Potassium: 4.1 mmol/L (ref 3.5–5.1)
Sodium: 141 mmol/L (ref 135–145)
Total Bilirubin: 0.7 mg/dL (ref 0.3–1.2)
Total Protein: 7.5 g/dL (ref 6.5–8.1)

## 2018-12-23 LAB — DIFFERENTIAL
Abs Immature Granulocytes: 0.02 10*3/uL (ref 0.00–0.07)
Basophils Absolute: 0 10*3/uL (ref 0.0–0.1)
Basophils Relative: 1 %
Eosinophils Absolute: 0.1 10*3/uL (ref 0.0–0.5)
Eosinophils Relative: 1 %
Immature Granulocytes: 0 %
Lymphocytes Relative: 26 %
Lymphs Abs: 1.7 10*3/uL (ref 0.7–4.0)
Monocytes Absolute: 0.9 10*3/uL (ref 0.1–1.0)
Monocytes Relative: 14 %
Neutro Abs: 3.8 10*3/uL (ref 1.7–7.7)
Neutrophils Relative %: 58 %

## 2018-12-23 LAB — CREATININE, SERUM
Creatinine, Ser: 0.92 mg/dL (ref 0.44–1.00)
GFR calc Af Amer: >60 mL/min (ref >60)
GFR calc non Af Amer: >60 mL/min (ref >60)

## 2018-12-23 LAB — HIV ANTIBODY (ROUTINE TESTING W REFLEX): HIV Screen 4th Generation wRfx: NONREACTIVE

## 2018-12-23 LAB — PROTIME-INR
INR: 0.9 (ref 0.8–1.2)
Prothrombin Time: 12.3 seconds (ref 11.4–15.2)

## 2018-12-23 LAB — SARS CORONAVIRUS 2 BY RT PCR (HOSPITAL ORDER, PERFORMED IN ~~LOC~~ HOSPITAL LAB): SARS Coronavirus 2: POSITIVE — AB

## 2018-12-23 MED ORDER — ACETAMINOPHEN 650 MG RE SUPP: 650.0000 mg | RECTAL | Status: DC | PRN

## 2018-12-23 MED ORDER — SENNOSIDES-DOCUSATE SODIUM 8.6-50 MG PO TABS: 1.0000 | ORAL_TABLET | Freq: Every evening | ORAL | Status: DC | PRN

## 2018-12-23 MED ORDER — PROCHLORPERAZINE EDISYLATE 10 MG/2ML IJ SOLN
5.0000 mg | Freq: Once | INTRAMUSCULAR | Status: AC
  Administered 2018-12-23: 5 mg via INTRAVENOUS
  Filled 2018-12-23: qty 2

## 2018-12-23 MED ORDER — DIPHENHYDRAMINE HCL 50 MG/ML IJ SOLN
12.5000 mg | Freq: Once | INTRAMUSCULAR | Status: AC
  Administered 2018-12-23: 12.5 mg via INTRAVENOUS
  Filled 2018-12-23: qty 1

## 2018-12-23 MED ORDER — VITAMIN D 25 MCG (1000 UNIT) PO TABS
2000.0000 [IU] | ORAL_TABLET | Freq: Every day | ORAL | Status: DC
  Administered 2018-12-24 – 2018-12-25 (×2): 2000 [IU] via ORAL
  Filled 2018-12-23 (×2): qty 2

## 2018-12-23 MED ORDER — ENOXAPARIN SODIUM 40 MG/0.4ML ~~LOC~~ SOLN
40.0000 mg | SUBCUTANEOUS | Status: DC
  Administered 2018-12-24 – 2018-12-25 (×2): 40 mg via SUBCUTANEOUS
  Filled 2018-12-23 (×2): qty 0.4

## 2018-12-23 MED ORDER — ACETAMINOPHEN 160 MG/5ML PO SOLN: 650.0000 mg | ORAL | Status: DC | PRN

## 2018-12-23 MED ORDER — STROKE: EARLY STAGES OF RECOVERY BOOK: Freq: Once | Status: DC

## 2018-12-23 MED ORDER — SODIUM CHLORIDE 0.9% FLUSH
3.0000 mL | Freq: Once | INTRAVENOUS | Status: AC
  Administered 2018-12-23: 3 mL via INTRAVENOUS

## 2018-12-23 MED ORDER — ASPIRIN EC 325 MG PO TBEC
325.0000 mg | DELAYED_RELEASE_TABLET | Freq: Every day | ORAL | Status: DC
  Administered 2018-12-24 – 2018-12-25 (×2): 325 mg via ORAL
  Filled 2018-12-23 (×2): qty 1

## 2018-12-23 MED ORDER — OMEGA-3-ACID ETHYL ESTERS 1 G PO CAPS
1.0000 g | ORAL_CAPSULE | Freq: Every day | ORAL | Status: DC
  Administered 2018-12-24 – 2018-12-25 (×2): 1 g via ORAL
  Filled 2018-12-23 (×2): qty 1

## 2018-12-23 MED ORDER — ACETAMINOPHEN 325 MG PO TABS
650.0000 mg | ORAL_TABLET | ORAL | Status: DC | PRN
  Administered 2018-12-24 – 2018-12-25 (×4): 650 mg via ORAL
  Filled 2018-12-23 (×4): qty 2

## 2018-12-23 NOTE — ED Notes (Signed)
Pt sent by eye MD because she is experiencing a curtain closing over her OD.  3 days ago felt "bad" like her Covid episode.  States those sx improved today.  Some weakness noted today less than 3 days ago. Alert and oriented.

## 2018-12-23 NOTE — ED Notes (Signed)
Attempted to call report
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
## 2018-12-23 NOTE — ED Provider Notes (Signed)
Assumed care of patient at 3 PM.  Patient here with acute right central artery occlusion.  Here for stroke work-up.  Awaiting neurology recommendations.  CT scan unremarkable.  Patient had vision loss yesterday with right upper extremity weakness that has mostly resolved at this time.  MRI has been ordered.  Anticipate admission for stroke work-up.  Patient with history of high cholesterol, hypertension.  Lab work unremarkable.  EKG shows sinus rhythm.  Neurology recommends admission for stroke work-up.  Admitted to hospitalist service in stable condition.  This chart was dictated using voice recognition software.  Despite best efforts to proofread,  errors can occur which can change the documentation meaning.     Lennice Sites, DO 12/23/18 1705

## 2018-12-23 NOTE — ED Notes (Signed)
Requested secretary page admitting for pt to change admitting order

## 2018-12-23 NOTE — ED Triage Notes (Signed)
Pt in for generalized weakness since Saturday. States yesterday, she had a "partial blockage" of her R eye. Went to eye doctor today, he sent her for possible stroke eval and temporal artery occlusion. Vision still not fully resolved since. Hx of Covid+ 9/7, "feels similar HA and weakness today as she did during Covid".

## 2018-12-23 NOTE — ED Notes (Signed)
Neuro in with pt for consult.

## 2018-12-23 NOTE — ED Notes (Addendum)
Provider made aware of positive COVID.  RN explained to family that spouse would have to leave. Pt and family understanding
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
## 2018-12-23 NOTE — ED Notes (Signed)
Tele   Paged admitting DR to RN Anderson Malta
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
## 2018-12-24 ENCOUNTER — Inpatient Hospital Stay (HOSPITAL_COMMUNITY): Payer: Medicare HMO

## 2018-12-24 ENCOUNTER — Encounter (HOSPITAL_COMMUNITY): Payer: Self-pay

## 2018-12-24 DIAGNOSIS — G459 Transient cerebral ischemic attack, unspecified: Secondary | ICD-10-CM

## 2018-12-24 DIAGNOSIS — U071 COVID-19: Secondary | ICD-10-CM

## 2018-12-24 LAB — ECHOCARDIOGRAM LIMITED
Height: 65 in
Weight: 2994.73 oz

## 2018-12-24 LAB — LIPID PANEL
Cholesterol: 242 mg/dL — ABNORMAL HIGH (ref 0–200)
HDL: 62 mg/dL (ref >40)
LDL Cholesterol: 160 mg/dL — ABNORMAL HIGH (ref 0–99)
Total CHOL/HDL Ratio: 3.9 RATIO
Triglycerides: 101 mg/dL (ref <150)
VLDL: 20 mg/dL (ref 0–40)

## 2018-12-24 LAB — HEMOGLOBIN A1C
Hgb A1c MFr Bld: 5.8 % — ABNORMAL HIGH (ref 4.8–5.6)
Mean Plasma Glucose: 119.76 mg/dL

## 2018-12-24 LAB — RAPID URINE DRUG SCREEN, HOSP PERFORMED
Amphetamines: NOT DETECTED
Barbiturates: NOT DETECTED
Benzodiazepines: NOT DETECTED
Cocaine: NOT DETECTED
Opiates: NOT DETECTED
Tetrahydrocannabinol: NOT DETECTED

## 2018-12-24 MED ORDER — EZETIMIBE 10 MG PO TABS
10.0000 mg | ORAL_TABLET | Freq: Every day | ORAL | Status: DC
  Administered 2018-12-24 – 2018-12-25 (×2): 10 mg via ORAL
  Filled 2018-12-24 (×2): qty 1

## 2018-12-24 MED ORDER — IBUPROFEN 200 MG PO TABS
400.0000 mg | ORAL_TABLET | Freq: Four times a day (QID) | ORAL | Status: DC | PRN
  Administered 2018-12-24: 400 mg via ORAL
  Filled 2018-12-24: qty 2

## 2018-12-24 MED ORDER — CLOPIDOGREL BISULFATE 75 MG PO TABS
75.0000 mg | ORAL_TABLET | Freq: Every day | ORAL | Status: DC
  Administered 2018-12-24 – 2018-12-25 (×2): 75 mg via ORAL
  Filled 2018-12-24 (×2): qty 1

## 2018-12-24 MED ORDER — IOHEXOL 350 MG/ML SOLN
75.0000 mL | Freq: Once | INTRAVENOUS | Status: AC | PRN
  Administered 2018-12-24: 75 mL via INTRAVENOUS
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
## 2018-12-24 NOTE — Progress Notes (Signed)
STROKE TEAM PROGRESS NOTE   INTERVAL HISTORY Pt sitting in bed, no complains. Stated that she had sudden loss of right eye inferior nasal part of vision yesterday, dark color, but today it became light grey so seems to improved some. Also complains of right hand tingling. CTA neck right ICA 75% stenosis with calcified plaque. [facility]VS consulted and consider CEA in 2 weeks. Pt still has COVID + test this time.   Vitals:   12/23/18 2230 12/23/18 2300 12/23/18 2337 12/24/18 0947  BP: 130/80 123/71 135/75 128/82  Pulse: 65 63 63 78  Resp:   20   Temp:   98.5 F (36.9 C) 98.3 F (36.8 C)  TempSrc:   Oral Oral  SpO2: 96% 96% 96% 99%  Weight:   84.9 kg   Height:    (1.651 m)     CBC:  Recent Labs  Lab 12/23/18 1317 12/23/18 1802  WBC 6.5 5.6  NEUTROABS 3.8  --   HGB 13.9 13.7  HCT 43.1 41.9  MCV 87.1 86.2  PLT 356 313    Basic Metabolic Panel:  Recent Labs  Lab 12/23/18 1317 12/23/18 1802  NA 141  --   K 4.1  --   CL 109  --   CO2 21*  --   GLUCOSE 115*  --   BUN 16  --   CREATININE 0.90 0.92  CALCIUM 10.1  --    Lipid Panel:     Component Value Date/Time   CHOL 242 (H) 12/24/2018 0458   CHOL 280 (H) 09/02/2017 1537   CHOL 241 (H) 06/19/2011 1233   TRIG 101 12/24/2018 0458   TRIG 71 06/19/2011 1233   TRIG 83 12/31/2005 0719   HDL 62 12/24/2018 0458   HDL 72 09/02/2017 1537   HDL 84 (H) 06/19/2011 1233   CHOLHDL 3.9 12/24/2018 0458   VLDL 20 12/24/2018 0458   VLDL 14 06/19/2011 1233   LDLCALC 160 (H) 12/24/2018 0458   LDLCALC 182 (H) 09/02/2017 1537   LDLCALC 143 (H) 06/19/2011 1233   HgbA1c:  Lab Results  Component Value Date   HGBA1C 5.8 (H) 12/24/2018   Urine Drug Screen:     Component Value Date/Time   LABOPIA NONE DETECTED 12/24/2018 0124   COCAINSCRNUR NONE DETECTED 12/24/2018 0124   LABBENZ NONE DETECTED 12/24/2018 0124   AMPHETMU NONE DETECTED 12/24/2018 0124   THCU NONE DETECTED 12/24/2018 0124   LABBARB NONE DETECTED 12/24/2018 0124     Alcohol Level     Component Value Date/Time   ETH <11 08/24/2013 1805    IMAGING Ct Angio Head W Or Wo Contrast  Result Date: 12/24/2018 CLINICAL DATA:  Initial evaluation for right branch retinal artery occlusion. EXAM: CT ANGIOGRAPHY HEAD AND NECK TECHNIQUE: Multidetector CT imaging of the head and neck was performed using the standard protocol during bolus administration of intravenous contrast. Multiplanar CT image reconstructions and MIPs were obtained to evaluate the vascular anatomy. Carotid stenosis measurements (when applicable) are obtained utilizing NASCET criteria, using the distal internal carotid diameter as the denominator. CONTRAST:  75mL OMNIPAQUE IOHEXOL 350 MG/ML SOLN COMPARISON:  Prior CT from 12/23/2018. FINDINGS: CTA NECK FINDINGS Aortic arch: Visualized aortic arch of normal caliber with normal 3 vessel morphology. Moderate atherosclerotic change about the arch and origin of the great vessels. Associated stenosis of up to approximately 40% at the origin of the left subclavian artery. No other hemodynamically significant stenosis about the origin the great vessels. Visualized subclavian arteries otherwise patent.  Right carotid system: Right CCA patent from its origin to the bifurcation without stenosis. Bulky calcified plaque about the right bifurcation/proximal right ICA with associated short-segment stenosis of up to 80% by NASCET criteria. Right ICA widely patent distally to the skull base without stenosis, dissection, or occlusion. Left carotid system: Left CCA patent from its origin to the bifurcation without stenosis. Scattered calcified plaque about the left bifurcation with associated stenosis of up to 40% by NASCET criteria. Left ICA otherwise patent to the skull base without stenosis, dissection, or occlusion. Vertebral arteries: Both vertebral arteries arise from the subclavian arteries. Left vertebral artery dominant. Atheromatous plaque about the origin of the vertebral  arteries bilaterally without significant stenosis on the left. There is severe at least 80% stenosis at the origin of the right vertebral artery. Vertebral arteries otherwise widely patent without stenosis, dissection, or occlusion. Skeleton: No acute osseous finding. No discrete lytic or blastic osseous lesions. Moderate cervical spondylosis at C3-4 through C6-7. Other neck: No other acute soft tissue abnormality within the neck. No mass lesion or adenopathy. Upper chest: Visualized upper chest demonstrates no acute finding. Review of the MIP images confirms the above findings CTA HEAD FINDINGS Anterior circulation: Petrous segments widely patent bilaterally. Multifocal atheromatous plaque throughout the carotid siphons with associated moderate multifocal narrowing. ICA termini well perfused. A1 segments patent bilaterally. Normal anterior communicating artery complex. Anterior cerebral arteries widely patent to their distal aspects without stenosis. No M1 stenosis or occlusion. Negative MCA bifurcations. Distal MCA branches well perfused and symmetric. Distal small vessel atheromatous irregularity noted. Posterior circulation: Dominant left vertebral artery patent to the vertebrobasilar junction without hemodynamically significant stenosis. Patent left PICA. Short-segment severe proximal right V4 stenosis noted (series 6, image 343). Right V4 segment otherwise patent. Right PICA not visualized. Basilar patent to its distal aspect without stenosis. Superior cerebral arteries patent proximally. Both posterior cerebral arteries primarily supplied via the basilar and are well perfused to their distal aspects. Venous sinuses: Grossly patent allowing for timing the contrast bolus. Anatomic variants: None significant. Review of the MIP images confirms the above findings IMPRESSION: 1. Negative CTA for large vessel occlusion. 2. Severe short-segment 80% atheromatous stenosis about the right bifurcation/proximal right ICA.  3. 40% atheromatous stenosis about the left bifurcation/proximal left ICA. 4. Short-segment severe stenoses at the origin of the right vertebral as well as at the right V4 segment. Left vertebral artery dominant without hemodynamically significant stenosis. 5. Moderate atherosclerotic change about the aortic arch and carotid siphons without high-grade stenosis. Electronically Signed   By: Rise Mu M.D.   On: 12/24/2018 04:35   Dg Chest 2 View  Result Date: 12/23/2018 CLINICAL DATA:  Generalize weakness since Saturday. EXAM: CHEST - 2 VIEW COMPARISON:  Chest x-ray 10/28/2017 FINDINGS: The cardiac silhouette, mediastinal and hilar contours are within normal limits and stable. Mild tortuosity and minimal calcification of the thoracic aorta. The lungs are clear. No infiltrates or effusions. No pulmonary edema. No worrisome pulmonary lesions. The bony thorax is intact. IMPRESSION: No acute cardiopulmonary findings. Electronically Signed   By: Rudie Meyer M.D.   On: 12/23/2018 18:00   Ct Head Wo Contrast  Result Date: 12/23/2018 CLINICAL DATA:  Partial blockage of RIGHT eye, possible stroke evaluation and temporal artery occlusion, focal neural deficit of greater than 6 hours, history of hypertension, former smoker EXAM: CT HEAD WITHOUT CONTRAST TECHNIQUE: Contiguous axial images were obtained from the base of the skull through the vertex without intravenous contrast. Sagittal and coronal MPR images  reconstructed from axial data set. COMPARISON:  05/14/2013 FINDINGS: Brain: Normal ventricular morphology. No midline shift or mass effect. Normal appearance of brain parenchyma. No intracranial hemorrhage, mass lesion or evidence of acute infarction. No extra-axial fluid collections. Vascular: Mild atherosclerotic calcifications at internal carotid arteries bilaterally at skull base. Skull: Intact Sinuses/Orbits: No acute findings Other: N/A IMPRESSION: No acute intracranial abnormalities.  Electronically Signed   By: Lavonia Dana M.D.   On: 12/23/2018 13:51   Ct Angio Neck W Or Wo Contrast  Result Date: 12/24/2018 CLINICAL DATA:  Initial evaluation for right branch retinal artery occlusion. EXAM: CT ANGIOGRAPHY HEAD AND NECK TECHNIQUE: Multidetector CT imaging of the head and neck was performed using the standard protocol during bolus administration of intravenous contrast. Multiplanar CT image reconstructions and MIPs were obtained to evaluate the vascular anatomy. Carotid stenosis measurements (when applicable) are obtained utilizing NASCET criteria, using the distal internal carotid diameter as the denominator. CONTRAST:  82mL OMNIPAQUE IOHEXOL 350 MG/ML SOLN COMPARISON:  Prior CT from 12/23/2018. FINDINGS: CTA NECK FINDINGS Aortic arch: Visualized aortic arch of normal caliber with normal 3 vessel morphology. Moderate atherosclerotic change about the arch and origin of the great vessels. Associated stenosis of up to approximately 40% at the origin of the left subclavian artery. No other hemodynamically significant stenosis about the origin the great vessels. Visualized subclavian arteries otherwise patent. Right carotid system: Right CCA patent from its origin to the bifurcation without stenosis. Bulky calcified plaque about the right bifurcation/proximal right ICA with associated short-segment stenosis of up to 80% by NASCET criteria. Right ICA widely patent distally to the skull base without stenosis, dissection, or occlusion. Left carotid system: Left CCA patent from its origin to the bifurcation without stenosis. Scattered calcified plaque about the left bifurcation with associated stenosis of up to 40% by NASCET criteria. Left ICA otherwise patent to the skull base without stenosis, dissection, or occlusion. Vertebral arteries: Both vertebral arteries arise from the subclavian arteries. Left vertebral artery dominant. Atheromatous plaque about the origin of the vertebral arteries  bilaterally without significant stenosis on the left. There is severe at least 80% stenosis at the origin of the right vertebral artery. Vertebral arteries otherwise widely patent without stenosis, dissection, or occlusion. Skeleton: No acute osseous finding. No discrete lytic or blastic osseous lesions. Moderate cervical spondylosis at C3-4 through C6-7. Other neck: No other acute soft tissue abnormality within the neck. No mass lesion or adenopathy. Upper chest: Visualized upper chest demonstrates no acute finding. Review of the MIP images confirms the above findings CTA HEAD FINDINGS Anterior circulation: Petrous segments widely patent bilaterally. Multifocal atheromatous plaque throughout the carotid siphons with associated moderate multifocal narrowing. ICA termini well perfused. A1 segments patent bilaterally. Normal anterior communicating artery complex. Anterior cerebral arteries widely patent to their distal aspects without stenosis. No M1 stenosis or occlusion. Negative MCA bifurcations. Distal MCA branches well perfused and symmetric. Distal small vessel atheromatous irregularity noted. Posterior circulation: Dominant left vertebral artery patent to the vertebrobasilar junction without hemodynamically significant stenosis. Patent left PICA. Short-segment severe proximal right V4 stenosis noted (series 6, image 343). Right V4 segment otherwise patent. Right PICA not visualized. Basilar patent to its distal aspect without stenosis. Superior cerebral arteries patent proximally. Both posterior cerebral arteries primarily supplied via the basilar and are well perfused to their distal aspects. Venous sinuses: Grossly patent allowing for timing the contrast bolus. Anatomic variants: None significant. Review of the MIP images confirms the above findings IMPRESSION: 1. Negative CTA for large  vessel occlusion. 2. Severe short-segment 80% atheromatous stenosis about the right bifurcation/proximal right ICA. 3. 40%  atheromatous stenosis about the left bifurcation/proximal left ICA. 4. Short-segment severe stenoses at the origin of the right vertebral as well as at the right V4 segment. Left vertebral artery dominant without hemodynamically significant stenosis. 5. Moderate atherosclerotic change about the aortic arch and carotid siphons without high-grade stenosis. Electronically Signed   By: Rise MuBenjamin  McClintock M.D.   On: 12/24/2018 04:35    PHYSICAL EXAM  Temp:  [98.3 F (36.8 C)-98.5 F (36.9 C)] 98.3 F (36.8 C) (10/21 1502) Pulse Rate:  [59-78] 69 (10/21 1502) Resp:  [11-20] 20 (10/20 2337) BP: (111-135)/(65-82) 124/66 (10/21 1502) SpO2:  [94 %-100 %] 100 % (10/21 1502) Weight:  [84.9 kg] 84.9 kg (10/20 2337)  General - Well nourished, well developed, in no apparent distress.  Ophthalmologic - fundi not visualized due to noncooperation.  Cardiovascular - Regular rhythm and rate.  Mental Status -  Level of arousal and orientation to time, place, and person were intact. Language including expression, naming, repetition, comprehension was assessed and found intact. Attention span and concentration were normal. Fund of Knowledge was assessed and was intact.  Cranial Nerves II - XII - II - Visual field intact OS. Small inferior nasal part visual field loss OD. III, IV, VI - Extraocular movements intact. V - Facial sensation intact bilaterally. VII - Facial movement intact bilaterally. VIII - Hearing & vestibular intact bilaterally. X - Palate elevates symmetrically. XI - Chin turning & shoulder shrug intact bilaterally. XII - Tongue protrusion intact.  Motor Strength - The patient's strength was normal in all extremities and pronator drift was absent.  Bulk was normal and fasciculations were absent.   Motor Tone - Muscle tone was assessed at the neck and appendages and was normal.  Reflexes - The patient's reflexes were symmetrical in all extremities and she had no pathological  reflexes.  Sensory - Light touch, temperature/pinprick were assessed and were symmetrical.    Coordination - The patient had normal movements in the hands and feet with no ataxia or dysmetria.  Tremor was absent.  Gait and Station - deferred.   ASSESSMENT/PLAN Judy Carlson is a 68 y.o. 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 She also had a 4-5 day hx of nausea, R arm weakness, and R hand numbness.  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   CTA head & neck no ELVO. Proximal R ICA 80% stenosis. Proximal L ICA 40% stenosis. Dominant L VA origin stenosis. Moderate atherosclerosis aortic arch and ICA siphons    MRI  pending  2D Echo pending  LDL 160  HgbA1c 5.8  Lovenox 40 mg sq daily for VTE prophylaxis  No antithrombotic prior to admission, now on aspirin 325 mg daily and clopidogrel 75 mg daily. Continue on discharge. Further antiplatelet regimen per VVS  Therapy recommendations:  pending   Disposition:  pending   Carotid Stenosis  CTA neck Proximal R ICA 80% stenosis. Proximal L ICA 40% stenosis.   Likely the cause of right BRAO  SBP goal 130-150 before right CEA   VVS consulted, Dr. Arbie CookeyEarly plan for right CEA in 2 weeks  Hypertension  Stable . Permissive hypertension for 24-48 hours (OK if < 220/120) but gradually normalize in 3-5 days .  Long-term BP goal normotensive  Hyperlipidemia  Home meds:  No statin (intolerant), on fish oil  Now on zetia 10 mg daily  LDL 160, goal < 70  Continue zetia, fish oil at discharge  COVID infection  Positive test on 11/06/2018  Currently asymptomatic  Repeat test positive still on 12/23/2018  Management as per primary team  Other Stroke Risk Factors  Advanced age  Former Cigarette smoker  ETOH use, alcohol level  advised to drink no more than 1 drink(s) a day  Obesity, Body mass index is 31.15 kg/m., recommend weight loss, diet and exercise as appropriate   Hospital day # 1  Marvel Plan, MD PhD Stroke Neurology 12/24/2018 5:09 PM    To contact Stroke Continuity provider, please refer to [email or web address]. After hours, contact General Neurology

## 2018-12-24 NOTE — Progress Notes (Signed)
SLP Cancellation Note  Patient Details Name: Judy Carlson MRN: [id number] DOB: 20-Dec-1950   Cancelled treatment:       Reason Eval/Treat Not Completed: SLP screened, no needs identified, will sign off. GIven MD notes, no concern for aphasia, dysarthria, or cognitive disorders will defer eval.    [name], Katherene Ponto 12/24/2018, 12:35 PM

## 2018-12-24 NOTE — Progress Notes (Signed)
  Echocardiogram 2D Echocardiogram limited has been performed.  Judy Carlson M 12/24/2018, 10:50 AM

## 2018-12-24 NOTE — Progress Notes (Signed)
PT Cancellation Note  Patient Details Name: MAHOGANIE BASHER MRN: [id number] DOB: 11/16/1950   Cancelled Treatment: Reason Eval/Treat Not Completed: PT screened. Per OT, pt has no acute PT needs identified, will sign off. Please reconsult if new needs arise.  Mabeline Caras, PT, DPT Acute Rehabilitation Services  Pager [phone] Office [place] 12/24/2018, 12:41 PM

## 2018-12-24 NOTE — Progress Notes (Addendum)
Patient ID: Judy Carlson, female   DOB: October 06, 1950, 68 y.o.   MRN: [id number]  PROGRESS NOTE    Judy Carlson  UUV:[id number] DOB: [birth year] DOA: 12/23/2018 PCP: Tresa Garter, MD   Brief Narrative:  68 year old female with history of hypertension, hyperlipidemia, recent COVID-19 infection beginning of September 2020 presented on 12/23/2018 with right-sided weakness as well as right visual changes.  Initial CT of the head was unremarkable.  Neurology was consulted.  Assessment & Plan:  Right-sided weakness and visual change: Suspect TIA versus CVA -Initial CT of the head was unremarkable.  Neurology has been consulted.  MRI of the brain is pending. -CT angiogram of the head and neck showed negative CTA for large vessel occlusion.  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  I have consulted vascular surgery/Dr. Early on phone today as per neurology/Dr. Xu's recommendations -Follow 2D echo.  LDL 160.  Currently on ezetimibe.  Apparently, patient cannot tolerate statin because of myalgias.  Hemoglobin A1c 5.8 -Follow PT/OT/SLP evaluation and recommendations -Continue aspirin and Plavix  Essential hypertension -Blood pressure stable.  Allow for permissive hypertension.  Hold metoprolol and amlodipine  Hyperlipidemia--plan as above  Recent Covid 19 positive -Patient had symptoms in late August/early September 2020 and was positive on 11/07/2018. -Repeat testing done on 12/23/2018 still positive.  Patient currently has no symptoms and respiratory status is stable.    DVT prophylaxis: Lovenox Code Status: Full Family Communication: None at bedside Disposition Plan: Home once cleared by neurology and vascular surgery  Consultants: Neurology/vascular surgery  Procedures: None  Antimicrobials: None   Subjective: Patient seen and examined at  bedside.  She feels that her right-sided weakness and vision changes are improving.  Complains of intermittent headache.  No overnight fever or vomiting.  Objective: Vitals:   12/23/18 2230 12/23/18 2300 12/23/18 2337 12/24/18 0947  BP: 130/80 123/71 135/75 128/82  Pulse: 65 63 63 78  Resp:   20   Temp:   98.5 F (36.9 C) 98.3 F (36.8 C)  TempSrc:   Oral Oral  SpO2: 96% 96% 96% 99%  Weight:   84.9 kg   Height:   5\' 5"  (1.651 m)     Intake/Output Summary (Last 24 hours) at 12/24/2018 1059 Last data filed at 12/24/2018 0603 Gross per 24 hour  Intake 240 ml  Output --  Net 240 ml   Filed Weights   12/23/18 2337  Weight: 84.9 kg    Examination:  General exam: Appears calm and comfortable  Respiratory system: Bilateral decreased breath sounds at bases Cardiovascular system: S1 & S2 heard, Rate controlled Gastrointestinal system: Abdomen is nondistended, soft and nontender. Normal bowel sounds heard. Extremities: No cyanosis, clubbing, edema  Central nervous system: Alert and oriented. No focal neurological deficits. Moving extremities   Data Reviewed: I have personally reviewed following labs and imaging studies  CBC: Recent Labs  Lab 12/23/18 1317 12/23/18 1802  WBC 6.5 5.6  NEUTROABS 3.8  --   HGB 13.9 13.7  HCT 43.1 41.9  MCV 87.1 86.2  PLT 356 313   Basic Metabolic Panel: Recent Labs  Lab 12/23/18 1317 12/23/18 1802  NA 141  --   K 4.1  --   CL 109  --   CO2 21*  --   GLUCOSE 115*  --   BUN 16  --   CREATININE 0.90 0.92  CALCIUM 10.1  --  GFR: Estimated Creatinine Clearance: 63 mL/min (by C-G formula based on SCr of 0.92 mg/dL). Liver Function Tests: Recent Labs  Lab 12/23/18 1317  AST 18  ALT 22  ALKPHOS 80  BILITOT 0.7  PROT 7.5  ALBUMIN 4.0   No results for input(s): LIPASE, AMYLASE in the last 168 hours. No results for input(s): AMMONIA in the last 168 hours. Coagulation Profile: Recent Labs  Lab 12/23/18 1317  INR 0.9    Cardiac Enzymes: No results for input(s): CKTOTAL, CKMB, CKMBINDEX, TROPONINI in the last 168 hours. BNP (last 3 results) No results for input(s): PROBNP in the last 8760 hours. HbA1C: Recent Labs    12/24/18 0458  HGBA1C 5.8*   CBG: No results for input(s): GLUCAP in the last 168 hours. Lipid Profile: Recent Labs    12/24/18 0458  CHOL 242*  HDL 62  LDLCALC 160*  TRIG 101  CHOLHDL 3.9   Thyroid Function Tests: No results for input(s): TSH, T4TOTAL, FREET4, T3FREE, THYROIDAB in the last 72 hours. Anemia Panel: No results for input(s): VITAMINB12, FOLATE, FERRITIN, TIBC, IRON, RETICCTPCT in the last 72 hours. Sepsis Labs: No results for input(s): PROCALCITON, LATICACIDVEN in the last 168 hours.  Recent Results (from the past 240 hour(s))  SARS Coronavirus 2 by RT PCR (hospital order, performed in [facility] hospital lab) Nasopharyngeal Nasopharyngeal Swab     Status: Abnormal   Collection Time: 12/23/18  6:02 PM   Specimen: Nasopharyngeal Swab  Result Value Ref Range Status   SARS Coronavirus 2 POSITIVE (A) NEGATIVE Final    Comment: RESULT CALLED TO, READ BACK BY AND VERIFIED WITH: Bennie Hind RN 1945 12/23/18 A BROWNING (NOTE) If result is NEGATIVE SARS-CoV-2 target nucleic acids are NOT DETECTED. The SARS-CoV-2 RNA is generally detectable in upper and lower  respiratory specimens during the acute phase of infection. The lowest  concentration of SARS-CoV-2 viral copies this assay can detect is 250  copies / mL. A negative result does not preclude SARS-CoV-2 infection  and should not be used as the sole basis for treatment or other  patient management decisions.  A negative result may occur with  improper specimen collection / handling, submission of specimen other  than nasopharyngeal swab, presence of viral mutation(s) within the  areas targeted by this assay, and inadequate number of viral copies  (<250 copies / mL). A negative result must be combined with  clinical  observations, patient history, and epidemiological information. If result is POSITIVE SARS-CoV-2 target nucleic acids are DETECTED.  The SARS-CoV-2 RNA is generally detectable in upper and lower  respiratory specimens during the acute phase of infection.  Positive  results are indicative of active infection with SARS-CoV-2.  Clinical  correlation with patient history and other diagnostic information is  necessary to determine patient infection status.  Positive results do  not rule out bacterial infection or co-infection with other viruses. If result is PRESUMPTIVE POSTIVE SARS-CoV-2 nucleic acids MAY BE PRESENT.   A presumptive positive result was obtained on the submitted specimen  and confirmed on repeat testing.  While 2019 novel coronavirus  (SARS-CoV-2) nucleic acids may be present in the submitted sample  additional confirmatory testing may be necessary for epidemiological  and / or clinical management purposes  to differentiate between  SARS-CoV-2 and other Sarbecovirus currently known to infect humans.  If clinically indicated additional testing with an alternate test  methodology [phone])  is advised. The SARS-CoV-2 RNA is generally  detectable in upper and lower respiratory specimens during  the acute  phase of infection. The expected result is Negative. Fact Sheet for Patients:  BoilerBrush.com.cy Fact Sheet for Healthcare Providers: https://pope.com/ This test is not yet approved or cleared by the [place] FDA and has been authorized for detection and/or diagnosis of SARS-CoV-2 by FDA under an Emergency Use Authorization (EUA).  This EUA will remain in effect (meaning this test can be used) for the duration of the COVID-19 declaration under Section 564(b)(1) of the Act, 21 U.S.C. section 360bbb-3(b)(1), unless the authorization is terminated or revoked sooner. Performed at [facility], [address], [place], [place] [address]          Radiology Studies: Ct Angio Head W Or Wo Contrast  Result Date: 12/24/2018 CLINICAL DATA:  Initial evaluation for right branch retinal artery occlusion. EXAM: CT ANGIOGRAPHY HEAD AND NECK TECHNIQUE: Multidetector CT imaging of the head and neck was performed using the standard protocol during bolus administration of intravenous contrast. Multiplanar CT image reconstructions and MIPs were obtained to evaluate the vascular anatomy. Carotid stenosis measurements (when applicable) are obtained utilizing NASCET criteria, using the distal internal carotid diameter as the denominator. CONTRAST:  75mL OMNIPAQUE IOHEXOL 350 MG/ML SOLN COMPARISON:  Prior CT from 12/23/2018. FINDINGS: CTA NECK FINDINGS Aortic arch: Visualized aortic arch of normal caliber with normal 3 vessel morphology. Moderate atherosclerotic change about the arch and origin of the great vessels. Associated stenosis of up to approximately 40% at the origin of the left subclavian artery. No other hemodynamically significant stenosis about the origin the great vessels. Visualized subclavian arteries otherwise patent. Right carotid system: Right CCA patent from its origin to the bifurcation without stenosis. Bulky calcified plaque about the right bifurcation/proximal right ICA with associated short-segment stenosis of up to 80% by NASCET criteria. Right ICA widely patent distally to the skull base without stenosis, dissection, or occlusion. Left carotid system: Left CCA patent from its origin to the bifurcation without stenosis. Scattered calcified plaque about the left bifurcation with associated stenosis of up to 40% by NASCET criteria. Left ICA otherwise patent to the skull base without stenosis, dissection, or occlusion. Vertebral arteries: Both vertebral arteries arise from the subclavian arteries. Left vertebral artery dominant. Atheromatous plaque about the origin of the vertebral arteries bilaterally  without significant stenosis on the left. There is severe at least 80% stenosis at the origin of the right vertebral artery. Vertebral arteries otherwise widely patent without stenosis, dissection, or occlusion. Skeleton: No acute osseous finding. No discrete lytic or blastic osseous lesions. Moderate cervical spondylosis at C3-4 through C6-7. Other neck: No other acute soft tissue abnormality within the neck. No mass lesion or adenopathy. Upper chest: Visualized upper chest demonstrates no acute finding. Review of the MIP images confirms the above findings CTA HEAD FINDINGS Anterior circulation: Petrous segments widely patent bilaterally. Multifocal atheromatous plaque throughout the carotid siphons with associated moderate multifocal narrowing. ICA termini well perfused. A1 segments patent bilaterally. Normal anterior communicating artery complex. Anterior cerebral arteries widely patent to their distal aspects without stenosis. No M1 stenosis or occlusion. Negative MCA bifurcations. Distal MCA branches well perfused and symmetric. Distal small vessel atheromatous irregularity noted. Posterior circulation: Dominant left vertebral artery patent to the vertebrobasilar junction without hemodynamically significant stenosis. Patent left PICA. Short-segment severe proximal right V4 stenosis noted (series 6, image 343). Right V4 segment otherwise patent. Right PICA not visualized. Basilar patent to its distal aspect without stenosis. Superior cerebral arteries patent proximally. Both posterior cerebral arteries primarily supplied via the basilar  and are well perfused to their distal aspects. Venous sinuses: Grossly patent allowing for timing the contrast bolus. Anatomic variants: None significant. Review of the MIP images confirms the above findings IMPRESSION: 1. Negative CTA for large vessel occlusion. 2. Severe short-segment 80% atheromatous stenosis about the right bifurcation/proximal right ICA. 3. 40% atheromatous  stenosis about the left bifurcation/proximal left ICA. 4. Short-segment severe stenoses at the origin of the right vertebral as well as at the right V4 segment. Left vertebral artery dominant without hemodynamically significant stenosis. 5. Moderate atherosclerotic change about the aortic arch and carotid siphons without high-grade stenosis. Electronically Signed   By: Rise Mu M.D.   On: 12/24/2018 04:35   Dg Chest 2 View  Result Date: 12/23/2018 CLINICAL DATA:  Generalize weakness since Saturday. EXAM: CHEST - 2 VIEW COMPARISON:  Chest x-ray 10/28/2017 FINDINGS: The cardiac silhouette, mediastinal and hilar contours are within normal limits and stable. Mild tortuosity and minimal calcification of the thoracic aorta. The lungs are clear. No infiltrates or effusions. No pulmonary edema. No worrisome pulmonary lesions. The bony thorax is intact. IMPRESSION: No acute cardiopulmonary findings. Electronically Signed   By: Rudie Meyer M.D.   On: 12/23/2018 18:00   Ct Head Wo Contrast  Result Date: 12/23/2018 CLINICAL DATA:  Partial blockage of RIGHT eye, possible stroke evaluation and temporal artery occlusion, focal neural deficit of greater than 6 hours, history of hypertension, former smoker EXAM: CT HEAD WITHOUT CONTRAST TECHNIQUE: Contiguous axial images were obtained from the base of the skull through the vertex without intravenous contrast. Sagittal and coronal MPR images reconstructed from axial data set. COMPARISON:  05/14/2013 FINDINGS: Brain: Normal ventricular morphology. No midline shift or mass effect. Normal appearance of brain parenchyma. No intracranial hemorrhage, mass lesion or evidence of acute infarction. No extra-axial fluid collections. Vascular: Mild atherosclerotic calcifications at internal carotid arteries bilaterally at skull base. Skull: Intact Sinuses/Orbits: No acute findings Other: N/A IMPRESSION: No acute intracranial abnormalities. Electronically Signed   By: Ulyses Southward M.D.   On: 12/23/2018 13:51   Ct Angio Neck W Or Wo Contrast  Result Date: 12/24/2018 CLINICAL DATA:  Initial evaluation for right branch retinal artery occlusion. EXAM: CT ANGIOGRAPHY HEAD AND NECK TECHNIQUE: Multidetector CT imaging of the head and neck was performed using the standard protocol during bolus administration of intravenous contrast. Multiplanar CT image reconstructions and MIPs were obtained to evaluate the vascular anatomy. Carotid stenosis measurements (when applicable) are obtained utilizing NASCET criteria, using the distal internal carotid diameter as the denominator. CONTRAST:  75mL OMNIPAQUE IOHEXOL 350 MG/ML SOLN COMPARISON:  Prior CT from 12/23/2018. FINDINGS: CTA NECK FINDINGS Aortic arch: Visualized aortic arch of normal caliber with normal 3 vessel morphology. Moderate atherosclerotic change about the arch and origin of the great vessels. Associated stenosis of up to approximately 40% at the origin of the left subclavian artery. No other hemodynamically significant stenosis about the origin the great vessels. Visualized subclavian arteries otherwise patent. Right carotid system: Right CCA patent from its origin to the bifurcation without stenosis. Bulky calcified plaque about the right bifurcation/proximal right ICA with associated short-segment stenosis of up to 80% by NASCET criteria. Right ICA widely patent distally to the skull base without stenosis, dissection, or occlusion. Left carotid system: Left CCA patent from its origin to the bifurcation without stenosis. Scattered calcified plaque about the left bifurcation with associated stenosis of up to 40% by NASCET criteria. Left ICA otherwise patent to the skull base without stenosis, dissection, or occlusion. Vertebral  arteries: Both vertebral arteries arise from the subclavian arteries. Left vertebral artery dominant. Atheromatous plaque about the origin of the vertebral arteries bilaterally without significant stenosis  on the left. There is severe at least 80% stenosis at the origin of the right vertebral artery. Vertebral arteries otherwise widely patent without stenosis, dissection, or occlusion. Skeleton: No acute osseous finding. No discrete lytic or blastic osseous lesions. Moderate cervical spondylosis at C3-4 through C6-7. Other neck: No other acute soft tissue abnormality within the neck. No mass lesion or adenopathy. Upper chest: Visualized upper chest demonstrates no acute finding. Review of the MIP images confirms the above findings CTA HEAD FINDINGS Anterior circulation: Petrous segments widely patent bilaterally. Multifocal atheromatous plaque throughout the carotid siphons with associated moderate multifocal narrowing. ICA termini well perfused. A1 segments patent bilaterally. Normal anterior communicating artery complex. Anterior cerebral arteries widely patent to their distal aspects without stenosis. No M1 stenosis or occlusion. Negative MCA bifurcations. Distal MCA branches well perfused and symmetric. Distal small vessel atheromatous irregularity noted. Posterior circulation: Dominant left vertebral artery patent to the vertebrobasilar junction without hemodynamically significant stenosis. Patent left PICA. Short-segment severe proximal right V4 stenosis noted (series 6, image 343). Right V4 segment otherwise patent. Right PICA not visualized. Basilar patent to its distal aspect without stenosis. Superior cerebral arteries patent proximally. Both posterior cerebral arteries primarily supplied via the basilar and are well perfused to their distal aspects. Venous sinuses: Grossly patent allowing for timing the contrast bolus. Anatomic variants: None significant. Review of the MIP images confirms the above findings IMPRESSION: 1. Negative CTA for large vessel occlusion. 2. Severe short-segment 80% atheromatous stenosis about the right bifurcation/proximal right ICA. 3. 40% atheromatous stenosis about the left  bifurcation/proximal left ICA. 4. Short-segment severe stenoses at the origin of the right vertebral as well as at the right V4 segment. Left vertebral artery dominant without hemodynamically significant stenosis. 5. Moderate atherosclerotic change about the aortic arch and carotid siphons without high-grade stenosis. Electronically Signed   By: Rise MuBenjamin  McClintock M.D.   On: 12/24/2018 04:35        Scheduled Meds:   stroke: mapping our early stages of recovery book   Does not apply Once   aspirin EC  325 mg Oral Daily   cholecalciferol  2,000 Units Oral Daily   clopidogrel  75 mg Oral Daily   enoxaparin (LOVENOX) injection  40 mg Subcutaneous Q24H   ezetimibe  10 mg Oral Daily   omega-3 acid ethyl esters  1 g Oral Daily   Continuous Infusions:        Glade LloydKshitiz Marvelous [place], MD [facility] 12/24/2018, 10:59 AM

## 2018-12-24 NOTE — Progress Notes (Signed)
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   Clinical Impression   PTA, pt independent with ADL and mobility. Pt states she has felt generally weak since Covid, but has been functioning without difficulty. Pt states she had difficulty "getting her words out, was drooling out of the R side of her mouth, had tingling and more difficulty using her R hand", in addition to her visual changes. States her other symptoms have improved. PT independent with mobility and ADL. Pt continues to have a visual field deficit with R eye, but visual deficit does not appear to affect her ability to function safely when using binocular vision. Reviewed signs and symptoms of a CVA using [employer]. Pt verbalized understanding. No further OT needed at this time. OT signing off.     Follow Up Recommendations  Other (comment)(will follow up with opthalmologist to discuss driving)    Equipment Recommendations  None recommended by OT    Recommendations for Other Services       Precautions / Restrictions Precautions Precautions: Fall      Mobility Bed Mobility Overal bed mobility: Independent                Transfers Overall transfer level: Independent                    Balance Overall balance assessment: No apparent balance deficits (not formally assessed)                                         ADL either performed or assessed with clinical judgement   ADL Overall ADL's : At baseline                                              Vision Baseline Vision/History: Wears glasses Wears Glasses: At all times Patient Visual Report: Central vision impairment Vision Assessment?: Yes Eye Alignment: Within Functional Limits Ocular Range of Motion: Within Functional Limits Alignment/Gaze Preference: Within Defined Limits Tracking/Visual Pursuits: Able to track stimulus in all quads without difficulty Saccades: Within functional limits Convergence: Within functional limits Visual Fields: Right visual field deficit Additional Comments: Pt complains of R mid/inferior quadrant deficit  - when R eye closed, pt states she sees "blanks around therapist's mouth/nose area but is able to see above adn below athat area. Staes this has improved since yesterday. Appeasr L eye dominanat; past surgery on R eye and states her L eye is her "better" eye; able to read without difficulty     Perception     Praxis Praxis Praxis tested?: Within functional limits    Pertinent Vitals/Pain Pain Assessment: No/denies pain     Hand Dominance Right   Extremity/Trunk Assessment Upper Extremity Assessment Upper Extremity Assessment: RUE deficits/detail RUE Deficits / Details: tingling R UE   Lower Extremity Assessment Lower Extremity Assessment: Overall WFL for tasks assessed   Cervical / Trunk Assessment Cervical / Trunk  Assessment: Normal   Communication Communication Communication: No difficulties   Cognition Arousal/Alertness: Awake/alert Behavior During Therapy: WFL for tasks assessed/performed Overall Cognitive Status: Within Functional Limits for tasks assessed                                     General Comments       Exercises     Shoulder Instructions      Home Living Family/patient expects to be discharged to:: Private residence Living Arrangements: Spouse/significant other Available Help at Discharge: Family;Available 24 hours/day Type of Home: House Home Access:  Stairs to enter [employer] of Steps: 3 Entrance Stairs-Rails: None Home Layout: One level     Bathroom Shower/Tub: Tub/shower unit;Walk-in shower   Bathroom Toilet: Standard Bathroom Accessibility: Yes How Accessible: Accessible via walker Home Equipment: [place] - 2 wheels;Bedside commode(belong to husband)          Prior Functioning/Environment Level of Independence: Independent        Comments: drives; manages medication/finances; recently stopped working for [employer]        OT Problem List: Impaired vision/perception      OT Treatment/Interventions:      OT Goals(Current goals can be found in the care plan section) Acute Rehab OT Goals Patient Stated Goal: to get better OT Goal Formulation: All assessment and education complete, DC therapy  OT Frequency:     Barriers to D/C:            Co-evaluation              AM-PAC OT "6 Clicks" Daily Activity     Outcome Measure Help from another person eating meals?: None Help from another person taking care of personal grooming?: None Help from another person toileting, which includes using toliet, bedpan, or urinal?: None Help from another person bathing (including washing, rinsing, drying)?: None Help from another person to put on and taking off regular upper body clothing?: None Help from another person to put on and taking off regular lower body clothing?: None 6 Click Score: 24   End of Session Nurse Communication: Mobility status;Other (comment)(DC needs)  Activity Tolerance: Patient tolerated treatment well Patient left: in bed;with call bell/phone within reach  OT Visit Diagnosis: Low vision, both eyes (H54.2)                Time: 5852-7782 OT Time Calculation (min): 28 min Charges:  OT General Charges $OT Visit: 1 Visit OT Evaluation $OT Eval Moderate Complexity: 1 Mod OT Treatments $Self Care/Home Management : 8-22 mins  Maurie Boettcher, OT/L   Acute OT Clinical  Specialist [place] Pager [phone] Office [phone]   [facility] 12/24/2018, 1:01 PM
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
## 2018-12-25 ENCOUNTER — Encounter (HOSPITAL_COMMUNITY): Payer: Self-pay

## 2018-12-25 DIAGNOSIS — H34231 Retinal artery branch occlusion, right eye: Secondary | ICD-10-CM

## 2018-12-25 DIAGNOSIS — I6521 Occlusion and stenosis of right carotid artery: Secondary | ICD-10-CM

## 2018-12-25 LAB — BASIC METABOLIC PANEL
Anion gap: 10 (ref 5–15)
BUN: 17 mg/dL (ref 8–23)
CO2: 20 mmol/L — ABNORMAL LOW (ref 22–32)
Calcium: 8.9 mg/dL (ref 8.9–10.3)
Chloride: 109 mmol/L (ref 98–111)
Creatinine, Ser: 0.96 mg/dL (ref 0.44–1.00)
GFR calc Af Amer: >60 mL/min (ref >60)
GFR calc non Af Amer: >60 mL/min (ref >60)
Glucose, Bld: 91 mg/dL (ref 70–99)
Potassium: 3.9 mmol/L (ref 3.5–5.1)
Sodium: 139 mmol/L (ref 135–145)

## 2018-12-25 LAB — CBC WITH DIFFERENTIAL/PLATELET
Abs Immature Granulocytes: 0.01 10*3/uL (ref 0.00–0.07)
Basophils Absolute: 0 10*3/uL (ref 0.0–0.1)
Basophils Relative: 0 %
Eosinophils Absolute: 0.1 10*3/uL (ref 0.0–0.5)
Eosinophils Relative: 2 %
HCT: 34.7 % — ABNORMAL LOW (ref 36.0–46.0)
Hemoglobin: 11.5 g/dL — ABNORMAL LOW (ref 12.0–15.0)
Immature Granulocytes: 0 %
Lymphocytes Relative: 43 %
Lymphs Abs: 2 10*3/uL (ref 0.7–4.0)
MCH: 28.4 pg (ref 26.0–34.0)
MCHC: 33.1 g/dL (ref 30.0–36.0)
MCV: 85.7 fL (ref 80.0–100.0)
Monocytes Absolute: 0.7 10*3/uL (ref 0.1–1.0)
Monocytes Relative: 14 %
Neutro Abs: 2 10*3/uL (ref 1.7–7.7)
Neutrophils Relative %: 41 %
Platelets: 306 10*3/uL (ref 150–400)
RBC: 4.05 MIL/uL (ref 3.87–5.11)
RDW: 15.6 % — ABNORMAL HIGH (ref 11.5–15.5)
WBC: 4.9 10*3/uL (ref 4.0–10.5)
nRBC: 0 % (ref 0.0–0.2)

## 2018-12-25 LAB — MAGNESIUM: Magnesium: 2 mg/dL (ref 1.7–2.4)

## 2018-12-25 MED ORDER — ASPIRIN 325 MG PO TBEC: 325.0000 mg | DELAYED_RELEASE_TABLET | Freq: Every day | ORAL | 0 refills | Status: DC

## 2018-12-25 MED ORDER — CLOPIDOGREL BISULFATE 75 MG PO TABS: 75.0000 mg | ORAL_TABLET | Freq: Every day | ORAL | 0 refills | Status: DC

## 2018-12-25 MED ORDER — EZETIMIBE 10 MG PO TABS: 10.0000 mg | ORAL_TABLET | Freq: Every day | ORAL | 0 refills | Status: DC

## 2018-12-25 NOTE — Plan of Care (Signed)
Overnight no acute event documented. Lab and vitals stable on review. Pt MRI brain showed no acute findings. TTE EF 60-65%. Vascular surgery Dr. Donnetta Hutching has seen patient and agree with right CEA in 2 weeks. He will arrange for the procedure. Pt is on DAPT, please continue on discharge. Further antiplatelet regimen post CEA will be decided by Dr. Donnetta Hutching. Continue zetia for HLD given her multiple intolerance to variable statins. She will need to follow up with PCP to consider PCSK9 inhibitors as outpt. Avoid low BP and BP goal 120-150 before right CEA. She can be discharged from neuro standpoint.   Neurology will sign off. Please call with questions. Pt will follow up with stroke clinic NP at [facility] in about 4 weeks. Thanks for the consult.  [name], MD PhD Stroke Neurology 12/25/2018 8:10 AM

## 2018-12-25 NOTE — Discharge Summary (Signed)
Physician Discharge Summary  Judy Carlson XLK:[id number] DOB: 05-23-1950 DOA: 12/23/2018  PCP: Tresa Garter, MD  Admit date: 12/23/2018 Discharge date: 12/25/2018  Admitted From: Home Disposition: Home  Recommendations for Outpatient Follow-up:  1. Follow up with PCP in 1 week with repeat CBC/BMP 2. Outpatient follow-up with neurology and vascular surgery.  Might benefit from outpatient pulmonology evaluation also. 3. Follow up in ED if symptoms worsen or new appear   Home Health: No Equipment/Devices: None  Discharge Condition: Stable CODE STATUS: Full Diet recommendation: Heart healthy  Brief/Interim Summary: 68 year old female with history of hypertension, hyperlipidemia, recent COVID-19 infection beginning of September 2020 presented on 12/23/2018 with right-sided weakness as well as right visual changes.  Initial CT of the head was unremarkable.  Neurology was consulted.  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  Vascular surgery was consulted and recommended outpatient follow-up for CEA.  MRI of the brain was negative for acute stroke.  Symptoms have improved.  Neurology recommended outpatient follow-up and to continue aspirin, Plavix and his ezetimibe.  Discharge patient home today.  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  Neurology has been consulted.  MRI of was negative for acute infarct. -CT angiogram of the head and neck showed negative CTA for large vessel occlusion.  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 Vascular surgery was consulted and recommended outpatient follow-up for CEA.   -LDL 160.  Currently on ezetimibe which will be continued on discharge.  Apparently, patient cannot tolerate statin because of myalgias.  Hemoglobin A1c 5.8 -Follow PT/OT/SLP evaluation and recommendations -Continue aspirin and Plavix on discharge.  Further antiplatelet regimen post CEA will be decided by Dr. Arbie Cookey. -Consider ophthalmology evaluation as an outpatient.  Essential hypertension -Blood pressure stable.    Antihypertensives on hold for now.  Will resume metoprolol on discharge but keep holding amlodipine till evaluation by PCP.  Neurology recommends to avoid low BP and BP goal of 120-1 50 before right CEA.  Hyperlipidemia--plan as above  Recent Covid 19 positive -Patient had symptoms in late August/early September 2020 and was positive on 11/07/2018. -Repeat testing done on 12/23/2018 still positive.  Patient currently has no symptoms and respiratory status is stable.   Discharge Instructions  Discharge Instructions    Ambulatory referral to Neurology   Complete by: As directed    Follow up with stroke clinic NP (Jessica Vanschaick or Darrol Angel, if both not available, consider Manson Allan, or Ahern) at [facility] in about 4 weeks. Thanks.   Diet - low sodium heart healthy   Complete by: As directed    Increase activity slowly   Complete by: As directed      Allergies as of 12/25/2018      Reactions   Crestor [rosuvastatin Calcium] Other (See Comments)   myalgias   Doxycycline Other (See Comments)   Doesn't remember    Lipitor [atorvastatin]    Myalgias/feet pain   Lisinopril Swelling   Tongue   Lovastatin Hives   rash   Naproxen Other (See Comments)   Doesn't remember    Pravastatin Other (See Comments)   Myalgias.   Prednisone Itching, Swelling, Other (See Comments)   Toes numb, itching  and swelling on inside of mouth   Clindamycin/lincomycin Rash      Medication List    STOP taking these medications   amLODipine 10 MG tablet Commonly known as:  NORVASC     TAKE these medications   acetaminophen 650 MG CR tablet Commonly known as: TYLENOL Take 1,300 mg by mouth every 8 (eight) hours as needed for pain.   aspirin 325 MG EC tablet Take 1 tablet (325 mg total) by mouth daily. Start taking on: December 26, 2018   clopidogrel 75 MG tablet Commonly known as: PLAVIX Take 1 tablet (75 mg total) by mouth daily. Start taking on: December 26, 2018   ezetimibe 10 MG tablet Commonly known as: ZETIA Take 1 tablet (10 mg total) by mouth daily. Start taking on: December 26, 2018   FISH OIL PO Take 1 capsule by mouth daily.   furosemide 40 MG tablet Commonly known as: LASIX Take 1 tablet (40 mg total) by mouth daily as needed.   IRON PO Take 1 tablet by mouth daily.   loratadine 10 MG tablet Commonly known as: CLARITIN Take 10 mg by mouth daily.   meloxicam 7.5 MG tablet Commonly known as: MOBIC Take 7.5 mg by mouth as needed for pain.   metoprolol succinate 100 MG 24 hr tablet Commonly known as: TOPROL-XL TAKE ONE TABLET BY MOUTH ONCE DAILY WITH OR IMMEDIATELY FOLLOWING A MEAL. What changed:   how much to take  how to take this  when to take this  additional instructions   POTASSIUM PO Take 1 tablet by mouth as needed. Takes it with furosemide   Vitamin D 50 MCG (2000 UT) Caps Take 2,000 Units by mouth daily.      Follow-up Information    [facility]. Schedule an appointment as soon as possible for a visit in 4 week(s).   Specialty: Neurology Contact information: [address] [place] [address] [phone]       Plotnikov, Georgina Quint, MD. Schedule an appointment as soon as possible for a visit in 1 week(s).   Specialty: Internal Medicine Contact information: [address] [place] [address] [phone]        Larina Earthly, MD. Schedule an appointment as soon as possible for a visit in 1 week(s).   Specialties: Vascular Surgery,  Cardiology Contact information: [address] [place] [address] [phone]          Allergies  Allergen Reactions  . Crestor [Rosuvastatin Calcium] Other (See Comments)    myalgias  . Doxycycline Other (See Comments)    Doesn't remember   . Lipitor [Atorvastatin]     Myalgias/feet pain  . Lisinopril Swelling    Tongue  . Lovastatin Hives    rash  . Naproxen Other (See Comments)    Doesn't remember   . Pravastatin Other (See Comments)    Myalgias.  . Prednisone Itching, Swelling and Other (See Comments)    Toes numb, itching and swelling on inside of mouth  . Clindamycin/Lincomycin Rash    Consultations:  Neurology/vascular surgery   Procedures/Studies: Ct Angio Head W Or Wo Contrast  Result Date: 12/24/2018 CLINICAL DATA:  Initial evaluation for right branch retinal artery occlusion. EXAM: CT ANGIOGRAPHY HEAD AND NECK TECHNIQUE: Multidetector CT imaging of the head and neck was performed using the standard protocol during bolus administration of intravenous contrast. Multiplanar CT image reconstructions and MIPs were obtained to evaluate the vascular anatomy. Carotid stenosis measurements (when applicable) are  obtained utilizing NASCET criteria, using the distal internal carotid diameter as the denominator. CONTRAST:  75mL OMNIPAQUE IOHEXOL 350 MG/ML SOLN COMPARISON:  Prior CT from 12/23/2018. FINDINGS: CTA NECK FINDINGS Aortic arch: Visualized aortic arch of normal caliber with normal 3 vessel morphology. Moderate atherosclerotic change about the arch and origin of the great vessels. Associated stenosis of up to approximately 40% at the origin of the left subclavian artery. No other hemodynamically significant stenosis about the origin the great vessels. Visualized subclavian arteries otherwise patent. Right carotid system: Right CCA patent from its origin to the bifurcation without stenosis. Bulky calcified plaque about the right bifurcation/proximal right ICA with  associated short-segment stenosis of up to 80% by NASCET criteria. Right ICA widely patent distally to the skull base without stenosis, dissection, or occlusion. Left carotid system: Left CCA patent from its origin to the bifurcation without stenosis. Scattered calcified plaque about the left bifurcation with associated stenosis of up to 40% by NASCET criteria. Left ICA otherwise patent to the skull base without stenosis, dissection, or occlusion. Vertebral arteries: Both vertebral arteries arise from the subclavian arteries. Left vertebral artery dominant. Atheromatous plaque about the origin of the vertebral arteries bilaterally without significant stenosis on the left. There is severe at least 80% stenosis at the origin of the right vertebral artery. Vertebral arteries otherwise widely patent without stenosis, dissection, or occlusion. Skeleton: No acute osseous finding. No discrete lytic or blastic osseous lesions. Moderate cervical spondylosis at C3-4 through C6-7. Other neck: No other acute soft tissue abnormality within the neck. No mass lesion or adenopathy. Upper chest: Visualized upper chest demonstrates no acute finding. Review of the MIP images confirms the above findings CTA HEAD FINDINGS Anterior circulation: Petrous segments widely patent bilaterally. Multifocal atheromatous plaque throughout the carotid siphons with associated moderate multifocal narrowing. ICA termini well perfused. A1 segments patent bilaterally. Normal anterior communicating artery complex. Anterior cerebral arteries widely patent to their distal aspects without stenosis. No M1 stenosis or occlusion. Negative MCA bifurcations. Distal MCA branches well perfused and symmetric. Distal small vessel atheromatous irregularity noted. Posterior circulation: Dominant left vertebral artery patent to the vertebrobasilar junction without hemodynamically significant stenosis. Patent left PICA. Short-segment severe proximal right V4 stenosis  noted (series 6, image 343). Right V4 segment otherwise patent. Right PICA not visualized. Basilar patent to its distal aspect without stenosis. Superior cerebral arteries patent proximally. Both posterior cerebral arteries primarily supplied via the basilar and are well perfused to their distal aspects. Venous sinuses: Grossly patent allowing for timing the contrast bolus. Anatomic variants: None significant. Review of the MIP images confirms the above findings IMPRESSION: 1. Negative CTA for large vessel occlusion. 2. Severe short-segment 80% atheromatous stenosis about the right bifurcation/proximal right ICA. 3. 40% atheromatous stenosis about the left bifurcation/proximal left ICA. 4. Short-segment severe stenoses at the origin of the right vertebral as well as at the right V4 segment. Left vertebral artery dominant without hemodynamically significant stenosis. 5. Moderate atherosclerotic change about the aortic arch and carotid siphons without high-grade stenosis. Electronically Signed   By: Rise Mu M.D.   On: 12/24/2018 04:35   Dg Chest 2 View  Result Date: 12/23/2018 CLINICAL DATA:  Generalize weakness since Saturday. EXAM: CHEST - 2 VIEW COMPARISON:  Chest x-ray 10/28/2017 FINDINGS: The cardiac silhouette, mediastinal and hilar contours are within normal limits and stable. Mild tortuosity and minimal calcification of the thoracic aorta. The lungs are clear. No infiltrates or effusions. No pulmonary edema. No worrisome pulmonary lesions. The bony thorax  is intact. IMPRESSION: No acute cardiopulmonary findings. Electronically Signed   By: Rudie Meyer M.D.   On: 12/23/2018 18:00   Ct Head Wo Contrast  Result Date: 12/23/2018 CLINICAL DATA:  Partial blockage of RIGHT eye, possible stroke evaluation and temporal artery occlusion, focal neural deficit of greater than 6 hours, history of hypertension, former smoker EXAM: CT HEAD WITHOUT CONTRAST TECHNIQUE: Contiguous axial images were  obtained from the base of the skull through the vertex without intravenous contrast. Sagittal and coronal MPR images reconstructed from axial data set. COMPARISON:  05/14/2013 FINDINGS: Brain: Normal ventricular morphology. No midline shift or mass effect. Normal appearance of brain parenchyma. No intracranial hemorrhage, mass lesion or evidence of acute infarction. No extra-axial fluid collections. Vascular: Mild atherosclerotic calcifications at internal carotid arteries bilaterally at skull base. Skull: Intact Sinuses/Orbits: No acute findings Other: N/A IMPRESSION: No acute intracranial abnormalities. Electronically Signed   By: Ulyses Southward M.D.   On: 12/23/2018 13:51   Ct Angio Neck W Or Wo Contrast  Result Date: 12/24/2018 CLINICAL DATA:  Initial evaluation for right branch retinal artery occlusion. EXAM: CT ANGIOGRAPHY HEAD AND NECK TECHNIQUE: Multidetector CT imaging of the head and neck was performed using the standard protocol during bolus administration of intravenous contrast. Multiplanar CT image reconstructions and MIPs were obtained to evaluate the vascular anatomy. Carotid stenosis measurements (when applicable) are obtained utilizing NASCET criteria, using the distal internal carotid diameter as the denominator. CONTRAST:  75mL OMNIPAQUE IOHEXOL 350 MG/ML SOLN COMPARISON:  Prior CT from 12/23/2018. FINDINGS: CTA NECK FINDINGS Aortic arch: Visualized aortic arch of normal caliber with normal 3 vessel morphology. Moderate atherosclerotic change about the arch and origin of the great vessels. Associated stenosis of up to approximately 40% at the origin of the left subclavian artery. No other hemodynamically significant stenosis about the origin the great vessels. Visualized subclavian arteries otherwise patent. Right carotid system: Right CCA patent from its origin to the bifurcation without stenosis. Bulky calcified plaque about the right bifurcation/proximal right ICA with associated  short-segment stenosis of up to 80% by NASCET criteria. Right ICA widely patent distally to the skull base without stenosis, dissection, or occlusion. Left carotid system: Left CCA patent from its origin to the bifurcation without stenosis. Scattered calcified plaque about the left bifurcation with associated stenosis of up to 40% by NASCET criteria. Left ICA otherwise patent to the skull base without stenosis, dissection, or occlusion. Vertebral arteries: Both vertebral arteries arise from the subclavian arteries. Left vertebral artery dominant. Atheromatous plaque about the origin of the vertebral arteries bilaterally without significant stenosis on the left. There is severe at least 80% stenosis at the origin of the right vertebral artery. Vertebral arteries otherwise widely patent without stenosis, dissection, or occlusion. Skeleton: No acute osseous finding. No discrete lytic or blastic osseous lesions. Moderate cervical spondylosis at C3-4 through C6-7. Other neck: No other acute soft tissue abnormality within the neck. No mass lesion or adenopathy. Upper chest: Visualized upper chest demonstrates no acute finding. Review of the MIP images confirms the above findings CTA HEAD FINDINGS Anterior circulation: Petrous segments widely patent bilaterally. Multifocal atheromatous plaque throughout the carotid siphons with associated moderate multifocal narrowing. ICA termini well perfused. A1 segments patent bilaterally. Normal anterior communicating artery complex. Anterior cerebral arteries widely patent to their distal aspects without stenosis. No M1 stenosis or occlusion. Negative MCA bifurcations. Distal MCA branches well perfused and symmetric. Distal small vessel atheromatous irregularity noted. Posterior circulation: Dominant left vertebral artery patent to the vertebrobasilar  junction without hemodynamically significant stenosis. Patent left PICA. Short-segment severe proximal right V4 stenosis noted (series  6, image 343). Right V4 segment otherwise patent. Right PICA not visualized. Basilar patent to its distal aspect without stenosis. Superior cerebral arteries patent proximally. Both posterior cerebral arteries primarily supplied via the basilar and are well perfused to their distal aspects. Venous sinuses: Grossly patent allowing for timing the contrast bolus. Anatomic variants: None significant. Review of the MIP images confirms the above findings IMPRESSION: 1. Negative CTA for large vessel occlusion. 2. Severe short-segment 80% atheromatous stenosis about the right bifurcation/proximal right ICA. 3. 40% atheromatous stenosis about the left bifurcation/proximal left ICA. 4. Short-segment severe stenoses at the origin of the right vertebral as well as at the right V4 segment. Left vertebral artery dominant without hemodynamically significant stenosis. 5. Moderate atherosclerotic change about the aortic arch and carotid siphons without high-grade stenosis. Electronically Signed   By: Rise Mu M.D.   On: 12/24/2018 04:35   Mr Brain Wo Contrast  Result Date: 12/24/2018 CLINICAL DATA:  Initial evaluation for central retinal artery occlusion, stroke. EXAM: MRI HEAD WITHOUT CONTRAST TECHNIQUE: Multiplanar, multiecho pulse sequences of the brain and surrounding structures were obtained without intravenous contrast. COMPARISON:  Comparison made with prior CT and CTA from 12/23/2018 as well as earlier the same day, as well as previous MRI from 05/15/2013. FINDINGS: Brain: Cerebral volume within normal limits for patient age. No focal parenchymal signal abnormality identified. No abnormal foci of restricted diffusion to suggest acute or subacute ischemia. Gray-white matter differentiation well maintained. No encephalomalacia to suggest chronic infarction. No foci of susceptibility artifact to suggest acute or chronic intracranial hemorrhage. No mass lesion, midline shift or mass effect. No hydrocephalus. No  extra-axial fluid collection. Major dural sinuses are grossly patent. Pituitary gland and suprasellar region are normal. Midline structures intact and normal. Vascular: Major intracranial vascular flow voids well maintained and normal in appearance. Skull and upper cervical spine: Craniocervical junction normal. Visualized upper cervical spine within normal limits. Bone marrow signal intensity normal. No scalp soft tissue abnormality. Sinuses/Orbits: Globes and orbital soft tissues within normal limits. Paranasal sinuses are clear. No mastoid effusion. Inner ear structures normal. Other: None. IMPRESSION: Normal brain MRI for age. No acute intracranial abnormality identified. Electronically Signed   By: Rise Mu M.D.   On: 12/24/2018 23:37    Echo 1. Left ventricular ejection fraction, by visual estimation, is 60 to 65%. The left ventricle has normal function. There is no left ventricular hypertrophy.  2. Left ventricular diastolic function could not be evaluated pattern of LV diastolic filling.  3. Global right ventricle has normal systolic function.The right ventricular size is normal. Mildly increased right ventricular wall thickness.  4. Left atrial size was not well visualized.  5. Right atrial size was not well visualized.  6. The mitral valve is grossly normal. Trace mitral valve regurgitation.  7. The tricuspid valve is grossly normal. Tricuspid valve regurgitation is trivial.  8. The aortic valve is tricuspid Aortic valve regurgitation was not visualized by color flow Doppler. Structurally normal aortic valve, with no evidence of sclerosis or stenosis.  9. The pulmonic valve was not well visualized. Pulmonic valve regurgitation was not assessed by color flow Doppler. 10. The inferior vena cava is normal in size with greater than 50% respiratory variability, suggesting right atrial pressure of 3 mmHg.   Subjective: Patient seen and examined at bedside.  She feels much better and  wants to go home.  Denies any  worsening weakness or vision problem.  Complains of mild headache.  Discharge Exam: Vitals:   12/24/18 2329 12/25/18 0808  BP: (!) 159/90 126/67  Pulse: 75 77  Resp: 16 15  Temp: 98 F (36.7 C) 97.8 F (36.6 C)  SpO2: 100% 100%    General: Pt is alert, awake, not in acute distress Cardiovascular: rate controlled, S1/S2 + Respiratory: bilateral decreased breath sounds at bases Abdominal: Soft, NT, ND, bowel sounds + Extremities: no edema, no cyanosis    The results of significant diagnostics from this hospitalization (including imaging, microbiology, ancillary and laboratory) are listed below for reference.     Microbiology: Recent Results (from the past 240 hour(s))  SARS Coronavirus 2 by RT PCR (hospital order, [facility]) Nasopharyngeal Nasopharyngeal Swab     Status: Abnormal   Collection Time: 12/23/18  6:02 PM   Specimen: Nasopharyngeal Swab  Result Value Ref Range Status   SARS Coronavirus 2 POSITIVE (A) NEGATIVE Final    Comment: RESULT CALLED TO, READ BACK BY AND VERIFIED WITH: Maia PlanJ GLOSTER RN 1945 12/23/18 A BROWNING (NOTE) If result is NEGATIVE SARS-CoV-2 target nucleic acids are NOT DETECTED. The SARS-CoV-2 RNA is generally detectable in upper and lower  respiratory specimens during the acute phase of infection. The lowest  concentration of SARS-CoV-2 viral copies this assay can detect is 250  copies / mL. A negative result does not preclude SARS-CoV-2 infection  and should not be used as the sole basis for treatment or other  patient management decisions.  A negative result may occur with  improper specimen collection / handling, submission of specimen other  than nasopharyngeal swab, presence of viral mutation(s) within the  areas targeted by this assay, and inadequate number of viral copies  (<250 copies / mL). A negative result must be combined with clinical  observations, patient history, and  epidemiological information. If result is POSITIVE SARS-CoV-2 target nucleic acids are DETECTED.  The SARS-CoV-2 RNA is generally detectable in upper and lower  respiratory specimens during the acute phase of infection.  Positive  results are indicative of active infection with SARS-CoV-2.  Clinical  correlation with patient history and other diagnostic information is  necessary to determine patient infection status.  Positive results do  not rule out bacterial infection or co-infection with other viruses. If result is PRESUMPTIVE POSTIVE SARS-CoV-2 nucleic acids MAY BE PRESENT.   A presumptive positive result was obtained on the submitted specimen  and confirmed on repeat testing.  While 2019 novel coronavirus  (SARS-CoV-2) nucleic acids may be present in the submitted sample  additional confirmatory testing may be necessary for epidemiological  and / or clinical management purposes  to differentiate between  SARS-CoV-2 and other Sarbecovirus currently known to infect humans.  If clinically indicated additional testing with an alternate test  methodology [phone]([id number])  is advised. The SARS-CoV-2 RNA is generally  detectable in upper and lower respiratory specimens during the acute  phase of infection. The expected result is Negative. Fact Sheet for Patients:  BoilerBrush.com.cyhttps://www.fda.gov/media/136312/download Fact Sheet for Healthcare Providers: https://pope.com/https://www.fda.gov/media/136313/download This test is not yet approved or cleared by the [place] FDA and has been authorized for detection and/or diagnosis of SARS-CoV-2 by FDA under an Emergency Use Authorization (EUA).  This EUA will remain in effect (meaning this test can be used) for the duration of the COVID-19 declaration under Section 564(b)(1) of the Act, 21 U.S.C. section 360bbb-3(b)(1), unless the authorization is terminated or revoked sooner. Performed at [facility], [address], [place], [place] [address]       Labs: BNP (last 3 results) No results for input(s): BNP in the last 8760 hours. Basic Metabolic Panel: Recent Labs  Lab 12/23/18 1317 12/23/18 1802 12/25/18 0431  NA 141  --  139  K 4.1  --  3.9  CL 109  --  109  CO2 21*  --  20*  GLUCOSE 115*  --  91  BUN 16  --  17  CREATININE 0.90 0.92 0.96  CALCIUM 10.1  --  8.9  MG  --   --  2.0   Liver Function Tests: Recent Labs  Lab 12/23/18 1317  AST 18  ALT 22  ALKPHOS 80  BILITOT 0.7  PROT 7.5  ALBUMIN 4.0   No results for input(s): LIPASE, AMYLASE in the last 168 hours. No results for input(s): AMMONIA in the last 168 hours. CBC: Recent Labs  Lab 12/23/18 1317 12/23/18 1802 12/25/18 0431  WBC 6.5 5.6 4.9  NEUTROABS 3.8  --  2.0  HGB 13.9 13.7 11.5*  HCT 43.1 41.9 34.7*  MCV 87.1 86.2 85.7  PLT 356 313 306   Cardiac Enzymes: No results for input(s): CKTOTAL, CKMB, CKMBINDEX, TROPONINI in the last 168 hours. BNP: Invalid input(s): POCBNP CBG: No results for input(s): GLUCAP in the last 168 hours. D-Dimer No results for input(s): DDIMER in the last 72 hours. Hgb A1c Recent Labs    12/24/18 0458  HGBA1C 5.8*   Lipid Profile Recent Labs    12/24/18 0458  CHOL 242*  HDL 62  LDLCALC 160*  TRIG 101  CHOLHDL 3.9   Thyroid function studies No results for input(s): TSH, T4TOTAL, T3FREE, THYROIDAB in the last 72 hours.  Invalid input(s): FREET3 Anemia work up No results for input(s): VITAMINB12, FOLATE, FERRITIN, TIBC, IRON, RETICCTPCT in the last 72 hours. Urinalysis    Component Value Date/Time   COLORURINE YELLOW 04/30/2007 0724   APPEARANCEUR Clear 04/30/2007 0724   LABSPEC 1.015 04/30/2007 0724   PHURINE 6.5 04/30/2007 0724   GLUCOSEU NEGATIVE 04/30/2007 0724   BILIRUBINUR negative 10/28/2017 1834   BILIRUBINUR clear 07/28/2014 0914   KETONESUR negative 10/28/2017 1834   KETONESUR NEGATIVE 04/30/2007 0724   PROTEINUR negative 10/28/2017 1834   PROTEINUR 30 07/28/2014 0914   UROBILINOGEN  0.2 10/28/2017 1834   UROBILINOGEN 0.2 mg/dL 74/25/9563 8756   NITRITE Negative 10/28/2017 1834   NITRITE neg 07/28/2014 0914   NITRITE Negative 04/30/2007 0724   LEUKOCYTESUR Trace (A) 10/28/2017 1834   Sepsis Labs Invalid input(s): PROCALCITONIN,  WBC,  LACTICIDVEN Microbiology Recent Results (from the past 240 hour(s))  SARS Coronavirus 2 by RT PCR (hospital order, performed in [facility] hospital lab) Nasopharyngeal Nasopharyngeal Swab     Status: Abnormal   Collection Time: 12/23/18  6:02 PM   Specimen: Nasopharyngeal Swab  Result Value Ref Range Status   SARS Coronavirus 2 POSITIVE (A) NEGATIVE Final    Comment: RESULT CALLED TO, READ BACK BY AND VERIFIED WITH: Maia Plan RN 1945 12/23/18 A BROWNING (NOTE) If result is NEGATIVE SARS-CoV-2 target nucleic acids are NOT DETECTED. The SARS-CoV-2 RNA is generally detectable in upper and lower  respiratory specimens during the acute phase of infection. The lowest  concentration of SARS-CoV-2 viral copies this assay can detect is 250  copies / mL. A negative result does not preclude SARS-CoV-2 infection  and should not be used as the sole basis for treatment or other  patient management decisions.  A negative result may occur with  improper specimen collection / handling, submission of specimen other  than nasopharyngeal swab, presence of viral mutation(s) within the  areas targeted by this assay, and inadequate number of viral copies  (<250 copies / mL). A negative result must be combined with clinical  observations, patient history, and epidemiological information. If result is POSITIVE SARS-CoV-2 target nucleic acids are DETECTED.  The SARS-CoV-2 RNA is generally detectable in upper and lower  respiratory specimens during the acute phase of infection.  Positive  results are indicative of active infection with SARS-CoV-2.  Clinical  correlation with patient history and other diagnostic information is  necessary to determine  patient infection status.  Positive results do  not rule out bacterial infection or co-infection with other viruses. If result is PRESUMPTIVE POSTIVE SARS-CoV-2 nucleic acids MAY BE PRESENT.   A presumptive positive result was obtained on the submitted specimen  and confirmed on repeat testing.  While 2019 novel coronavirus  (SARS-CoV-2) nucleic acids may be present in the submitted sample  additional confirmatory testing may be necessary for epidemiological  and / or clinical management purposes  to differentiate between  SARS-CoV-2 and other Sarbecovirus currently known to infect humans.  If clinically indicated additional testing with an alternate test  methodology [phone])  is advised. The SARS-CoV-2 RNA is generally  detectable in upper and lower respiratory specimens during the acute  phase of infection. The expected result is Negative. Fact Sheet for Patients:  StrictlyIdeas.no Fact Sheet for Healthcare Providers: BankingDealers.co.za This test is not yet approved or cleared by the [place] FDA and has been authorized for detection and/or diagnosis of SARS-CoV-2 by FDA under an Emergency Use Authorization (EUA).  This EUA will remain in effect (meaning this test can be used) for the duration of the COVID-19 declaration under Section 564(b)(1) of the Act, 21 U.S.C. section 360bbb-3(b)(1), unless the authorization is terminated or revoked sooner. Performed at [facility], [place] [address] [place], [place] [address]      Time coordinating discharge: 35 minutes  SIGNED:   [name], MD  [facility] 12/25/2018, 9:48 AM

## 2018-12-26 ENCOUNTER — Telehealth: Payer: Self-pay

## 2018-12-26 NOTE — Telephone Encounter (Signed)
Transition Care Management Follow-up Telephone Call   Date discharged? 12/25/2018  How have you been since you were released from the hospital? Feeling okay  Do you understand why you were in the hospital? Yes  Do you understand the discharge instructions? Yes  Where were you discharged to? Home  Items Reviewed:  Medications reviewed: Yes  Allergies reviewed: Yes  Dietary changes reviewed: Yes  Referrals reviewed: Yes   Functional Questionnaire:   Activities of Daily Living (ADLs):   States they are independent in the following: All ADL's States they require assistance with the following: n/a  Any transportation issues/concerns?: Not at this time  Any patient concerns? Not at this time  Confirmed importance and date/time of follow-up visits scheduled: yes  Provider Appointment booked with PCP on 01/01/2019, it is a virtual visit.   Confirmed with patient if condition begins to worsen call PCP or go to the ER: yes Patient was given the office number and encouraged to call back with question or concerns: yes

## 2019-01-01 ENCOUNTER — Encounter: Payer: Self-pay | Admitting: Internal Medicine

## 2019-01-01 ENCOUNTER — Ambulatory Visit (INDEPENDENT_AMBULATORY_CARE_PROVIDER_SITE_OTHER): Payer: Medicare HMO | Admitting: Internal Medicine

## 2019-01-01 DIAGNOSIS — G459 Transient cerebral ischemic attack, unspecified: Secondary | ICD-10-CM | POA: Diagnosis not present

## 2019-01-01 DIAGNOSIS — N1831 Chronic kidney disease, stage 3a: Secondary | ICD-10-CM

## 2019-01-01 DIAGNOSIS — E785 Hyperlipidemia, unspecified: Secondary | ICD-10-CM | POA: Diagnosis not present

## 2019-01-01 DIAGNOSIS — I10 Essential (primary) hypertension: Secondary | ICD-10-CM | POA: Diagnosis not present

## 2019-01-01 NOTE — Assessment & Plan Note (Addendum)
The pt had with right-sided weakness as well as right visual changes. Initial CT of the head was unremarkable. Neurology was consulted.  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  Vascular surgery was consulted and recommended outpatient follow-up for CEA 01/12/19 Dr Early.  MRI of the brain was negative for acute stroke.  Symptoms have improved.  On Plavix, ASA

## 2019-01-01 NOTE — Assessment & Plan Note (Signed)
Amlodipine - d/c; on Metoprolol

## 2019-01-01 NOTE — Assessment & Plan Note (Signed)
Zetia

## 2019-01-01 NOTE — Progress Notes (Signed)
Virtual Visit via Video Note  I connected with Judy Carlson on 01/01/19 at  9:30 AM EDT by a video enabled telemedicine application and verified that I am speaking with the correct person using two identifiers.   I discussed the limitations of evaluation and management by telemedicine and the availability of in person appointments. The patient expressed understanding and agreed to proceed.  History of Present Illness: We need to follow-up -post-  hosp visit: pt had with right-sided weakness as well as right visual changes. Initial CT of the head was unremarkable. Neurology was consulted.  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  Vascular surgery was consulted and recommended outpatient follow-up for CEA 01/12/19 Dr Early.  MRI of the brain was negative for acute stroke.  Symptoms have improved.   Per Hx: "Admit date: 12/23/2018 Discharge date: 12/25/2018  Admitted From: Home Disposition: Home  Recommendations for Outpatient Follow-up:  1. Follow up with PCP in 1 week with repeat CBC/BMP 2. Outpatient follow-up with neurology and vascular surgery.  Might benefit from outpatient pulmonology evaluation also. 3. Follow up in ED if symptoms worsen or new appear   Home Health: No Equipment/Devices: None  Discharge Condition: Stable CODE STATUS: Full Diet recommendation: Heart healthy  Brief/Interim Summary: 68 year old female with history of hypertension, hyperlipidemia, recent COVID-19 infection beginning of September 2020 presented on 12/23/2018 with right-sided weakness as well as right visual changes. Initial CT of the head was unremarkable. Neurology was consulted.  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  Vascular surgery was consulted and recommended outpatient follow-up for CEA.  MRI of the brain was  negative for acute stroke.  Symptoms have improved.  Neurology recommended outpatient follow-up and to continue aspirin, Plavix and his ezetimibe.  Discharge patient home today.  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 Neurology has been consulted. MRI of was negative for acute infarct. -CT angiogram of the head and neck showed negative CTA for large vessel occlusion. 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 Vascular surgery was consulted and recommended outpatient follow-up for CEA.  -LDL 160. Currently on ezetimibe which will be continued on discharge. Apparently, patient cannot tolerate statin because of myalgias.Hemoglobin A1c 5.8 -Follow PT/OT/SLP evaluation and recommendations -Continue aspirin and Plavix on discharge.  Further antiplatelet regimen post CEA will be decided by Dr. Donnetta Hutching. -Consider ophthalmology evaluation as an outpatient.  Essential hypertension -Blood pressure stable.   Antihypertensives on hold for now.  Will resume metoprolol on discharge but keep holding amlodipine till evaluation by PCP.  Neurology recommends to avoid low BP and BP goal of 120-1 50 before right CEA.  Hyperlipidemia--plan as above  Recent Covid 19positive -Patient had symptoms in late August/early September 2020 and was positive on 11/07/2018. -Repeat testing done on 12/23/2018 still positive. Patient currently has no symptoms and respiratory status is stable.   Discharge Instructions      Discharge Instructions    Ambulatory referral to Neurology   Complete by: As directed    Follow up with stroke clinic NP (Jessica Vanschaick or Cecille Rubin, if both not available, consider  Zachery Dauer, or Jaynee Eagles) at  [facility] in about 4 weeks. Thanks.   Diet - low sodium heart healthy   Complete by: As directed    Increase activity slowly   Complete by: As directed      "  There has been no runny nose, cough, chest pain, shortness of breath, abdominal pain, diarrhea, constipation, arthralgias, skin rashes.   Observations/Objective: The patient appears to be in no acute distress, looks well.  Assessment and Plan:  See my Assessment and Plan. Follow Up Instructions:    I discussed the assessment and treatment plan with the patient. The patient was provided an opportunity to ask questions and all were answered. The patient agreed with the plan and demonstrated an understanding of the instructions.   The patient was advised to call back or seek an in-person evaluation if the symptoms worsen or if the condition fails to improve as anticipated.  I provided face-to-face time during this encounter. We were at different locations.   Sonda Primes, MD

## 2019-01-01 NOTE — Assessment & Plan Note (Signed)
Monitor labs 

## 2019-01-05 ENCOUNTER — Other Ambulatory Visit: Payer: Self-pay | Admitting: *Deleted

## 2019-01-06 ENCOUNTER — Encounter (INDEPENDENT_AMBULATORY_CARE_PROVIDER_SITE_OTHER): Payer: Medicare HMO | Admitting: Ophthalmology

## 2019-01-06 NOTE — Progress Notes (Signed)
[place] ([place]), [place] - [address] [place] ([place]) [place] [address] Phone: [phone] Fax: [phone]      Your procedure is scheduled on Wednesday, November 9th, 2020.   Report to [facility] Main Entrance "A" at 7:45 A.M., and check in at the Admitting office.   Call this number if you have problems the morning of surgery:  [phone]  Call [phone] if you have any questions prior to your surgery date Monday-Friday 8am-4pm    Remember:  Do not eat or drink after midnight the night before your surgery    Take these medicines the morning of surgery with A SIP OF WATER :  Ezetimibe (Zetia) Loratadine (Claritin) Metoprolol Succinate (Toprol XL)  If needed: Acetaminophen (Tylenol)  7 days prior to surgery STOP taking any Aspirin (unless otherwise instructed by your surgeon), Aleve, Naproxen, Ibuprofen, Motrin, Advil, Goody's, BC's, all herbal medications, fish oil, and all vitamins.    The Morning of Surgery  Do not wear jewelry, make-up or nail polish.  Do not wear lotions, powders, or perfumes/colognes, or deodorant  Do not shave 48 hours prior to surgery.  Men may shave face and neck.  Do not bring valuables to the hospital.  [facility] is not responsible for any belongings or valuables.  If you are a smoker, DO NOT Smoke 24 hours prior to surgery IF you wear a CPAP at night please bring your mask, tubing, and machine the morning of surgery   Remember that you must have someone to transport you home after your surgery, and remain with you for 24 hours if you are discharged the same day.   Contacts, glasses, hearing aids, dentures or bridgework may not be worn into surgery.    Leave your suitcase in the car.  After surgery it may be brought to your room.  For patients admitted to the hospital, discharge time will be determined by your treatment team.  Patients discharged the day of surgery will not  be allowed to drive home.    Special instructions:   [facility]- Preparing For Surgery  Before surgery, you can play an important role. Because skin is not sterile, your skin needs to be as free of germs as possible. You can reduce the number of germs on your skin by washing with CHG (chlorahexidine gluconate) Soap before surgery.  CHG is an antiseptic cleaner which kills germs and bonds with the skin to continue killing germs even after washing.    Oral Hygiene is also important to reduce your risk of infection.  Remember - BRUSH YOUR TEETH THE MORNING OF SURGERY WITH YOUR REGULAR TOOTHPASTE  Please do not use if you have an allergy to CHG or antibacterial soaps. If your skin becomes reddened/irritated stop using the CHG.  Do not shave (including legs and underarms) for at least 48 hours prior to first CHG shower. It is OK to shave your face.  Please follow these instructions carefully.   1. Shower the NIGHT BEFORE SURGERY and the MORNING OF SURGERY with CHG Soap.   2. If you chose to wash your hair, wash your hair first as usual with your normal shampoo.  3. After you shampoo, rinse your hair and body thoroughly to remove the shampoo.  4. Use CHG as you would any other liquid soap. You can apply CHG directly to the skin and wash gently with a scrungie or a clean washcloth.   5. Apply the CHG Soap  to your body ONLY FROM THE NECK DOWN.  Do not use on open wounds or open sores. Avoid contact with your eyes, ears, mouth and genitals (private parts). Wash Face and genitals (private parts)  with your normal soap.   6. Wash thoroughly, paying special attention to the area where your surgery will be performed.  7. Thoroughly rinse your body with warm water from the neck down.  8. DO NOT shower/wash with your normal soap after using and rinsing off the CHG Soap.  9. Pat yourself dry with a CLEAN TOWEL.  10. Wear CLEAN PAJAMAS to bed the night before surgery, wear comfortable clothes the  morning of surgery  11. Place CLEAN SHEETS on your bed the night of your first shower and DO NOT SLEEP WITH PETS.    Day of Surgery:  Do not apply any deodorants/lotions. Please shower the morning of surgery with the CHG soap  Please wear clean clothes to the hospital/surgery center.   Remember to brush your teeth WITH YOUR REGULAR TOOTHPASTE.   Please read over the following fact sheets that you were given.

## 2019-01-07 ENCOUNTER — Encounter (HOSPITAL_COMMUNITY)
Admission: RE | Admit: 2019-01-07 | Discharge: 2019-01-07 | Disposition: A | Discharge status: Home / Self Care | Payer: Medicare HMO | Source: Ambulatory Visit | Attending: Vascular Surgery | Admitting: Vascular Surgery

## 2019-01-07 ENCOUNTER — Other Ambulatory Visit: Payer: Self-pay

## 2019-01-07 ENCOUNTER — Encounter (HOSPITAL_COMMUNITY): Payer: Self-pay

## 2019-01-07 DIAGNOSIS — Z01812 Encounter for preprocedural laboratory examination: Secondary | ICD-10-CM | POA: Diagnosis present

## 2019-01-07 DIAGNOSIS — I6521 Occlusion and stenosis of right carotid artery: Secondary | ICD-10-CM | POA: Diagnosis not present

## 2019-01-07 HISTORY — DX: Other complications of anesthesia, initial encounter: T88.59XA

## 2019-01-07 LAB — URINALYSIS, ROUTINE W REFLEX MICROSCOPIC
Bilirubin Urine: NEGATIVE
Glucose, UA: NEGATIVE mg/dL
Hgb urine dipstick: NEGATIVE
Ketones, ur: NEGATIVE mg/dL
Nitrite: NEGATIVE
Protein, ur: 30 mg/dL — AB
Specific Gravity, Urine: 1.027 (ref 1.005–1.030)
pH: 5 (ref 5.0–8.0)

## 2019-01-07 LAB — TYPE AND SCREEN
ABO/RH(D): A POS
Antibody Screen: NEGATIVE

## 2019-01-07 LAB — CBC
HCT: 39.5 % (ref 36.0–46.0)
Hemoglobin: 12.7 g/dL (ref 12.0–15.0)
MCH: 28.3 pg (ref 26.0–34.0)
MCHC: 32.2 g/dL (ref 30.0–36.0)
MCV: 88 fL (ref 80.0–100.0)
Platelets: 324 10*3/uL (ref 150–400)
RBC: 4.49 MIL/uL (ref 3.87–5.11)
RDW: 16.6 % — ABNORMAL HIGH (ref 11.5–15.5)
WBC: 6.5 10*3/uL (ref 4.0–10.5)
nRBC: 0 % (ref 0.0–0.2)

## 2019-01-07 LAB — COMPREHENSIVE METABOLIC PANEL
ALT: 25 U/L (ref 0–44)
AST: 21 U/L (ref 15–41)
Albumin: 3.7 g/dL (ref 3.5–5.0)
Alkaline Phosphatase: 77 U/L (ref 38–126)
Anion gap: 9 (ref 5–15)
BUN: 17 mg/dL (ref 8–23)
CO2: 24 mmol/L (ref 22–32)
Calcium: 9.8 mg/dL (ref 8.9–10.3)
Chloride: 111 mmol/L (ref 98–111)
Creatinine, Ser: 1.09 mg/dL — ABNORMAL HIGH (ref 0.44–1.00)
GFR calc Af Amer: >60 mL/min (ref >60)
GFR calc non Af Amer: 52 mL/min — ABNORMAL LOW (ref >60)
Glucose, Bld: 96 mg/dL (ref 70–99)
Potassium: 3.7 mmol/L (ref 3.5–5.1)
Sodium: 144 mmol/L (ref 135–145)
Total Bilirubin: 0.3 mg/dL (ref 0.3–1.2)
Total Protein: 6.9 g/dL (ref 6.5–8.1)

## 2019-01-07 LAB — SURGICAL PCR SCREEN
MRSA, PCR: NEGATIVE
Staphylococcus aureus: NEGATIVE

## 2019-01-07 LAB — APTT: aPTT: 34 seconds (ref 24–36)

## 2019-01-07 LAB — PROTIME-INR
INR: 1 (ref 0.8–1.2)
Prothrombin Time: 13 seconds (ref 11.4–15.2)

## 2019-01-07 LAB — ABO/RH: ABO/RH(D): A POS

## 2019-01-07 NOTE — Progress Notes (Signed)
PCP - Lew Dawes Cardiologist - Dr. Einar Gip  Chest x-ray - 12-23-18 EKG - 12-23-18 ECHO - 12-24-18  SA - yes, does not wear CPAP  Plavix instructions: pt instructed to stop Plavix on Friday, 01-09-19, per Dr. Donnetta Hutching. Aspirin Instructions: Instructed pt to call Dr. Luther Parody office for instructions on when to stop ASA 325. Pt stated understanding.   COVID TEST- pt tested positive 11-23-18 & 12-23-18   Anesthesia review: yes, recent history  Patient denies shortness of breath, fever, cough and chest pain at PAT appointment   All instructions explained to the patient, with a verbal understanding of the material. Patient agrees to go over the instructions while at home for a better understanding. Patient also instructed to self quarantine after being tested for COVID-19. The opportunity to ask questions was provided.

## 2019-01-08 NOTE — Progress Notes (Signed)
Anesthesia Chart Review: Recently admitted 10/20-10/22 for right-sided weakness as well as right visual changes.  Initial CT of the head was unremarkable.  Neurology was consulted.  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 Echo showed EF 60-65%, normal valves. Vascular surgery was consulted and recommended outpatient follow-up for CEA.  MRI of the brain was negative for acute stroke.  Symptoms improved during admission.  Neurology recommended outpatient follow-up and to continue aspirin, Plavix and his ezetimibe.    Recent Covid 19positive -Patient had symptoms in late August/early September 2020 and was positive on 11/07/2018. -Repeat testing done on 12/23/2018 still positive. Patient was asymptomatic from respiratory standpoint. Per protocol, asymptomatic >10d after positive test will not be retested preop.   Proep labs reviewed, WNL.  EKG 12/23/18: Sinus rhythm. Rate 60. Borderline T wave abnormalities  TTE 12/24/18:  1. Left ventricular ejection fraction, by visual estimation, is 60 to 65%. The left ventricle has normal function. There is no left ventricular hypertrophy.  2. Left ventricular diastolic function could not be evaluated pattern of LV diastolic filling.  3. Global right ventricle has normal systolic function.The right ventricular size is normal. Mildly increased right ventricular wall thickness.  4. Left atrial size was not well visualized.  5. Right atrial size was not well visualized.  6. The mitral valve is grossly normal. Trace mitral valve regurgitation.  7. The tricuspid valve is grossly normal. Tricuspid valve regurgitation is trivial.  8. The aortic valve is tricuspid Aortic valve regurgitation was not visualized by color flow Doppler. Structurally normal aortic valve, with no evidence of sclerosis or stenosis.  9. The pulmonic valve was not well visualized. Pulmonic valve regurgitation  was not assessed by color flow Doppler. 10. The inferior vena cava is normal in size with greater than 50% respiratory variability, suggesting right atrial pressure of 3 mmHg.  CTA head/neck 12/24/18: IMPRESSION: 1. Negative CTA for large vessel occlusion. 2. Severe short-segment 80% atheromatous stenosis about the right bifurcation/proximal right ICA. 3. 40% atheromatous stenosis about the left bifurcation/proximal left ICA. 4. Short-segment severe stenoses at the origin of the right vertebral as well as at the right V4 segment. Left vertebral artery dominant without hemodynamically significant stenosis. 5. Moderate atherosclerotic change about the aortic arch and carotid siphons without high-grade stenosis.  Wynonia Musty [facility] Short Stay Center/Anesthesiology Phone [phone] 01/08/2019 11:28 AM

## 2019-01-08 NOTE — Anesthesia Preprocedure Evaluation (Addendum)
Anesthesia Evaluation  Patient identified by MRN, date of birth, ID band Patient awake    Reviewed: Allergy & Precautions, NPO status , Patient's Chart, lab work & pertinent test results, reviewed documented beta blocker date and time   History of Anesthesia Complications Negative for: history of anesthetic complications  Airway Mallampati: II  TM Distance: >3 FB Neck ROM: Full    Dental  (+) Dental Advisory Given, Teeth Intact   Pulmonary COPD, Recent URI , former smoker,  + covid in September, currently asymptomatic however test remains positive    breath sounds clear to auscultation       Cardiovascular hypertension, Pt. on medications and Pt. on home beta blockers (-) angina(-) Past MI  Rhythm:Regular     Neuro/Psych PSYCHIATRIC DISORDERS Anxiety Depression TIA Neuromuscular disease    GI/Hepatic Neg liver ROS, GERD  Controlled,  Endo/Other    Renal/GU CRFRenal disease     Musculoskeletal  (+) Arthritis ,   Abdominal   Peds  Hematology negative hematology ROS (+)   Anesthesia Other Findings  1. Left ventricular ejection fraction, by visual estimation, is 60 to 65%. The left ventricle has normal function. There is no left ventricular hypertrophy.  2. Left ventricular diastolic function could not be evaluated pattern of LV diastolic filling.  3. Global right ventricle has normal systolic function.The right ventricular size is normal. Mildly increased right ventricular wall thickness.  4. Left atrial size was not well visualized.  5. Right atrial size was not well visualized.  6. The mitral valve is grossly normal. Trace mitral valve regurgitation.  7. The tricuspid valve is grossly normal. Tricuspid valve regurgitation is trivial.  8. The aortic valve is tricuspid Aortic valve regurgitation was not visualized by color flow Doppler. Structurally normal aortic valve, with no evidence of sclerosis or stenosis.  9. The  pulmonic valve was not well visualized. Pulmonic valve regurgitation was not assessed by color flow Doppler. 10. The inferior vena cava is normal in size with greater than 50% respiratory variability, suggesting right atrial pressure of 3 mmHg.  Reproductive/Obstetrics                            Anesthesia Physical Anesthesia Plan  ASA: III  Anesthesia Plan: General   Post-op Pain Management:    Induction: Intravenous  PONV Risk Score and Plan: 3 and Ondansetron and Dexamethasone  Airway Management Planned: Oral ETT  Additional Equipment: Arterial line  Intra-op Plan:   Post-operative Plan: Extubation in OR  Informed Consent: I have reviewed the patients History and Physical, chart, labs and discussed the procedure including the risks, benefits and alternatives for the proposed anesthesia with the patient or authorized representative who has indicated his/her understanding and acceptance.     Dental advisory given  Plan Discussed with: CRNA and Surgeon  Anesthesia Plan Comments: (Recently admitted 10/20-10/22 for right-sided weakness as well as right visual changes.  Initial CT of the head was unremarkable.  Neurology was consulted.  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 Echo showed EF 60-65%, normal valves. Vascular surgery was consulted and recommended outpatient follow-up for CEA.  MRI of the brain was negative for acute stroke.  Symptoms improved during admission.  Neurology recommended outpatient follow-up and to continue aspirin, Plavix and his ezetimibe.    Recent Covid 19positive -Patient had symptoms in late August/early September 2020 and was positive on  11/07/2018. -Repeat testing done on 12/23/2018 still positive. Patient was asymptomatic from respiratory standpoint. Per protocol, asymptomatic >10d after positive test will not be retested preop.   Proep  labs reviewed, WNL.  EKG 12/23/18: Sinus rhythm. Rate 60. Borderline T wave abnormalities  TTE 12/24/18:  1. Left ventricular ejection fraction, by visual estimation, is 60 to 65%. The left ventricle has normal function. There is no left ventricular hypertrophy.  2. Left ventricular diastolic function could not be evaluated pattern of LV diastolic filling.  3. Global right ventricle has normal systolic function.The right ventricular size is normal. Mildly increased right ventricular wall thickness.  4. Left atrial size was not well visualized.  5. Right atrial size was not well visualized.  6. The mitral valve is grossly normal. Trace mitral valve regurgitation.  7. The tricuspid valve is grossly normal. Tricuspid valve regurgitation is trivial.  8. The aortic valve is tricuspid Aortic valve regurgitation was not visualized by color flow Doppler. Structurally normal aortic valve, with no evidence of sclerosis or stenosis.  9. The pulmonic valve was not well visualized. Pulmonic valve regurgitation was not assessed by color flow Doppler. 10. The inferior vena cava is normal in size with greater than 50% respiratory variability, suggesting right atrial pressure of 3 mmHg.  CTA head/neck 12/24/18: IMPRESSION: 1. Negative CTA for large vessel occlusion. 2. Severe short-segment 80% atheromatous stenosis about the right bifurcation/proximal right ICA. 3. 40% atheromatous stenosis about the left bifurcation/proximal left ICA. 4. Short-segment severe stenoses at the origin of the right vertebral as well as at the right V4 segment. Left vertebral artery dominant without hemodynamically significant stenosis. 5. Moderate atherosclerotic change about the aortic arch and carotid siphons without high-grade stenosis.)    Anesthesia Quick Evaluation

## 2019-01-12 ENCOUNTER — Encounter (HOSPITAL_COMMUNITY): Payer: Self-pay

## 2019-01-12 ENCOUNTER — Inpatient Hospital Stay (HOSPITAL_COMMUNITY)
Admission: RE | Admit: 2019-01-12 | Discharge: 2019-01-13 | DRG: 038 | Disposition: A | Discharge status: Home / Self Care | Payer: Medicare HMO | Attending: Vascular Surgery | Admitting: Vascular Surgery

## 2019-01-12 ENCOUNTER — Inpatient Hospital Stay (HOSPITAL_COMMUNITY): Payer: Medicare HMO | Admitting: Certified Registered Nurse Anesthetist

## 2019-01-12 ENCOUNTER — Inpatient Hospital Stay (HOSPITAL_COMMUNITY): Payer: Medicare HMO | Admitting: Physician Assistant

## 2019-01-12 ENCOUNTER — Encounter (HOSPITAL_COMMUNITY)
Admission: RE | Disposition: A | Discharge status: Home / Self Care | Payer: Self-pay | Source: Home / Self Care | Attending: Vascular Surgery

## 2019-01-12 ENCOUNTER — Other Ambulatory Visit: Payer: Self-pay

## 2019-01-12 DIAGNOSIS — Z881 Allergy status to other antibiotic agents status: Secondary | ICD-10-CM

## 2019-01-12 DIAGNOSIS — Z888 Allergy status to other drugs, medicaments and biological substances status: Secondary | ICD-10-CM | POA: Diagnosis not present

## 2019-01-12 DIAGNOSIS — Z8619 Personal history of other infectious and parasitic diseases: Secondary | ICD-10-CM | POA: Diagnosis not present

## 2019-01-12 DIAGNOSIS — Z79899 Other long term (current) drug therapy: Secondary | ICD-10-CM | POA: Diagnosis not present

## 2019-01-12 DIAGNOSIS — N183 Chronic kidney disease, stage 3 unspecified: Secondary | ICD-10-CM | POA: Diagnosis not present

## 2019-01-12 DIAGNOSIS — I6523 Occlusion and stenosis of bilateral carotid arteries: Secondary | ICD-10-CM | POA: Diagnosis not present

## 2019-01-12 DIAGNOSIS — Z8349 Family history of other endocrine, nutritional and metabolic diseases: Secondary | ICD-10-CM | POA: Diagnosis not present

## 2019-01-12 DIAGNOSIS — K219 Gastro-esophageal reflux disease without esophagitis: Secondary | ICD-10-CM | POA: Diagnosis not present

## 2019-01-12 DIAGNOSIS — M47816 Spondylosis without myelopathy or radiculopathy, lumbar region: Secondary | ICD-10-CM | POA: Diagnosis present

## 2019-01-12 DIAGNOSIS — I7300 Raynaud's syndrome without gangrene: Secondary | ICD-10-CM | POA: Diagnosis present

## 2019-01-12 DIAGNOSIS — M1990 Unspecified osteoarthritis, unspecified site: Secondary | ICD-10-CM | POA: Diagnosis present

## 2019-01-12 DIAGNOSIS — I10 Essential (primary) hypertension: Secondary | ICD-10-CM | POA: Diagnosis not present

## 2019-01-12 DIAGNOSIS — Z91410 Personal history of adult physical and sexual abuse: Secondary | ICD-10-CM

## 2019-01-12 DIAGNOSIS — Z87891 Personal history of nicotine dependence: Secondary | ICD-10-CM

## 2019-01-12 DIAGNOSIS — E785 Hyperlipidemia, unspecified: Secondary | ICD-10-CM | POA: Diagnosis present

## 2019-01-12 DIAGNOSIS — I129 Hypertensive chronic kidney disease with stage 1 through stage 4 chronic kidney disease, or unspecified chronic kidney disease: Secondary | ICD-10-CM | POA: Diagnosis not present

## 2019-01-12 DIAGNOSIS — Z8249 Family history of ischemic heart disease and other diseases of the circulatory system: Secondary | ICD-10-CM | POA: Diagnosis not present

## 2019-01-12 DIAGNOSIS — H34231 Retinal artery branch occlusion, right eye: Secondary | ICD-10-CM | POA: Diagnosis present

## 2019-01-12 DIAGNOSIS — I6529 Occlusion and stenosis of unspecified carotid artery: Secondary | ICD-10-CM | POA: Diagnosis present

## 2019-01-12 DIAGNOSIS — U071 COVID-19: Secondary | ICD-10-CM | POA: Diagnosis not present

## 2019-01-12 DIAGNOSIS — I6521 Occlusion and stenosis of right carotid artery: Secondary | ICD-10-CM | POA: Diagnosis not present

## 2019-01-12 HISTORY — PX: ENDARTERECTOMY: SHX5162

## 2019-01-12 HISTORY — PX: PATCH ANGIOPLASTY: SHX6230

## 2019-01-12 LAB — CBC
HCT: 30.7 % — ABNORMAL LOW (ref 36.0–46.0)
Hemoglobin: 10.1 g/dL — ABNORMAL LOW (ref 12.0–15.0)
MCH: 28.3 pg (ref 26.0–34.0)
MCHC: 32.9 g/dL (ref 30.0–36.0)
MCV: 86 fL (ref 80.0–100.0)
Platelets: 306 10*3/uL (ref 150–400)
RBC: 3.57 MIL/uL — ABNORMAL LOW (ref 3.87–5.11)
RDW: 16.4 % — ABNORMAL HIGH (ref 11.5–15.5)
WBC: 7.7 10*3/uL (ref 4.0–10.5)
nRBC: 0 % (ref 0.0–0.2)

## 2019-01-12 LAB — CREATININE, SERUM
Creatinine, Ser: 0.92 mg/dL (ref 0.44–1.00)
GFR calc Af Amer: >60 mL/min (ref >60)
GFR calc non Af Amer: >60 mL/min (ref >60)

## 2019-01-12 SURGERY — ENDARTERECTOMY, CAROTID
Anesthesia: General | Site: Neck | Laterality: Right

## 2019-01-12 MED ORDER — ACETAMINOPHEN 160 MG/5ML PO SOLN: 1000.0000 mg | Freq: Once | ORAL | Status: DC | PRN

## 2019-01-12 MED ORDER — EZETIMIBE 10 MG PO TABS
10.0000 mg | ORAL_TABLET | Freq: Every day | ORAL | Status: DC
  Administered 2019-01-13: 10 mg via ORAL
  Filled 2019-01-12: qty 1

## 2019-01-12 MED ORDER — SODIUM CHLORIDE 0.9 % IV SOLN
INTRAVENOUS | Status: AC
  Filled 2019-01-12: qty 1.2

## 2019-01-12 MED ORDER — PROPOFOL 10 MG/ML IV BOLUS
INTRAVENOUS | Status: DC | PRN
  Administered 2019-01-12: 60 mg via INTRAVENOUS
  Administered 2019-01-12: 40 mg via INTRAVENOUS

## 2019-01-12 MED ORDER — ACETAMINOPHEN 500 MG PO TABS: 1000.0000 mg | ORAL_TABLET | Freq: Once | ORAL | Status: DC | PRN

## 2019-01-12 MED ORDER — SODIUM CHLORIDE 0.9 % IV SOLN: INTRAVENOUS | Status: DC

## 2019-01-12 MED ORDER — PHENYLEPHRINE HCL (PRESSORS) 10 MG/ML IV SOLN
INTRAVENOUS | Status: DC | PRN
  Administered 2019-01-12: 80 ug via INTRAVENOUS

## 2019-01-12 MED ORDER — OXYCODONE HCL 5 MG PO TABS
5.0000 mg | ORAL_TABLET | ORAL | Status: DC | PRN
  Administered 2019-01-13: 5 mg via ORAL
  Filled 2019-01-12: qty 1

## 2019-01-12 MED ORDER — GUAIFENESIN-DM 100-10 MG/5ML PO SYRP: 15.0000 mL | ORAL_SOLUTION | ORAL | Status: DC | PRN

## 2019-01-12 MED ORDER — PROTAMINE SULFATE 10 MG/ML IV SOLN
INTRAVENOUS | Status: AC
  Filled 2019-01-12: qty 5

## 2019-01-12 MED ORDER — CEFAZOLIN SODIUM-DEXTROSE 2-4 GM/100ML-% IV SOLN
2.0000 g | INTRAVENOUS | Status: AC
  Administered 2019-01-12: 2 g via INTRAVENOUS
  Filled 2019-01-12: qty 100

## 2019-01-12 MED ORDER — CLOPIDOGREL BISULFATE 75 MG PO TABS
75.0000 mg | ORAL_TABLET | Freq: Every day | ORAL | Status: DC
  Administered 2019-01-12 – 2019-01-13 (×2): 75 mg via ORAL
  Filled 2019-01-12 (×2): qty 1

## 2019-01-12 MED ORDER — PROTAMINE SULFATE 10 MG/ML IV SOLN
INTRAVENOUS | Status: DC | PRN
  Administered 2019-01-12: 50 mg via INTRAVENOUS

## 2019-01-12 MED ORDER — ACETAMINOPHEN 10 MG/ML IV SOLN
INTRAVENOUS | Status: AC
  Filled 2019-01-12: qty 100

## 2019-01-12 MED ORDER — FENTANYL CITRATE (PF) 100 MCG/2ML IJ SOLN
25.0000 ug | INTRAMUSCULAR | Status: DC | PRN
  Administered 2019-01-12: 14:00:00 50 ug via INTRAVENOUS

## 2019-01-12 MED ORDER — MIDAZOLAM HCL 5 MG/5ML IJ SOLN
INTRAMUSCULAR | Status: DC | PRN
  Administered 2019-01-12: 2 mg via INTRAVENOUS

## 2019-01-12 MED ORDER — 0.9 % SODIUM CHLORIDE (POUR BTL) OPTIME
TOPICAL | Status: DC | PRN
  Administered 2019-01-12 (×2): 1000 mL

## 2019-01-12 MED ORDER — HYDROMORPHONE HCL 1 MG/ML IJ SOLN
0.5000 mg | INTRAMUSCULAR | Status: DC | PRN
  Administered 2019-01-12: 1 mg via INTRAVENOUS
  Filled 2019-01-12: qty 1

## 2019-01-12 MED ORDER — ROCURONIUM BROMIDE 50 MG/5ML IV SOSY
PREFILLED_SYRINGE | INTRAVENOUS | Status: DC | PRN
  Administered 2019-01-12: 70 mg via INTRAVENOUS

## 2019-01-12 MED ORDER — ACETAMINOPHEN 325 MG PO TABS
325.0000 mg | ORAL_TABLET | ORAL | Status: DC | PRN
  Administered 2019-01-13: 650 mg via ORAL
  Filled 2019-01-12: qty 2

## 2019-01-12 MED ORDER — DOCUSATE SODIUM 100 MG PO CAPS
100.0000 mg | ORAL_CAPSULE | Freq: Every day | ORAL | Status: DC
  Administered 2019-01-13: 100 mg via ORAL
  Filled 2019-01-12: qty 1

## 2019-01-12 MED ORDER — METOPROLOL TARTRATE 5 MG/5ML IV SOLN: 2.0000 mg | INTRAVENOUS | Status: DC | PRN

## 2019-01-12 MED ORDER — METOPROLOL SUCCINATE ER 100 MG PO TB24
100.0000 mg | ORAL_TABLET | Freq: Every day | ORAL | Status: DC
  Administered 2019-01-13: 100 mg via ORAL
  Filled 2019-01-12: qty 1

## 2019-01-12 MED ORDER — HEPARIN SODIUM (PORCINE) 5000 UNIT/ML IJ SOLN
5000.0000 [IU] | Freq: Three times a day (TID) | INTRAMUSCULAR | Status: DC
  Administered 2019-01-13: 5000 [IU] via SUBCUTANEOUS
  Filled 2019-01-12: qty 1

## 2019-01-12 MED ORDER — PROPOFOL 10 MG/ML IV BOLUS
INTRAVENOUS | Status: AC
  Filled 2019-01-12: qty 20

## 2019-01-12 MED ORDER — CEFAZOLIN SODIUM-DEXTROSE 2-4 GM/100ML-% IV SOLN
2.0000 g | Freq: Three times a day (TID) | INTRAVENOUS | Status: AC
  Administered 2019-01-13 (×2): 2 g via INTRAVENOUS
  Filled 2019-01-12 (×3): qty 100

## 2019-01-12 MED ORDER — EZETIMIBE 10 MG PO TABS: 10.0000 mg | ORAL_TABLET | Freq: Every day | ORAL | Status: DC

## 2019-01-12 MED ORDER — SODIUM CHLORIDE 0.9 % IV SOLN: 500.0000 mL | Freq: Once | INTRAVENOUS | Status: DC | PRN

## 2019-01-12 MED ORDER — ACETAMINOPHEN 10 MG/ML IV SOLN: 1000.0000 mg | Freq: Once | INTRAVENOUS | Status: DC | PRN

## 2019-01-12 MED ORDER — OXYCODONE HCL 5 MG/5ML PO SOLN: 5.0000 mg | Freq: Once | ORAL | Status: DC | PRN

## 2019-01-12 MED ORDER — PHENOL 1.4 % MT LIQD: 1.0000 | OROMUCOSAL | Status: DC | PRN

## 2019-01-12 MED ORDER — ONDANSETRON HCL 4 MG/2ML IJ SOLN
INTRAMUSCULAR | Status: DC | PRN
  Administered 2019-01-12: 4 mg via INTRAVENOUS

## 2019-01-12 MED ORDER — NITROGLYCERIN 0.2 MG/ML ON CALL CATH LAB
INTRAVENOUS | Status: DC | PRN
  Administered 2019-01-12 (×4): 40 ug via INTRAVENOUS

## 2019-01-12 MED ORDER — FENTANYL CITRATE (PF) 250 MCG/5ML IJ SOLN
INTRAMUSCULAR | Status: AC
  Filled 2019-01-12: qty 5

## 2019-01-12 MED ORDER — DEXAMETHASONE SODIUM PHOSPHATE 10 MG/ML IJ SOLN
INTRAMUSCULAR | Status: DC | PRN
  Administered 2019-01-12: 4 mg via INTRAVENOUS

## 2019-01-12 MED ORDER — FENTANYL CITRATE (PF) 100 MCG/2ML IJ SOLN
INTRAMUSCULAR | Status: DC | PRN
  Administered 2019-01-12 (×2): 50 ug via INTRAVENOUS

## 2019-01-12 MED ORDER — ASPIRIN EC 325 MG PO TBEC
325.0000 mg | DELAYED_RELEASE_TABLET | Freq: Every day | ORAL | Status: DC
  Administered 2019-01-13: 325 mg via ORAL
  Filled 2019-01-12: qty 1

## 2019-01-12 MED ORDER — PHENYLEPHRINE 40 MCG/ML (10ML) SYRINGE FOR IV PUSH (FOR BLOOD PRESSURE SUPPORT)
PREFILLED_SYRINGE | INTRAVENOUS | Status: AC
  Filled 2019-01-12: qty 10

## 2019-01-12 MED ORDER — ALUM & MAG HYDROXIDE-SIMETH 200-200-20 MG/5ML PO SUSP: 15.0000 mL | ORAL | Status: DC | PRN

## 2019-01-12 MED ORDER — SENNOSIDES-DOCUSATE SODIUM 8.6-50 MG PO TABS: 1.0000 | ORAL_TABLET | Freq: Every evening | ORAL | Status: DC | PRN

## 2019-01-12 MED ORDER — SODIUM CHLORIDE 0.9 % IV SOLN
INTRAVENOUS | Status: DC | PRN
  Administered 2019-01-12: 11:00:00 500 mL

## 2019-01-12 MED ORDER — HEPARIN SODIUM (PORCINE) 1000 UNIT/ML IJ SOLN
INTRAMUSCULAR | Status: DC | PRN
  Administered 2019-01-12: 8000 [IU] via INTRAVENOUS

## 2019-01-12 MED ORDER — LACTATED RINGERS IV SOLN
INTRAVENOUS | Status: DC
  Administered 2019-01-12 (×2): via INTRAVENOUS

## 2019-01-12 MED ORDER — BISACODYL 5 MG PO TBEC: 5.0000 mg | DELAYED_RELEASE_TABLET | Freq: Every day | ORAL | Status: DC | PRN

## 2019-01-12 MED ORDER — LABETALOL HCL 5 MG/ML IV SOLN
10.0000 mg | INTRAVENOUS | Status: DC | PRN
  Administered 2019-01-13: 10 mg via INTRAVENOUS
  Filled 2019-01-12 (×2): qty 4

## 2019-01-12 MED ORDER — LIDOCAINE HCL (PF) 1 % IJ SOLN
INTRAMUSCULAR | Status: AC
  Filled 2019-01-12: qty 30

## 2019-01-12 MED ORDER — SODIUM CHLORIDE 0.9 % IV SOLN
0.0125 ug/kg/min | INTRAVENOUS | Status: AC
  Administered 2019-01-12: .2 ug/kg/min via INTRAVENOUS
  Filled 2019-01-12: qty 2000

## 2019-01-12 MED ORDER — FUROSEMIDE 40 MG PO TABS: 40.0000 mg | ORAL_TABLET | Freq: Every day | ORAL | Status: DC | PRN

## 2019-01-12 MED ORDER — LORATADINE 10 MG PO TABS: 10.0000 mg | ORAL_TABLET | Freq: Every day | ORAL | Status: DC

## 2019-01-12 MED ORDER — HEPARIN SODIUM (PORCINE) 1000 UNIT/ML IJ SOLN
INTRAMUSCULAR | Status: AC
  Filled 2019-01-12: qty 1

## 2019-01-12 MED ORDER — POTASSIUM CHLORIDE CRYS ER 20 MEQ PO TBCR: 20.0000 meq | EXTENDED_RELEASE_TABLET | Freq: Every day | ORAL | Status: DC | PRN

## 2019-01-12 MED ORDER — ONDANSETRON HCL 4 MG/2ML IJ SOLN
INTRAMUSCULAR | Status: AC
  Administered 2019-01-12: 4 mg via INTRAVENOUS
  Filled 2019-01-12: qty 2

## 2019-01-12 MED ORDER — PANTOPRAZOLE SODIUM 40 MG PO TBEC
40.0000 mg | DELAYED_RELEASE_TABLET | Freq: Every day | ORAL | Status: DC
  Administered 2019-01-12 – 2019-01-13 (×2): 40 mg via ORAL
  Filled 2019-01-12 (×2): qty 1

## 2019-01-12 MED ORDER — PHENYLEPHRINE HCL-NACL 10-0.9 MG/250ML-% IV SOLN
INTRAVENOUS | Status: DC | PRN
  Administered 2019-01-12: 25 ug/min via INTRAVENOUS

## 2019-01-12 MED ORDER — SUGAMMADEX SODIUM 200 MG/2ML IV SOLN
INTRAVENOUS | Status: DC | PRN
  Administered 2019-01-12: 200 mg via INTRAVENOUS

## 2019-01-12 MED ORDER — MIDAZOLAM HCL 2 MG/2ML IJ SOLN
INTRAMUSCULAR | Status: AC
  Filled 2019-01-12: qty 2

## 2019-01-12 MED ORDER — ONDANSETRON HCL 4 MG/2ML IJ SOLN
INTRAMUSCULAR | Status: AC
  Filled 2019-01-12: qty 2

## 2019-01-12 MED ORDER — CHLORHEXIDINE GLUCONATE 4 % EX LIQD: 60.0000 mL | Freq: Once | CUTANEOUS | Status: DC

## 2019-01-12 MED ORDER — MAGNESIUM SULFATE 2 GM/50ML IV SOLN: 2.0000 g | Freq: Every day | INTRAVENOUS | Status: DC | PRN

## 2019-01-12 MED ORDER — ONDANSETRON HCL 4 MG/2ML IJ SOLN
4.0000 mg | Freq: Four times a day (QID) | INTRAMUSCULAR | Status: DC | PRN
  Administered 2019-01-12: 14:00:00 4 mg via INTRAVENOUS

## 2019-01-12 MED ORDER — HYDRALAZINE HCL 20 MG/ML IJ SOLN: 5.0000 mg | INTRAMUSCULAR | Status: DC | PRN

## 2019-01-12 MED ORDER — LABETALOL HCL 5 MG/ML IV SOLN
INTRAVENOUS | Status: DC | PRN
  Administered 2019-01-12 (×2): 10 mg via INTRAVENOUS

## 2019-01-12 MED ORDER — FENTANYL CITRATE (PF) 100 MCG/2ML IJ SOLN
INTRAMUSCULAR | Status: AC
  Administered 2019-01-12: 50 ug via INTRAVENOUS
  Filled 2019-01-12: qty 2

## 2019-01-12 MED ORDER — OXYCODONE HCL 5 MG PO TABS: 5.0000 mg | ORAL_TABLET | Freq: Once | ORAL | Status: DC | PRN

## 2019-01-12 MED ORDER — ACETAMINOPHEN 325 MG RE SUPP: 325.0000 mg | RECTAL | Status: DC | PRN

## 2019-01-12 SURGICAL SUPPLY — 39 items
ADH SKN CLS APL DERMABOND .7 (GAUZE/BANDAGES/DRESSINGS) ×1
CANISTER SUCT 3000ML PPV (MISCELLANEOUS) ×2 IMPLANT
CANNULA VESSEL 3MM 2 BLNT TIP (CANNULA) ×4 IMPLANT
CATH ROBINSON RED A/P 18FR (CATHETERS) ×2 IMPLANT
CLIP LIGATING EXTRA MED SLVR (CLIP) ×2 IMPLANT
CLIP LIGATING EXTRA SM BLUE (MISCELLANEOUS) ×2 IMPLANT
COVER WAND RF STERILE (DRAPES) ×1 IMPLANT
DECANTER SPIKE VIAL GLASS SM (MISCELLANEOUS) IMPLANT
DERMABOND ADVANCED (GAUZE/BANDAGES/DRESSINGS) ×1
DERMABOND ADVANCED .7 DNX12 (GAUZE/BANDAGES/DRESSINGS) ×1 IMPLANT
DRAIN HEMOVAC 1/8 X 5 (WOUND CARE) IMPLANT
ELECT REM PT RETURN 9FT ADLT (ELECTROSURGICAL) ×2
ELECTRODE REM PT RTRN 9FT ADLT (ELECTROSURGICAL) ×1 IMPLANT
EVACUATOR SILICONE 100CC (DRAIN) IMPLANT
GLOVE SS BIOGEL STRL SZ 7.5 (GLOVE) ×1 IMPLANT
GLOVE SUPERSENSE BIOGEL SZ 7.5 (GLOVE) ×1
GOWN STRL REUS W/ TWL LRG LVL3 (GOWN DISPOSABLE) ×3 IMPLANT
GOWN STRL REUS W/TWL LRG LVL3 (GOWN DISPOSABLE) ×6
KIT BASIN OR (CUSTOM PROCEDURE TRAY) ×2 IMPLANT
KIT SHUNT ARGYLE CAROTID ART 6 (VASCULAR PRODUCTS) IMPLANT
KIT TURNOVER KIT B (KITS) ×2 IMPLANT
NEEDLE 22X1 1/2 (OR ONLY) (NEEDLE) IMPLANT
NS IRRIG 1000ML POUR BTL (IV SOLUTION) ×4 IMPLANT
PACK CAROTID (CUSTOM PROCEDURE TRAY) ×2 IMPLANT
PAD ARMBOARD 7.5X6 YLW CONV (MISCELLANEOUS) ×4 IMPLANT
PATCH HEMASHIELD 8X75 (Vascular Products) ×1 IMPLANT
POSITIONER HEAD DONUT 9IN (MISCELLANEOUS) ×2 IMPLANT
SHUNT CAROTID BYPASS 10 (VASCULAR PRODUCTS) ×1 IMPLANT
SHUNT CAROTID BYPASS 12FRX15.5 (VASCULAR PRODUCTS) IMPLANT
SUT ETHILON 3 0 PS 1 (SUTURE) IMPLANT
SUT PROLENE 6 0 CC (SUTURE) ×4 IMPLANT
SUT SILK 3 0 (SUTURE)
SUT SILK 3-0 18XBRD TIE 12 (SUTURE) IMPLANT
SUT VIC AB 3-0 SH 27 (SUTURE) ×4
SUT VIC AB 3-0 SH 27X BRD (SUTURE) ×2 IMPLANT
SUT VICRYL 4-0 PS2 18IN ABS (SUTURE) ×2 IMPLANT
SYR CONTROL 10ML LL (SYRINGE) IMPLANT
TOWEL GREEN STERILE (TOWEL DISPOSABLE) ×2 IMPLANT
WATER STERILE IRR 1000ML POUR (IV SOLUTION) ×2 IMPLANT

## 2019-01-12 NOTE — Anesthesia Procedure Notes (Signed)
Procedure Name: Intubation Date/Time: 01/12/2019 11:05 AM Performed by: Inda Coke, CRNA Pre-anesthesia Checklist: Patient identified, Emergency Drugs available, Suction available and Patient being monitored Patient Re-evaluated:Patient Re-evaluated prior to induction Oxygen Delivery Method: Circle System Utilized Preoxygenation: Pre-oxygenation with 100% oxygen Induction Type: IV induction Ventilation: Mask ventilation without difficulty Laryngoscope Size: Mac and 3 Grade View: Grade I Tube type: Oral Tube size: 7.5 mm Number of attempts: 1 Airway Equipment and Method: Stylet and Oral airway Placement Confirmation: ETT inserted through vocal cords under direct vision,  positive ETCO2 and breath sounds checked- equal and bilateral Secured at: 22 cm Tube secured with: Tape Dental Injury: Teeth and Oropharynx as per pre-operative assessment

## 2019-01-12 NOTE — Anesthesia Procedure Notes (Signed)
Arterial Line Insertion Start/End11/11/2018 9:10 AM, 01/12/2019 9:15 AM Performed by: Valda Favia, CRNA, CRNA  Preanesthetic checklist: patient identified, IV checked, site marked, risks and benefits discussed, surgical consent, monitors and equipment checked, pre-op evaluation, timeout performed and anesthesia consent Lidocaine 1% used for infiltration Left, radial was placed Catheter size: 20 G Hand hygiene performed  and maximum sterile barriers used  Allen's test indicative of satisfactory collateral circulation Attempts: 1 Procedure performed without using ultrasound guided technique. Ultrasound Notes:anatomy identified Following insertion, dressing applied and Biopatch. Post procedure assessment: normal  Patient tolerated the procedure well with no immediate complications.

## 2019-01-12 NOTE — Discharge Instructions (Signed)
   [facility]  Discharge Instructions   Carotid Endarterectomy (CEA)  Please refer to the following instructions for your post-procedure care. Your surgeon or physician assistant will discuss any changes with you.  Activity  You are encouraged to walk as much as you can. You can slowly return to normal activities but must avoid strenuous activity and heavy lifting until your doctor tell you it's OK. Avoid activities such as vacuuming or swinging a golf club. You can drive after one week if you are comfortable and you are no longer taking prescription pain medications. It is normal to feel tired for serval weeks after your surgery. It is also normal to have difficulty with sleep habits, eating, and bowel movements after surgery. These will go away with time.  Bathing/Showering  You may shower after you come home. Do not soak in a bathtub, hot tub, or swim until the incision heals completely.  Incision Care  Shower every day. Clean your incision with mild soap and water. Pat the area dry with a clean towel. You do not need a bandage unless otherwise instructed. Do not apply any ointments or creams to your incision. You may have skin glue on your incision. Do not peel it off. It will come off on its own in about one week. Your incision may feel thickened and raised for several weeks after your surgery. This is normal and the skin will soften over time. For Men Only: It's OK to shave around the incision but do not shave the incision itself for 2 weeks. It is common to have numbness under your chin that could last for several months.  Diet  Resume your normal diet. There are no special food restrictions following this procedure. A low fat/low cholesterol diet is recommended for all patients with vascular disease. In order to heal from your surgery, it is CRITICAL to get adequate nutrition. Your body requires vitamins, minerals, and protein. Vegetables are the best  source of vitamins and minerals. Vegetables also provide the perfect balance of protein. Processed food has little nutritional value, so try to avoid this.        Medications  Resume taking all of your medications unless your doctor or physician assistant tells you not to. If your incision is causing pain, you may take over-the- counter pain relievers such as acetaminophen (Tylenol). If you were prescribed a stronger pain medication, please be aware these medications can cause nausea and constipation. Prevent nausea by taking the medication with a snack or meal. Avoid constipation by drinking plenty of fluids and eating foods with a high amount of fiber, such as fruits, vegetables, and grains. Do not take Tylenol if you are taking prescription pain medications.  Follow Up  Our office will schedule a follow up appointment 2-3 weeks following discharge.  Please call us immediately for any of the following conditions  Increased pain, redness, drainage (pus) from your incision site. Fever of 101 degrees or higher. If you should develop stroke (slurred speech, difficulty swallowing, weakness on one side of your body, loss of vision) you should call 911 and go to the nearest emergency room.  Reduce your risk of vascular disease:  Stop smoking. If you would like help call [facility] at 1-800-QUIT-NOW (1-800-784-8669) or [place] at 336-586-4000. Manage your cholesterol Maintain a desired weight Control your diabetes Keep your blood pressure down  If you have any questions, please call the office at [phone].   

## 2019-01-12 NOTE — Interval H&P Note (Signed)
History and Physical Interval Note:  01/12/2019 10:00 AM  Judy Carlson  has presented today for surgery, with the diagnosis of right carotid stenosis.  The various methods of treatment have been discussed with the patient and family. After consideration of risks, benefits and other options for treatment, the patient has consented to  Procedure(s): ENDARTERECTOMY CAROTID (Right) as a surgical intervention.  The patient's history has been reviewed, patient examined, no change in status, stable for surgery.  I have reviewed the patient's chart and labs.  Questions were answered to the patient's satisfaction.     Curt Jews

## 2019-01-12 NOTE — Transfer of Care (Signed)
Immediate Anesthesia Transfer of Care Note  Patient: Judy Carlson  Procedure(s) Performed: right carotid ENDARTERECTOMY (Right Neck) Patch Angioplasty of right carotid artery using hemashield paltinum finesse patch (Right Neck)  Patient Location: PACU  Anesthesia Type:General  Level of Consciousness: awake, alert  and oriented  Airway & Oxygen Therapy: Patient Spontanous Breathing and Patient connected to nasal cannula oxygen  Post-op Assessment: Report given to RN, Post -op Vital signs reviewed and stable, Patient moving all extremities X 4 and Patient able to stick tongue midline  Post vital signs: Reviewed and stable  Last Vitals:  Vitals Value Taken Time  BP 141/85 01/12/19 1314  Temp 36.5 C 01/12/19 1314  Pulse 81 01/12/19 1321  Resp 18 01/12/19 1321  SpO2 95 % 01/12/19 1321  Vitals shown include unvalidated device data.  Last Pain:  Vitals:   01/12/19 1314  TempSrc:   PainSc: (P) 0-No pain         Complications: No apparent anesthesia complications

## 2019-01-12 NOTE — Progress Notes (Addendum)
Patient arrived from pacu to 4e13, patient vital signs obtained and patient placed on monitor. CHG done. Will monitor patient. bp 109/63 Judy Carlson, [name] rN

## 2019-01-12 NOTE — Progress Notes (Signed)
   In PACU s/p right CEA Moving all 4 ext. Right neck incision intact without hematoma, no tongue deviation and smile is symmetric  Stable post op disposition  Roxy Horseman PA-C

## 2019-01-12 NOTE — Op Note (Signed)
° °  OPERATIVE REPORT  DATE OF SURGERY: 01/12/2019  PATIENT: Judy Carlson, 68 y.o. female MRN: [id number]  DOB: 1951-01-15  PRE-OPERATIVE DIAGNOSIS: Right Carotid Stenosis, Symptomatic  POST-OPERATIVE DIAGNOSIS:  Same  PROCEDURE:  Right Carotid Endarterectomy with Dacron Patch Angioplasty  SURGEON:  Curt Jews, M.D.  PHYSICIAN ASSISTANT: Laurence Slate, PA-C  ANESTHESIA:   general  EBL: Less than 200 ml  Total I/O In: 1000 [I.V.:1000] Out: 25 [Blood:25]  BLOOD ADMINISTERED: none  DRAINS: none   SPECIMEN: none  COUNTS CORRECT:  YES  PLAN OF CARE: Admit to inpatient   PATIENT DISPOSITION:  PACU - hemodynamically stable and neurologically intact.  PROCEDURE DETAILS: The patient was taken to the operating room placed in supine position.  General anesthesia was administered.  The neck was prepped and draped in the usual sterile fashion.  An incision was made anterior to the sternocleidomastoid and carried down through the platysma with electrocautery.  The sternocleidomastoid was reflected posteriorly and the carotid sheath was opened.  The facial vein was ligated with 2-0 silk ties and divided.  The common carotid artery was encircled with an umbilical tape and Rummel tourniquet.  The vagus nerve was identified and preserved.  Dissection was continued onto the carotid bifurcation.  The superior thyroid artery was encircled with a 2-0 silk Potts tie.  The external carotid was encircled with a blue vessel loop and the internal carotid was encircled with an umbilical tape and Rummel tourniquet.  The hypoglossal nerve was identified and preserved.  The patient was given systemic heparin and after adequate circulation time, the internal, external and common carotid arteries were occluded with vascular clamps.  The common carotid artery was opened with an 11 blade and extended  longitudinally with Potts scissors.  A 10 shunt was passed up the internal carotid and allowed to backbleed.  It  was then passed down the common carotid where it was secured with Rummel tourniquet.  The endarterectomy was begun on the common carotid artery and the plaque was divided proximally with Potts scissors.  The endarterectomy was continued onto the bifurcation.  The external carotid was endarterectomized with an eversion technique and the internal carotid was endarterectomized in an open fashion.  Remaining atheromatous debris was removed from the endarterectomy plane.  A Finesse Hemashield Dacron patch was brought onto the field and was sewn as a patch angioplasty with a running 6-0 Prolene suture.  Prior to completion of the closure the shunt was removed and the usual flushing maneuvers were undertaken.  The anastomosis was completed and flow was restored first to the external and then the internal carotid artery.  Excellent flow characteristics were noted with hand-held Doppler in the internal and external carotid arteries.  The patient was given 50 mg of protamine to reverse the heparin.  The wounds were irrigated with saline.  Hemostasis was obtained with electrocautery.  The wounds were closed with 3-0 Vicryl to reapproximate the sternocleidomastoid over the carotid sheath.  The platysma was lysed with a running 3-0 Vicryl suture.  The skin was closed with a 4-0 subcuticular Vicryl stitch.  Dermabond was applied.  The patient was awakened neurologically intact in the operating room and transferred to the recovery room in stable condition   Curt Jews, M.D. 01/12/2019 1:17 PM

## 2019-01-13 ENCOUNTER — Encounter (HOSPITAL_COMMUNITY): Payer: Self-pay | Admitting: Vascular Surgery

## 2019-01-13 ENCOUNTER — Inpatient Hospital Stay: Admission: RE | Admit: 2019-01-13 | Payer: Medicare HMO | Source: Ambulatory Visit

## 2019-01-13 ENCOUNTER — Telehealth: Payer: Self-pay

## 2019-01-13 LAB — BASIC METABOLIC PANEL
Anion gap: 8 (ref 5–15)
BUN: 13 mg/dL (ref 8–23)
CO2: 18 mmol/L — ABNORMAL LOW (ref 22–32)
Calcium: 8.8 mg/dL — ABNORMAL LOW (ref 8.9–10.3)
Chloride: 114 mmol/L — ABNORMAL HIGH (ref 98–111)
Creatinine, Ser: 0.93 mg/dL (ref 0.44–1.00)
GFR calc Af Amer: >60 mL/min (ref >60)
GFR calc non Af Amer: >60 mL/min (ref >60)
Glucose, Bld: 109 mg/dL — ABNORMAL HIGH (ref 70–99)
Potassium: 4.5 mmol/L (ref 3.5–5.1)
Sodium: 140 mmol/L (ref 135–145)

## 2019-01-13 LAB — CBC
HCT: 31.8 % — ABNORMAL LOW (ref 36.0–46.0)
Hemoglobin: 10.2 g/dL — ABNORMAL LOW (ref 12.0–15.0)
MCH: 28.2 pg (ref 26.0–34.0)
MCHC: 32.1 g/dL (ref 30.0–36.0)
MCV: 87.8 fL (ref 80.0–100.0)
Platelets: 329 10*3/uL (ref 150–400)
RBC: 3.62 MIL/uL — ABNORMAL LOW (ref 3.87–5.11)
RDW: 17 % — ABNORMAL HIGH (ref 11.5–15.5)
WBC: 10 10*3/uL (ref 4.0–10.5)
nRBC: 0 % (ref 0.0–0.2)

## 2019-01-13 MED ORDER — OXYCODONE HCL 5 MG PO TABS: 5.0000 mg | ORAL_TABLET | Freq: Four times a day (QID) | ORAL | 0 refills | Status: DC | PRN

## 2019-01-13 NOTE — Progress Notes (Addendum)
[facility]   Subjective  - Soreness at the incision, otherwise no new complaints.   Objective (!) 159/79 65 97.9 F (36.6 C) (!) 24 98%  Intake/Output Summary (Last 24 hours) at 01/13/2019 0718 Last data filed at 01/12/2019 1248 Gross per 24 hour  Intake 1000 ml  Output 25 ml  Net 975 ml    Moving all 4 ext Right neck incision healing well without hematoma No tongue deviation and smile is symmetric Lungs non labored breathing  Assessment/Planning: POD # 1 right CEA  Some nausea over night if she tolerates breakfast plan for discharge today. F/U 2-3 weeks with Dr. Armstead Peaks 01/13/2019 7:18 AM --  Laboratory Lab Results: Recent Labs    01/12/19 1904 01/13/19 0455  WBC 7.7 10.0  HGB 10.1* 10.2*  HCT 30.7* 31.8*  PLT 306 329   BMET Recent Labs    01/12/19 1904 01/13/19 0455  NA  --  140  K  --  4.5  CL  --  114*  CO2  --  18*  GLUCOSE  --  109*  BUN  --  13  CREATININE 0.92 0.93  CALCIUM  --  8.8*    COAG Lab Results  Component Value Date   INR 1.0 01/07/2019   INR 0.9 12/23/2018   INR 0.97 05/14/2013   No results found for: PTT  I have examined the patient, reviewed and agree with above.  Curt Jews, MD 01/13/2019 7:23 AM

## 2019-01-13 NOTE — Anesthesia Postprocedure Evaluation (Signed)
Anesthesia Post Note  Patient: Judy Carlson  Procedure(s) Performed: right carotid ENDARTERECTOMY (Right Neck) Patch Angioplasty of right carotid artery using hemashield paltinum finesse patch (Right Neck)     Patient location during evaluation: PACU Anesthesia Type: General Level of consciousness: awake and alert Pain management: pain level controlled Vital Signs Assessment: post-procedure vital signs reviewed and stable Respiratory status: spontaneous breathing, nonlabored ventilation, respiratory function stable and patient connected to nasal cannula oxygen Cardiovascular status: blood pressure returned to baseline and stable Postop Assessment: no apparent nausea or vomiting Anesthetic complications: no    Last Vitals:  Vitals:   01/13/19 1003 01/13/19 1034  BP: (!) 204/91 (!) 174/85  Pulse:    Resp: 15   Temp:    SpO2:      Last Pain:  Vitals:   01/13/19 1001  TempSrc:   PainSc: 0-No pain                 Thornton Dohrmann

## 2019-01-13 NOTE — Telephone Encounter (Signed)
Copied from [place] [id number]. Topic: Quick Communication - Rx Refill/Question >> Jan 13, 2019  1:29 PM Erick Blinks wrote: Pt wants to know if she can resume Amlodipine, Dr. Donnetta Hutching is requesting PCP's approval first. Pt states that she must take amlodipine along with metoprolol in order to reduce her BP. Please advise Best contact: [phone]

## 2019-01-13 NOTE — Progress Notes (Signed)
Pt provided discharge instructions and education. Telebox removed/cmd notified. Pt iv removed and intact. Vitals stable. BP back to normal limits. Pt has all belongings. Pt denies any complaints. Volunteers called to tx pt via wheelchair to valet to meet ride. Jerald Kief, RN

## 2019-01-13 NOTE — Progress Notes (Signed)
Pt bp 204/91. Rechecked x2. Manual pressure was 200/96. Pt asymptomatic.  Dr Early notified. Advises to give prn dose of Labetalol and monitor. Pt can still d/c if pressure becomes more stable. Will continue to monitor. Jerald Kief, RN

## 2019-01-14 ENCOUNTER — Telehealth: Payer: Self-pay | Admitting: *Deleted

## 2019-01-14 NOTE — Telephone Encounter (Signed)
Pt was on TCM report admitted 01/12/19, and underwent a Right Carotid Endarterectomy. Procedure went well, and pt will follow=up w/surgeon Early, [name], MD (Vascular Surgery) in 2 weeks (01/27/2019).Marland KitchenJohny Carlson

## 2019-01-15 NOTE — Telephone Encounter (Signed)
Yes, pls Thx 
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
## 2019-01-16 ENCOUNTER — Other Ambulatory Visit: Payer: Self-pay | Admitting: *Deleted

## 2019-01-16 NOTE — Telephone Encounter (Signed)
Pt contacted and informed of approval to take the amlodipine and metoprolol.
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
## 2019-01-19 ENCOUNTER — Other Ambulatory Visit: Payer: Self-pay | Admitting: *Deleted

## 2019-01-19 ENCOUNTER — Other Ambulatory Visit: Payer: Self-pay

## 2019-01-19 NOTE — Patient Outreach (Signed)
[facility]) Care Management  01/19/2019  Judy Carlson [birth year] [id number]   RED ON EMMI ALERT Day # 4 Date: 01/18/2019 Red Alert Reason: No follow up scheduled, lost interest in things, sad/hopeless/anxious   Outreach attempt #1, successful.  Received notification of red EMMI alert as member was recently discharged from hospital for carotid stenosis, also tested positive for Covid.  Call placed to member to follow up on alert, state she has not had appointment yet with Dr. Donnetta Hutching (vascular) but she has reached out to office and waiting on call back.  Per chart, she has appointments scheduled with neurology on 11/25, vascular on 12/1, and PCP on 1/14.  Reviewed appointments with member, denies need for transportation assistance.   Report she was feeling sad and lost interest in things due to difficulty contacting MD offices and recent diagnoses.  Denies the need for counseling, denies any urgent concerns at this time.     Plan: RN CM will not open to nursing as she denies needs.    Valente David, [place], MSN [place] [phone]

## 2019-01-19 NOTE — Patient Outreach (Signed)
[facility]) Care Management  01/19/2019  Judy Carlson [birth year] [id number]   Social work referral received from [facility], Sonda Rumble, to mail Advance Directive documentation and follow up with patient to assist with completion. Advance Directive Emmi and packet mailed today.  Will follow up within the next two weeks to ensure receipt and review.  Ronn Melena, BSW Social Worker [phone]

## 2019-01-22 DIAGNOSIS — H34231 Retinal artery branch occlusion, right eye: Secondary | ICD-10-CM | POA: Diagnosis not present

## 2019-01-26 ENCOUNTER — Other Ambulatory Visit: Payer: Self-pay

## 2019-01-26 DIAGNOSIS — G459 Transient cerebral ischemic attack, unspecified: Secondary | ICD-10-CM

## 2019-01-26 MED ORDER — EZETIMIBE 10 MG PO TABS: 10.0000 mg | ORAL_TABLET | Freq: Every day | ORAL | 5 refills | Status: DC

## 2019-01-26 MED ORDER — CLOPIDOGREL BISULFATE 75 MG PO TABS: 75.0000 mg | ORAL_TABLET | Freq: Every day | ORAL | 5 refills | Status: DC

## 2019-01-28 ENCOUNTER — Other Ambulatory Visit: Payer: Self-pay

## 2019-01-28 ENCOUNTER — Encounter: Payer: Self-pay | Admitting: Adult Health

## 2019-01-28 ENCOUNTER — Ambulatory Visit: Payer: Medicare HMO | Admitting: Adult Health

## 2019-01-28 VITALS — BP 126/78 | HR 90 | Temp 97.3°F | Ht 65.0 in | Wt 187.2 lb

## 2019-01-28 DIAGNOSIS — I10 Essential (primary) hypertension: Secondary | ICD-10-CM | POA: Diagnosis not present

## 2019-01-28 DIAGNOSIS — E785 Hyperlipidemia, unspecified: Secondary | ICD-10-CM | POA: Diagnosis not present

## 2019-01-28 DIAGNOSIS — I6521 Occlusion and stenosis of right carotid artery: Secondary | ICD-10-CM | POA: Diagnosis not present

## 2019-01-28 DIAGNOSIS — H34231 Retinal artery branch occlusion, right eye: Secondary | ICD-10-CM

## 2019-01-28 DIAGNOSIS — Z9889 Other specified postprocedural states: Secondary | ICD-10-CM | POA: Diagnosis not present

## 2019-01-28 NOTE — Patient Instructions (Signed)
Follow-up with Dr. Donnetta Hutching next week in regards to your recent carotid procedure  Continue to follow with ophthalmology as scheduled as well as initial evaluation by retina specialist  Continue aspirin 81 mg daily and clopidogrel 75 mg daily  and Zetia for secondary stroke prevention  Continue to follow up with PCP regarding cholesterol and blood pressure management   Recommend monitoring blood pressure at home to ensure satisfactory levels  Maintain strict control of hypertension with blood pressure goal below 130/90, diabetes with hemoglobin A1c goal below 6.5% and cholesterol with LDL cholesterol (bad cholesterol) goal below 70 mg/dL. I also advised the patient to eat a healthy diet with plenty of whole grains, cereals, fruits and vegetables, exercise regularly and maintain ideal body weight.  Followup in the future with me in 4 months or call earlier if needed       Thank you for coming to see [place] at [facility]. I hope we have been able to provide you high quality care today.  You may receive a patient satisfaction survey over the next few weeks. We would appreciate your feedback and comments so that we may continue to improve ourselves and the health of our patients.
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
## 2019-02-02 ENCOUNTER — Ambulatory Visit: Payer: Self-pay

## 2019-02-02 ENCOUNTER — Other Ambulatory Visit: Payer: Self-pay

## 2019-02-02 NOTE — Patient Outreach (Signed)
[facility]) Care Management  02/02/2019  ANNA-MARIE COLLER [birth year] [id number]   Attempted to contact patient today to ensure receipt of Advance Directives that was mailed on 01/19/19 and to assist with completion if needed.  Left voicemail message.  Will attempt to reach again within four business days.  Ronn Melena, BSW Social Worker [phone]

## 2019-02-03 ENCOUNTER — Encounter: Payer: Self-pay | Admitting: Vascular Surgery

## 2019-02-03 ENCOUNTER — Ambulatory Visit (INDEPENDENT_AMBULATORY_CARE_PROVIDER_SITE_OTHER): Payer: Self-pay | Admitting: Vascular Surgery

## 2019-02-03 ENCOUNTER — Other Ambulatory Visit: Payer: Self-pay

## 2019-02-03 VITALS — BP 107/73 | HR 73 | Temp 97.2°F | Resp 20 | Ht 65.0 in | Wt 183.8 lb

## 2019-02-03 DIAGNOSIS — I6521 Occlusion and stenosis of right carotid artery: Secondary | ICD-10-CM

## 2019-02-03 NOTE — Progress Notes (Signed)
   Patient name: Judy Carlson MRN: [id number] DOB: [birth year] Sex: female  REASON FOR VISIT: Follow-up right carotid endarterectomy on 01/12/2019  HPI: Judy Carlson is a 68 y.o. female here today for follow-up.  She underwent uneventful endarterectomy for symptomatic disease.  She was discharged home on postoperative day 1.  She reports no new neurologic deficits.  She does report the usual periincisional numbness and also some numbness of her right earlobe.  Current Outpatient Medications  Medication Sig Dispense Refill  . acetaminophen (TYLENOL) 650 MG CR tablet Take 1,300 mg by mouth as needed for pain.     Marland Kitchen aspirin EC 325 MG EC tablet Take 1 tablet (325 mg total) by mouth daily. 30 tablet 0  . Cholecalciferol (VITAMIN D) 2000 UNITS CAPS Take 2,000 Units by mouth daily.    . clopidogrel (PLAVIX) 75 MG tablet Take 1 tablet (75 mg total) by mouth daily. 30 tablet 5  . ezetimibe (ZETIA) 10 MG tablet Take 1 tablet (10 mg total) by mouth daily. 30 tablet 5  . Ferrous Sulfate (IRON PO) Take 1 tablet by mouth daily.     . furosemide (LASIX) 40 MG tablet Take 1 tablet (40 mg total) by mouth daily as needed. 90 tablet 3  . loratadine (CLARITIN) 10 MG tablet Take 10 mg by mouth daily as needed.     . meloxicam (MOBIC) 7.5 MG tablet Take 7.5 mg by mouth as needed for pain.     . metoprolol succinate (TOPROL-XL) 100 MG 24 hr tablet TAKE ONE TABLET BY MOUTH ONCE DAILY WITH OR IMMEDIATELY FOLLOWING A MEAL. (Patient taking differently: Take 100 mg by mouth daily. WITH OR IMMEDIATELY FOLLOWING A MEAL.) 90 tablet 3  . Omega-3 Fatty Acids (FISH OIL PO) Take 1 capsule by mouth daily.     Marland Kitchen POTASSIUM PO Take 1 tablet by mouth as needed. Takes it with furosemide     No current facility-administered medications for this visit.      PHYSICAL EXAM: Vitals:   02/03/19 1519 02/03/19 1522  BP: 108/69 107/73  Pulse: 73   Resp: 20   Temp: (!) 97.2 F (36.2 C)   SpO2:  98%   Weight: 183 lb 12.8 oz (83.4 kg)   Height: 5\' 5"  (1.651 m)     GENERAL: The patient is a well-nourished female, in no acute distress. The vital signs are documented above. Well-healed right neck incision with the usual healing ridge.  No carotid bruits bilaterally. Neurologically intact  MEDICAL ISSUES: Stable postop right carotid endarterectomy for symptomatic disease.  Will continue full activity and will see [place] again in 9 months with repeat carotid duplex   Rosetta Posner, MD [facility] Tel [phone] Pager [phone]
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
## 2019-02-04 ENCOUNTER — Ambulatory Visit (INDEPENDENT_AMBULATORY_CARE_PROVIDER_SITE_OTHER): Payer: Medicare HMO | Admitting: Ophthalmology

## 2019-02-04 ENCOUNTER — Encounter (INDEPENDENT_AMBULATORY_CARE_PROVIDER_SITE_OTHER): Payer: Self-pay | Admitting: Ophthalmology

## 2019-02-04 ENCOUNTER — Other Ambulatory Visit: Payer: Self-pay

## 2019-02-04 DIAGNOSIS — H35033 Hypertensive retinopathy, bilateral: Secondary | ICD-10-CM

## 2019-02-04 DIAGNOSIS — H25812 Combined forms of age-related cataract, left eye: Secondary | ICD-10-CM | POA: Diagnosis not present

## 2019-02-04 DIAGNOSIS — H34231 Retinal artery branch occlusion, right eye: Secondary | ICD-10-CM

## 2019-02-04 DIAGNOSIS — H3581 Retinal edema: Secondary | ICD-10-CM

## 2019-02-04 DIAGNOSIS — Z961 Presence of intraocular lens: Secondary | ICD-10-CM

## 2019-02-04 DIAGNOSIS — I10 Essential (primary) hypertension: Secondary | ICD-10-CM | POA: Diagnosis not present

## 2019-02-04 NOTE — Patient Outreach (Signed)
[facility]) Care Management  02/04/2019  Judy Carlson [birth year] [id number]   Follow up call to patient today to ensure receipt of Advance Directive Emmi and packet.  Patient has received documentation but stated she has not thoroughly reviewed it yet . Did address a few questions that she had about completion of documents and what to do with them once completed.  Closing case at this time but did encourage patient to call if additional assistance is needed.  Ronn Melena, BSW Social Worker [phone]

## 2019-02-16 DIAGNOSIS — R69 Illness, unspecified: Secondary | ICD-10-CM | POA: Diagnosis not present

## 2019-02-19 DIAGNOSIS — Z961 Presence of intraocular lens: Secondary | ICD-10-CM | POA: Diagnosis not present

## 2019-02-19 DIAGNOSIS — H2512 Age-related nuclear cataract, left eye: Secondary | ICD-10-CM | POA: Diagnosis not present

## 2019-02-19 DIAGNOSIS — H1045 Other chronic allergic conjunctivitis: Secondary | ICD-10-CM | POA: Diagnosis not present

## 2019-02-19 DIAGNOSIS — H52203 Unspecified astigmatism, bilateral: Secondary | ICD-10-CM | POA: Diagnosis not present

## 2019-02-19 DIAGNOSIS — H5202 Hypermetropia, left eye: Secondary | ICD-10-CM | POA: Diagnosis not present

## 2019-02-19 DIAGNOSIS — H5211 Myopia, right eye: Secondary | ICD-10-CM | POA: Diagnosis not present

## 2019-02-19 DIAGNOSIS — H34231 Retinal artery branch occlusion, right eye: Secondary | ICD-10-CM | POA: Diagnosis not present

## 2019-03-19 ENCOUNTER — Encounter: Payer: Self-pay | Admitting: Internal Medicine

## 2019-03-19 ENCOUNTER — Other Ambulatory Visit: Payer: Self-pay

## 2019-03-19 ENCOUNTER — Ambulatory Visit (INDEPENDENT_AMBULATORY_CARE_PROVIDER_SITE_OTHER): Payer: Medicare HMO | Admitting: Internal Medicine

## 2019-03-19 VITALS — BP 126/74 | HR 67 | Temp 98.0°F | Ht 65.0 in | Wt 187.0 lb

## 2019-03-19 DIAGNOSIS — I6521 Occlusion and stenosis of right carotid artery: Secondary | ICD-10-CM | POA: Diagnosis not present

## 2019-03-19 DIAGNOSIS — D509 Iron deficiency anemia, unspecified: Secondary | ICD-10-CM | POA: Diagnosis not present

## 2019-03-19 DIAGNOSIS — E785 Hyperlipidemia, unspecified: Secondary | ICD-10-CM

## 2019-03-19 DIAGNOSIS — E559 Vitamin D deficiency, unspecified: Secondary | ICD-10-CM | POA: Diagnosis not present

## 2019-03-19 DIAGNOSIS — N1831 Chronic kidney disease, stage 3a: Secondary | ICD-10-CM

## 2019-03-19 DIAGNOSIS — G459 Transient cerebral ischemic attack, unspecified: Secondary | ICD-10-CM

## 2019-03-19 LAB — CBC WITH DIFFERENTIAL/PLATELET
Basophils Absolute: 0 10*3/uL (ref 0.0–0.1)
Basophils Relative: 0.7 % (ref 0.0–3.0)
Eosinophils Absolute: 0.1 10*3/uL (ref 0.0–0.7)
Eosinophils Relative: 2.2 % (ref 0.0–5.0)
HCT: 36.5 % (ref 36.0–46.0)
Hemoglobin: 11.5 g/dL — ABNORMAL LOW (ref 12.0–15.0)
Lymphocytes Relative: 33.8 % (ref 12.0–46.0)
Lymphs Abs: 1.8 10*3/uL (ref 0.7–4.0)
MCHC: 31.5 g/dL (ref 30.0–36.0)
MCV: 89.2 fl (ref 78.0–100.0)
Monocytes Absolute: 0.6 10*3/uL (ref 0.1–1.0)
Monocytes Relative: 10.5 % (ref 3.0–12.0)
Neutro Abs: 2.8 10*3/uL (ref 1.4–7.7)
Neutrophils Relative %: 52.8 % (ref 43.0–77.0)
Platelets: 357 10*3/uL (ref 150.0–400.0)
RBC: 4.09 Mil/uL (ref 3.87–5.11)
RDW: 16.8 % — ABNORMAL HIGH (ref 11.5–15.5)
WBC: 5.3 10*3/uL (ref 4.0–10.5)

## 2019-03-19 LAB — BASIC METABOLIC PANEL
BUN: 18 mg/dL (ref 6–23)
CO2: 26 mEq/L (ref 19–32)
Calcium: 9.6 mg/dL (ref 8.4–10.5)
Chloride: 109 mEq/L (ref 96–112)
Creatinine, Ser: 0.91 mg/dL (ref 0.40–1.20)
GFR: 74.23 mL/min (ref >60.00)
Glucose, Bld: 124 mg/dL — ABNORMAL HIGH (ref 70–99)
Potassium: 3.2 mEq/L — ABNORMAL LOW (ref 3.5–5.1)
Sodium: 143 mEq/L (ref 135–145)

## 2019-03-19 LAB — TSH: TSH: 2.41 u[IU]/mL (ref 0.35–4.50)

## 2019-03-19 MED ORDER — AMLODIPINE BESYLATE 5 MG PO TABS: 5.0000 mg | ORAL_TABLET | Freq: Every day | ORAL | 3 refills | Status: DC

## 2019-03-19 MED ORDER — METOPROLOL SUCCINATE ER 100 MG PO TB24: ORAL_TABLET | ORAL | 3 refills | Status: DC

## 2019-03-19 NOTE — Assessment & Plan Note (Signed)
Vit D 

## 2019-03-19 NOTE — Assessment & Plan Note (Signed)
On Plavix, ASA S/p R endarterectomy 11/20 

## 2019-03-19 NOTE — Progress Notes (Signed)
Subjective:  Patient ID: Judy Carlson, female    DOB: 01/08/1951  Age: 69 y.o. MRN: [id number]  CC: No chief complaint on file.   HPI Judy Carlson presents for carotid disease, R eye arterial occlusion   Per hx review: "Procedure(s): right carotid ENDARTERECTOMY Patch Angioplasty of right carotid artery using hemashield paltinum finesse patch  Discharged Condition: stable  HPI: Judy Carlson a 69 y.o.femalepresented to the emergency room with branch retinal artery occlusion, right eye. She also reported some tingling in her right arm and reports this is actually progressed since her presentation to the emergency department yesterday. She had presented to her ophthalmologist office for evaluation before being treated in the emergency department. Of note she was diagnosed with Covid in September of this year.  CTA reveals 80% right carotid stenosis, 40% left carotid stenosis.  She was scheduled for right CEA.   Hospital Course:  Judy Carlson is a 69 y.o. female is S/P  Procedure(s): right carotid ENDARTERECTOMY Patch Angioplasty of right carotid artery using hemashield paltinum finesse patch Uneventful stay over night other than nausea.  Post op day 1 she tolerated breakfast .  No neuro deficits.  Discharged home in stable condition"  F/u HTN, dyslipidemia  Outpatient Medications Prior to Visit  Medication Sig Dispense Refill  . acetaminophen (TYLENOL) 650 MG CR tablet Take 1,300 mg by mouth as needed for pain.     Marland Kitchen aspirin EC 325 MG EC tablet Take 1 tablet (325 mg total) by mouth daily. 30 tablet 0  . Cholecalciferol (VITAMIN D) 2000 UNITS CAPS Take 2,000 Units by mouth daily.    . clopidogrel (PLAVIX) 75 MG tablet Take 1 tablet (75 mg total) by mouth daily. 30 tablet 5  . ezetimibe (ZETIA) 10 MG tablet Take 1 tablet (10 mg total) by mouth daily. 30 tablet 5  . Ferrous Sulfate (IRON PO) Take 1 tablet by mouth daily.     . furosemide (LASIX) 40 MG tablet  Take 1 tablet (40 mg total) by mouth daily as needed. 90 tablet 3  . loratadine (CLARITIN) 10 MG tablet Take 10 mg by mouth daily as needed.     . meloxicam (MOBIC) 7.5 MG tablet Take 7.5 mg by mouth as needed for pain.     . metoprolol succinate (TOPROL-XL) 100 MG 24 hr tablet TAKE ONE TABLET BY MOUTH ONCE DAILY WITH OR IMMEDIATELY FOLLOWING A MEAL. (Patient taking differently: Take 100 mg by mouth daily. WITH OR IMMEDIATELY FOLLOWING A MEAL.) 90 tablet 3  . Omega-3 Fatty Acids (FISH OIL PO) Take 1 capsule by mouth daily.     Marland Kitchen POTASSIUM PO Take 1 tablet by mouth as needed. Takes it with furosemide     No facility-administered medications prior to visit.    ROS: Review of Systems  Constitutional: Negative for activity change, appetite change, chills, fatigue and unexpected weight change.  HENT: Negative for congestion, mouth sores and sinus pressure.   Eyes: Negative for visual disturbance.  Respiratory: Negative for cough and chest tightness.   Gastrointestinal: Negative for abdominal pain and nausea.  Genitourinary: Negative for difficulty urinating, frequency and vaginal pain.  Musculoskeletal: Negative for back pain and gait problem.  Skin: Negative for pallor and rash.  Neurological: Negative for dizziness, tremors, weakness, numbness and headaches.  Psychiatric/Behavioral: Negative for confusion and sleep disturbance.    Objective:  BP 126/74 (BP Location: Left Arm, Patient Position: Sitting, Cuff Size: Large)   Pulse 67   Temp 98  F (36.7 C) (Oral)   Ht 5\' 5"  (1.651 m)   Wt 187 lb (84.8 kg)   SpO2 97%   BMI 31.12 kg/m   BP Readings from Last 3 Encounters:  03/19/19 126/74  02/03/19 107/73  01/28/19 126/78    Wt Readings from Last 3 Encounters:  03/19/19 187 lb (84.8 kg)  02/03/19 183 lb 12.8 oz (83.4 kg)  01/28/19 187 lb 3.2 oz (84.9 kg)    Physical Exam Constitutional:      General: She is not in acute distress.    Appearance: She is well-developed. She is  obese.  HENT:     Head: Normocephalic.     Right Ear: External ear normal.     Left Ear: External ear normal.     Nose: Nose normal.  Eyes:     General:        Right eye: No discharge.        Left eye: No discharge.     Conjunctiva/sclera: Conjunctivae normal.     Pupils: Pupils are equal, round, and reactive to light.  Neck:     Thyroid: No thyromegaly.     Vascular: No JVD.     Trachea: No tracheal deviation.  Cardiovascular:     Rate and Rhythm: Normal rate and regular rhythm.     Heart sounds: Normal heart sounds.  Pulmonary:     Effort: No respiratory distress.     Breath sounds: No stridor. No wheezing.  Abdominal:     General: Bowel sounds are normal. There is no distension.     Palpations: Abdomen is soft. There is no mass.     Tenderness: There is no abdominal tenderness. There is no guarding or rebound.  Musculoskeletal:        General: No tenderness.     Cervical back: Normal range of motion and neck supple.  Lymphadenopathy:     Cervical: No cervical adenopathy.  Skin:    Findings: No erythema or rash.  Neurological:     Cranial Nerves: No cranial nerve deficit.     Motor: No abnormal muscle tone.     Coordination: Coordination normal.     Deep Tendon Reflexes: Reflexes normal.  Psychiatric:        Behavior: Behavior normal.        Thought Content: Thought content normal.        Judgment: Judgment normal.   R neck scar  Lab Results  Component Value Date   WBC 10.0 01/13/2019   HGB 10.2 (L) 01/13/2019   HCT 31.8 (L) 01/13/2019   PLT 329 01/13/2019   GLUCOSE 109 (H) 01/13/2019   CHOL 242 (H) 12/24/2018   TRIG 101 12/24/2018   HDL 62 12/24/2018   LDLDIRECT 132.9 04/30/2007   LDLCALC 160 (H) 12/24/2018   ALT 25 01/07/2019   AST 21 01/07/2019   NA 140 01/13/2019   K 4.5 01/13/2019   CL 114 (H) 01/13/2019   CREATININE 0.93 01/13/2019   BUN 13 01/13/2019   CO2 18 (L) 01/13/2019   TSH 5.91 (H) 09/16/2018   INR 1.0 01/07/2019   HGBA1C 5.8 (H)  12/24/2018    No results found.  Assessment & Plan:     Follow-up: No follow-ups on file.  12/26/2018, MD

## 2019-03-19 NOTE — Patient Instructions (Addendum)
  These suggestions will probably help you to improve your metabolism if you are not overweight and to lose weight if you are overweight: 1.  Reduce your consumption of sugars and starches.  Eliminate high fructose corn syrup from your diet.  Reduce your consumption of processed foods.  For desserts try to have seasonal fruits, berries, nuts, cheeses or dark chocolate with more than 70% cacao. 2.  Do not snack 3.  You do not have to eat breakfast.  If you choose to have breakfast-eat plain greek yogurt, eggs, oatmeal (without sugar) 4.  Drink water, freshly brewed unsweetened tea (green, black or herbal) or coffee.  Do not drink sodas including diet sodas , juices, beverages sweetened with artificial sweeteners. 5.  Reduce your consumption of refined grains. 6.  Avoid protein drinks such as Optifast, Slim fast etc. Eat chicken, fish, meat, dairy and beans for your sources of protein 7.  Natural unprocessed fats like cold pressed virgin olive oil, butter, coconut oil are good for you.  Eat avocados 8.  Increase your consumption of fiber.  Fruits, berries, vegetables, whole grains, flaxseeds, Chia seeds, beans, popcorn, nuts, oatmeal are good sources of fiber 9.  Use vinegar in your diet, i.e. apple cider vinegar, red wine or balsamic vinegar 10.  You can try fasting.  For example you can skip breakfast and lunch every other day (24-hour fast) 11.  Stress reduction, good night sleep, relaxation, meditation, yoga and other physical activity is likely to help you to maintain low weight too. 12.  If you drink alcohol, limit your alcohol intake to no more than 2 drinks a day.    Cabbage soup recipe that will not make you gain weight: Take 1 small head of cabbage, 1 average pack of celery, 4 green peppers, 4 onions, 2 cans diced tomatoes (they are not available without salt), salt and spices to taste.  Chop cabbage, celery, peppers and onions.  And tomatoes and 2-2.5 liters (2.5 quarts) of water so that it  would just cover the vegetables.  Bring to boil.  Add spices and salt.  Turn heat to low/medium and simmer for 20-25 minutes.  Naturally, you can make a smaller batch and change some of the ingredients.   Mederma for the scar

## 2019-03-19 NOTE — Assessment & Plan Note (Signed)
On Plavix, ASA S/p R endarterectomy 11/20

## 2019-03-19 NOTE — Addendum Note (Signed)
Addended by: Miguel Aschoff on: 03/19/2019 08:43 AM   Modules accepted: Orders

## 2019-03-19 NOTE — Assessment & Plan Note (Signed)
BMET 

## 2019-03-19 NOTE — Assessment & Plan Note (Signed)
Off iron CBC Iron tests

## 2019-03-20 ENCOUNTER — Other Ambulatory Visit: Payer: Self-pay | Admitting: Internal Medicine

## 2019-03-20 LAB — IRON,TIBC AND FERRITIN PANEL
%SAT: 13 % (calc) — ABNORMAL LOW (ref 16–45)
Ferritin: 12 ng/mL — ABNORMAL LOW (ref 16–288)
Iron: 49 ug/dL (ref 45–160)
TIBC: 381 mcg/dL (calc) (ref 250–450)

## 2019-03-20 MED ORDER — POTASSIUM CHLORIDE ER 10 MEQ PO TBCR: 10.0000 meq | EXTENDED_RELEASE_TABLET | Freq: Every day | ORAL | 3 refills | Status: DC

## 2019-03-20 MED ORDER — FERROUS SULFATE 325 (65 FE) MG PO TABS: 325.0000 mg | ORAL_TABLET | Freq: Every day | ORAL | 1 refills | Status: DC

## 2019-04-02 DIAGNOSIS — Z743 Need for continuous supervision: Secondary | ICD-10-CM | POA: Diagnosis not present

## 2019-04-02 DIAGNOSIS — H34231 Retinal artery branch occlusion, right eye: Secondary | ICD-10-CM | POA: Diagnosis not present

## 2019-04-02 DIAGNOSIS — Z0100 Encounter for examination of eyes and vision without abnormal findings: Secondary | ICD-10-CM | POA: Diagnosis not present

## 2019-04-02 DIAGNOSIS — L03119 Cellulitis of unspecified part of limb: Secondary | ICD-10-CM | POA: Diagnosis not present

## 2019-05-28 ENCOUNTER — Ambulatory Visit: Payer: Medicare HMO | Admitting: Adult Health

## 2019-05-28 ENCOUNTER — Encounter: Payer: Self-pay | Admitting: Adult Health

## 2019-05-28 ENCOUNTER — Other Ambulatory Visit: Payer: Self-pay

## 2019-05-28 VITALS — BP 139/85 | HR 82 | Temp 97.6°F | Ht 65.0 in | Wt 184.2 lb

## 2019-05-28 DIAGNOSIS — H34231 Retinal artery branch occlusion, right eye: Secondary | ICD-10-CM | POA: Diagnosis not present

## 2019-05-28 DIAGNOSIS — I10 Essential (primary) hypertension: Secondary | ICD-10-CM

## 2019-05-28 DIAGNOSIS — E785 Hyperlipidemia, unspecified: Secondary | ICD-10-CM | POA: Diagnosis not present

## 2019-05-28 DIAGNOSIS — I6521 Occlusion and stenosis of right carotid artery: Secondary | ICD-10-CM

## 2019-05-28 DIAGNOSIS — Z9889 Other specified postprocedural states: Secondary | ICD-10-CM | POA: Diagnosis not present

## 2019-05-28 NOTE — Patient Instructions (Signed)
Continue aspirin 325 mg daily and clopidogrel 75 mg daily  and Zetia for secondary stroke prevention  Ongoing use of DAPT not indicated from a neurological standpoint and only requires single agent alone for secondary stroke prevention.  Please follow-up with Dr. Arbie Cookey in regards to ongoing need of both aspirin and plavix   Continue to follow up with PCP regarding cholesterol and blood pressure management   Continue to follow with ophthalmology routinely  Continue to monitor blood pressure at home  Maintain strict control of hypertension with blood pressure goal below 130/90, diabetes with hemoglobin A1c goal below 6.5% and cholesterol with LDL cholesterol (bad cholesterol) goal below 70 mg/dL. I also advised the patient to eat a healthy diet with plenty of whole grains, cereals, fruits and vegetables, exercise regularly and maintain ideal body weight.         Thank you for coming to see [place] at [facility]. I hope we have been able to provide you high quality care today.  You may receive a patient satisfaction survey over the next few weeks. We would appreciate your feedback and comments so that we may continue to improve ourselves and the health of our patients.
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
## 2019-06-05 NOTE — Progress Notes (Signed)
I agree with the above plan 

## 2019-06-17 ENCOUNTER — Emergency Department (HOSPITAL_COMMUNITY): Payer: Medicare HMO

## 2019-06-17 ENCOUNTER — Encounter (HOSPITAL_COMMUNITY): Payer: Self-pay | Admitting: Emergency Medicine

## 2019-06-17 ENCOUNTER — Emergency Department (HOSPITAL_COMMUNITY)
Admission: EM | Admit: 2019-06-17 | Discharge: 2019-06-17 | Disposition: A | Discharge status: Home / Self Care | Payer: Medicare HMO | Attending: Emergency Medicine | Admitting: Emergency Medicine

## 2019-06-17 ENCOUNTER — Other Ambulatory Visit: Payer: Self-pay

## 2019-06-17 DIAGNOSIS — R079 Chest pain, unspecified: Secondary | ICD-10-CM | POA: Diagnosis not present

## 2019-06-17 DIAGNOSIS — R000 Tachycardia, unspecified: Secondary | ICD-10-CM | POA: Diagnosis present

## 2019-06-17 LAB — CBC
HCT: 36.2 % (ref 36.0–46.0)
Hemoglobin: 11.4 g/dL — ABNORMAL LOW (ref 12.0–15.0)
MCH: 27.9 pg (ref 26.0–34.0)
MCHC: 31.5 g/dL (ref 30.0–36.0)
MCV: 88.7 fL (ref 80.0–100.0)
Platelets: 362 10*3/uL (ref 150–400)
RBC: 4.08 MIL/uL (ref 3.87–5.11)
RDW: 16.8 % — ABNORMAL HIGH (ref 11.5–15.5)
WBC: 6.1 10*3/uL (ref 4.0–10.5)
nRBC: 0 % (ref 0.0–0.2)

## 2019-06-17 LAB — BASIC METABOLIC PANEL
Anion gap: 11 (ref 5–15)
BUN: 14 mg/dL (ref 8–23)
CO2: 22 mmol/L (ref 22–32)
Calcium: 9.5 mg/dL (ref 8.9–10.3)
Chloride: 109 mmol/L (ref 98–111)
Creatinine, Ser: 1.05 mg/dL — ABNORMAL HIGH (ref 0.44–1.00)
GFR calc Af Amer: >60 mL/min (ref >60)
GFR calc non Af Amer: 55 mL/min — ABNORMAL LOW (ref >60)
Glucose, Bld: 107 mg/dL — ABNORMAL HIGH (ref 70–99)
Potassium: 3.6 mmol/L (ref 3.5–5.1)
Sodium: 142 mmol/L (ref 135–145)

## 2019-06-17 LAB — TROPONIN I (HIGH SENSITIVITY)
Troponin I (High Sensitivity): 8 ng/L (ref <18)
Troponin I (High Sensitivity): 11 ng/L (ref <18)

## 2019-06-17 MED ORDER — SODIUM CHLORIDE 0.9% FLUSH: 3.0000 mL | Freq: Once | INTRAVENOUS | Status: DC

## 2019-06-17 NOTE — Discharge Instructions (Addendum)
Continue your current medications.  Follow up with your primary care doctor and consider seeing your cardiologist.  Return to the ED for worsening or recurrent symptoms
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
## 2019-06-17 NOTE — ED Triage Notes (Signed)
Pt c/o feeling like her heart is racing, mild headache, and an episode of chest pain. States symptoms have been intermittent x 3-4 days. Also reports that left extremity weakness started after arrival to ED> Moves all extremities well, no weakness noted.

## 2019-07-01 DIAGNOSIS — Z961 Presence of intraocular lens: Secondary | ICD-10-CM | POA: Diagnosis not present

## 2019-07-01 DIAGNOSIS — H34231 Retinal artery branch occlusion, right eye: Secondary | ICD-10-CM | POA: Diagnosis not present

## 2019-07-01 DIAGNOSIS — H2512 Age-related nuclear cataract, left eye: Secondary | ICD-10-CM | POA: Diagnosis not present

## 2019-07-01 DIAGNOSIS — H1045 Other chronic allergic conjunctivitis: Secondary | ICD-10-CM | POA: Diagnosis not present

## 2019-07-10 ENCOUNTER — Telehealth: Payer: Self-pay | Admitting: Internal Medicine

## 2019-07-10 NOTE — Telephone Encounter (Signed)
New message:  Sending for documentation of call:  Pt called wanted to be sent to triage nurse for dizziness and lightheadedness. Pt states she feels as if her BP is low. I asked the pt to take her BP so that I can give it to the triage nurse when I called. During this process she said she feels like she was getting ready to pass out and then the pt's husband said she has passed out. I immediately told the patient's husband to call 911. He said okay and we ended the call.

## 2019-07-13 NOTE — Telephone Encounter (Signed)
noted 

## 2019-07-23 ENCOUNTER — Other Ambulatory Visit: Payer: Self-pay | Admitting: Vascular Surgery

## 2019-07-23 DIAGNOSIS — G459 Transient cerebral ischemic attack, unspecified: Secondary | ICD-10-CM
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
## 2019-08-05 ENCOUNTER — Encounter (INDEPENDENT_AMBULATORY_CARE_PROVIDER_SITE_OTHER): Payer: Self-pay | Admitting: Ophthalmology

## 2019-08-05 ENCOUNTER — Ambulatory Visit (INDEPENDENT_AMBULATORY_CARE_PROVIDER_SITE_OTHER): Payer: Medicare HMO | Admitting: Ophthalmology

## 2019-08-05 ENCOUNTER — Other Ambulatory Visit: Payer: Self-pay

## 2019-08-05 DIAGNOSIS — H3581 Retinal edema: Secondary | ICD-10-CM

## 2019-08-05 DIAGNOSIS — H34231 Retinal artery branch occlusion, right eye: Secondary | ICD-10-CM

## 2019-08-05 DIAGNOSIS — I10 Essential (primary) hypertension: Secondary | ICD-10-CM

## 2019-08-05 DIAGNOSIS — Z961 Presence of intraocular lens: Secondary | ICD-10-CM | POA: Diagnosis not present

## 2019-08-05 DIAGNOSIS — H35033 Hypertensive retinopathy, bilateral: Secondary | ICD-10-CM | POA: Diagnosis not present

## 2019-08-05 DIAGNOSIS — H25812 Combined forms of age-related cataract, left eye: Secondary | ICD-10-CM

## 2019-08-25 ENCOUNTER — Other Ambulatory Visit: Payer: Self-pay | Admitting: Vascular Surgery

## 2019-08-25 DIAGNOSIS — G459 Transient cerebral ischemic attack, unspecified: Secondary | ICD-10-CM

## 2019-09-22 ENCOUNTER — Other Ambulatory Visit: Payer: Self-pay | Admitting: Vascular Surgery

## 2019-09-22 ENCOUNTER — Other Ambulatory Visit: Payer: Self-pay | Admitting: Internal Medicine

## 2019-09-22 DIAGNOSIS — G459 Transient cerebral ischemic attack, unspecified: Secondary | ICD-10-CM

## 2019-09-27 ENCOUNTER — Other Ambulatory Visit: Payer: Self-pay | Admitting: Vascular Surgery

## 2019-09-27 DIAGNOSIS — G459 Transient cerebral ischemic attack, unspecified: Secondary | ICD-10-CM

## 2019-10-29 ENCOUNTER — Other Ambulatory Visit: Payer: Self-pay | Admitting: Vascular Surgery

## 2019-10-29 DIAGNOSIS — G459 Transient cerebral ischemic attack, unspecified: Secondary | ICD-10-CM

## 2019-12-02 ENCOUNTER — Other Ambulatory Visit: Payer: Self-pay | Admitting: Vascular Surgery

## 2019-12-02 DIAGNOSIS — G459 Transient cerebral ischemic attack, unspecified: Secondary | ICD-10-CM

## 2019-12-17 ENCOUNTER — Other Ambulatory Visit: Payer: Self-pay

## 2019-12-17 ENCOUNTER — Other Ambulatory Visit: Payer: Self-pay | Admitting: *Deleted

## 2019-12-17 DIAGNOSIS — G459 Transient cerebral ischemic attack, unspecified: Secondary | ICD-10-CM

## 2019-12-17 MED ORDER — FUROSEMIDE 40 MG PO TABS: 40.0000 mg | ORAL_TABLET | Freq: Every day | ORAL | 0 refills | Status: DC | PRN

## 2019-12-17 MED ORDER — CLOPIDOGREL BISULFATE 75 MG PO TABS: 75.0000 mg | ORAL_TABLET | Freq: Every day | ORAL | 1 refills | Status: DC

## 2019-12-17 MED ORDER — METOPROLOL SUCCINATE ER 100 MG PO TB24: ORAL_TABLET | ORAL | 0 refills | Status: DC

## 2019-12-17 MED ORDER — AMLODIPINE BESYLATE 5 MG PO TABS: 5.0000 mg | ORAL_TABLET | Freq: Every day | ORAL | 0 refills | Status: DC

## 2019-12-18 MED ORDER — METOPROLOL SUCCINATE ER 100 MG PO TB24: ORAL_TABLET | ORAL | 0 refills | Status: DC

## 2019-12-18 NOTE — Addendum Note (Signed)
Addended by: Deatra James on: 12/18/2019 03:38 PM   Modules accepted: Orders

## 2019-12-25 ENCOUNTER — Other Ambulatory Visit: Payer: Self-pay | Admitting: *Deleted

## 2019-12-25 DIAGNOSIS — G459 Transient cerebral ischemic attack, unspecified: Secondary | ICD-10-CM

## 2019-12-25 MED ORDER — CLOPIDOGREL BISULFATE 75 MG PO TABS: 75.0000 mg | ORAL_TABLET | Freq: Every day | ORAL | 1 refills | Status: DC

## 2019-12-28 ENCOUNTER — Other Ambulatory Visit: Payer: Self-pay | Admitting: Internal Medicine

## 2019-12-28 ENCOUNTER — Other Ambulatory Visit: Payer: Self-pay

## 2019-12-28 DIAGNOSIS — G459 Transient cerebral ischemic attack, unspecified: Secondary | ICD-10-CM

## 2019-12-28 DIAGNOSIS — Z1231 Encounter for screening mammogram for malignant neoplasm of breast: Secondary | ICD-10-CM

## 2019-12-28 MED ORDER — EZETIMIBE 10 MG PO TABS: 10.0000 mg | ORAL_TABLET | Freq: Every day | ORAL | 0 refills | Status: DC

## 2019-12-29 ENCOUNTER — Other Ambulatory Visit: Payer: Self-pay | Admitting: *Deleted

## 2019-12-29 DIAGNOSIS — I6521 Occlusion and stenosis of right carotid artery: Secondary | ICD-10-CM

## 2020-01-12 ENCOUNTER — Ambulatory Visit (HOSPITAL_COMMUNITY)
Admission: RE | Admit: 2020-01-12 | Discharge: 2020-01-12 | Disposition: A | Discharge status: Home / Self Care | Payer: Medicare PPO | Source: Ambulatory Visit | Attending: Vascular Surgery | Admitting: Vascular Surgery

## 2020-01-12 ENCOUNTER — Ambulatory Visit: Payer: Medicare PPO | Admitting: Vascular Surgery

## 2020-01-12 ENCOUNTER — Other Ambulatory Visit: Payer: Self-pay

## 2020-01-12 ENCOUNTER — Encounter: Payer: Self-pay | Admitting: Vascular Surgery

## 2020-01-12 VITALS — BP 136/76 | HR 62 | Temp 97.2°F | Resp 20 | Ht 65.0 in | Wt 181.0 lb

## 2020-01-12 DIAGNOSIS — Z9889 Other specified postprocedural states: Secondary | ICD-10-CM

## 2020-01-12 DIAGNOSIS — I6521 Occlusion and stenosis of right carotid artery: Secondary | ICD-10-CM

## 2020-01-12 NOTE — Progress Notes (Signed)
   Patient name: Judy Carlson MRN: [id number] DOB: 12/28/1950 Sex: female  REASON FOR VISIT: Follow-up right carotid endarterectomy on 01/12/2019  HPI: Judy Carlson is a 69 y.o. female here today for follow-up.  She underwent uneventful endarterectomy for symptomatic disease.  She was discharged home on postoperative day 1.  She reports no new neurologic deficits.  She does report the usual periincisional numbness and also some numbness of her right earlobe. This has improved. No new neurologic symptoms.  Current Outpatient Medications  Medication Sig Dispense Refill  . acetaminophen (TYLENOL) 650 MG CR tablet Take 1,300 mg by mouth as needed for pain.     Marland Kitchen amLODipine (NORVASC) 5 MG tablet Take 1 tablet (5 mg total) by mouth daily. Annual appt due in January must see provider for future refills 90 tablet 0  . aspirin EC 325 MG EC tablet Take 1 tablet (325 mg total) by mouth daily. 30 tablet 0  . Cholecalciferol (VITAMIN D) 2000 UNITS CAPS Take 2,000 Units by mouth daily.    . clopidogrel (PLAVIX) 75 MG tablet Take 1 tablet (75 mg total) by mouth daily. 90 tablet 1  . ezetimibe (ZETIA) 10 MG tablet Take 1 tablet (10 mg total) by mouth daily. 30 tablet 0  . furosemide (LASIX) 40 MG tablet Take 1 tablet (40 mg total) by mouth daily as needed for fluid. 90 tablet 0  . meloxicam (MOBIC) 7.5 MG tablet Take 7.5 mg by mouth as needed for pain.     . metoprolol succinate (TOPROL-XL) 100 MG 24 hr tablet TAKE ONE TABLET BY MOUTH ONCE DAILY WITH OR IMMEDIATELY FOLLOWING A MEAL. 90 tablet 0  . Omega-3 Fatty Acids (FISH OIL PO) Take 1 capsule by mouth daily.     . potassium chloride (KLOR-CON) 10 MEQ tablet Take 1 tablet (10 mEq total) by mouth daily. 90 tablet 3   No current facility-administered medications for this visit.     PHYSICAL EXAM: Vitals:   01/12/20 1314 01/12/20 1317  BP: 131/72 136/76  Pulse: 62   Resp: 20   Temp: (!) 97.2 F (36.2 C)   SpO2:  96%   Weight: 181 lb (82.1 kg)   Height: 5\' 5"  (1.651 m)     GENERAL: The patient is a well-nourished female, in no acute distress. The vital signs are documented above. Well-healed right neck incision with the usual healing ridge.  No carotid bruits bilaterally. Neurologically intact   Duplex ultrasound 01/12/20: Personally reviewed Area of tortuousity in RICA gives falsely elevated velocity Otherwise normal study (copied below)    MEDICAL ISSUES: Stable postop right carotid endarterectomy for symptomatic disease.  Will continue full activity and will see 13/9/21 again in 12 months with repeat carotid duplex   US. Rande Brunt, MD [facility] Phone Number: [phone] 01/12/2020 1:43 PM

## 2020-01-21 ENCOUNTER — Other Ambulatory Visit: Payer: Self-pay | Admitting: Vascular Surgery

## 2020-01-21 DIAGNOSIS — G459 Transient cerebral ischemic attack, unspecified: Secondary | ICD-10-CM

## 2020-02-03 ENCOUNTER — Other Ambulatory Visit: Payer: Self-pay

## 2020-02-03 ENCOUNTER — Ambulatory Visit
Admission: RE | Admit: 2020-02-03 | Discharge: 2020-02-03 | Disposition: A | Discharge status: Home / Self Care | Payer: Medicare PPO | Source: Ambulatory Visit | Attending: Internal Medicine | Admitting: Internal Medicine

## 2020-02-03 DIAGNOSIS — Z1231 Encounter for screening mammogram for malignant neoplasm of breast: Secondary | ICD-10-CM

## 2020-02-09 ENCOUNTER — Ambulatory Visit: Payer: Medicare PPO | Attending: Internal Medicine

## 2020-02-09 DIAGNOSIS — Z23 Encounter for immunization: Secondary | ICD-10-CM

## 2020-02-09 NOTE — Progress Notes (Signed)
   Covid-19 Vaccination Clinic  Name:  Judy Carlson    MRN: [id number] DOB: 11/09/1950  02/09/2020  Judy Carlson was observed post Covid-19 immunization for 15 minutes without incident. She was provided with Vaccine Information Sheet and instruction to access the V-Safe system.   Judy Carlson was instructed to call 911 with any severe reactions post vaccine: Marland Kitchen Difficulty breathing  . Swelling of face and throat  . A fast heartbeat  . A bad rash all over body  . Dizziness and weakness   Immunizations Administered    Name Date Dose VIS Date Route   Pfizer COVID-19 Vaccine 02/09/2020  1:47 PM 0.3 mL 12/23/2019 Intramuscular   Manufacturer: [employer], [employer]   Lot: [id number]   NDC: [address]-9

## 2020-02-16 ENCOUNTER — Other Ambulatory Visit: Payer: Self-pay | Admitting: Internal Medicine

## 2020-04-06 ENCOUNTER — Other Ambulatory Visit: Payer: Self-pay

## 2020-04-06 DIAGNOSIS — G459 Transient cerebral ischemic attack, unspecified: Secondary | ICD-10-CM

## 2020-04-06 MED ORDER — EZETIMIBE 10 MG PO TABS: 10.0000 mg | ORAL_TABLET | Freq: Every day | ORAL | 0 refills | Status: DC

## 2020-04-07 ENCOUNTER — Other Ambulatory Visit: Payer: Self-pay

## 2020-04-07 DIAGNOSIS — G459 Transient cerebral ischemic attack, unspecified: Secondary | ICD-10-CM

## 2020-04-07 MED ORDER — EZETIMIBE 10 MG PO TABS: 10.0000 mg | ORAL_TABLET | Freq: Every day | ORAL | 3 refills | Status: DC

## 2020-04-21 ENCOUNTER — Other Ambulatory Visit: Payer: Self-pay | Admitting: *Deleted

## 2020-04-21 MED ORDER — POTASSIUM CHLORIDE ER 10 MEQ PO TBCR: 10.0000 meq | EXTENDED_RELEASE_TABLET | Freq: Every day | ORAL | 0 refills | Status: DC

## 2020-05-02 ENCOUNTER — Other Ambulatory Visit: Payer: Self-pay | Admitting: Internal Medicine

## 2020-06-09 ENCOUNTER — Other Ambulatory Visit: Payer: Self-pay | Admitting: Vascular Surgery

## 2020-06-09 DIAGNOSIS — G459 Transient cerebral ischemic attack, unspecified: Secondary | ICD-10-CM

## 2020-06-28 ENCOUNTER — Other Ambulatory Visit: Payer: Self-pay | Admitting: Internal Medicine

## 2020-07-04 ENCOUNTER — Inpatient Hospital Stay (HOSPITAL_COMMUNITY)
Admission: EM | Admit: 2020-07-04 | Discharge: 2020-07-07 | DRG: 378 | Disposition: A | Discharge status: Home / Self Care | Payer: Medicare PPO | Attending: Family Medicine | Admitting: Family Medicine

## 2020-07-04 ENCOUNTER — Encounter (HOSPITAL_COMMUNITY): Payer: Self-pay | Admitting: Internal Medicine

## 2020-07-04 ENCOUNTER — Other Ambulatory Visit: Payer: Self-pay

## 2020-07-04 DIAGNOSIS — B9681 Helicobacter pylori [H. pylori] as the cause of diseases classified elsewhere: Secondary | ICD-10-CM

## 2020-07-04 DIAGNOSIS — K2970 Gastritis, unspecified, without bleeding: Secondary | ICD-10-CM | POA: Diagnosis not present

## 2020-07-04 DIAGNOSIS — K269 Duodenal ulcer, unspecified as acute or chronic, without hemorrhage or perforation: Secondary | ICD-10-CM

## 2020-07-04 DIAGNOSIS — Z7902 Long term (current) use of antithrombotics/antiplatelets: Secondary | ICD-10-CM

## 2020-07-04 DIAGNOSIS — I7300 Raynaud's syndrome without gangrene: Secondary | ICD-10-CM | POA: Diagnosis present

## 2020-07-04 DIAGNOSIS — Z888 Allergy status to other drugs, medicaments and biological substances status: Secondary | ICD-10-CM

## 2020-07-04 DIAGNOSIS — K279 Peptic ulcer, site unspecified, unspecified as acute or chronic, without hemorrhage or perforation: Secondary | ICD-10-CM

## 2020-07-04 DIAGNOSIS — Z20822 Contact with and (suspected) exposure to covid-19: Secondary | ICD-10-CM | POA: Diagnosis present

## 2020-07-04 DIAGNOSIS — Z79899 Other long term (current) drug therapy: Secondary | ICD-10-CM

## 2020-07-04 DIAGNOSIS — K635 Polyp of colon: Secondary | ICD-10-CM | POA: Diagnosis present

## 2020-07-04 DIAGNOSIS — K219 Gastro-esophageal reflux disease without esophagitis: Secondary | ICD-10-CM | POA: Diagnosis present

## 2020-07-04 DIAGNOSIS — N1831 Chronic kidney disease, stage 3a: Secondary | ICD-10-CM | POA: Diagnosis present

## 2020-07-04 DIAGNOSIS — R7309 Other abnormal glucose: Secondary | ICD-10-CM | POA: Diagnosis not present

## 2020-07-04 DIAGNOSIS — D509 Iron deficiency anemia, unspecified: Secondary | ICD-10-CM | POA: Diagnosis not present

## 2020-07-04 DIAGNOSIS — Z881 Allergy status to other antibiotic agents status: Secondary | ICD-10-CM

## 2020-07-04 DIAGNOSIS — K259 Gastric ulcer, unspecified as acute or chronic, without hemorrhage or perforation: Secondary | ICD-10-CM

## 2020-07-04 DIAGNOSIS — Z791 Long term (current) use of non-steroidal anti-inflammatories (NSAID): Secondary | ICD-10-CM

## 2020-07-04 DIAGNOSIS — K2980 Duodenitis without bleeding: Secondary | ICD-10-CM | POA: Diagnosis not present

## 2020-07-04 DIAGNOSIS — D500 Iron deficiency anemia secondary to blood loss (chronic): Secondary | ICD-10-CM | POA: Diagnosis not present

## 2020-07-04 DIAGNOSIS — K264 Chronic or unspecified duodenal ulcer with hemorrhage: Secondary | ICD-10-CM | POA: Diagnosis present

## 2020-07-04 DIAGNOSIS — Z8673 Personal history of transient ischemic attack (TIA), and cerebral infarction without residual deficits: Secondary | ICD-10-CM

## 2020-07-04 DIAGNOSIS — K922 Gastrointestinal hemorrhage, unspecified: Secondary | ICD-10-CM | POA: Diagnosis present

## 2020-07-04 DIAGNOSIS — I6529 Occlusion and stenosis of unspecified carotid artery: Secondary | ICD-10-CM | POA: Diagnosis present

## 2020-07-04 DIAGNOSIS — K621 Rectal polyp: Secondary | ICD-10-CM | POA: Diagnosis present

## 2020-07-04 DIAGNOSIS — K254 Chronic or unspecified gastric ulcer with hemorrhage: Principal | ICD-10-CM | POA: Diagnosis present

## 2020-07-04 DIAGNOSIS — R17 Unspecified jaundice: Secondary | ICD-10-CM

## 2020-07-04 DIAGNOSIS — K2981 Duodenitis with bleeding: Secondary | ICD-10-CM | POA: Diagnosis present

## 2020-07-04 DIAGNOSIS — K2971 Gastritis, unspecified, with bleeding: Secondary | ICD-10-CM | POA: Diagnosis present

## 2020-07-04 DIAGNOSIS — I129 Hypertensive chronic kidney disease with stage 1 through stage 4 chronic kidney disease, or unspecified chronic kidney disease: Secondary | ICD-10-CM | POA: Diagnosis present

## 2020-07-04 DIAGNOSIS — F17210 Nicotine dependence, cigarettes, uncomplicated: Secondary | ICD-10-CM | POA: Diagnosis present

## 2020-07-04 DIAGNOSIS — Z66 Do not resuscitate: Secondary | ICD-10-CM | POA: Diagnosis present

## 2020-07-04 DIAGNOSIS — E538 Deficiency of other specified B group vitamins: Secondary | ICD-10-CM | POA: Diagnosis present

## 2020-07-04 DIAGNOSIS — I10 Essential (primary) hypertension: Secondary | ICD-10-CM | POA: Diagnosis present

## 2020-07-04 DIAGNOSIS — E739 Lactose intolerance, unspecified: Secondary | ICD-10-CM | POA: Diagnosis present

## 2020-07-04 DIAGNOSIS — E785 Hyperlipidemia, unspecified: Secondary | ICD-10-CM | POA: Diagnosis present

## 2020-07-04 DIAGNOSIS — K648 Other hemorrhoids: Secondary | ICD-10-CM | POA: Diagnosis present

## 2020-07-04 DIAGNOSIS — D62 Acute posthemorrhagic anemia: Secondary | ICD-10-CM | POA: Diagnosis present

## 2020-07-04 DIAGNOSIS — K5731 Diverticulosis of large intestine without perforation or abscess with bleeding: Secondary | ICD-10-CM | POA: Diagnosis present

## 2020-07-04 DIAGNOSIS — R7302 Impaired glucose tolerance (oral): Secondary | ICD-10-CM | POA: Diagnosis present

## 2020-07-04 DIAGNOSIS — I6521 Occlusion and stenosis of right carotid artery: Secondary | ICD-10-CM | POA: Diagnosis not present

## 2020-07-04 DIAGNOSIS — I739 Peripheral vascular disease, unspecified: Secondary | ICD-10-CM | POA: Diagnosis not present

## 2020-07-04 DIAGNOSIS — G4700 Insomnia, unspecified: Secondary | ICD-10-CM | POA: Diagnosis present

## 2020-07-04 DIAGNOSIS — Z886 Allergy status to analgesic agent status: Secondary | ICD-10-CM | POA: Diagnosis not present

## 2020-07-04 DIAGNOSIS — D649 Anemia, unspecified: Secondary | ICD-10-CM | POA: Diagnosis not present

## 2020-07-04 DIAGNOSIS — Z7982 Long term (current) use of aspirin: Secondary | ICD-10-CM

## 2020-07-04 DIAGNOSIS — N183 Chronic kidney disease, stage 3 unspecified: Secondary | ICD-10-CM | POA: Diagnosis present

## 2020-07-04 DIAGNOSIS — K5730 Diverticulosis of large intestine without perforation or abscess without bleeding: Secondary | ICD-10-CM

## 2020-07-04 HISTORY — DX: Do not resuscitate: Z66

## 2020-07-04 LAB — COMPREHENSIVE METABOLIC PANEL
ALT: 19 U/L (ref 0–44)
AST: 19 U/L (ref 15–41)
Albumin: 3.8 g/dL (ref 3.5–5.0)
Alkaline Phosphatase: 79 U/L (ref 38–126)
Anion gap: 13 (ref 5–15)
BUN: 14 mg/dL (ref 8–23)
CO2: 21 mmol/L — ABNORMAL LOW (ref 22–32)
Calcium: 9.9 mg/dL (ref 8.9–10.3)
Chloride: 108 mmol/L (ref 98–111)
Creatinine, Ser: 1.23 mg/dL — ABNORMAL HIGH (ref 0.44–1.00)
GFR, Estimated: 47 mL/min — ABNORMAL LOW (ref >60)
Glucose, Bld: 130 mg/dL — ABNORMAL HIGH (ref 70–99)
Potassium: 3.8 mmol/L (ref 3.5–5.1)
Sodium: 142 mmol/L (ref 135–145)
Total Bilirubin: 0.4 mg/dL (ref 0.3–1.2)
Total Protein: 7.1 g/dL (ref 6.5–8.1)

## 2020-07-04 LAB — RESP PANEL BY RT-PCR (FLU A&B, COVID) ARPGX2
Influenza A by PCR: NEGATIVE
Influenza B by PCR: NEGATIVE
SARS Coronavirus 2 by RT PCR: NEGATIVE

## 2020-07-04 LAB — HIV ANTIBODY (ROUTINE TESTING W REFLEX): HIV Screen 4th Generation wRfx: NONREACTIVE

## 2020-07-04 LAB — PREPARE RBC (CROSSMATCH)

## 2020-07-04 LAB — CBC
HCT: 23.7 % — ABNORMAL LOW (ref 36.0–46.0)
Hemoglobin: 6.7 g/dL — CL (ref 12.0–15.0)
MCH: 19.9 pg — ABNORMAL LOW (ref 26.0–34.0)
MCHC: 28.3 g/dL — ABNORMAL LOW (ref 30.0–36.0)
MCV: 70.3 fL — ABNORMAL LOW (ref 80.0–100.0)
Platelets: 508 10*3/uL — ABNORMAL HIGH (ref 150–400)
RBC: 3.37 MIL/uL — ABNORMAL LOW (ref 3.87–5.11)
RDW: 19.7 % — ABNORMAL HIGH (ref 11.5–15.5)
WBC: 8.4 10*3/uL (ref 4.0–10.5)
nRBC: 0 % (ref 0.0–0.2)

## 2020-07-04 LAB — LIPASE, BLOOD: Lipase: 30 U/L (ref 11–51)

## 2020-07-04 LAB — POC OCCULT BLOOD, ED: Fecal Occult Bld: POSITIVE — AB

## 2020-07-04 MED ORDER — MORPHINE SULFATE (PF) 2 MG/ML IV SOLN: 2.0000 mg | INTRAVENOUS | Status: DC | PRN

## 2020-07-04 MED ORDER — PANTOPRAZOLE SODIUM 40 MG PO TBEC
40.0000 mg | DELAYED_RELEASE_TABLET | Freq: Two times a day (BID) | ORAL | Status: DC
  Administered 2020-07-05 – 2020-07-07 (×5): 40 mg via ORAL
  Filled 2020-07-04 (×5): qty 1

## 2020-07-04 MED ORDER — LACTATED RINGERS IV SOLN: INTRAVENOUS | Status: DC

## 2020-07-04 MED ORDER — ONDANSETRON HCL 4 MG PO TABS: 4.0000 mg | ORAL_TABLET | Freq: Four times a day (QID) | ORAL | Status: DC | PRN

## 2020-07-04 MED ORDER — SODIUM CHLORIDE 0.9 % IV SOLN: 10.0000 mL/h | Freq: Once | INTRAVENOUS | Status: DC

## 2020-07-04 MED ORDER — HYDRALAZINE HCL 20 MG/ML IJ SOLN: 5.0000 mg | INTRAMUSCULAR | Status: DC | PRN

## 2020-07-04 MED ORDER — PEG-KCL-NACL-NASULF-NA ASC-C 100 G PO SOLR
0.5000 | Freq: Once | ORAL | Status: DC
  Filled 2020-07-04: qty 1

## 2020-07-04 MED ORDER — ACETAMINOPHEN 325 MG PO TABS
650.0000 mg | ORAL_TABLET | Freq: Four times a day (QID) | ORAL | Status: DC | PRN
  Administered 2020-07-05 – 2020-07-07 (×3): 650 mg via ORAL
  Filled 2020-07-04 (×3): qty 2

## 2020-07-04 MED ORDER — METOPROLOL SUCCINATE ER 100 MG PO TB24
100.0000 mg | ORAL_TABLET | Freq: Every day | ORAL | Status: DC
  Administered 2020-07-05 – 2020-07-07 (×3): 100 mg via ORAL
  Filled 2020-07-04 (×3): qty 1

## 2020-07-04 MED ORDER — ONDANSETRON HCL 4 MG/2ML IJ SOLN: 4.0000 mg | Freq: Four times a day (QID) | INTRAMUSCULAR | Status: DC | PRN

## 2020-07-04 MED ORDER — PANTOPRAZOLE SODIUM 40 MG IV SOLR
40.0000 mg | Freq: Once | INTRAVENOUS | Status: AC
  Administered 2020-07-05: 40 mg via INTRAVENOUS
  Filled 2020-07-04: qty 40

## 2020-07-04 MED ORDER — ACETAMINOPHEN 650 MG RE SUPP: 650.0000 mg | Freq: Four times a day (QID) | RECTAL | Status: DC | PRN

## 2020-07-04 MED ORDER — AMLODIPINE BESYLATE 5 MG PO TABS
5.0000 mg | ORAL_TABLET | Freq: Every day | ORAL | Status: DC
  Administered 2020-07-05 – 2020-07-07 (×3): 5 mg via ORAL
  Filled 2020-07-04 (×3): qty 1

## 2020-07-04 MED ORDER — PEG-KCL-NACL-NASULF-NA ASC-C 100 G PO SOLR: 1.0000 | Freq: Once | ORAL | Status: DC

## 2020-07-04 MED ORDER — BISACODYL 5 MG PO TBEC: 20.0000 mg | DELAYED_RELEASE_TABLET | Freq: Once | ORAL | Status: DC
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
## 2020-07-04 NOTE — ED Triage Notes (Signed)
Pt arrived via [facility] with c/c of . Pt is from eye doctor where she was having anxiety attach. Pt was breathing fast with numbness in arms. Pt states this has happened for last 2 weeks with dizziness, N/V, shOB, and pain in left arm.   166/96, 100%RA, 76HR, BS 165
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
## 2020-07-05 ENCOUNTER — Encounter: Payer: Medicare PPO | Admitting: Internal Medicine

## 2020-07-05 DIAGNOSIS — D500 Iron deficiency anemia secondary to blood loss (chronic): Secondary | ICD-10-CM

## 2020-07-05 DIAGNOSIS — D649 Anemia, unspecified: Secondary | ICD-10-CM

## 2020-07-05 DIAGNOSIS — I6521 Occlusion and stenosis of right carotid artery: Secondary | ICD-10-CM

## 2020-07-05 LAB — CBC
HCT: 29.7 % — ABNORMAL LOW (ref 36.0–46.0)
HCT: 30.4 % — ABNORMAL LOW (ref 36.0–46.0)
Hemoglobin: 9.3 g/dL — ABNORMAL LOW (ref 12.0–15.0)
Hemoglobin: 9.3 g/dL — ABNORMAL LOW (ref 12.0–15.0)
MCH: 23.7 pg — ABNORMAL LOW (ref 26.0–34.0)
MCH: 23.2 pg — ABNORMAL LOW (ref 26.0–34.0)
MCHC: 31.3 g/dL (ref 30.0–36.0)
MCHC: 30.6 g/dL (ref 30.0–36.0)
MCV: 75.6 fL — ABNORMAL LOW (ref 80.0–100.0)
MCV: 75.8 fL — ABNORMAL LOW (ref 80.0–100.0)
Platelets: 391 10*3/uL (ref 150–400)
Platelets: 359 10*3/uL (ref 150–400)
RBC: 3.93 MIL/uL (ref 3.87–5.11)
RBC: 4.01 MIL/uL (ref 3.87–5.11)
RDW: 21.6 % — ABNORMAL HIGH (ref 11.5–15.5)
RDW: 21.9 % — ABNORMAL HIGH (ref 11.5–15.5)
WBC: 7.2 10*3/uL (ref 4.0–10.5)
WBC: 6.5 10*3/uL (ref 4.0–10.5)
nRBC: 0 % (ref 0.0–0.2)
nRBC: 0 % (ref 0.0–0.2)

## 2020-07-05 LAB — BASIC METABOLIC PANEL
Anion gap: 9 (ref 5–15)
BUN: 11 mg/dL (ref 8–23)
CO2: 24 mmol/L (ref 22–32)
Calcium: 9.2 mg/dL (ref 8.9–10.3)
Chloride: 106 mmol/L (ref 98–111)
Creatinine, Ser: 1.11 mg/dL — ABNORMAL HIGH (ref 0.44–1.00)
GFR, Estimated: 53 mL/min — ABNORMAL LOW (ref >60)
Glucose, Bld: 129 mg/dL — ABNORMAL HIGH (ref 70–99)
Potassium: 3.4 mmol/L — ABNORMAL LOW (ref 3.5–5.1)
Sodium: 139 mmol/L (ref 135–145)

## 2020-07-05 LAB — TYPE AND SCREEN
ABO/RH(D): A POS
Antibody Screen: NEGATIVE
Unit division: 0
Unit division: 0

## 2020-07-05 LAB — BPAM RBC
Blood Product Expiration Date: 202205262359
Blood Product Expiration Date: 202205262359
ISSUE DATE / TIME: 202205021717
ISSUE DATE / TIME: 202205021932
Unit Type and Rh: 6200
Unit Type and Rh: 6200

## 2020-07-05 MED ORDER — BISACODYL 5 MG PO TBEC
20.0000 mg | DELAYED_RELEASE_TABLET | Freq: Once | ORAL | Status: AC
  Administered 2020-07-06: 20 mg via ORAL
  Filled 2020-07-05: qty 4

## 2020-07-05 MED ORDER — PEG-KCL-NACL-NASULF-NA ASC-C 100 G PO SOLR
0.5000 | Freq: Once | ORAL | Status: AC
  Administered 2020-07-06: 100 g via ORAL

## 2020-07-05 MED ORDER — PEG-KCL-NACL-NASULF-NA ASC-C 100 G PO SOLR: 1.0000 | Freq: Once | ORAL | Status: DC

## 2020-07-05 MED ORDER — PEG-KCL-NACL-NASULF-NA ASC-C 100 G PO SOLR
0.5000 | Freq: Once | ORAL | Status: DC
  Filled 2020-07-05: qty 1

## 2020-07-05 MED ORDER — PEG-KCL-NACL-NASULF-NA ASC-C 100 G PO SOLR
0.5000 | Freq: Once | ORAL | Status: AC
  Administered 2020-07-06: 100 g via ORAL
  Filled 2020-07-05: qty 1

## 2020-07-05 NOTE — Progress Notes (Signed)
PROGRESS NOTE  Judy Carlson  DOB: 1951-02-04  PCP: Tresa Garter, MD GEX:[id number]  DOA: 07/04/2020  LOS: 1 day   Chief Complaint: SOB, weakness  Brief narrative: Judy Carlson is a 70 y.o. female with PMH significant for chronic smoking, Raynaud's disease, history of stroke, right carotid stenosis s/p CEA, HTN, HLD, impaired glucose tolerance, mood disorder.  Patient presented to the ED on 07/04/2020 with complaint of shortness of breath and weakness, lightheadedness intermittently for the last few weeks. She reports black stools for last couple of weeks. She takes daily ASA 325 mg and Plavix 75 mg for her h/o PAD.  In the ED, patient was noted to have a low hemoglobin of 6.7, FOBT positive Monitor PRBC given. Admitted to hospitalist service GI consultation called.  Subjective: Patient was seen and examined this morning.  Pleasant elderly African-American female.  Lying down in bed.  No acute distress.  Husband at bedside. Chart reviewed Afebrile, hemodynamically stable Labs this morning with hemoglobin up to 9.3, potassium low at 3.4  Assessment/Plan: Acute insidious GI bleeding -Presented with progressive lethargy, dyspnea on exertion, remote history of GI bleeding and FOBT positive stool with low hemoglobin of 6.7.   -GI consult appreciated. -Noted to plan to do EGD/colonoscopy tomorrow after Plavix washout. -Currently on PPI -Aspirin and Plavix on hold.  Acute blood loss anemia, Acute microcytic anemia -Hemoglobin was 11.4 a year ago.  Presented with a low hemoglobin of 6.7 -1 unit of PRBC transfusion the ED.  Hemoglobin improved to 9.2 this morning. Recent Labs    07/04/20 1236 07/05/20 0709  HGB 6.7* 9.3*  MCV 70.3* 75.6*   History of stroke PVD/HLD -s/p right CEA -Takes 325 mg ASA and Plavix daily; both are currently on hold -Reportedly has allergies to statin.  Continue Zetia  Essential hypertension -Continue Norvasc, Toprol XL  Stage 3a  CKD -Remains at baseline at this time.  Continue to monitor Recent Labs    07/04/20 1236 07/05/20 0709  BUN 14 11  CREATININE 1.23* 1.11*   Tobacco dependence -Reports minimal smoking at this time, praise and ongoing cessation encouraged -Does not need a patch currently  DNR -Admitting physician discussed with patient, patient chosen DNR status.    Mobility: Encourage ambulation Code Status:   Code Status: DNR  Nutritional status: Body mass index is 31.28 kg/m.     Diet Order            Diet full liquid Room service appropriate? Yes; Fluid consistency: Thin  Diet effective now                 DVT prophylaxis: SCDs Start: 07/04/20 1621   Antimicrobials:  None Fluid: Currently on full liquid diet.  Can stop IV fluid. Consultants: GI Family Communication:  Husband at bedside  Status is: Inpatient  Remains inpatient appropriate because: Planning for EGD/colonoscopy tomorrow  Dispo: The patient is from: Home              Anticipated d/c is to: Home in 2 to 3 days              Patient currently is not medically stable to d/c.   Difficult to place patient No     Infusions:  . sodium chloride      Scheduled Meds: . amLODipine  5 mg Oral Daily  . bisacodyl  20 mg Oral Once  . metoprolol succinate  100 mg Oral Daily  . pantoprazole  40 mg Oral BID  Antimicrobials: Anti-infectives (From admission, onward)   None      PRN meds: acetaminophen **OR** acetaminophen, hydrALAZINE, morphine injection, ondansetron **OR** ondansetron (ZOFRAN) IV   Objective: Vitals:   07/04/20 2305 07/05/20 0346  BP: (!) 148/72 135/74  Pulse: 76 77  Resp: 18 18  Temp: 98.7 F (37.1 C) 98.8 F (37.1 C)  SpO2: 100% 100%    Intake/Output Summary (Last 24 hours) at 07/05/2020 1120 Last data filed at 07/04/2020 2305 Gross per 24 hour  Intake 670 ml  Output --  Net 670 ml   Filed Weights   07/04/20 1231  Weight: 85.3 kg   Weight change:  Body mass index is 31.28  kg/m.   Physical Exam: General exam: Pleasant, elderly African-American female.  Not in physical distress Skin: No rashes, lesions or ulcers. HEENT: Atraumatic, normocephalic, no obvious bleeding Lungs: Clear to auscultation bilaterally CVS: Regular rate and rhythm, no murmur GI/Abd soft, nontender, nondistended, bowel sound present CNS: Alert, awake, oriented x3 Psychiatry: Mood appropriate Extremities: No pedal edema, no calf tenderness  Data Review: I have personally reviewed the laboratory data and studies available.  Recent Labs  Lab 07/04/20 1236 07/05/20 0709  WBC 8.4 7.2  HGB 6.7* 9.3*  HCT 23.7* 29.7*  MCV 70.3* 75.6*  PLT 508* 391   Recent Labs  Lab 07/04/20 1236 07/05/20 0709  NA 142 139  K 3.8 3.4*  CL 108 106  CO2 21* 24  GLUCOSE 130* 129*  BUN 14 11  CREATININE 1.23* 1.11*  CALCIUM 9.9 9.2    F/u labs ordered Unresulted Labs (From admission, onward)          Start     Ordered   07/06/20 0500  CBC with Differential/Platelet  Daily,   R      07/05/20 1120   07/06/20 0500  Basic metabolic panel  Daily,   R      07/05/20 1120   07/05/20 0500  CBC  5A & 5P,   R      07/04/20 1622   07/04/20 1236  Urinalysis, Routine w reflex microscopic  ONCE - STAT,   STAT        07/04/20 1236          Signed, Lorin Glass, MD [facility] 07/05/2020

## 2020-07-05 NOTE — Progress Notes (Addendum)
Attending physician's note   I have taken an interval history, reviewed the chart and examined the patient. I agree with the Advanced Practitioner's note, impression, and recommendations as outlined.   70 year old female with medical history as outlined below, presents with symptomatic anemia in the setting of DAPT (ASA and Plavix) for history of right CEA.  Additionally, she has history of iron deficiency.  Had been previously taking oral iron, which she ran out in 03/2020, and stopped taking.  Stools were still dark until about 05/2020, and eventually turned to normal again.  Last set of labs for review was 03/2019, with H/H 11.5/36.5 with MCV/RDW 89/16.8.  Ferritin 12, iron 49, TIBC 381, sat 13% at that time (and was continued on oral iron).  No labs for review in the interim until hospital admission yesterday with admission H/H 6.7/23.7 and MCV/RDW 70/19.7.  Transfused 2 unit PRBCs.  FOBT positive on admission.  No acute events overnight.  Tolerating p.o. without issue.  Hemodynamically stable.  Which.  H/H stable after 2 unit PRBC transfusion, currently 9.3/29.7.  No overt GI blood loss.  - Plan for EGD/colonoscopy on Thursday, 07/06/2020 to allow for additional Plavix washout. - Holding Plavix and ASA (prescribed for history of right CEA) - Continue serial H/H checks - Clears tomorrow with n.p.o. at midnight and bowel prep in anticipation of procedure on 5/4  Doristine Locks, DO, FACG [phone] office                                                                      Daily Rounding Note  07/05/2020, 12:07 PM  LOS: 1 day   SUBJECTIVE:   Chief complaint:    Symptomatic anemia.   Feels well  OBJECTIVE:         Vital signs in last 24 hours:    Temp:  [98 F (36.7 C)-98.8 F (37.1 C)] 98.8 F (37.1 C) (05/03 0346) Pulse Rate:  [68-85] 77 (05/03 0346) Resp:  [12-22] 18 (05/03 0346) BP: (112-161)/(61-90) 135/74 (05/03 0346) SpO2:  [96 %-100 %] 100 % (05/03 0346) Weight:   [85.3 kg] 85.3 kg (05/02 1231) Last BM Date: 07/04/20 Filed Weights   07/04/20 1231  Weight: 85.3 kg   General: pleasant, NAD   Heart: RRR Chest: clear Abdomen: soft,  NT, ND Extremities: no CCE Neuro/Psych:  oriented x 3.    Intake/Output from previous day: 05/02 0701 - 05/03 0700 In: 670 [Blood:670] Out: -   Intake/Output this shift: Total I/O In: 600 [P.O.:600] Out: -   Lab Results: Recent Labs    07/04/20 1236 07/05/20 0709  WBC 8.4 7.2  HGB 6.7* 9.3*  HCT 23.7* 29.7*  PLT 508* 391   BMET Recent Labs    07/04/20 1236 07/05/20 0709  NA 142 139  K 3.8 3.4*  CL 108 106  CO2 21* 24  GLUCOSE 130* 129*  BUN 14 11  CREATININE 1.23* 1.11*  CALCIUM 9.9 9.2   LFT Recent Labs    07/04/20 1236  PROT 7.1  ALBUMIN 3.8  AST 19  ALT 19  ALKPHOS 79  BILITOT 0.4   PT/INR No results for input(s): LABPROT, INR in the last 72 hours. Hepatitis Panel No results for input(s): HEPBSAG, HCVAB, HEPAIGM, HEPBIGM  in the last 72 hours.  Studies/Results: No results found.  ASSESMENT:   *   Symptomatic anemia Black, FOBT + stools.    *   Chronic plavix.  Last dose was Sunday.     PLAN   *   Egd, colonoscopy thursday    Jennye Moccasin  07/05/2020, 12:07 PM Phone [phone]

## 2020-07-06 DIAGNOSIS — D509 Iron deficiency anemia, unspecified: Secondary | ICD-10-CM

## 2020-07-06 DIAGNOSIS — E538 Deficiency of other specified B group vitamins: Secondary | ICD-10-CM

## 2020-07-06 LAB — URINALYSIS, ROUTINE W REFLEX MICROSCOPIC
Bilirubin Urine: NEGATIVE
Glucose, UA: NEGATIVE mg/dL
Ketones, ur: NEGATIVE mg/dL
Leukocytes,Ua: NEGATIVE
Nitrite: NEGATIVE
Protein, ur: NEGATIVE mg/dL
Specific Gravity, Urine: 1.004 — ABNORMAL LOW (ref 1.005–1.030)
pH: 7 (ref 5.0–8.0)

## 2020-07-06 LAB — CBC WITH DIFFERENTIAL/PLATELET
Abs Immature Granulocytes: 0.02 10*3/uL (ref 0.00–0.07)
Basophils Absolute: 0.1 10*3/uL (ref 0.0–0.1)
Basophils Relative: 1 %
Eosinophils Absolute: 0.1 10*3/uL (ref 0.0–0.5)
Eosinophils Relative: 1 %
HCT: 28.9 % — ABNORMAL LOW (ref 36.0–46.0)
Hemoglobin: 8.9 g/dL — ABNORMAL LOW (ref 12.0–15.0)
Immature Granulocytes: 0 %
Lymphocytes Relative: 25 %
Lymphs Abs: 1.8 10*3/uL (ref 0.7–4.0)
MCH: 23.1 pg — ABNORMAL LOW (ref 26.0–34.0)
MCHC: 30.8 g/dL (ref 30.0–36.0)
MCV: 75.1 fL — ABNORMAL LOW (ref 80.0–100.0)
Monocytes Absolute: 0.8 10*3/uL (ref 0.1–1.0)
Monocytes Relative: 11 %
Neutro Abs: 4.5 10*3/uL (ref 1.7–7.7)
Neutrophils Relative %: 62 %
Platelets: 360 10*3/uL (ref 150–400)
RBC: 3.85 MIL/uL — ABNORMAL LOW (ref 3.87–5.11)
RDW: 22.2 % — ABNORMAL HIGH (ref 11.5–15.5)
WBC: 7.3 10*3/uL (ref 4.0–10.5)
nRBC: 0 % (ref 0.0–0.2)

## 2020-07-06 LAB — CBC
HCT: 31.7 % — ABNORMAL LOW (ref 36.0–46.0)
Hemoglobin: 9.8 g/dL — ABNORMAL LOW (ref 12.0–15.0)
MCH: 23.4 pg — ABNORMAL LOW (ref 26.0–34.0)
MCHC: 30.9 g/dL (ref 30.0–36.0)
MCV: 75.8 fL — ABNORMAL LOW (ref 80.0–100.0)
Platelets: 380 10*3/uL (ref 150–400)
RBC: 4.18 MIL/uL (ref 3.87–5.11)
RDW: 22.9 % — ABNORMAL HIGH (ref 11.5–15.5)
WBC: 6.9 10*3/uL (ref 4.0–10.5)
nRBC: 0 % (ref 0.0–0.2)

## 2020-07-06 LAB — BASIC METABOLIC PANEL
Anion gap: 8 (ref 5–15)
BUN: 11 mg/dL (ref 8–23)
CO2: 24 mmol/L (ref 22–32)
Calcium: 9 mg/dL (ref 8.9–10.3)
Chloride: 108 mmol/L (ref 98–111)
Creatinine, Ser: 1.1 mg/dL — ABNORMAL HIGH (ref 0.44–1.00)
GFR, Estimated: 54 mL/min — ABNORMAL LOW (ref >60)
Glucose, Bld: 91 mg/dL (ref 70–99)
Potassium: 3.3 mmol/L — ABNORMAL LOW (ref 3.5–5.1)
Sodium: 140 mmol/L (ref 135–145)

## 2020-07-06 LAB — FOLATE: Folate: 6.2 ng/mL (ref >5.9)

## 2020-07-06 LAB — FERRITIN: Ferritin: 4 ng/mL — ABNORMAL LOW (ref 11–307)

## 2020-07-06 LAB — RETICULOCYTES
Immature Retic Fract: 23.5 % — ABNORMAL HIGH (ref 2.3–15.9)
RBC.: 3.84 MIL/uL — ABNORMAL LOW (ref 3.87–5.11)
Retic Count, Absolute: 76 10*3/uL (ref 19.0–186.0)
Retic Ct Pct: 2.1 % (ref 0.4–3.1)

## 2020-07-06 LAB — IRON AND TIBC
Iron: 32 ug/dL (ref 28–170)
Saturation Ratios: 7 % — ABNORMAL LOW (ref 10.4–31.8)
TIBC: 456 ug/dL — ABNORMAL HIGH (ref 250–450)
UIBC: 424 ug/dL

## 2020-07-06 LAB — VITAMIN B12: Vitamin B-12: 249 pg/mL (ref 180–914)

## 2020-07-06 MED ORDER — PHENYLEPHRINE IN HARD FAT 0.25 % RE SUPP
1.0000 | Freq: Two times a day (BID) | RECTAL | Status: DC
  Administered 2020-07-07: 1 via RECTAL
  Filled 2020-07-06 (×4): qty 1

## 2020-07-06 MED ORDER — POTASSIUM CHLORIDE CRYS ER 20 MEQ PO TBCR
40.0000 meq | EXTENDED_RELEASE_TABLET | Freq: Once | ORAL | Status: AC
  Administered 2020-07-06: 40 meq via ORAL
  Filled 2020-07-06: qty 2

## 2020-07-06 NOTE — Progress Notes (Signed)
Patient voiced that she gets hemorrhoids when she is under stress and request for prep H. Attending MD notified.   Moviprep started. Patient aware 2nd part of prep is at 11PM and to be NPO after midnight for procedure. Consent signed and in chart.

## 2020-07-06 NOTE — Progress Notes (Signed)
PROGRESS NOTE  Judy Carlson  DOB: 02/04/1951  PCP: Plotnikov, Aleksei V, MD MRN:[id number]  DOA: 07/04/2020  LOS: 2 days   Chief Complaint: SOB, weakness  Brief narrative: Judy Carlson is a 70 y.o. female with PMH significant for chronic smoking, Raynaud's disease, history of stroke, right carotid stenosis s/p CEA, HTN, HLD, impaired glucose tolerance, mood disorder.  Patient presented to the ED on 07/04/2020 with complaint of shortness of breath and weakness, lightheadedness intermittently for the last few weeks. She reports black stools for last couple of weeks. She takes daily ASA 325 mg and Plavix 75 mg for her h/o PAD.  In the ED, patient was noted to have a low hemoglobin of 6.7, FOBT positive Monitor PRBC given. Admitted to hospitalist service GI consultation called.  Subjective: Patient was seen and examined this morning.  Pleasant elderly African-American female.  Lying down in bed.  No acute distress.  Family not at bedside today. Patient has no new complaint. Lab from this morning with potassium low at 3.3 and hemoglobin low at 8.9  Assessment/Plan: Acute insidious GI bleeding -Presented with progressive lethargy, dyspnea on exertion, remote history of GI bleeding and FOBT positive stool with low hemoglobin of 6.7.   -GI consult appreciated. -Noted to plan to do EGD/colonoscopy tomorrow 5/5 after Plavix washout. -Currently on PPI -Aspirin and Plavix on hold.  Acute blood loss anemia, Acute microcytic anemia -Hemoglobin was 11.4 a year ago.  Presented with a low hemoglobin of 6.7 -1 unit of PRBC transfusion the ED.  Hemoglobin down from 9.3 yesterday to 8.9 today.   -Continue to monitor.  Obtain anemia panel Recent Labs    07/04/20 1236 07/05/20 0709 07/05/20 1658 07/06/20 0507  HGB 6.7* 9.3* 9.3* 8.9*  MCV 70.3* 75.6* 75.8* 75.1*   History of stroke PVD/HLD -s/p right CEA -Takes 325 mg ASA and Plavix daily; both are currently on hold -Reportedly has  allergies to statin.  Continue Zetia  Essential hypertension -Blood pressure controlled.  Continue Norvasc, Toprol XL  Stage 3a CKD -Remains at baseline at this time.  Continue to monitor Recent Labs    07/04/20 1236 07/05/20 0709 07/06/20 0507  BUN 14 11 11  CREATININE 1.23* 1.11* 1.10*   Hypokalemia -Potassium low at 3.3 today.  Oral potassium replacement ordered.  Repeat labs tomorrow. Recent Labs  Lab 07/04/20 1236 07/05/20 0709 07/06/20 0507  K 3.8 3.4* 3.3*   Tobacco dependence -Reports minimal smoking at this time, praise and ongoing cessation encouraged -Does not need a patch currently  Mobility: Encourage ambulation Code Status:   Code Status: DNR  Nutritional status: Body mass index is 31.28 kg/m.     Diet Order            Diet NPO time specified  Diet effective 0500 tomorrow           Diet clear liquid Room service appropriate? Yes; Fluid consistency: Thin  Diet effective now                 DVT prophylaxis: SCDs Start: 07/04/20 1621   Antimicrobials:  None Fluid: Currently on clear liquid diet Consultants: GI Family Communication:  Husband not at bedside today  Status is: Inpatient  Remains inpatient appropriate because: Planning for EGD/colonoscopy tomorrow 5/5  Dispo: The patient is from: Home              Anticipated d/c is to: Home in 1 to 2 days                Patient currently is not medically stable to d/c.   Difficult to place patient No     Infusions:  . sodium chloride      Scheduled Meds: . amLODipine  5 mg Oral Daily  . bisacodyl  20 mg Oral Once  . metoprolol succinate  100 mg Oral Daily  . pantoprazole  40 mg Oral BID  . peg 3350 powder  0.5 kit Oral Once   And  . peg 3350 powder  0.5 kit Oral Once    Antimicrobials: Anti-infectives (From admission, onward)   None      PRN meds: acetaminophen **OR** acetaminophen, hydrALAZINE, morphine injection, ondansetron **OR** ondansetron (ZOFRAN) IV    Objective: Vitals:   07/06/20 0105 07/06/20 0517  BP: 139/74 130/74  Pulse: 66 62  Resp: 18 18  Temp: 98.4 F (36.9 C) 98.4 F (36.9 C)  SpO2: 96% 97%    Intake/Output Summary (Last 24 hours) at 07/06/2020 1013 Last data filed at 07/06/2020 0936 Gross per 24 hour  Intake 720 ml  Output --  Net 720 ml   Filed Weights   07/04/20 1231  Weight: 85.3 kg   Weight change:  Body mass index is 31.28 kg/m.   Physical Exam: General exam: Pleasant, elderly African-American female.  Not in physical distress Skin: No rashes, lesions or ulcers. HEENT: Atraumatic, normocephalic, no obvious bleeding Lungs: Clear to auscultation bilaterally CVS: Regular rate and rhythm, no murmur GI/Abd soft, nontender, nondistended, bowel sound present CNS: Alert, awake, oriented x3 Psychiatry: Mood appropriate Extremities: No pedal edema, no calf tenderness  Data Review: I have personally reviewed the laboratory data and studies available.  Recent Labs  Lab 07/04/20 1236 07/05/20 0709 07/05/20 1658 07/06/20 0507  WBC 8.4 7.2 6.5 7.3  NEUTROABS  --   --   --  4.5  HGB 6.7* 9.3* 9.3* 8.9*  HCT 23.7* 29.7* 30.4* 28.9*  MCV 70.3* 75.6* 75.8* 75.1*  PLT 508* 391 359 360   Recent Labs  Lab 07/04/20 1236 07/05/20 0709 07/06/20 0507  NA 142 139 140  K 3.8 3.4* 3.3*  CL 108 106 108  CO2 21* 24 24  GLUCOSE 130* 129* 91  BUN 14 11 11  CREATININE 1.23* 1.11* 1.10*  CALCIUM 9.9 9.2 9.0    F/u labs ordered Unresulted Labs (From admission, onward)          Start     Ordered   07/06/20 0500  CBC with Differential/Platelet  Daily,   R      07/05/20 1120   07/06/20 0500  Basic metabolic panel  Daily,   R      07/05/20 1120   07/05/20 0500  CBC  5A & 5P,   R      07/04/20 1622   07/04/20 1236  Urinalysis, Routine w reflex microscopic  ONCE - STAT,   STAT        07/04/20 1236   Unscheduled  Vitamin B12  (Anemia Panel (PNL))  Add-on,   R        07/06/20 1013   Unscheduled  Folate   (Anemia Panel (PNL))  Add-on,   R        07/06/20 1013   Unscheduled  Iron and TIBC  (Anemia Panel (PNL))  Add-on,   R        07/06/20 1013   Unscheduled  Ferritin  (Anemia Panel (PNL))  Add-on,   R        07/06/20 1013   Unscheduled    Reticulocytes  (Anemia Panel (PNL))  Add-on,   R        07/06/20 1013          Signed,  , MD [facility] 07/06/2020            

## 2020-07-06 NOTE — H&P (View-Only) (Signed)
Patient doing well.  No complaints.  Feeling stronger.   Hgb 8.9. Potassium 3.3, oral supplement ordered. She has slot for colonoscopy, EGD tomorrow at 0730 for investigation of FOBT positive IDA anemia See bowel prep orders.  BMET in AM  Sarah Gribbin PA-C  [facility] ROUNDING NOTE   Subjective: No acute events overnight.  No complaints today.  Transition to clear liquid diet today with plan for bowel prep this evening and EGD/colonoscopy tomorrow.  H/H stable at 8.9/28.3 after 2 unit PRBC transfusion earlier on admission (for hemoglobin of 6.7 at that time).  Ferritin 4, iron 32, TIBC 456, sat 7% Normal B12 Folate 6.2   Objective: Vital signs in last 24 hours: Temp:  [98 F (36.7 C)-98.4 F (36.9 C)] 98 F (36.7 C) (05/04 1133) Pulse Rate:  [62-70] 70 (05/04 1133) Resp:  [18] 18 (05/04 1133) BP: (126-139)/(73-92) 126/92 (05/04 1133) SpO2:  [95 %-100 %] 95 % (05/04 1133) Last BM Date: 07/04/20 General: NAD, sitting upright in chair.  Well conversive    Intake/Output from previous day: 05/03 0701 - 05/04 0700 In: 600 [P.O.:600] Out: -  Intake/Output this shift: Total I/O In: 360 [P.O.:360] Out: -    Lab Results: Recent Labs    07/05/20 0709 07/05/20 1658 07/06/20 0507  WBC 7.2 6.5 7.3  HGB 9.3* 9.3* 8.9*  PLT 391 359 360  MCV 75.6* 75.8* 75.1*   BMET Recent Labs    07/04/20 1236 07/05/20 0709 07/06/20 0507  NA 142 139 140  K 3.8 3.4* 3.3*  CL 108 106 108  CO2 21* 24 24  GLUCOSE 130* 129* 91  BUN 14 11 11   CREATININE 1.23* 1.11* 1.10*  CALCIUM 9.9 9.2 9.0   LFT Recent Labs    07/04/20 1236  PROT 7.1  ALBUMIN 3.8  AST 19  ALT 19  ALKPHOS 79  BILITOT 0.4   PT/INR No results for input(s): INR in the last 72 hours.    Imaging/Other results: No results found.    Assessment and Plan:  1) Iron deficiency anemia 2) FOBT positive stool 3) Dual antiplatelet therapy 4) History of CVA 5) History of CVA 6) Folate  insufficiency  - Clears today - Bowel prep this evening with n.p.o. at midnight - Plan for EGD/colonoscopy tomorrow at 7:30 AM - Holding ASA/Plavix - Will likely benefit from dose of IV iron prior to hospital discharge - Reasonable to start folic acid 1 mg/day (folate 6.2)  The indications, risks, and benefits of EGD and colonoscopy were explained to the patient in detail. Risks include but are not limited to bleeding, perforation, adverse reaction to medications, and cardiopulmonary compromise. Sequelae include but are not limited to the possibility of surgery, hospitalization, and mortality. The patient verbalized understanding and wished to proceed.    09/03/20, DO  07/06/2020, 12:53 PM [facility] Pager [phone]

## 2020-07-06 NOTE — Progress Notes (Addendum)
Patient doing well.  No complaints.  Feeling stronger.   Hgb 8.9. Potassium 3.3, oral supplement ordered. She has slot for colonoscopy, EGD tomorrow at 0730 for investigation of FOBT positive IDA anemia See bowel prep orders.  BMET in AM  Sarah Gribbin PA-C  [facility] ROUNDING NOTE   Subjective: No acute events overnight.  No complaints today.  Transition to clear liquid diet today with plan for bowel prep this evening and EGD/colonoscopy tomorrow.  H/H stable at 8.9/28.3 after 2 unit PRBC transfusion earlier on admission (for hemoglobin of 6.7 at that time).  Ferritin 4, iron 32, TIBC 456, sat 7% Normal B12 Folate 6.2   Objective: Vital signs in last 24 hours: Temp:  [98 F (36.7 C)-98.4 F (36.9 C)] 98 F (36.7 C) (05/04 1133) Pulse Rate:  [62-70] 70 (05/04 1133) Resp:  [18] 18 (05/04 1133) BP: (126-139)/(73-92) 126/92 (05/04 1133) SpO2:  [95 %-100 %] 95 % (05/04 1133) Last BM Date: 07/04/20 General: NAD, sitting upright in chair.  Well conversive    Intake/Output from previous day: 05/03 0701 - 05/04 0700 In: 600 [P.O.:600] Out: -  Intake/Output this shift: Total I/O In: 360 [P.O.:360] Out: -    Lab Results: Recent Labs    07/05/20 0709 07/05/20 1658 07/06/20 0507  WBC 7.2 6.5 7.3  HGB 9.3* 9.3* 8.9*  PLT 391 359 360  MCV 75.6* 75.8* 75.1*   BMET Recent Labs    07/04/20 1236 07/05/20 0709 07/06/20 0507  NA 142 139 140  K 3.8 3.4* 3.3*  CL 108 106 108  CO2 21* 24 24  GLUCOSE 130* 129* 91  BUN 14 11 11  CREATININE 1.23* 1.11* 1.10*  CALCIUM 9.9 9.2 9.0   LFT Recent Labs    07/04/20 1236  PROT 7.1  ALBUMIN 3.8  AST 19  ALT 19  ALKPHOS 79  BILITOT 0.4   PT/INR No results for input(s): INR in the last 72 hours.    Imaging/Other results: No results found.    Assessment and Plan:  1) Iron deficiency anemia 2) FOBT positive stool 3) Dual antiplatelet therapy 4) History of CVA 5) History of CVA 6) Folate  insufficiency  - Clears today - Bowel prep this evening with n.p.o. at midnight - Plan for EGD/colonoscopy tomorrow at 7:30 AM - Holding ASA/Plavix - Will likely benefit from dose of IV iron prior to hospital discharge - Reasonable to start folic acid 1 mg/day (folate 6.2)  The indications, risks, and benefits of EGD and colonoscopy were explained to the patient in detail. Risks include but are not limited to bleeding, perforation, adverse reaction to medications, and cardiopulmonary compromise. Sequelae include but are not limited to the possibility of surgery, hospitalization, and mortality. The patient verbalized understanding and wished to proceed.    Kinberly Perris V Cornie Mccomber, DO  07/06/2020, 12:53 PM [facility] Pager [phone]   

## 2020-07-07 ENCOUNTER — Telehealth: Payer: Self-pay | Admitting: General Surgery

## 2020-07-07 ENCOUNTER — Encounter (HOSPITAL_COMMUNITY)
Admission: EM | Disposition: A | Discharge status: Home / Self Care | Payer: Self-pay | Source: Home / Self Care | Attending: Internal Medicine

## 2020-07-07 ENCOUNTER — Inpatient Hospital Stay (HOSPITAL_COMMUNITY): Payer: Medicare PPO | Admitting: Certified Registered Nurse Anesthetist

## 2020-07-07 ENCOUNTER — Encounter (HOSPITAL_COMMUNITY): Payer: Self-pay | Admitting: Internal Medicine

## 2020-07-07 DIAGNOSIS — K922 Gastrointestinal hemorrhage, unspecified: Secondary | ICD-10-CM | POA: Diagnosis not present

## 2020-07-07 DIAGNOSIS — K269 Duodenal ulcer, unspecified as acute or chronic, without hemorrhage or perforation: Secondary | ICD-10-CM

## 2020-07-07 DIAGNOSIS — K2980 Duodenitis without bleeding: Secondary | ICD-10-CM

## 2020-07-07 DIAGNOSIS — N1831 Chronic kidney disease, stage 3a: Secondary | ICD-10-CM

## 2020-07-07 DIAGNOSIS — B9681 Helicobacter pylori [H. pylori] as the cause of diseases classified elsewhere: Secondary | ICD-10-CM

## 2020-07-07 DIAGNOSIS — F17210 Nicotine dependence, cigarettes, uncomplicated: Secondary | ICD-10-CM

## 2020-07-07 DIAGNOSIS — K5730 Diverticulosis of large intestine without perforation or abscess without bleeding: Secondary | ICD-10-CM

## 2020-07-07 DIAGNOSIS — K259 Gastric ulcer, unspecified as acute or chronic, without hemorrhage or perforation: Secondary | ICD-10-CM

## 2020-07-07 DIAGNOSIS — R7309 Other abnormal glucose: Secondary | ICD-10-CM

## 2020-07-07 DIAGNOSIS — K635 Polyp of colon: Secondary | ICD-10-CM

## 2020-07-07 DIAGNOSIS — I129 Hypertensive chronic kidney disease with stage 1 through stage 4 chronic kidney disease, or unspecified chronic kidney disease: Secondary | ICD-10-CM | POA: Diagnosis not present

## 2020-07-07 DIAGNOSIS — K2970 Gastritis, unspecified, without bleeding: Secondary | ICD-10-CM

## 2020-07-07 DIAGNOSIS — D649 Anemia, unspecified: Secondary | ICD-10-CM | POA: Diagnosis not present

## 2020-07-07 DIAGNOSIS — K648 Other hemorrhoids: Secondary | ICD-10-CM

## 2020-07-07 DIAGNOSIS — E785 Hyperlipidemia, unspecified: Secondary | ICD-10-CM

## 2020-07-07 DIAGNOSIS — K621 Rectal polyp: Secondary | ICD-10-CM

## 2020-07-07 DIAGNOSIS — I739 Peripheral vascular disease, unspecified: Secondary | ICD-10-CM

## 2020-07-07 HISTORY — PX: BIOPSY: SHX5522

## 2020-07-07 HISTORY — PX: ESOPHAGOGASTRODUODENOSCOPY (EGD) WITH PROPOFOL: SHX5813

## 2020-07-07 HISTORY — PX: COLONOSCOPY WITH PROPOFOL: SHX5780

## 2020-07-07 HISTORY — PX: POLYPECTOMY: SHX5525

## 2020-07-07 LAB — CBC WITH DIFFERENTIAL/PLATELET
Abs Immature Granulocytes: 0 10*3/uL (ref 0.00–0.07)
Basophils Absolute: 0 10*3/uL (ref 0.0–0.1)
Basophils Relative: 0 %
Eosinophils Absolute: 0 10*3/uL (ref 0.0–0.5)
Eosinophils Relative: 0 %
HCT: 31.6 % — ABNORMAL LOW (ref 36.0–46.0)
Hemoglobin: 9.8 g/dL — ABNORMAL LOW (ref 12.0–15.0)
Lymphocytes Relative: 12 %
Lymphs Abs: 1 10*3/uL (ref 0.7–4.0)
MCH: 23.6 pg — ABNORMAL LOW (ref 26.0–34.0)
MCHC: 31 g/dL (ref 30.0–36.0)
MCV: 76.1 fL — ABNORMAL LOW (ref 80.0–100.0)
Monocytes Absolute: 0.5 10*3/uL (ref 0.1–1.0)
Monocytes Relative: 6 %
Neutro Abs: 7.1 10*3/uL (ref 1.7–7.7)
Neutrophils Relative %: 82 %
Platelets: 378 10*3/uL (ref 150–400)
RBC: 4.15 MIL/uL (ref 3.87–5.11)
RDW: 22.9 % — ABNORMAL HIGH (ref 11.5–15.5)
WBC: 8.7 10*3/uL (ref 4.0–10.5)
nRBC: 0 % (ref 0.0–0.2)
nRBC: 0 /100 WBC

## 2020-07-07 LAB — BASIC METABOLIC PANEL
Anion gap: 8 (ref 5–15)
BUN: 11 mg/dL (ref 8–23)
CO2: 19 mmol/L — ABNORMAL LOW (ref 22–32)
Calcium: 9.5 mg/dL (ref 8.9–10.3)
Chloride: 115 mmol/L — ABNORMAL HIGH (ref 98–111)
Creatinine, Ser: 1.1 mg/dL — ABNORMAL HIGH (ref 0.44–1.00)
GFR, Estimated: 54 mL/min — ABNORMAL LOW (ref >60)
Glucose, Bld: 105 mg/dL — ABNORMAL HIGH (ref 70–99)
Potassium: 3.6 mmol/L (ref 3.5–5.1)
Sodium: 142 mmol/L (ref 135–145)

## 2020-07-07 SURGERY — COLONOSCOPY WITH PROPOFOL
Anesthesia: Monitor Anesthesia Care

## 2020-07-07 SURGERY — ESOPHAGOGASTRODUODENOSCOPY (EGD) WITH PROPOFOL
Anesthesia: Monitor Anesthesia Care

## 2020-07-07 MED ORDER — SODIUM CHLORIDE 0.9 % IV SOLN: 300.0000 mg | Freq: Once | INTRAVENOUS | Status: DC

## 2020-07-07 MED ORDER — ONDANSETRON HCL 4 MG/2ML IJ SOLN
INTRAMUSCULAR | Status: DC | PRN
  Administered 2020-07-07: 4 mg via INTRAVENOUS

## 2020-07-07 MED ORDER — LIDOCAINE 2% (20 MG/ML) 5 ML SYRINGE
INTRAMUSCULAR | Status: DC | PRN
  Administered 2020-07-07: 40 mg via INTRAVENOUS

## 2020-07-07 MED ORDER — PROPOFOL 500 MG/50ML IV EMUL
INTRAVENOUS | Status: DC | PRN
  Administered 2020-07-07: 50 ug/kg/min via INTRAVENOUS

## 2020-07-07 MED ORDER — PROPOFOL 10 MG/ML IV BOLUS
INTRAVENOUS | Status: DC | PRN
  Administered 2020-07-07: 70 mg via INTRAVENOUS

## 2020-07-07 MED ORDER — SODIUM CHLORIDE 0.9 % IV SOLN
300.0000 mg | Freq: Once | INTRAVENOUS | Status: AC
  Administered 2020-07-07: 300 mg via INTRAVENOUS
  Filled 2020-07-07: qty 24

## 2020-07-07 MED ORDER — GLYCOPYRROLATE 0.2 MG/ML IJ SOLN
INTRAMUSCULAR | Status: DC | PRN
  Administered 2020-07-07: .2 mg via INTRAVENOUS

## 2020-07-07 MED ORDER — LACTATED RINGERS IV SOLN: INTRAVENOUS | Status: DC | PRN

## 2020-07-07 SURGICAL SUPPLY — 25 items

## 2020-07-07 NOTE — Discharge Instructions (Signed)

## 2020-07-07 NOTE — Anesthesia Postprocedure Evaluation (Signed)
Anesthesia Post Note  Patient: Judy Carlson  Procedure(s) Performed: COLONOSCOPY WITH PROPOFOL (N/A ) ESOPHAGOGASTRODUODENOSCOPY (EGD) WITH PROPOFOL (N/A ) BIOPSY POLYPECTOMY     Patient location during evaluation: Endoscopy Anesthesia Type: MAC Level of consciousness: awake Pain management: pain level controlled Vital Signs Assessment: post-procedure vital signs reviewed and stable Cardiovascular status: stable Postop Assessment: no apparent nausea or vomiting Anesthetic complications: no   No complications documented.  Last Vitals:  Vitals:   07/07/20 0932 07/07/20 1153  BP: (!) 156/92 133/74  Pulse: 89 78  Resp: 18 18  Temp: 36.4 C 36.8 C  SpO2: 97% 97%    Last Pain:  Vitals:   07/07/20 1200  TempSrc:   PainSc: 0-No pain                 Jamil Armwood

## 2020-07-07 NOTE — Transfer of Care (Signed)
Immediate Anesthesia Transfer of Care Note  Patient: Judy Carlson  Procedure(s) Performed: COLONOSCOPY WITH PROPOFOL (N/A ) ESOPHAGOGASTRODUODENOSCOPY (EGD) WITH PROPOFOL (N/A ) BIOPSY POLYPECTOMY  Patient Location: Endoscopy Unit  Anesthesia Type:MAC  Level of Consciousness: drowsy  Airway & Oxygen Therapy: Patient Spontanous Breathing and Patient connected to nasal cannula oxygen  Post-op Assessment: Report given to RN and Post -op Vital signs reviewed and stable  Post vital signs: Reviewed and stable  Last Vitals:  Vitals Value Taken Time  BP    Temp    Pulse 92 07/07/20 0845  Resp 28 07/07/20 0845  SpO2 98 % 07/07/20 0845  Vitals shown include unvalidated device data.  Last Pain:  Vitals:   07/07/20 0655  TempSrc: Temporal  PainSc: 0-No pain         Complications: No complications documented.

## 2020-07-07 NOTE — Progress Notes (Signed)
Ok to substitute Nulecit for Venofer due to formulary restriction per Dr. Shawnie Pons.  Ulyses Southward, PharmD, BCIDP, AAHIVP, CPP Infectious Disease Pharmacist 07/07/2020 9:56 AM

## 2020-07-07 NOTE — Anesthesia Preprocedure Evaluation (Addendum)
Anesthesia Evaluation  Patient identified by MRN, date of birth, ID band Patient awake    Reviewed: Allergy & Precautions, NPO status , Patient's Chart, lab work & pertinent test results  History of Anesthesia Complications (+) history of anesthetic complications  Airway Mallampati: II       Dental   Pulmonary COPD, Current Smoker and Patient abstained from smoking.,    breath sounds clear to auscultation       Cardiovascular hypertension,  Rhythm:Regular Rate:Normal     Neuro/Psych PSYCHIATRIC DISORDERS TIA Neuromuscular disease    GI/Hepatic Neg liver ROS, GERD  ,  Endo/Other    Renal/GU Renal disease     Musculoskeletal  (+) Arthritis ,   Abdominal   Peds  Hematology  (+) anemia ,   Anesthesia Other Findings   Reproductive/Obstetrics                             Anesthesia Physical Anesthesia Plan  ASA: III  Anesthesia Plan: MAC   Post-op Pain Management:    Induction: Intravenous  PONV Risk Score and Plan: Propofol infusion  Airway Management Planned: Nasal Cannula and Simple Face Mask  Additional Equipment:   Intra-op Plan:   Post-operative Plan:   Informed Consent: I have reviewed the patients History and Physical, chart, labs and discussed the procedure including the risks, benefits and alternatives for the proposed anesthesia with the patient or authorized representative who has indicated his/her understanding and acceptance.     Dental advisory given  Plan Discussed with: CRNA and Anesthesiologist  Anesthesia Plan Comments:         Anesthesia Quick Evaluation

## 2020-07-07 NOTE — Op Note (Signed)
[facility] Patient Name: Judy Carlson Procedure Date : 07/07/2020 MRN: [id number] Attending MD: Doristine LocksVito Marlaina Coburn , MD Date of Birth: Jul 14, 1950 CSN: [id number] Age: 70 Admit Type: Inpatient Procedure:                Colonoscopy Indications:              Upper abdominal pain, Iron deficiency anemia Providers:                Doristine LocksVito Alexxa Sabet, MD, Rogue JurySandy Andrews, RN, Sunday CornFaustina                            Mbumina, Technician Referring MD:              Medicines:                Monitored Anesthesia Care Complications:            No immediate complications. Estimated Blood Loss:     Estimated blood loss was minimal. Procedure:                Pre-Anesthesia Assessment:                           - Prior to the procedure, a History and Physical                            was performed, and patient medications and                            allergies were reviewed. The patient's tolerance of                            previous anesthesia was also reviewed. The risks                            and benefits of the procedure and the sedation                            options and risks were discussed with the patient.                            All questions were answered, and informed consent                            was obtained. Prior Anticoagulants: The patient has                            taken Plavix (clopidogrel), last dose was 4 days                            prior to procedure. ASA Grade Assessment: III - A                            patient with severe systemic disease. After  reviewing the risks and benefits, the patient was                            deemed in satisfactory condition to undergo the                            procedure.                           After obtaining informed consent, the colonoscope                            was passed under direct vision. Throughout the                            procedure, the patient's blood  pressure, pulse, and                            oxygen saturations were monitored continuously. The                            CF-HQ190L ([id number] Olympus colonoscope was                            introduced through the anus and advanced to the the                            terminal ileum. The colonoscopy was technically                            difficult and complex due to significant looping                            and a tortuous colon. Successful completion of the                            procedure was aided by changing the patient to a                            supine position and using manual pressure. The                            patient tolerated the procedure well. The quality                            of the bowel preparation was good. The terminal                            ileum, ileocecal valve, appendiceal orifice, and                            rectum were photographed. Scope In: 7:53:11 AM Scope Out: 8:30:12 AM Scope Withdrawal Time: 0 hours 14 minutes 37 seconds  Total Procedure Duration: 0 hours 37  minutes 1 second  Findings:      The perianal and digital rectal examinations were normal.      The sigmoid colon was significantly tortuous.      Many small and large-mouthed diverticula were found in the entire colon.      Three sessile polyps were found in the sigmoid colon. The polyps were 3       to 5 mm in size. These polyps were removed with a cold snare. Resection       and retrieval were complete. Estimated blood loss was minimal.      Multiple sessile polyps were found in the rectum and recto-sigmoid       colon. The polyps were 1 to 3 mm in size. Several of these polyps were       removed with a cold snare for histologic representative evaluation.       Resection and retrieval were complete. Estimated blood loss was minimal.      Non-bleeding internal hemorrhoids were found during retroflexion. The       hemorrhoids were small.      The terminal ileum  appeared normal. Impression:               - Tortuous colon.                           - Diverticulosis in the entire examined colon.                           - Three 3 to 5 mm polyps in the sigmoid colon,                            removed with a cold snare. Resected and retrieved.                           - Multiple 1 to 3 mm polyps in the rectum and at                            the recto-sigmoid colon, removed with a cold snare.                            Resected and retrieved.                           - Non-bleeding internal hemorrhoids.                           - The examined portion of the ileum was normal. Recommendation:           - Return patient to hospital ward for possible                            discharge same day.                           - Advance diet as tolerated.                           - Await pathology results.                           -  Repeat colonoscopy for surveillance based on                            pathology results.                           - Please give a dose of IV iron prior to discharge.                           - Return to GI office at appointment to be                            scheduled. Procedure Code(s):        --- Professional ---                           [phone], Colonoscopy, flexible; with removal of                            tumor(s), polyp(s), or other lesion(s) by snare                            technique Diagnosis Code(s):        --- Professional ---                           K64.8, Other hemorrhoids                           K62.1, Rectal polyp                           K63.5, Polyp of colon                           R10.10, Upper abdominal pain, unspecified                           D50.9, Iron deficiency anemia, unspecified                           K57.30, Diverticulosis of large intestine without                            perforation or abscess without bleeding                           Q43.8, Other specified  congenital malformations of                            intestine CPT copyright 2019 [employer]. All rights reserved. The codes documented in this report are preliminary and upon coder review may  be revised to meet current compliance requirements. Doristine Locks, MD 07/07/2020 8:50:35 AM Number of Addenda: 0

## 2020-07-07 NOTE — Progress Notes (Signed)
Pt transported to Endo 

## 2020-07-07 NOTE — Plan of Care (Signed)

## 2020-07-07 NOTE — Progress Notes (Signed)
Patient arrived back to room from ENDO. VSS. Patient c/o minimal achy pain on her lower abdomen. Tylenol given per MAR. POC updated. Will continue to monitor.

## 2020-07-07 NOTE — Telephone Encounter (Signed)
Orders placed for CBC and follow up with Dr Myrtie Neither scheduled

## 2020-07-07 NOTE — Interval H&P Note (Signed)
History and Physical Interval Note:  07/07/2020 7:23 AM  Judy Carlson  has presented today for surgery, with the diagnosis of Symptomatic microcytic anemia.  FOBT positive stool..  The various methods of treatment have been discussed with the patient and family. After consideration of risks, benefits and other options for treatment, the patient has consented to  Procedure(s): COLONOSCOPY WITH PROPOFOL (N/A) ESOPHAGOGASTRODUODENOSCOPY (EGD) WITH PROPOFOL (N/A) as a surgical intervention.  The patient's history has been reviewed, patient examined, no change in status, stable for surgery.  I have reviewed the patient's chart and labs.  Questions were answered to the patient's satisfaction.     Verlin Dike [name]

## 2020-07-07 NOTE — Discharge Summary (Signed)
Physician Discharge Summary  PASHA GADISON ZOX:[id number] DOB: [birth year] DOA: 07/04/2020  PCP: Tresa Garter, MD  Admit date: 07/04/2020 Discharge date: 07/07/2020  Admitted From: home Disposition:  home  Recommendations for Outpatient Follow-up:  1. Follow up with PCP in 1-2 weeks 2. Please obtain CBC in 7 to 10 days 3. Please follow up on the following pending results:  Home Health: No Equipment/Devices: None  Discharge Condition: Stable CODE STATUS: DNR Diet recommendation: Advance as tolerated to low-sodium heart healthy  Brief/Interim Summary: Judy Carlson is a 70 y.o. female with medical history significant of tobacco dependence; Raynauds; PVD s/p CEA; HTN; HLD; impaired glucose tolerance; and mood disorder presenting with SOB, weakness.  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  She would notice these things with activities around the house.  She has had these symptoms periodically for a couple of months, but more frequently in the last 2 weeks.  She has had to have frequent rest breaks.  She walked in for the eye exam today and she got light-headed and the nurse called her husband.  Last Wednesday, she tried to make a PCP appointment and wasn't able to be seen until tomorrow.  She developed transient CP in the doctor's office and tinkling in her hands, concerned about stroke.  She had rectal bleeding in 2017, told she had polyps and had been diagnosed with diverticulosis in 2012.  She has had periodic abdominal pain with diarrhea and vomiting simultaneously, has not paid much attention to that since it is so sporadic (maybe once a month).  She has had intermittent bandlike cramping across the entire top of her stomach for the last week or so.  She noticed a change in color in her stool this past Friday or Saturday - it was green or yellowish.  Her stool has not been  black since March - but she stopped taking iron in January.  She takes only Tylenol for pain.  She takes daily ASA 325 mg for her h/o PAD.  Discharge Diagnoses:  Principal Problem:   Acute upper GI bleeding Active Problems:   Essential hypertension, benign   Dyslipidemia   Glucose intolerance (impaired glucose tolerance)   CKD (chronic kidney disease) stage 3, GFR 30-59 ml/min (HCC)   Carotid stenosis   DNR (do not resuscitate)   Symptomatic anemia   Folate deficiency   Diverticulosis of colon without hemorrhage   Internal hemorrhoids   Multiple polyps of sigmoid colon   Gastritis and gastroduodenitis   Multiple gastric ulcers   Multiple duodenal ulcers   Acute GI bleeding -Patient underwent EGD which revealed multiple small ulcers which were all biopsied, colonoscopy which revealed multiple diverticula but no active bleeding.  ABLA -Patient's lightheadedness and fatigue are most likely caused by anemia secondary to upper GI bleeding.  -Her Hgb was 6.7 status post 2 units up to 9.8 at time of discharge - patient received IV iron prior to discharge as well per GI recommendations.  PVD -s/p CEA -Takes 325 mg ASA and Plavix daily; both are currently on hold -Per GI may resume aspirin tomorrow, and Plavix in 2 days.  HTN -Continue Norvasc, Toprol XL  HLD -Hold Zetia due to limited inpatient utility -Has reported allergy to statins  IGT -Minimally elevated glucose currently -Not on home medications -Will monitor with fasting glucose for now  Stage 3a CKD -Slightly  worse than prior baseline, back down to 1.1 at time of discharge. -Status post IV fluid hydration  Tobacco dependence -Reports minimal smoking at this time, praise and ongoing cessation encouraged   Discharge Instructions:  Discharge Instructions    Call MD for:   Complete by: As directed    Ongoing bleeding   Call MD for:  difficulty breathing, headache or visual disturbances   Complete by: As  directed    Call MD for:  extreme fatigue   Complete by: As directed    Call MD for:  persistant nausea and vomiting   Complete by: As directed    Call MD for:  severe uncontrolled pain   Complete by: As directed    Diet - low sodium heart healthy   Complete by: As directed    Discharge instructions   Complete by: As directed    Resume Plavix in 2 days   Increase activity slowly   Complete by: As directed      Allergies as of 07/07/2020      Reactions   Crestor [rosuvastatin Calcium] Other (See Comments)   myalgias   Doxycycline Other (See Comments)   Unknown reaction   Lipitor [atorvastatin] Other (See Comments)   Myalgias/feet pain   Lisinopril Swelling   Tongue swelling   Naproxen Other (See Comments)   Unknown reaction   Pravastatin Other (See Comments)   Myalgias.   Prednisone Itching, Swelling, Other (See Comments)   Toes numb, itching and swelling on inside of mouth   Clindamycin/lincomycin Rash   Lovastatin Hives, Rash      Medication List    TAKE these medications   acetaminophen 650 MG CR tablet Commonly known as: TYLENOL Take 1,300 mg by mouth as needed for pain.   amLODipine 5 MG tablet Commonly known as: NORVASC Take 1 tablet (5 mg total) by mouth daily. Annual appt due in May must see provider for future refills   aspirin 325 MG EC tablet Take 1 tablet (325 mg total) by mouth daily.   clopidogrel 75 MG tablet Commonly known as: PLAVIX TAKE 1 TABLET EVERY DAY   ezetimibe 10 MG tablet Commonly known as: Zetia Take 1 tablet (10 mg total) by mouth daily.   furosemide 40 MG tablet Commonly known as: LASIX Take 0.5 tablets (20 mg total) by mouth daily as needed. Annual appt due in January must see provider for future refills   metoprolol succinate 100 MG 24 hr tablet Commonly known as: TOPROL-XL TAKE 1 TABLET ONCE DAILY WITH OR IMMEDIATELY FOLLOWING A MEAL. Annual appt due in May must see provider for future refills What changed:   how much to  take  how to take this  when to take this  additional instructions   PATADAY OP Place 1 drop into both eyes daily as needed (For dry eyes).   potassium chloride 10 MEQ tablet Commonly known as: KLOR-CON Take 1 tablet (10 mEq total) by mouth daily. Annual appt due in May must see provider for future refills   Vitamin D 50 MCG (2000 UT) Caps Take 2,000 Units by mouth daily.       Follow-up Information    Plotnikov, Georgina QuintAleksei V, MD Follow up.   Specialty: Internal Medicine Contact information: [address] [place] [address] [phone]        Doristine LocksCirigliano, Vito V, DO Follow up.   Specialty: Gastroenterology Contact information: [address] [place] [address] [phone]  Allergies  Allergen Reactions  . Crestor [Rosuvastatin Calcium] Other (See Comments)    myalgias  . Doxycycline Other (See Comments)    Unknown reaction  . Lipitor [Atorvastatin] Other (See Comments)    Myalgias/feet pain  . Lisinopril Swelling    Tongue swelling  . Naproxen Other (See Comments)    Unknown reaction  . Pravastatin Other (See Comments)    Myalgias.  . Prednisone Itching, Swelling and Other (See Comments)    Toes numb, itching and swelling on inside of mouth  . Clindamycin/Lincomycin Rash  . Lovastatin Hives and Rash    Consultations:  GI   Procedures/Studies: EGD Colonoscopy   Subjective: Feels well today.  She is without complaint after her procedure this morning.  Her diet has been advanced without difficulty.  Discharge Exam: Vitals:   07/07/20 0932 07/07/20 1153  BP: (!) 156/92 133/74  Pulse: 89 78  Resp: 18 18  Temp: 97.6 F (36.4 C) 98.3 F (36.8 C)  SpO2: 97% 97%   Vitals:   07/07/20 0900 07/07/20 0910 07/07/20 0932 07/07/20 1153  BP: (!) 186/87 (!) 182/85 (!) 156/92 133/74  Pulse: 91 84 89 78  Resp: (!) 24 14 18 18   Temp:   97.6 F (36.4 C) 98.3 F (36.8 C)  TempSrc:   Oral Oral  SpO2: 100% 96%  97% 97%  Weight:      Height:        General: Pt is alert, awake, not in acute distress Cardiovascular: RRR, S1/S2 +, no rubs, no gallops Respiratory: CTA bilaterally, no wheezing, no rhonchi Abdominal: Soft, NT, ND, bowel sounds + Extremities: no edema, no cyanosis    The results of significant diagnostics from this hospitalization (including imaging, microbiology, ancillary and laboratory) are listed below for reference.     Microbiology: Recent Results (from the past 240 hour(s))  Resp Panel by RT-PCR (Flu A&B, Covid) Nasopharyngeal Swab     Status: None   Collection Time: 07/04/20  2:27 PM   Specimen: Nasopharyngeal Swab; Nasopharyngeal(NP) swabs in vial transport medium  Result Value Ref Range Status   SARS Coronavirus 2 by RT PCR NEGATIVE NEGATIVE Final    Comment: (NOTE) SARS-CoV-2 target nucleic acids are NOT DETECTED.  The SARS-CoV-2 RNA is generally detectable in upper respiratory specimens during the acute phase of infection. The lowest concentration of SARS-CoV-2 viral copies this assay can detect is 138 copies/mL. A negative result does not preclude SARS-Cov-2 infection and should not be used as the sole basis for treatment or other patient management decisions. A negative result may occur with  improper specimen collection/handling, submission of specimen other than nasopharyngeal swab, presence of viral mutation(s) within the areas targeted by this assay, and inadequate number of viral copies(<138 copies/mL). A negative result must be combined with clinical observations, patient history, and epidemiological information. The expected result is Negative.  Fact Sheet for Patients:  09/03/20  Fact Sheet for Healthcare Providers:  BloggerCourse.com  This test is no t yet approved or cleared by the [place] FDA and  has been authorized for detection and/or diagnosis of SARS-CoV-2 by FDA under an  Emergency Use Authorization (EUA). This EUA will remain  in effect (meaning this test can be used) for the duration of the COVID-19 declaration under Section 564(b)(1) of the Act, 21 U.S.C.section 360bbb-3(b)(1), unless the authorization is terminated  or revoked sooner.       Influenza A by PCR NEGATIVE NEGATIVE Final   Influenza B by PCR NEGATIVE NEGATIVE Final  Comment: (NOTE) The Xpert Xpress SARS-CoV-2/FLU/RSV plus assay is intended as an aid in the diagnosis of influenza from Nasopharyngeal swab specimens and should not be used as a sole basis for treatment. Nasal washings and aspirates are unacceptable for Xpert Xpress SARS-CoV-2/FLU/RSV testing.  Fact Sheet for Patients: BloggerCourse.com  Fact Sheet for Healthcare Providers: SeriousBroker.it  This test is not yet approved or cleared by the [place] FDA and has been authorized for detection and/or diagnosis of SARS-CoV-2 by FDA under an Emergency Use Authorization (EUA). This EUA will remain in effect (meaning this test can be used) for the duration of the COVID-19 declaration under Section 564(b)(1) of the Act, 21 U.S.C. section 360bbb-3(b)(1), unless the authorization is terminated or revoked.  Performed at [facility], [address], [place], [place] [address]      Labs: Basic Metabolic Panel: Recent Labs  Lab 07/04/20 1236 07/05/20 0709 07/06/20 0507 07/07/20 0240  NA 142 139 140 142  K 3.8 3.4* 3.3* 3.6  CL 108 106 108 115*  CO2 21* 24 24 19*  GLUCOSE 130* 129* 91 105*  BUN 14 11 11 11   CREATININE 1.23* 1.11* 1.10* 1.10*  CALCIUM 9.9 9.2 9.0 9.5   Liver Function Tests: Recent Labs  Lab 07/04/20 1236  AST 19  ALT 19  ALKPHOS 79  BILITOT 0.4  PROT 7.1  ALBUMIN 3.8   Recent Labs  Lab 07/04/20 1236  LIPASE 30   CBC: Recent Labs  Lab 07/05/20 0709 07/05/20 1658 07/06/20 0507 07/06/20 1656 07/07/20 0240  WBC 7.2 6.5 7.3  6.9 8.7  NEUTROABS  --   --  4.5  --  7.1  HGB 9.3* 9.3* 8.9* 9.8* 9.8*  HCT 29.7* 30.4* 28.9* 31.7* 31.6*  MCV 75.6* 75.8* 75.1* 75.8* 76.1*  PLT 391 359 360 380 378   Anemia work up Recent Labs    07/06/20 0507 07/06/20 1014  VITAMINB12  --  249  FOLATE  --  6.2  FERRITIN  --  4*  TIBC  --  456*  IRON  --  32  RETICCTPCT 2.1  --    Urinalysis    Component Value Date/Time   COLORURINE STRAW (A) 07/04/2020 1303   APPEARANCEUR CLEAR 07/04/2020 1303   LABSPEC 1.004 (L) 07/04/2020 1303   PHURINE 7.0 07/04/2020 1303   GLUCOSEU NEGATIVE 07/04/2020 1303   GLUCOSEU NEGATIVE 04/30/2007 0724   HGBUR SMALL (A) 07/04/2020 1303   BILIRUBINUR NEGATIVE 07/04/2020 1303   BILIRUBINUR negative 10/28/2017 1834   BILIRUBINUR clear 07/28/2014 0914   KETONESUR NEGATIVE 07/04/2020 1303   PROTEINUR NEGATIVE 07/04/2020 1303   UROBILINOGEN 0.2 10/28/2017 1834   UROBILINOGEN 0.2 mg/dL 10/30/2017 15/83/0940   NITRITE NEGATIVE 07/04/2020 1303   LEUKOCYTESUR NEGATIVE 07/04/2020 1303   Sepsis Labs Microbiology Recent Results (from the past 240 hour(s))  Resp Panel by RT-PCR (Flu A&B, Covid) Nasopharyngeal Swab     Status: None   Collection Time: 07/04/20  2:27 PM   Specimen: Nasopharyngeal Swab; Nasopharyngeal(NP) swabs in vial transport medium  Result Value Ref Range Status   SARS Coronavirus 2 by RT PCR NEGATIVE NEGATIVE Final    Comment: (NOTE) SARS-CoV-2 target nucleic acids are NOT DETECTED.  The SARS-CoV-2 RNA is generally detectable in upper respiratory specimens during the acute phase of infection. The lowest concentration of SARS-CoV-2 viral copies this assay can detect is 138 copies/mL. A negative result does not preclude SARS-Cov-2 infection and should not be used as the sole basis for treatment or  other patient management decisions. A negative result may occur with  improper specimen collection/handling, submission of specimen other than nasopharyngeal swab, presence of viral  mutation(s) within the areas targeted by this assay, and inadequate number of viral copies(<138 copies/mL). A negative result must be combined with clinical observations, patient history, and epidemiological information. The expected result is Negative.  Fact Sheet for Patients:  BloggerCourse.com  Fact Sheet for Healthcare Providers:  [employer]iousBroker.it  This test is no t yet approved or cleared by the [place] FDA and  has been authorized for detection and/or diagnosis of SARS-CoV-2 by FDA under an Emergency Use Authorization (EUA). This EUA will remain  in effect (meaning this test can be used) for the duration of the COVID-19 declaration under Section 564(b)(1) of the Act, 21 U.S.C.section 360bbb-3(b)(1), unless the authorization is terminated  or revoked sooner.       Influenza A by PCR NEGATIVE NEGATIVE Final   Influenza B by PCR NEGATIVE NEGATIVE Final    Comment: (NOTE) The Xpert Xpress SARS-CoV-2/FLU/RSV plus assay is intended as an aid in the diagnosis of influenza from Nasopharyngeal swab specimens and should not be used as a sole basis for treatment. Nasal washings and aspirates are unacceptable for Xpert Xpress SARS-CoV-2/FLU/RSV testing.  Fact Sheet for Patients: BloggerCourse.com  Fact Sheet for Healthcare Providers: SeriousBroker.it  This test is not yet approved or cleared by the [place] FDA and has been authorized for detection and/or diagnosis of SARS-CoV-2 by FDA under an Emergency Use Authorization (EUA). This EUA will remain in effect (meaning this test can be used) for the duration of the COVID-19 declaration under Section 564(b)(1) of the Act, 21 U.S.C. section 360bbb-3(b)(1), unless the authorization is terminated or revoked.  Performed at [facility], [address], [place], [place] [address]      Time coordinating discharge:  Over 30 minutes  SIGNED:   Reva Bores, MD  [facility] 07/07/2020, 3:33 PM  If 7PM-7AM, please contact night-coverage

## 2020-07-07 NOTE — Anesthesia Procedure Notes (Signed)
Procedure Name: MAC Date/Time: 07/07/2020 7:34 AM Performed by: Lowella Dell, CRNA Pre-anesthesia Checklist: Patient identified, Emergency Drugs available, Suction available, Patient being monitored and Timeout performed Patient Re-evaluated:Patient Re-evaluated prior to induction Oxygen Delivery Method: Nasal cannula Placement Confirmation: positive ETCO2 Dental Injury: Teeth and Oropharynx as per pre-operative assessment

## 2020-07-07 NOTE — Telephone Encounter (Signed)
-----   Message from [facility] V, DO sent at 07/07/2020  8:53 AM EDT ----- Patient should be discharging today or tomorrow and needs the following: -Follow-up with Dr. Myrtie Neither or one of the APP's in 6 weeks or so -Repeat CBC 7-10 days after hospital discharge

## 2020-07-07 NOTE — Op Note (Signed)
[facility] Patient Name: Judy Carlson Procedure Date : 07/07/2020 MRN: [id number] Attending MD: Doristine Locks , MD Date of Birth: 25-Nov-1950 CSN: [id number] Age: 70 Admit Type: Inpatient Procedure:                Upper GI endoscopy Indications:              Upper abdominal pain, Iron deficiency anemia Providers:                Doristine Locks, MD, Rogue Jury, RN, Sunday Corn                            [place], Technician Referring MD:              Medicines:                Monitored Anesthesia Care Complications:            No immediate complications. Estimated Blood Loss:     Estimated blood loss was minimal. Procedure:                Pre-Anesthesia Assessment:                           - Prior to the procedure, a History and Physical                            was performed, and patient medications and                            allergies were reviewed. The patient's tolerance of                            previous anesthesia was also reviewed. The risks                            and benefits of the procedure and the sedation                            options and risks were discussed with the patient.                            All questions were answered, and informed consent                            was obtained. Prior Anticoagulants: The patient has                            taken Plavix (clopidogrel), last dose was 4 days                            prior to procedure. ASA Grade Assessment: III - A                            patient with severe systemic disease. After  reviewing the risks and benefits, the patient was                            deemed in satisfactory condition to undergo the                            procedure.                           After obtaining informed consent, the endoscope was                            passed under direct vision. Throughout the                            procedure, the patient's blood  pressure, pulse, and                            oxygen saturations were monitored continuously. The                            GIF-[id number] ([id number] Olympus gastroscope was                            introduced through the mouth, and advanced to the                            second part of duodenum. The upper GI endoscopy was                            accomplished without difficulty. The patient                            tolerated the procedure well. Scope In: Scope Out: Findings:      The examined esophagus was normal.      Moderate inflammation characterized by congestion (edema), erythema and       shallow ulcerations was found in the gastric antrum. Biopsies were taken       with a cold forceps for histology. Estimated blood loss was minimal.      Mild inflammation characterized by congestion (edema) and erythema was       found in the gastric fundus and in the gastric body. Biopsies were taken       with a cold forceps for histology. Estimated blood loss was minimal.      Localized moderate inflammation characterized by congestion (edema),       erythema and shallow ulcerations was found in the duodenal bulb.       Biopsies were taken with a cold forceps for histology. Estimated blood       loss was minimal.      The second portion of the duodenum was normal. Biopsies for histology       were taken with a cold forceps for evaluation of celiac disease.       Estimated blood loss was minimal. Impression:               - Normal esophagus.                           -  Gastritis with shallow, non-bleeding ulcers in                            the antrum. No endoscopic intervention needed based                            on these findings. This was biopsied.                           - Mild gastritis in the gastric body/fundus.                            Biopsied.                           - Duodenitis limited to the duodenla bulb and                            characterized by erythema,  edema, and shallow                            ulceration. Biopsied.                           - Normal second portion of the duodenum. Biopsied. Recommendation:           - Return patient to hospital ward for possible                            discharge same day.                           - Advance diet as tolerated.                           - Continue present medications.                           - Resume Plavix (clopidogrel) at prior dose in 2                            days.                           - Await pathology results.                           - Use Protonix (pantoprazole) 40 mg PO BID for 8                            weeks.                           - Repeat CBC in 7-10 days as an outpatient along                            with repeat CBC and iron  panel in 3 months.                           - Will arrange for follow-up in the GI clinic. Procedure Code(s):        --- Professional ---                           [phone], Esophagogastroduodenoscopy, flexible,                            transoral; with biopsy, single or multiple Diagnosis Code(s):        --- Professional ---                           K29.70, Gastritis, unspecified, without bleeding                           K29.80, Duodenitis without bleeding                           R10.10, Upper abdominal pain, unspecified                           D50.9, Iron deficiency anemia, unspecified CPT copyright 2019 American Medical Association. All rights reserved. The codes documented in this report are preliminary and upon coder review may  be revised to meet current compliance requirements. Doristine Locks, MD 07/07/2020 8:42:49 AM Number of Addenda: 0

## 2020-07-07 NOTE — Progress Notes (Signed)
AVS reviewed with patient. All questions answered at this time. Transportation provided by family.   Judy Carlson

## 2020-07-08 ENCOUNTER — Other Ambulatory Visit: Payer: Self-pay | Admitting: Internal Medicine

## 2020-07-08 ENCOUNTER — Encounter (HOSPITAL_COMMUNITY): Payer: Self-pay | Admitting: Gastroenterology

## 2020-07-08 LAB — SURGICAL PATHOLOGY

## 2020-07-08 NOTE — Telephone Encounter (Signed)
Left a voicemail for the patient to contact the office regarding labs and scheduled appt with Dr Myrtie Neither.

## 2020-07-11 ENCOUNTER — Telehealth: Payer: Self-pay | Admitting: General Surgery

## 2020-07-11 NOTE — Telephone Encounter (Signed)
They patient is allergic to doxycycline need another medication for her therapy. Please advise. The patient stated to send the medication to [facility].

## 2020-07-11 NOTE — Telephone Encounter (Signed)
-----   Message from Judy Carlson V, DO sent at 07/08/2020  6:40 PM EDT ----- Please call this patient to inform her that the biopsies taken during her inpatient upper endoscopy were notable for H. Pylori gastritis, and will plan on treating with quad therapy as below. Please confirm no medication allergies to the prescribed regimen.   1) Continue Protonix as prescribed at the time of hospital discharge 2) Pepto Bismol 2 tabs (262 mg each) 4 times a day x 14 d 3) Metronidazole 250 mg 4 times a day x 14 d 4) doxycycline 100 mg 2 times a day x 14 d   4 weeks after treatment completed, check H. Pylori stool antigen to confirm eradication (must be off acid suppression therapy)  Dx: H. Pylori gastritis  The polyps taken during the colonoscopy were benign Hyperplastic Polyps.  These do not require any short interval surveillance or close follow-up.  Based on age and current guidelines, no recall needed.   

## 2020-07-11 NOTE — Telephone Encounter (Signed)
-----   Message from Vito Cirigliano V, DO sent at 07/08/2020  6:40 PM EDT ----- Please call this patient to inform her that the biopsies taken during her inpatient upper endoscopy were notable for H. Pylori gastritis, and will plan on treating with quad therapy as below. Please confirm no medication allergies to the prescribed regimen.   1) Continue Protonix as prescribed at the time of hospital discharge 2) Pepto Bismol 2 tabs (262 mg each) 4 times a day x 14 d 3) Metronidazole 250 mg 4 times a day x 14 d 4) doxycycline 100 mg 2 times a day x 14 d   4 weeks after treatment completed, check H. Pylori stool antigen to confirm eradication (must be off acid suppression therapy)  Dx: H. Pylori gastritis  The polyps taken during the colonoscopy were benign Hyperplastic Polyps.  These do not require any short interval surveillance or close follow-up.  Based on age and current guidelines, no recall needed.   

## 2020-07-11 NOTE — Telephone Encounter (Signed)
-----   Message from [place] V, DO sent at 07/08/2020  6:40 PM EDT ----- Please call this patient to inform her that the biopsies taken during her inpatient upper endoscopy were notable for H. Pylori gastritis, and will plan on treating with quad therapy as below. Please confirm no medication allergies to the prescribed regimen.   1) Continue Protonix as prescribed at the time of hospital discharge 2) Pepto Bismol 2 tabs (262 mg each) 4 times a day x 14 d 3) Metronidazole 250 mg 4 times a day x 14 d 4) doxycycline 100 mg 2 times a day x 14 d   4 weeks after treatment completed, check H. Pylori stool antigen to confirm eradication (must be off acid suppression therapy)  Dx: H. Pylori gastritis  The polyps taken during the colonoscopy were benign Hyperplastic Polyps.  These do not require any short interval surveillance or close follow-up.  Based on age and current guidelines, no recall needed.

## 2020-07-12 MED ORDER — CLARITHROMYCIN 500 MG PO TABS: 500.0000 mg | ORAL_TABLET | Freq: Two times a day (BID) | ORAL | 0 refills | Status: AC

## 2020-07-12 MED ORDER — AMOXICILLIN 500 MG PO CAPS: 500.0000 mg | ORAL_CAPSULE | Freq: Four times a day (QID) | ORAL | 0 refills | Status: DC

## 2020-07-12 MED ORDER — METRONIDAZOLE 250 MG PO TABS: 250.0000 mg | ORAL_TABLET | Freq: Four times a day (QID) | ORAL | 0 refills | Status: AC

## 2020-07-12 NOTE — Telephone Encounter (Signed)
In that case, try for:  1) PPI as prescribed 2) clarithromycin 500 mg bid x14 days 3) metronidazole 500 mg tid x14 days

## 2020-07-12 NOTE — Telephone Encounter (Signed)
Sent Clarithromycin therapy to pharmacy. Notified the patient through mychart.

## 2020-07-12 NOTE — Telephone Encounter (Signed)
Ok, in that case, will go with Clarithromycin-based concomitant therapy: -Continue Protonix as prescribed -Clarithromycin 500 mg PO BID x14 days -Amoxicillin 1 g PO BID x14 days -Metronidazole 500 mg PO BID x14 days

## 2020-07-12 NOTE — Telephone Encounter (Signed)
Inbound call from pharmacy stating they have in their system that patient is allergic to penicillin and wants to get clarification on that please.

## 2020-07-12 NOTE — Addendum Note (Signed)
Addended by: Latricia Heft A on: 07/12/2020 04:53 PM   Modules accepted: Orders

## 2020-07-12 NOTE — Telephone Encounter (Signed)
Left a message for the patient to contact me regarding her medication allergies, [facility] states she has an allergy to amoxicillin need to verify with the patient.

## 2020-07-12 NOTE — Telephone Encounter (Signed)
Called [facility]  Pharmacy spoke with the pharmacist, discontinued amoxicillin due to allergy, explained she will need the rest of the medication.

## 2020-07-12 NOTE — Telephone Encounter (Addendum)
Patient stated that she is allergic to amoxicillin. It was added to her medication list.  Is there another alternative for this patient?

## 2020-07-13 ENCOUNTER — Other Ambulatory Visit: Payer: Self-pay

## 2020-07-13 MED ORDER — PANTOPRAZOLE SODIUM 40 MG PO TBEC: 40.0000 mg | DELAYED_RELEASE_TABLET | Freq: Two times a day (BID) | ORAL | 0 refills | Status: DC

## 2020-07-14 ENCOUNTER — Ambulatory Visit (INDEPENDENT_AMBULATORY_CARE_PROVIDER_SITE_OTHER): Payer: Medicare PPO | Admitting: Internal Medicine

## 2020-07-14 ENCOUNTER — Encounter: Payer: Self-pay | Admitting: Internal Medicine

## 2020-07-14 ENCOUNTER — Other Ambulatory Visit: Payer: Self-pay

## 2020-07-14 DIAGNOSIS — N1831 Chronic kidney disease, stage 3a: Secondary | ICD-10-CM

## 2020-07-14 DIAGNOSIS — D509 Iron deficiency anemia, unspecified: Secondary | ICD-10-CM

## 2020-07-14 DIAGNOSIS — K2970 Gastritis, unspecified, without bleeding: Secondary | ICD-10-CM | POA: Diagnosis not present

## 2020-07-14 DIAGNOSIS — K269 Duodenal ulcer, unspecified as acute or chronic, without hemorrhage or perforation: Secondary | ICD-10-CM

## 2020-07-14 DIAGNOSIS — I6521 Occlusion and stenosis of right carotid artery: Secondary | ICD-10-CM

## 2020-07-14 DIAGNOSIS — K259 Gastric ulcer, unspecified as acute or chronic, without hemorrhage or perforation: Secondary | ICD-10-CM

## 2020-07-14 DIAGNOSIS — E559 Vitamin D deficiency, unspecified: Secondary | ICD-10-CM

## 2020-07-14 DIAGNOSIS — K2990 Gastroduodenitis, unspecified, without bleeding: Secondary | ICD-10-CM

## 2020-07-14 NOTE — Assessment & Plan Note (Signed)
EGD: Patient underwent EGD which revealed multiple small ulcers which were all biopsied On ASA and Plavix. D/c ASA. On Pantaprazole, Biaxin, Flagyl for H pylori 

## 2020-07-14 NOTE — Assessment & Plan Note (Signed)
D/c ASA due to GI bleed Cont w/Plavix Stop smoking - she did

## 2020-07-14 NOTE — Assessment & Plan Note (Signed)
EGD: Patient underwent EGD which revealed multiple small ulcers which were all biopsied On ASA and Plavix. D/c ASA. On Pantaprazole, Biaxin, Flagyl for H pylori

## 2020-07-14 NOTE — Assessment & Plan Note (Signed)
anemia secondary to upper GI bleeding.  -HerHgb was 6.7 status post 2 units up to 9.8 at time of discharge Check CBC

## 2020-07-14 NOTE — Progress Notes (Signed)
Subjective:  Patient ID: Judy Carlson, female    DOB: September 24, 1950  Age: 70 y.o. MRN: [id number]  CC:  HPI [name] presents for a post-hosp f/u for anemia, stomach ulcers. No black stools now. H pylori (-).  We reviewed her hospital stay, tests, labs, consultants conclusions and follow-up plans with the patient and her husband.  Per hx:  "Admit date: 07/04/2020 Discharge date: 07/07/2020  Admitted From: home Disposition:  home  Recommendations for Outpatient Follow-up:  1. Follow up with PCP in 1-2 weeks 2. Please obtain CBC in 7 to 10 days 3. 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 She would notice these things with activities around the house. She has had these symptoms periodically for a couple of months, but more frequently in the last 2 weeks. She has had to have frequent rest breaks. She walked in for the eye exam today and she got light-headed and the nurse called her husband. Last Wednesday, she tried to make a PCP appointment and wasn't able to be seen until tomorrow. She developed transient CP in the doctor's office and tinkling in her hands, concerned about stroke. She had rectal bleeding in 2017, told she had polyps and had been diagnosed with diverticulosis in 2012. She has had periodic abdominal pain with diarrhea and vomiting simultaneously, has not paid much attention to that since it is so sporadic (maybe once a  month). She has had intermittent bandlike cramping across the entire top of her stomach for the last week or so.She noticed a change in color in her stool this past Friday or Saturday - it was green or yellowish. Her stool has not been black since March - but she stopped taking iron in January. She takes only Tylenol for pain. She takes daily ASA 325 mg for her h/o PAD.  Discharge Diagnoses:  Principal Problem:   Acute upper GI bleeding Active Problems:   Essential hypertension, benign   Dyslipidemia   Glucose intolerance (impaired glucose tolerance)   CKD (chronic kidney disease) stage 3, GFR 30-59 ml/min (HCC)   Carotid stenosis   DNR (do not resuscitate)   Symptomatic anemia   Folate deficiency   Diverticulosis of colon without hemorrhage   Internal hemorrhoids   Multiple polyps of sigmoid colon   Gastritis and gastroduodenitis   Multiple gastric ulcers   Multiple duodenal ulcers   Acute GI bleeding -Patient underwent EGD which revealed multiple small ulcers which were all biopsied, colonoscopy which revealed multiple diverticula but no active bleeding.  ABLA -Patient's lightheadedness and fatigue are most likely caused by anemia secondary to upper GI bleeding.  -HerHgb was 6.7 status post 2 units up to 9.8 at time of discharge - patient received IV iron prior to discharge as well per GI recommendations.  PVD -s/p CEA -Takes 325 mg ASAand Plavix daily; both are currently on hold -Per GI may resume aspirin tomorrow, and Plavix in 2 days.  HTN -Continue Norvasc, Toprol XL  HLD -Hold Zetia due to limited inpatient utility -Has reported  allergy to statins  IGT -Minimally elevated glucose currently -Not on home medications -Will monitor with fasting glucose for now  Stage 3a CKD -Slightly worse than prior baseline, back down to 1.1 at time of discharge. -Status post IV fluid hydration  Tobacco dependence -Reports minimal smoking at this time, praise  and ongoing cessation encouraged"   Outpatient Medications Prior to Visit  Medication Sig Dispense Refill  . acetaminophen (TYLENOL) 650 MG CR tablet Take 1,300 mg by mouth as needed for pain.     Marland Kitchen amLODipine (NORVASC) 5 MG tablet Take 1 tablet (5 mg total) by mouth daily. Annual appt due in May must see provider for future refills 90 tablet 0  . aspirin EC 325 MG EC tablet Take 1 tablet (325 mg total) by mouth daily. 30 tablet 0  . Cholecalciferol (VITAMIN D) 2000 UNITS CAPS Take 2,000 Units by mouth daily.    . clarithromycin (BIAXIN) 500 MG tablet Take 1 tablet (500 mg total) by mouth 2 (two) times daily for 14 days. 28 tablet 0  . clopidogrel (PLAVIX) 75 MG tablet TAKE 1 TABLET EVERY DAY (Patient taking differently: Take 75 mg by mouth daily.) 90 tablet 1  . ezetimibe (ZETIA) 10 MG tablet Take 1 tablet (10 mg total) by mouth daily. 90 tablet 3  . furosemide (LASIX) 40 MG tablet Take 0.5 tablets (20 mg total) by mouth daily as needed. Annual appt due in January must see provider for future refills 90 tablet 0  . metoprolol succinate (TOPROL-XL) 100 MG 24 hr tablet TAKE 1 TABLET ONCE DAILY WITH OR IMMEDIATELY FOLLOWING A MEAL. Annual appt due in May must see provider for future refills (Patient taking differently: Take 100 mg by mouth daily.) 90 tablet 0  . metroNIDAZOLE (FLAGYL) 250 MG tablet Take 1 tablet (250 mg total) by mouth 4 (four) times daily for 14 days. 56 tablet 0  . Olopatadine HCl (PATADAY OP) Place 1 drop into both eyes daily as needed (For dry eyes).    . pantoprazole (PROTONIX) 40 MG tablet Take 1 tablet (40 mg total) by mouth 2 (two) times daily for 14 days. 28 tablet 0  . potassium chloride (KLOR-CON) 10 MEQ tablet Take 1 tablet (10 mEq total) by mouth daily. Annual appt due in May must see provider for future refills 90 tablet 0   No facility-administered medications prior to visit.    ROS: Review of Systems  Constitutional: Positive for fatigue. Negative for activity  change, appetite change, chills and unexpected weight change.  HENT: Negative for congestion, mouth sores and sinus pressure.   Eyes: Negative for visual disturbance.  Respiratory: Negative for cough and chest tightness.   Gastrointestinal: Negative for abdominal pain, anal bleeding, blood in stool, diarrhea, nausea and vomiting.  Genitourinary: Negative for difficulty urinating, frequency and vaginal pain.  Musculoskeletal: Negative for back pain and gait problem.  Skin: Negative for pallor and rash.  Neurological: Negative for dizziness, tremors, weakness, numbness and headaches.  Psychiatric/Behavioral: Negative for confusion and sleep disturbance.    Objective:  BP 139/72 (BP Location: Left Arm)   Pulse 78   Temp 98.6 F (37 C) (Oral)   Ht 5\' 5"  (1.651 m)   Wt 176 lb 9.6 oz (80.1 kg)   SpO2 98%   BMI 29.39 kg/m   BP Readings from Last 3 Encounters:  07/14/20 139/72  07/07/20 (!) 148/79  01/12/20 136/76    Wt Readings from Last 3 Encounters:  07/14/20 176 lb 9.6 oz (80.1 kg)  07/07/20 188 lb (85.3 kg)  01/12/20 181 lb (82.1 kg)    Physical Exam Constitutional:      General: She is not in acute distress.    Appearance: She is well-developed.  HENT:     Head: Normocephalic.     Right Ear: External ear normal.     Left Ear: External ear normal.     Nose: Nose normal.  Eyes:     General:        Right eye: No discharge.        Left eye: No discharge.     Conjunctiva/sclera: Conjunctivae normal.     Pupils: Pupils are equal, round, and reactive to light.  Neck:     Thyroid: No thyromegaly.     Vascular: No JVD.     Trachea: No tracheal deviation.  Cardiovascular:     Rate and Rhythm: Normal rate and regular rhythm.     Heart sounds: Normal heart sounds.  Pulmonary:     Effort: No respiratory distress.     Breath sounds: No stridor. No wheezing.  Abdominal:     General: Bowel sounds are normal. There is no distension.     Palpations: Abdomen is soft. There is  no mass.     Tenderness: There is no abdominal tenderness. There is no guarding or rebound.  Musculoskeletal:        General: No tenderness.     Cervical back: Normal range of motion and neck supple.  Lymphadenopathy:     Cervical: No cervical adenopathy.  Skin:    Findings: No erythema or rash.  Neurological:     Cranial Nerves: No cranial nerve deficit.     Motor: No abnormal muscle tone.     Coordination: Coordination normal.     Deep Tendon Reflexes: Reflexes normal.  Psychiatric:        Behavior: Behavior normal.        Thought Content: Thought content normal.        Judgment: Judgment normal.     Lab Results  Component Value Date   WBC 8.7 07/07/2020   HGB 9.8 (L) 07/07/2020   HCT 31.6 (L) 07/07/2020   PLT 378 07/07/2020   GLUCOSE 105 (H) 07/07/2020   CHOL 242 (H) 12/24/2018   TRIG 101 12/24/2018   HDL 62 12/24/2018   LDLDIRECT 132.9 04/30/2007   LDLCALC 160 (H) 12/24/2018   ALT 19 07/04/2020   AST 19 07/04/2020   NA 142 07/07/2020   K 3.6 07/07/2020   CL 115 (H) 07/07/2020   CREATININE 1.10 (H) 07/07/2020   BUN 11 07/07/2020   CO2 19 (L) 07/07/2020   TSH 2.41 03/19/2019   INR 1.0 01/07/2019   HGBA1C 5.8 (H) 12/24/2018    No results found.  Assessment & Plan:    Sonda Primes, MD

## 2020-07-14 NOTE — Assessment & Plan Note (Signed)
On Vit D 

## 2020-07-14 NOTE — Assessment & Plan Note (Signed)
Monitor BMET. 

## 2020-07-15 ENCOUNTER — Other Ambulatory Visit: Payer: Self-pay | Admitting: Internal Medicine

## 2020-07-17 ENCOUNTER — Encounter: Payer: Self-pay | Admitting: Internal Medicine

## 2020-07-20 ENCOUNTER — Other Ambulatory Visit (INDEPENDENT_AMBULATORY_CARE_PROVIDER_SITE_OTHER): Payer: Medicare PPO

## 2020-07-20 DIAGNOSIS — N1831 Chronic kidney disease, stage 3a: Secondary | ICD-10-CM | POA: Diagnosis not present

## 2020-07-20 DIAGNOSIS — D509 Iron deficiency anemia, unspecified: Secondary | ICD-10-CM

## 2020-07-20 DIAGNOSIS — K269 Duodenal ulcer, unspecified as acute or chronic, without hemorrhage or perforation: Secondary | ICD-10-CM

## 2020-07-20 LAB — BASIC METABOLIC PANEL
BUN: 21 mg/dL (ref 6–23)
CO2: 24 mEq/L (ref 19–32)
Calcium: 9.5 mg/dL (ref 8.4–10.5)
Chloride: 105 mEq/L (ref 96–112)
Creatinine, Ser: 1.32 mg/dL — ABNORMAL HIGH (ref 0.40–1.20)
GFR: 41.04 mL/min — ABNORMAL LOW (ref >60.00)
Glucose, Bld: 117 mg/dL — ABNORMAL HIGH (ref 70–99)
Potassium: 3.8 mEq/L (ref 3.5–5.1)
Sodium: 140 mEq/L (ref 135–145)

## 2020-07-20 LAB — CBC WITH DIFFERENTIAL/PLATELET
Basophils Absolute: 0.1 10*3/uL (ref 0.0–0.1)
Basophils Relative: 1.1 % (ref 0.0–3.0)
Eosinophils Absolute: 0.1 10*3/uL (ref 0.0–0.7)
Eosinophils Relative: 1 % (ref 0.0–5.0)
HCT: 32.1 % — ABNORMAL LOW (ref 36.0–46.0)
Hemoglobin: 10.4 g/dL — ABNORMAL LOW (ref 12.0–15.0)
Lymphocytes Relative: 11.9 % — ABNORMAL LOW (ref 12.0–46.0)
Lymphs Abs: 1.3 10*3/uL (ref 0.7–4.0)
MCHC: 32.4 g/dL (ref 30.0–36.0)
MCV: 74.4 fl — ABNORMAL LOW (ref 78.0–100.0)
Monocytes Absolute: 1.1 10*3/uL — ABNORMAL HIGH (ref 0.1–1.0)
Monocytes Relative: 10.9 % (ref 3.0–12.0)
Neutro Abs: 7.9 10*3/uL — ABNORMAL HIGH (ref 1.4–7.7)
Neutrophils Relative %: 75.1 % (ref 43.0–77.0)
Platelets: 526 10*3/uL — ABNORMAL HIGH (ref 150.0–400.0)
RBC: 4.31 Mil/uL (ref 3.87–5.11)
RDW: 29.1 % — ABNORMAL HIGH (ref 11.5–15.5)
WBC: 10.5 10*3/uL (ref 4.0–10.5)

## 2020-07-21 ENCOUNTER — Other Ambulatory Visit: Payer: Self-pay | Admitting: Internal Medicine

## 2020-07-26 ENCOUNTER — Other Ambulatory Visit: Payer: Self-pay | Admitting: Gastroenterology

## 2020-08-11 ENCOUNTER — Other Ambulatory Visit (INDEPENDENT_AMBULATORY_CARE_PROVIDER_SITE_OTHER): Payer: Medicare PPO

## 2020-08-11 ENCOUNTER — Ambulatory Visit (INDEPENDENT_AMBULATORY_CARE_PROVIDER_SITE_OTHER): Payer: Medicare PPO | Admitting: Gastroenterology

## 2020-08-11 ENCOUNTER — Encounter: Payer: Self-pay | Admitting: Gastroenterology

## 2020-08-11 VITALS — BP 154/90 | HR 81 | Ht 65.0 in | Wt 177.2 lb

## 2020-08-11 DIAGNOSIS — D500 Iron deficiency anemia secondary to blood loss (chronic): Secondary | ICD-10-CM | POA: Diagnosis not present

## 2020-08-11 DIAGNOSIS — R195 Other fecal abnormalities: Secondary | ICD-10-CM

## 2020-08-11 DIAGNOSIS — A048 Other specified bacterial intestinal infections: Secondary | ICD-10-CM

## 2020-08-11 LAB — CBC WITH DIFFERENTIAL/PLATELET
Basophils Absolute: 0.1 10*3/uL (ref 0.0–0.1)
Basophils Relative: 1.3 % (ref 0.0–3.0)
Eosinophils Absolute: 0.1 10*3/uL (ref 0.0–0.7)
Eosinophils Relative: 1.9 % (ref 0.0–5.0)
HCT: 34.9 % — ABNORMAL LOW (ref 36.0–46.0)
Hemoglobin: 11.2 g/dL — ABNORMAL LOW (ref 12.0–15.0)
Lymphocytes Relative: 22.9 % (ref 12.0–46.0)
Lymphs Abs: 1.3 10*3/uL (ref 0.7–4.0)
MCHC: 31.9 g/dL (ref 30.0–36.0)
MCV: 75.1 fl — ABNORMAL LOW (ref 78.0–100.0)
Monocytes Absolute: 0.6 10*3/uL (ref 0.1–1.0)
Monocytes Relative: 9.8 % (ref 3.0–12.0)
Neutro Abs: 3.7 10*3/uL (ref 1.4–7.7)
Neutrophils Relative %: 64.1 % (ref 43.0–77.0)
Platelets: 333 10*3/uL (ref 150.0–400.0)
RBC: 4.65 Mil/uL (ref 3.87–5.11)
RDW: 29.7 % — ABNORMAL HIGH (ref 11.5–15.5)
WBC: 5.8 10*3/uL (ref 4.0–10.5)

## 2020-08-11 LAB — IBC + FERRITIN
Ferritin: 16.1 ng/mL (ref 10.0–291.0)
Iron: 59 ug/dL (ref 42–145)
Saturation Ratios: 13.6 % — ABNORMAL LOW (ref 20.0–50.0)
Transferrin: 311 mg/dL (ref 212.0–360.0)

## 2020-08-11 MED ORDER — FERROUS SULFATE 325 (65 FE) MG PO TABS: 325.0000 mg | ORAL_TABLET | Freq: Every day | ORAL | 1 refills | Status: DC

## 2020-08-11 MED ORDER — OMEPRAZOLE 40 MG PO CPDR: 40.0000 mg | DELAYED_RELEASE_CAPSULE | Freq: Every day | ORAL | 0 refills | Status: DC
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
## 2020-08-11 NOTE — Patient Instructions (Addendum)
If you are age 70 or older, your body mass index should be between 23-30. Your Body mass index is 29.5 kg/m. If this is out of the aforementioned range listed, please consider follow up with your Primary Care Provider.  If you are age 14 or younger, your body mass index should be between 19-25. Your Body mass index is 29.5 kg/m. If this is out of the aformentioned range listed, please consider follow up with your Primary Care Provider.   __________________________________________________________  The  GI providers would like to encourage you to use [facility] to communicate with providers for non-urgent requests or questions.  Due to long hold times on the telephone, sending your provider a message by [facility] may be a faster and more efficient way to get a response.  Please allow 48 business hours for a response.  Please remember that this is for non-urgent requests.    Your provider has requested that you go to the basement level for lab work before leaving today. Press "B" on the elevator. The lab is located at the first door on the left as you exit the elevator.  Please start Iron 325mg  one a day

## 2020-08-16 ENCOUNTER — Ambulatory Visit: Payer: Medicare PPO | Admitting: Gastroenterology

## 2020-08-17 ENCOUNTER — Other Ambulatory Visit: Payer: Self-pay | Admitting: Internal Medicine

## 2020-08-23 ENCOUNTER — Ambulatory Visit (INDEPENDENT_AMBULATORY_CARE_PROVIDER_SITE_OTHER): Payer: Medicare PPO | Admitting: Gastroenterology

## 2020-08-23 DIAGNOSIS — D500 Iron deficiency anemia secondary to blood loss (chronic): Secondary | ICD-10-CM | POA: Diagnosis not present

## 2020-08-23 DIAGNOSIS — D508 Other iron deficiency anemias: Secondary | ICD-10-CM

## 2020-08-23 NOTE — Progress Notes (Signed)
Pt here for capsule endo, prep completed. Pt swallowed capsule without difficulty.   Capsule ID [id number] IWP#[id number] Exp: 01/10/22  Pt came in at 4pm to have monitor removed. She did not have any difficulty today with the monitor.

## 2020-09-02 ENCOUNTER — Other Ambulatory Visit: Payer: Self-pay | Admitting: Gastroenterology

## 2020-09-07 ENCOUNTER — Telehealth: Payer: Self-pay | Admitting: Gastroenterology

## 2020-09-07 NOTE — Telephone Encounter (Signed)
Dr. Lenell Antu,  This patient had a right CEA with you in November 2021, and was on DAPT with aspirin and Plavix until a recent hospital admission.  She had iron deficiency anemia and underwent EGD and colonoscopy.  EGD revealed several small shallow nonbleeding gastric ulcers, and a subsequent outpatient video capsule study revealed an additional diminutive shallow nonbleeding duodenal ulcer.  She was found to be H. pylori positive and treated with antibiotics.  Her aspirin has been held for about the last 8 weeks but she remains on Plavix.  I would appreciate your opinion on whether or not this patient requires combination therapy with aspirin and Plavix or if she can be on Plavix monotherapy.  If the former, then we will ask her to resume 81 mg enteric-coated aspirin and keep her on PPI indefinitely. We will also be checking to ensure H. pylori eradication.  Thanks very much.   Amada Jupiter, MD    [facility]

## 2020-09-08 NOTE — Telephone Encounter (Signed)
Spoke with patient to clarify information. She states that she completed the antibiotics for H. Pylori on 08/27/20. She is aware that she will be due for breath test the week of 09/26/20. She is aware that she will need to begin holding omeprazole on 09/12/20 and can resume after she completes the breath test. She will receive a reminder via my chart regarding breath test. Patient verbalized understanding of all information and had no concerns at the end of the call.

## 2020-09-09 ENCOUNTER — Other Ambulatory Visit: Payer: Self-pay

## 2020-09-09 DIAGNOSIS — D508 Other iron deficiency anemias: Secondary | ICD-10-CM

## 2020-09-09 DIAGNOSIS — A048 Other specified bacterial intestinal infections: Secondary | ICD-10-CM

## 2020-09-09 DIAGNOSIS — D500 Iron deficiency anemia secondary to blood loss (chronic): Secondary | ICD-10-CM

## 2020-09-09 NOTE — Telephone Encounter (Addendum)
She only needs to hold the omeprazole 5 days prior to the breath test.  Also, when she goes for the breath test, I would like her to have a CBC and iron studies  I heard back from her vascular surgeon regarding the plan for aspirin and her Plavix.  Dr. Lenell Antu says she does not require both meds anymore, and he recommended 81 mg aspirin daily.  However, since aspirin most likely contributed to the ulcers, I think it would better if she took just Plavix daily without aspirin as she has been doing since the hospital stay.  - HD

## 2020-09-20 ENCOUNTER — Ambulatory Visit: Payer: Medicare PPO

## 2020-09-20 ENCOUNTER — Other Ambulatory Visit: Payer: Self-pay

## 2020-09-20 ENCOUNTER — Ambulatory Visit: Payer: Medicare PPO | Attending: Internal Medicine

## 2020-09-20 DIAGNOSIS — Z23 Encounter for immunization: Secondary | ICD-10-CM

## 2020-09-20 MED ORDER — COVID-19 MRNA VAC-TRIS(PFIZER) 30 MCG/0.3ML IM SUSP
INTRAMUSCULAR | 0 refills | Status: DC
  Filled 2020-09-20: qty 0.3, 30d supply, fill #0

## 2020-09-20 NOTE — Progress Notes (Signed)
   Covid-19 Vaccination Clinic  Name:  Judy Carlson    MRN: [id number] DOB: 14-Apr-1950  09/20/2020  [name] was observed post Covid-19 immunization for 15 minutes without incident. She was provided with Vaccine Information Sheet and instruction to access the V-Safe system.   [name] was instructed to call 911 with any severe reactions post vaccine: Difficulty breathing  Swelling of face and throat  A fast heartbeat  A bad rash all over body  Dizziness and weakness   Immunizations Administered     Name Date Dose VIS Date Route   PFIZER Comrnaty(Gray TOP) Covid-19 Vaccine 09/20/2020 10:23 AM 0.3 mL 02/11/2020 Intramuscular   Manufacturer: [employer], [employer]   Lot: [id number]   NDC: [phone]       Covid-19 Vaccination Clinic  Name:  Judy Carlson    MRN: [id number] DOB: 30-Sep-1950  09/20/2020  [name] was observed post Covid-19 immunization for 15 minutes without incident. She was provided with Vaccine Information Sheet and instruction to access the V-Safe system.   [name] was instructed to call 911 with any severe reactions post vaccine: Difficulty breathing  Swelling of face and throat  A fast heartbeat  A bad rash all over body  Dizziness and weakness   Immunizations Administered     Name Date Dose VIS Date Route   PFIZER Comrnaty(Gray TOP) Covid-19 Vaccine 09/20/2020 10:23 AM 0.3 mL 02/11/2020 Intramuscular   Manufacturer: [employer], [employer]   Lot: [id number]   NDC: [phone]

## 2020-09-21 ENCOUNTER — Telehealth: Payer: Self-pay | Admitting: Internal Medicine

## 2020-09-21 NOTE — Telephone Encounter (Signed)
LVM for pt to rtn my call to schedule AWV with [facility]. Please schedule this appt if pt calls of comes to the office.

## 2020-09-27 LAB — CBC WITH DIFFERENTIAL/PLATELET
Absolute Monocytes: 352 cells/uL (ref 200–950)
Basophils Absolute: 30 cells/uL (ref 0–200)
Basophils Relative: 0.8 %
Eosinophils Absolute: 81 cells/uL (ref 15–500)
Eosinophils Relative: 2.2 %
HCT: 37.6 % (ref 35.0–45.0)
Hemoglobin: 11.8 g/dL (ref 11.7–15.5)
Lymphs Abs: 1569 cells/uL (ref 850–3900)
MCH: 25.3 pg — ABNORMAL LOW (ref 27.0–33.0)
MCHC: 31.4 g/dL — ABNORMAL LOW (ref 32.0–36.0)
MCV: 80.7 fL (ref 80.0–100.0)
MPV: 9.3 fL (ref 7.5–12.5)
Monocytes Relative: 9.5 %
Neutro Abs: 1669 cells/uL (ref 1500–7800)
Neutrophils Relative %: 45.1 %
Platelets: 333 10*3/uL (ref 140–400)
RBC: 4.66 10*6/uL (ref 3.80–5.10)
RDW: 20.8 % — ABNORMAL HIGH (ref 11.0–15.0)
Total Lymphocyte: 42.4 %
WBC: 3.7 10*3/uL — ABNORMAL LOW (ref 3.8–10.8)

## 2020-09-27 LAB — IRON,TIBC AND FERRITIN PANEL
%SAT: 36 % (calc) (ref 16–45)
Ferritin: 19 ng/mL (ref 16–288)
Iron: 125 ug/dL (ref 45–160)
TIBC: 346 mcg/dL (calc) (ref 250–450)

## 2020-09-27 LAB — H. PYLORI BREATH TEST: H. pylori Breath Test: NOT DETECTED

## 2020-09-28 ENCOUNTER — Other Ambulatory Visit: Payer: Self-pay | Admitting: *Deleted

## 2020-09-28 DIAGNOSIS — D508 Other iron deficiency anemias: Secondary | ICD-10-CM

## 2020-09-28 DIAGNOSIS — D500 Iron deficiency anemia secondary to blood loss (chronic): Secondary | ICD-10-CM

## 2020-09-28 NOTE — Progress Notes (Unsigned)
Lab orders have been placed in Epic LMTCB to inform patient labs can be drawn now

## 2020-10-04 ENCOUNTER — Other Ambulatory Visit: Payer: Self-pay

## 2020-10-04 DIAGNOSIS — D500 Iron deficiency anemia secondary to blood loss (chronic): Secondary | ICD-10-CM

## 2020-10-06 ENCOUNTER — Ambulatory Visit (INDEPENDENT_AMBULATORY_CARE_PROVIDER_SITE_OTHER): Payer: Medicare PPO

## 2020-10-06 ENCOUNTER — Other Ambulatory Visit: Payer: Self-pay

## 2020-10-06 VITALS — BP 122/70 | HR 61 | Temp 98.0°F | Ht 65.0 in | Wt 178.6 lb

## 2020-10-06 DIAGNOSIS — Z0000 Encounter for general adult medical examination without abnormal findings: Secondary | ICD-10-CM
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
## 2020-10-06 NOTE — Patient Instructions (Signed)
Judy Carlson , Thank you for taking time to come for your Medicare Wellness Visit. I appreciate your ongoing commitment to your health goals. Please review the following plan we discussed and let me know if I can assist you in the future.   Screening recommendations/referrals: Colonoscopy: last done 07/07/2020; due every 10 years Mammogram: last done 02/03/2020; due every 1-2 years Bone Density: last done 07/24/2017; results: normal Recommended yearly ophthalmology/optometry visit for glaucoma screening and checkup Recommended yearly dental visit for hygiene and checkup  Vaccinations: Influenza vaccine: due Fall 2022 Pneumococcal vaccine: 03/17/2018, 02/15/2017 Tdap vaccine: 03/17/2018; due every 10 years Shingles vaccine: never done   Covid-19: 04/27/2019, 05/18/2019, 02/09/2020, 09/20/2020  Advanced directives: Advance directive discussed with you today. Even though you declined this today please call our office should you change your mind and we can give you the proper paperwork for you to fill out.  Conditions/risks identified: Yes; Client understands the importance of follow-up with providers by attending scheduled visits and discussed goals to eat healthier, increase physical activity, exercise the brain, socialize more, get enough sleep and make time for laughter.  Next appointment: Please schedule your next Medicare Wellness Visit with your Nurse Health Advisor in 1 year by calling [phone].   Preventive Care 70 Years and Older, Female Preventive care refers to lifestyle choices and visits with your health care provider that can promote health and wellness. What does preventive care include? A yearly physical exam. This is also called an annual well check. Dental exams once or twice a year. Routine eye exams. Ask your health care provider how often you should have your eyes checked. Personal lifestyle choices, including: Daily care of your teeth and gums. Regular physical  activity. Eating a healthy diet. Avoiding tobacco and drug use. Limiting alcohol use. Practicing safe sex. Taking low-dose aspirin every day. Taking vitamin and mineral supplements as recommended by your health care provider. What happens during an annual well check? The services and screenings done by your health care provider during your annual well check will depend on your age, overall health, lifestyle risk factors, and family history of disease. Counseling  Your health care provider may ask you questions about your: Alcohol use. Tobacco use. Drug use. Emotional well-being. Home and relationship well-being. Sexual activity. Eating habits. History of falls. Memory and ability to understand (cognition). Work and work Astronomer. Reproductive health. Screening  You may have the following tests or measurements: Height, weight, and BMI. Blood pressure. Lipid and cholesterol levels. These may be checked every 5 years, or more frequently if you are over 70 years old. Skin check. Lung cancer screening. You may have this screening every year starting at age 70 if you have a 30-pack-year history of smoking and currently smoke or have quit within the past 15 years. Fecal occult blood test (FOBT) of the stool. You may have this test every year starting at age 70. Flexible sigmoidoscopy or colonoscopy. You may have a sigmoidoscopy every 5 years or a colonoscopy every 10 years starting at age 70. Hepatitis C blood test. Hepatitis B blood test. Sexually transmitted disease (STD) testing. Diabetes screening. This is done by checking your blood sugar (glucose) after you have not eaten for a while (fasting). You may have this done every 1-3 years. Bone density scan. This is done to screen for osteoporosis. You may have this done starting at age 70. Mammogram. This may be done every 1-2 years. Talk to your health care provider about how often you should have  regular mammograms. Talk with your  health care provider about your test results, treatment options, and if necessary, the need for more tests. Vaccines  Your health care provider may recommend certain vaccines, such as: Influenza vaccine. This is recommended every year. Tetanus, diphtheria, and acellular pertussis (Tdap, Td) vaccine. You may need a Td booster every 10 years. Zoster vaccine. You may need this after age 40. Pneumococcal 13-valent conjugate (PCV13) vaccine. One dose is recommended after age 70. Pneumococcal polysaccharide (PPSV23) vaccine. One dose is recommended after age 20. Talk to your health care provider about which screenings and vaccines you need and how often you need them. This information is not intended to replace advice given to you by your health care provider. Make sure you discuss any questions you have with your health care provider. Document Released: 03/18/2015 Document Revised: 11/09/2015 Document Reviewed: 12/21/2014 Elsevier Interactive Patient Education  2017 [place] Prevention in the Home Falls can cause injuries. They can happen to people of all ages. There are many things you can do to make your home safe and to help prevent falls. What can I do on the outside of my home? Regularly fix the edges of walkways and driveways and fix any cracks. Remove anything that might make you trip as you walk through a door, such as a raised step or threshold. Trim any bushes or trees on the path to your home. Use bright outdoor lighting. Clear any walking paths of anything that might make someone trip, such as rocks or tools. Regularly check to see if handrails are loose or broken. Make sure that both sides of any steps have handrails. Any raised decks and porches should have guardrails on the edges. Have any leaves, snow, or ice cleared regularly. Use sand or salt on walking paths during winter. Clean up any spills in your garage right away. This includes oil or grease spills. What can I  do in the bathroom? Use night lights. Install grab bars by the toilet and in the tub and shower. Do not use towel bars as grab bars. Use non-skid mats or decals in the tub or shower. If you need to sit down in the shower, use a plastic, non-slip stool. Keep the floor dry. Clean up any water that spills on the floor as soon as it happens. Remove soap buildup in the tub or shower regularly. Attach bath mats securely with double-sided non-slip rug tape. Do not have throw rugs and other things on the floor that can make you trip. What can I do in the bedroom? Use night lights. Make sure that you have a light by your bed that is easy to reach. Do not use any sheets or blankets that are too big for your bed. They should not hang down onto the floor. Have a firm chair that has side arms. You can use this for support while you get dressed. Do not have throw rugs and other things on the floor that can make you trip. What can I do in the kitchen? Clean up any spills right away. Avoid walking on wet floors. Keep items that you use a lot in easy-to-reach places. If you need to reach something above you, use a strong step stool that has a grab bar. Keep electrical cords out of the way. Do not use floor polish or wax that makes floors slippery. If you must use wax, use non-skid floor wax. Do not have throw rugs and other things on the floor that  can make you trip. What can I do with my stairs? Do not leave any items on the stairs. Make sure that there are handrails on both sides of the stairs and use them. Fix handrails that are broken or loose. Make sure that handrails are as long as the stairways. Check any carpeting to make sure that it is firmly attached to the stairs. Fix any carpet that is loose or worn. Avoid having throw rugs at the top or bottom of the stairs. If you do have throw rugs, attach them to the floor with carpet tape. Make sure that you have a light switch at the top of the stairs  and the bottom of the stairs. If you do not have them, ask someone to add them for you. What else can I do to help prevent falls? Wear shoes that: Do not have high heels. Have rubber bottoms. Are comfortable and fit you well. Are closed at the toe. Do not wear sandals. If you use a stepladder: Make sure that it is fully opened. Do not climb a closed stepladder. Make sure that both sides of the stepladder are locked into place. Ask someone to hold it for you, if possible. Clearly mark and make sure that you can see: Any grab bars or handrails. First and last steps. Where the edge of each step is. Use tools that help you move around (mobility aids) if they are needed. These include: Canes. Walkers. Scooters. Crutches. Turn on the lights when you go into a dark area. Replace any light bulbs as soon as they burn out. Set up your furniture so you have a clear path. Avoid moving your furniture around. If any of your floors are uneven, fix them. If there are any pets around you, be aware of where they are. Review your medicines with your doctor. Some medicines can make you feel dizzy. This can increase your chance of falling. Ask your doctor what other things that you can do to help prevent falls. This information is not intended to replace advice given to you by your health care provider. Make sure you discuss any questions you have with your health care provider. Document Released: 12/16/2008 Document Revised: 07/28/2015 Document Reviewed: 03/26/2014 Elsevier Interactive Patient Education  2017 [employer].

## 2020-10-17 ENCOUNTER — Ambulatory Visit: Payer: Medicare PPO | Admitting: Internal Medicine

## 2020-10-17 ENCOUNTER — Other Ambulatory Visit: Payer: Self-pay

## 2020-10-17 ENCOUNTER — Encounter: Payer: Self-pay | Admitting: Internal Medicine

## 2020-10-17 DIAGNOSIS — K2970 Gastritis, unspecified, without bleeding: Secondary | ICD-10-CM

## 2020-10-17 DIAGNOSIS — R635 Abnormal weight gain: Secondary | ICD-10-CM | POA: Insufficient documentation

## 2020-10-17 DIAGNOSIS — D508 Other iron deficiency anemias: Secondary | ICD-10-CM | POA: Diagnosis not present

## 2020-10-17 DIAGNOSIS — G4700 Insomnia, unspecified: Secondary | ICD-10-CM | POA: Diagnosis not present

## 2020-10-17 DIAGNOSIS — N1831 Chronic kidney disease, stage 3a: Secondary | ICD-10-CM | POA: Diagnosis not present

## 2020-10-17 DIAGNOSIS — R232 Flushing: Secondary | ICD-10-CM | POA: Diagnosis not present

## 2020-10-17 DIAGNOSIS — K2990 Gastroduodenitis, unspecified, without bleeding: Secondary | ICD-10-CM | POA: Diagnosis not present

## 2020-10-17 MED ORDER — ZOLPIDEM TARTRATE 5 MG PO TABS: 5.0000 mg | ORAL_TABLET | Freq: Every evening | ORAL | 0 refills | Status: DC | PRN

## 2020-10-17 NOTE — Assessment & Plan Note (Addendum)
Worse Low carb diet is discussed

## 2020-10-17 NOTE — Assessment & Plan Note (Addendum)
Cont on Prilosec H. pylori Breath Test NOT DETECTED NOT DETECTED

## 2020-10-17 NOTE — Assessment & Plan Note (Signed)
Monitor GFR 

## 2020-10-17 NOTE — Progress Notes (Signed)
Subjective:  Patient ID: Judy Carlson, female    DOB: [birth year]  Age: 70 y.o. MRN: [id number]  CC: Follow-up (3 month f/u)   HPI Judy Carlson presents for anemia due to GI bleed, stomach ulcers, HTN C/o wt gain  Outpatient Medications Prior to Visit  Medication Sig Dispense Refill   acetaminophen (TYLENOL) 650 MG CR tablet Take 1,300 mg by mouth as needed for pain.      amLODipine (NORVASC) 5 MG tablet Take 1 tablet (5 mg total) by mouth daily. 90 tablet 3   Cholecalciferol (VITAMIN D) 2000 UNITS CAPS Take 2,000 Units by mouth daily.     clopidogrel (PLAVIX) 75 MG tablet TAKE 1 TABLET EVERY DAY 90 tablet 1   COVID-19 mRNA Vac-TriS, Pfizer, SUSP injection Inject into the muscle. 0.3 mL 0   ezetimibe (ZETIA) 10 MG tablet Take 1 tablet (10 mg total) by mouth daily. 90 tablet 3   ferrous sulfate 325 (65 FE) MG tablet Take 1 tablet (325 mg total) by mouth daily with breakfast. 30 tablet 1   furosemide (LASIX) 40 MG tablet Take 0.5 tablets (20 mg total) by mouth daily as needed. 45 tablet 1   metoprolol succinate (TOPROL-XL) 100 MG 24 hr tablet TAKE 1 TABLET DAILY WITH OR IMMEDIATELY FOLLOWING A MEAL. 90 tablet 3   Multiple Vitamin (MULTIVITAMIN) tablet Take 1 tablet by mouth daily.     Olopatadine HCl (PATADAY OP) Place 1 drop into both eyes daily as needed (For dry eyes).     omeprazole (PRILOSEC) 40 MG capsule TAKE 1 CAPSULE EVERY DAY 30 capsule 0   potassium chloride (KLOR-CON) 10 MEQ tablet Take 1 tablet (10 mEq total) by mouth daily. 90 tablet 3   No facility-administered medications prior to visit.    ROS: Review of Systems  Constitutional:  Positive for fatigue and unexpected weight change. Negative for activity change, appetite change and chills.  HENT:  Negative for congestion, mouth sores and sinus pressure.   Eyes:  Negative for visual disturbance.  Respiratory:  Negative for cough and chest tightness.   Gastrointestinal:  Negative for abdominal pain and nausea.   Genitourinary:  Negative for difficulty urinating, frequency and vaginal pain.  Musculoskeletal:  Negative for back pain and gait problem.  Skin:  Negative for pallor and rash.  Neurological:  Negative for dizziness, tremors, weakness, numbness and headaches.  Psychiatric/Behavioral:  Positive for sleep disturbance. Negative for confusion and suicidal ideas.    Objective:  BP 138/76 (BP Location: Left Arm)   Pulse 83   Temp 98.4 F (36.9 C) (Oral)   Ht 5\' 5"  (1.651 m)   Wt 179 lb 6.4 oz (81.4 kg)   SpO2 95%   BMI 29.85 kg/m   BP Readings from Last 3 Encounters:  10/17/20 138/76  10/06/20 122/70  08/11/20 (!) 154/90    Wt Readings from Last 3 Encounters:  10/17/20 179 lb 6.4 oz (81.4 kg)  10/06/20 178 lb 9.6 oz (81 kg)  08/11/20 177 lb 4 oz (80.4 kg)    Physical Exam Constitutional:      General: She is not in acute distress.    Appearance: She is well-developed. She is obese.  HENT:     Head: Normocephalic.     Right Ear: External ear normal.     Left Ear: External ear normal.     Nose: Nose normal.  Eyes:     General:        Right eye: No discharge.  Left eye: No discharge.     Conjunctiva/sclera: Conjunctivae normal.     Pupils: Pupils are equal, round, and reactive to light.  Neck:     Thyroid: No thyromegaly.     Vascular: No JVD.     Trachea: No tracheal deviation.  Cardiovascular:     Rate and Rhythm: Normal rate and regular rhythm.     Heart sounds: Normal heart sounds.  Pulmonary:     Effort: No respiratory distress.     Breath sounds: No stridor. No wheezing.  Abdominal:     General: Bowel sounds are normal. There is no distension.     Palpations: Abdomen is soft. There is no mass.     Tenderness: There is no abdominal tenderness. There is no guarding or rebound.  Musculoskeletal:        General: No tenderness.     Cervical back: Normal range of motion and neck supple. No rigidity.  Lymphadenopathy:     Cervical: No cervical adenopathy.   Skin:    Findings: No erythema or rash.  Neurological:     Mental Status: She is oriented to person, place, and time.     Cranial Nerves: No cranial nerve deficit.     Motor: No abnormal muscle tone.     Coordination: Coordination normal.     Deep Tendon Reflexes: Reflexes normal.  Psychiatric:        Behavior: Behavior normal.        Thought Content: Thought content normal.        Judgment: Judgment normal.    Lab Results  Component Value Date   WBC 3.7 (L) 09/26/2020   HGB 11.8 09/26/2020   HCT 37.6 09/26/2020   PLT 333 09/26/2020   GLUCOSE 117 (H) 07/20/2020   CHOL 242 (H) 12/24/2018   TRIG 101 12/24/2018   HDL 62 12/24/2018   LDLDIRECT 132.9 04/30/2007   LDLCALC 160 (H) 12/24/2018   ALT 19 07/04/2020   AST 19 07/04/2020   NA 140 07/20/2020   K 3.8 07/20/2020   CL 105 07/20/2020   CREATININE 1.32 (H) 07/20/2020   BUN 21 07/20/2020   CO2 24 07/20/2020   TSH 2.41 03/19/2019   INR 1.0 01/07/2019   HGBA1C 5.8 (H) 12/24/2018    No results found.  Assessment & Plan:     Sonda Primes, MD

## 2020-10-17 NOTE — Assessment & Plan Note (Signed)
Occasional Use Zolpidem infrequently  Potential benefits of a long term benzodiazepines  use as well as potential risks  and complications were explained to the patient and were aknowledged.

## 2020-10-17 NOTE — Assessment & Plan Note (Signed)
Off Premarin after 30 years in 2013 Clonidine is an option at hs

## 2020-10-17 NOTE — Patient Instructions (Signed)
  Clonidine is an option at night

## 2020-10-17 NOTE — Assessment & Plan Note (Signed)
On iron PO Check CBC, Iron

## 2020-10-28 ENCOUNTER — Other Ambulatory Visit: Payer: Self-pay | Admitting: Vascular Surgery

## 2020-10-28 DIAGNOSIS — G459 Transient cerebral ischemic attack, unspecified: Secondary | ICD-10-CM

## 2020-10-31 ENCOUNTER — Telehealth: Payer: Self-pay

## 2020-10-31 NOTE — Telephone Encounter (Signed)
Pt called about needing a refill sent to pharmacy for Plavix. Per APP, she can d/c Plavix and take 81 mg Aspirin daily. Pt stated she advised several years ago to take 325 mg of Aspirin by her PCP. She is going to check with her PCP that he is in agreement to go to 81mg  and will call back if she has further questions/concerns.

## 2020-11-03 ENCOUNTER — Other Ambulatory Visit: Payer: Self-pay | Admitting: Gastroenterology

## 2020-11-07 NOTE — Telephone Encounter (Signed)
Yes, please change it to 90 tablets  - HD

## 2020-11-08 NOTE — Telephone Encounter (Signed)
Thank you for the note.  Yes, please change it to pantoprazole 40 mg once daily Disp#90, RF 1

## 2020-11-29 ENCOUNTER — Other Ambulatory Visit (INDEPENDENT_AMBULATORY_CARE_PROVIDER_SITE_OTHER): Payer: Medicare PPO

## 2020-11-29 DIAGNOSIS — D500 Iron deficiency anemia secondary to blood loss (chronic): Secondary | ICD-10-CM

## 2020-11-29 LAB — CBC WITH DIFFERENTIAL/PLATELET
Basophils Absolute: 0 10*3/uL (ref 0.0–0.1)
Basophils Relative: 1.2 % (ref 0.0–3.0)
Eosinophils Absolute: 0.1 10*3/uL (ref 0.0–0.7)
Eosinophils Relative: 1.8 % (ref 0.0–5.0)
HCT: 38.1 % (ref 36.0–46.0)
Hemoglobin: 12.3 g/dL (ref 12.0–15.0)
Lymphocytes Relative: 33.5 % (ref 12.0–46.0)
Lymphs Abs: 1.3 10*3/uL (ref 0.7–4.0)
MCHC: 32.2 g/dL (ref 30.0–36.0)
MCV: 84.5 fl (ref 78.0–100.0)
Monocytes Absolute: 0.4 10*3/uL (ref 0.1–1.0)
Monocytes Relative: 11.7 % (ref 3.0–12.0)
Neutro Abs: 1.9 10*3/uL (ref 1.4–7.7)
Neutrophils Relative %: 51.8 % (ref 43.0–77.0)
Platelets: 322 10*3/uL (ref 150.0–400.0)
RBC: 4.51 Mil/uL (ref 3.87–5.11)
RDW: 17.1 % — ABNORMAL HIGH (ref 11.5–15.5)
WBC: 3.7 10*3/uL — ABNORMAL LOW (ref 4.0–10.5)

## 2020-11-29 NOTE — Telephone Encounter (Signed)
Pt came in for labs today.   

## 2020-11-29 NOTE — Telephone Encounter (Signed)
Please send her for a CBC and iron studies  - HD

## 2020-11-30 LAB — IRON,TIBC AND FERRITIN PANEL
%SAT: 19 % (calc) (ref 16–45)
Ferritin: 10 ng/mL — ABNORMAL LOW (ref 16–288)
Iron: 67 ug/dL (ref 45–160)
TIBC: 349 mcg/dL (calc) (ref 250–450)

## 2020-12-22 ENCOUNTER — Other Ambulatory Visit: Payer: Self-pay

## 2020-12-22 ENCOUNTER — Ambulatory Visit: Payer: Medicare PPO | Attending: Internal Medicine

## 2020-12-22 DIAGNOSIS — Z23 Encounter for immunization: Secondary | ICD-10-CM

## 2020-12-22 MED ORDER — PFIZER COVID-19 VAC BIVALENT 30 MCG/0.3ML IM SUSP
INTRAMUSCULAR | 0 refills | Status: DC
  Filled 2020-12-22: qty 0.3, 1d supply, fill #0

## 2020-12-22 NOTE — Progress Notes (Signed)
   Covid-19 Vaccination Clinic  Name:  Judy Carlson    MRN: [id number] DOB: Jul 16, 1950  12/22/2020  [name] was observed post Covid-19 immunization for 15 minutes without incident. She was provided with Vaccine Information Sheet and instruction to access the V-Safe system.   [name] was instructed to call 911 with any severe reactions post vaccine: Difficulty breathing  Swelling of face and throat  A fast heartbeat  A bad rash all over body  Dizziness and weakness   Immunizations Administered     Name Date Dose VIS Date Route   Pfizer Covid-19 Vaccine Bivalent Booster 12/22/2020 10:37 AM 0.3 mL 11/02/2020 Intramuscular   Manufacturer: [employer], [employer]   Lot: [id number]   NDC: [phone]       Covid-19 Vaccination Clinic  Name:  Judy Carlson    MRN: [id number] DOB: 1950-12-02  12/22/2020  [name] was observed post Covid-19 immunization for 15 minutes without incident. She was provided with Vaccine Information Sheet and instruction to access the V-Safe system.   [name] was instructed to call 911 with any severe reactions post vaccine: Difficulty breathing  Swelling of face and throat  A fast heartbeat  A bad rash all over body  Dizziness and weakness   Immunizations Administered     Name Date Dose VIS Date Route   Pfizer Covid-19 Vaccine Bivalent Booster 12/22/2020 10:37 AM 0.3 mL 11/02/2020 Intramuscular   Manufacturer: [employer], [employer]   Lot: [id number]   NDC: [phone]

## 2020-12-28 ENCOUNTER — Ambulatory Visit (INDEPENDENT_AMBULATORY_CARE_PROVIDER_SITE_OTHER): Payer: Medicare PPO

## 2020-12-28 ENCOUNTER — Other Ambulatory Visit: Payer: Self-pay

## 2020-12-28 DIAGNOSIS — Z23 Encounter for immunization: Secondary | ICD-10-CM

## 2020-12-28 NOTE — Progress Notes (Addendum)
Pt was given HD flu vacc w/o complications. Medical screening examination/treatment/procedure(s) were performed by non-physician practitioner and as supervising physician I was immediately available for consultation/collaboration.  I agree with above. Jacinta Shoe, MD

## 2020-12-29 ENCOUNTER — Other Ambulatory Visit: Payer: Self-pay | Admitting: Internal Medicine

## 2020-12-29 DIAGNOSIS — Z1231 Encounter for screening mammogram for malignant neoplasm of breast: Secondary | ICD-10-CM

## 2021-01-18 ENCOUNTER — Other Ambulatory Visit: Payer: Self-pay

## 2021-01-18 DIAGNOSIS — Z9889 Other specified postprocedural states: Secondary | ICD-10-CM

## 2021-01-27 ENCOUNTER — Other Ambulatory Visit: Payer: Self-pay | Admitting: Vascular Surgery

## 2021-02-03 ENCOUNTER — Ambulatory Visit
Admission: RE | Admit: 2021-02-03 | Discharge: 2021-02-03 | Disposition: A | Discharge status: Home / Self Care | Payer: Medicare PPO | Source: Ambulatory Visit | Attending: Internal Medicine | Admitting: Internal Medicine

## 2021-02-03 ENCOUNTER — Other Ambulatory Visit: Payer: Self-pay

## 2021-02-03 DIAGNOSIS — Z1231 Encounter for screening mammogram for malignant neoplasm of breast: Secondary | ICD-10-CM | POA: Insufficient documentation

## 2021-02-13 NOTE — Progress Notes (Signed)
Patient name: Judy Carlson MRN: [id number] DOB: 09/20/1950 Sex: female  REASON FOR VISIT: Follow-up right carotid endarterectomy on 01/12/2019  HPI: Judy Carlson is a 70 y.o. female here today for follow-up.  She underwent uneventful endarterectomy for symptomatic disease.  She was discharged home on postoperative day 1.  She reports no new neurologic deficits.  She does report the usual periincisional numbness and also some numbness of her right earlobe. This has improved. No new neurologic symptoms.  02/14/21: returns for carotid surveillance. No new neurologic symptoms. Doing well.   Current Outpatient Medications  Medication Sig Dispense Refill   acetaminophen (TYLENOL) 650 MG CR tablet Take 1,300 mg by mouth as needed for pain.      amLODipine (NORVASC) 5 MG tablet Take 1 tablet (5 mg total) by mouth daily. 90 tablet 3   Cholecalciferol (VITAMIN D) 2000 UNITS CAPS Take 2,000 Units by mouth daily.     clopidogrel (PLAVIX) 75 MG tablet TAKE 1 TABLET EVERY DAY 90 tablet 1   COVID-19 mRNA bivalent vaccine, Pfizer, (PFIZER COVID-19 VAC BIVALENT) injection Inject into the muscle. 0.3 mL 0   ezetimibe (ZETIA) 10 MG tablet TAKE 1 TABLET EVERY DAY 90 tablet 3   ferrous sulfate 325 (65 FE) MG tablet Take 1 tablet (325 mg total) by mouth daily with breakfast. 30 tablet 1   furosemide (LASIX) 40 MG tablet Take 0.5 tablets (20 mg total) by mouth daily as needed. 45 tablet 1   metoprolol succinate (TOPROL-XL) 100 MG 24 hr tablet TAKE 1 TABLET DAILY WITH OR IMMEDIATELY FOLLOWING A MEAL. 90 tablet 3   Multiple Vitamin (MULTIVITAMIN) tablet Take 1 tablet by mouth daily.     Olopatadine HCl (PATADAY OP) Place 1 drop into both eyes daily as needed (For dry eyes).     omeprazole (PRILOSEC) 40 MG capsule Take 1 capsule (40 mg total) by mouth daily. 90 capsule 1   potassium chloride (KLOR-CON) 10 MEQ tablet Take 1 tablet (10 mEq total) by mouth daily. 90 tablet 3    zolpidem (AMBIEN) 5 MG tablet Take 1 tablet (5 mg total) by mouth at bedtime as needed for sleep. 90 tablet 0   No current facility-administered medications for this visit.     PHYSICAL EXAM: Vitals:   02/14/21 1104  BP: 134/78  Pulse: 64  Resp: 20  Temp: 98.2 F (36.8 C)  SpO2: 99%  Weight: 183 lb (83 kg)  Height: 5\' 5"  (1.651 m)    GENERAL: The patient is a well-nourished female, in no acute distress. The vital signs are documented above. Well-healed right neck incision.   Neurologically intact  Duplex ultrasound 02/14/21: Unchanged appearance of duplex ultrasound, about 40% residual stenosis on right by velocity criteria.  MEDICAL ISSUES: Judy Carlson is a 70 y.o. female status post right carotid endarterectomy. Mild residual stenosis which is likely due to tortuousity. This appears stable overall.   The patient should continue best medical therapy for carotid artery stenosis including: Complete cessation from all tobacco products. Blood glucose control with goal A1c < 7%. Blood pressure control with goal blood pressure < 140/90 mmHg. Lipid reduction therapy with goal LDL-C <100 mg/dL (66 if symptomatic from carotid artery stenosis).  Aspirin 81mg  PO QD OR Clopidogrel 75mg  PO QD. Atorvastatin 40-80mg  PO QD (or other "high intensity" statin therapy).  Follow up in 2 years with repeat carotid duplex  <54. , MD [facility] Phone Number: [phone] 02/13/2021 6:33  PM   

## 2021-02-14 ENCOUNTER — Encounter: Payer: Self-pay | Admitting: Vascular Surgery

## 2021-02-14 ENCOUNTER — Ambulatory Visit: Payer: Medicare PPO | Admitting: Vascular Surgery

## 2021-02-14 ENCOUNTER — Other Ambulatory Visit: Payer: Self-pay

## 2021-02-14 ENCOUNTER — Ambulatory Visit (HOSPITAL_COMMUNITY)
Admission: RE | Admit: 2021-02-14 | Discharge: 2021-02-14 | Disposition: A | Discharge status: Home / Self Care | Payer: Medicare PPO | Source: Ambulatory Visit | Attending: Vascular Surgery | Admitting: Vascular Surgery

## 2021-02-14 VITALS — BP 134/78 | HR 64 | Temp 98.2°F | Resp 20 | Ht 65.0 in | Wt 183.0 lb

## 2021-02-14 DIAGNOSIS — Z9889 Other specified postprocedural states: Secondary | ICD-10-CM | POA: Insufficient documentation

## 2021-02-14 DIAGNOSIS — I6523 Occlusion and stenosis of bilateral carotid arteries: Secondary | ICD-10-CM | POA: Diagnosis not present

## 2021-02-15 IMAGING — CT CT ANGIO HEAD
1 of 7 series · 17 of 47 positions shown · IV contrast (OMNI)
Comparison: Prior CT from 12/23/2018.

CLINICAL DATA: Initial evaluation for right branch retinal artery
occlusion.

EXAM:
CT ANGIOGRAPHY HEAD AND NECK
TECHNIQUE: Multidetector CT imaging of the head and neck was performed using
the standard protocol during bolus administration of intravenous
contrast. Multiplanar CT image reconstructions and MIPs were
obtained to evaluate the vascular anatomy. Carotid stenosis
measurements (when applicable) are obtained utilizing NASCET
criteria, using the distal internal carotid diameter as the
denominator.
CONTRAST:  75mL OMNIPAQUE IOHEXOL 350 MG/ML SOLN

[Series 6: thin · axial · 0.43mm/px · z∈[+988,+1279]mm · 17 of 644 slices shown]
[im 31/644  brain]
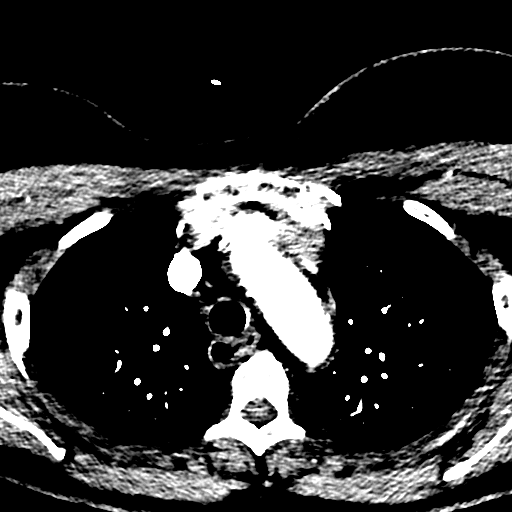
[im 62/644  bone]
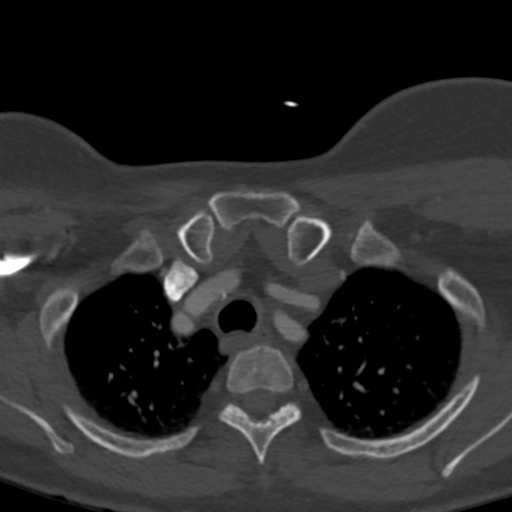
[im 92/644  brain]
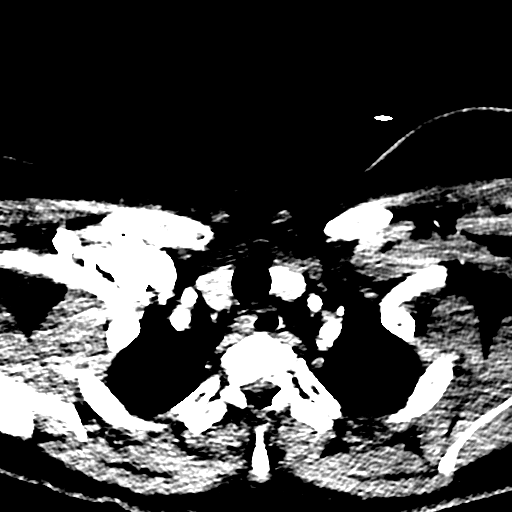
[im 154/644  bone]
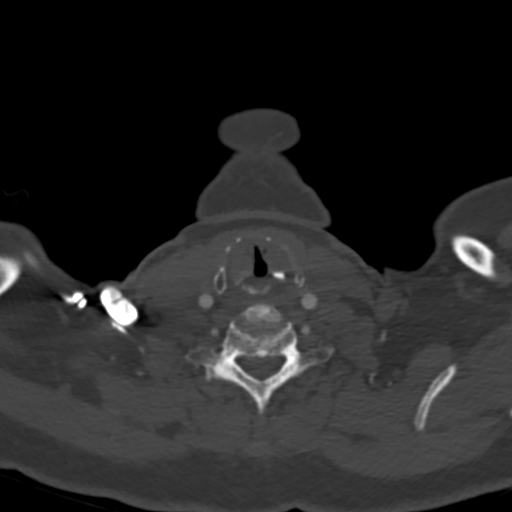
[im 184/644  brain]
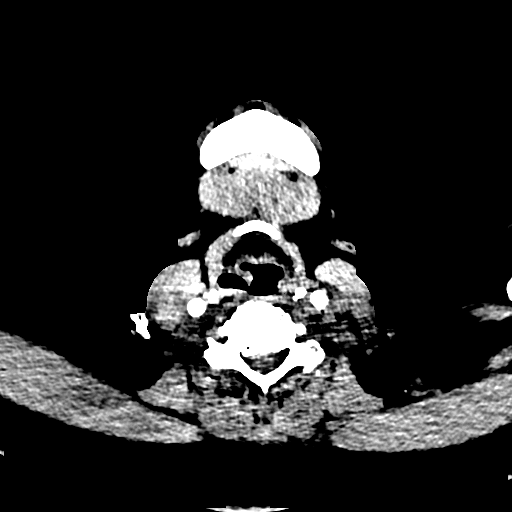
[im 215/644  bone]
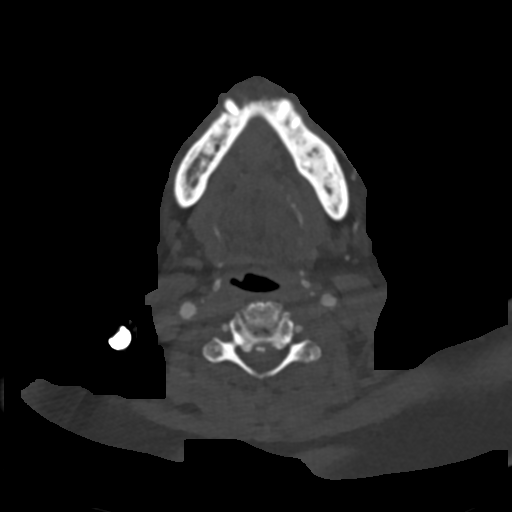
[im 245/644  brain]
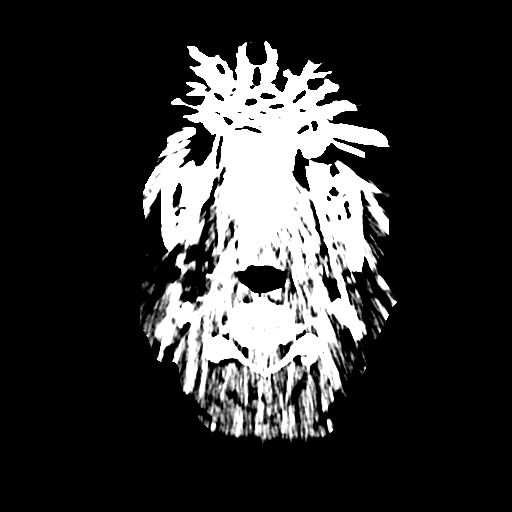
[im 276/644  bone]
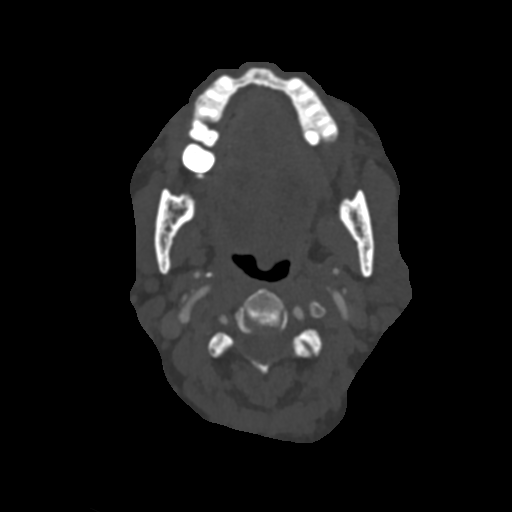
[im 337/644  brain]
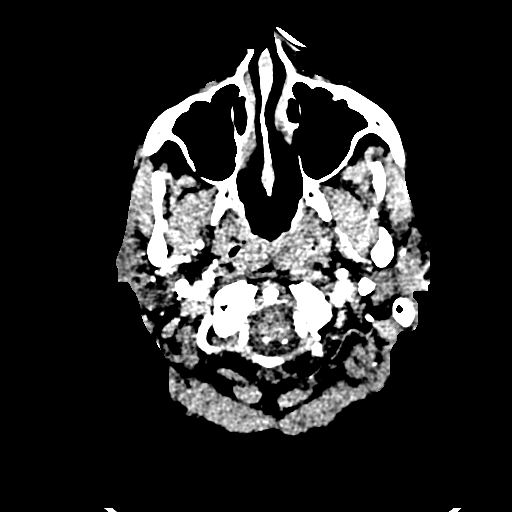
[im 368/644  bone]
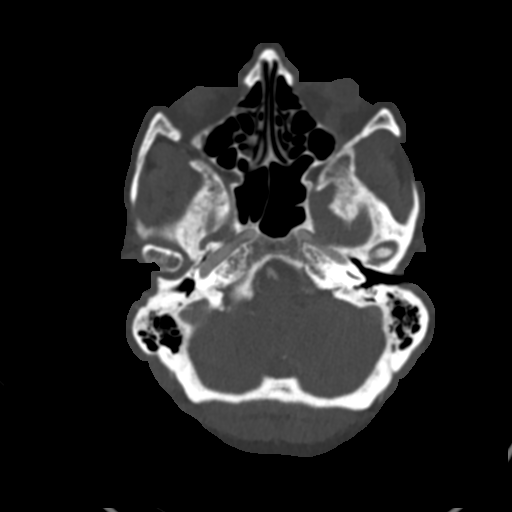
[im 399/644  brain]
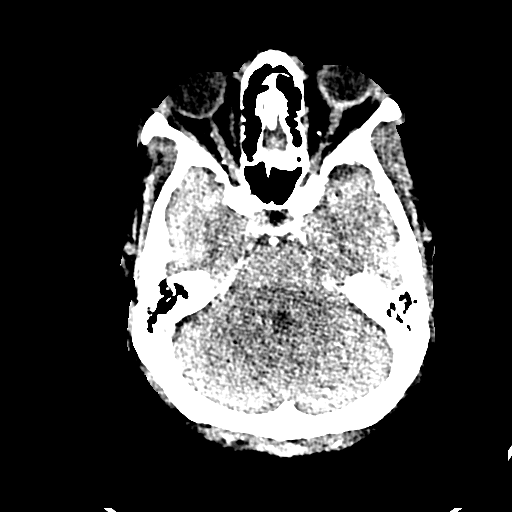
[im 429/644  bone]
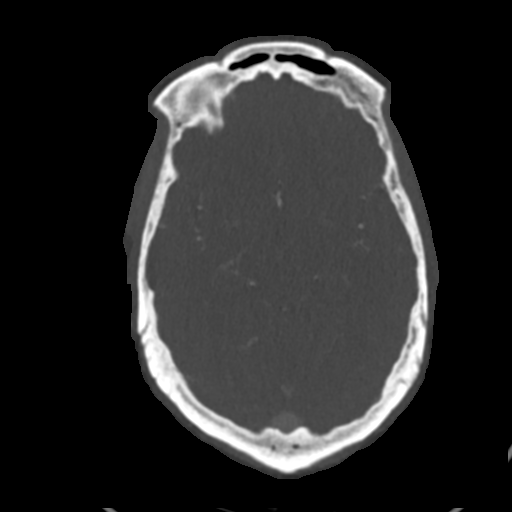
[im 460/644  brain]
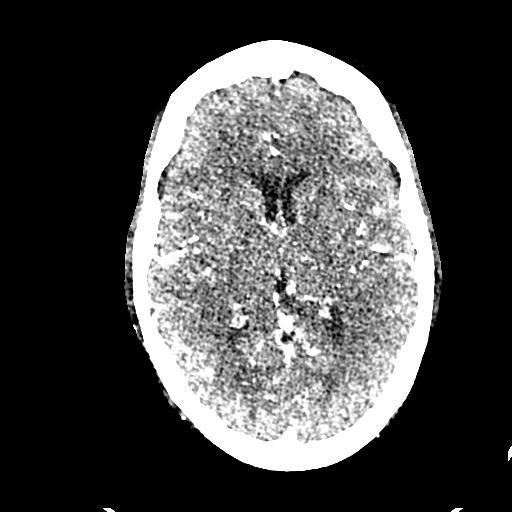
[im 490/644  bone]
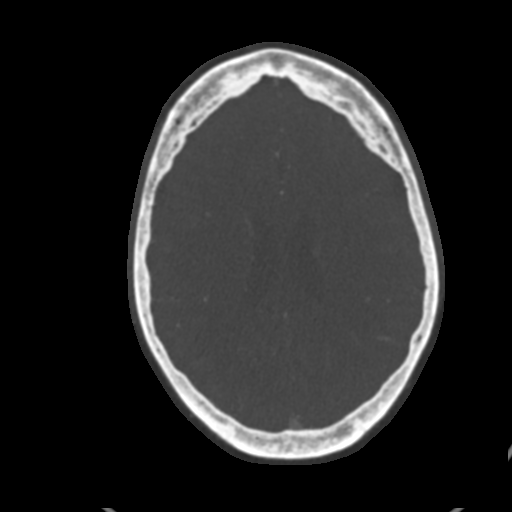
[im 552/644  brain]
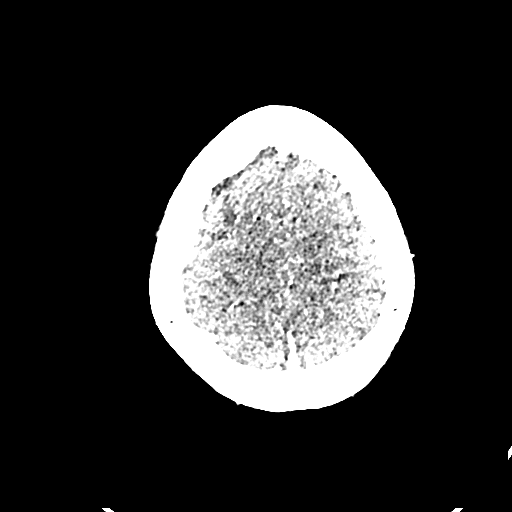
[im 582/644  bone]
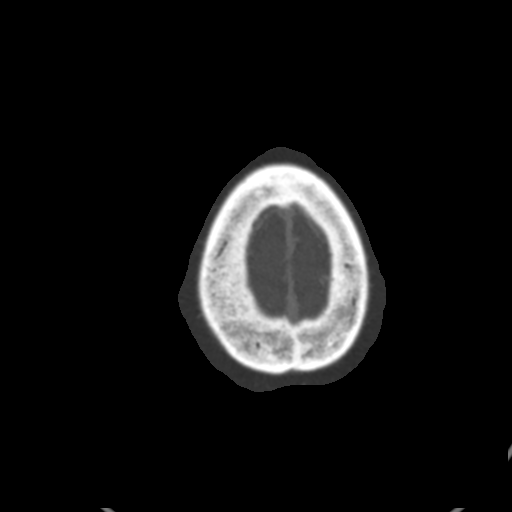
[im 613/644  brain]
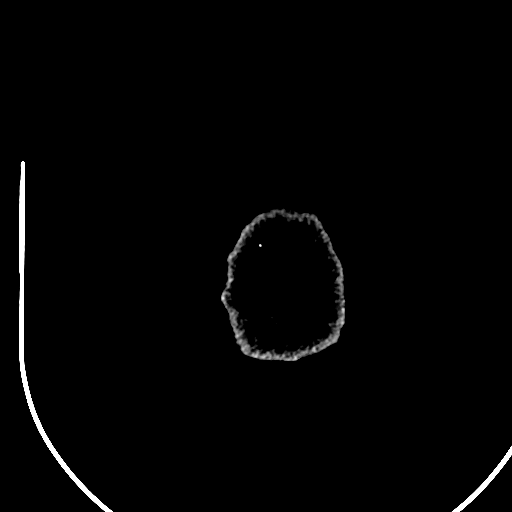

[17 of 47 positions shown; findings below may reference images not displayed]

FINDINGS: CTA NECK FINDINGS

Aortic arch: Visualized aortic arch of normal caliber with normal 3
vessel morphology. Moderate atherosclerotic change about the arch
and origin of the great vessels. Associated stenosis of up to
approximately 40% at the origin of the left subclavian artery. No
other hemodynamically significant stenosis about the origin the
great vessels. Visualized subclavian arteries otherwise patent.

Right carotid system: Right CCA patent from its origin to the
bifurcation without stenosis. Bulky calcified plaque about the right
bifurcation/proximal right ICA with associated short-segment
stenosis of up to 80% by NASCET criteria. Right ICA widely patent
distally to the skull base without stenosis, dissection, or
occlusion.

Left carotid system: Left CCA patent from its origin to the
bifurcation without stenosis. Scattered calcified plaque about the
left bifurcation with associated stenosis of up to 40% by NASCET
criteria. Left ICA otherwise patent to the skull base without
stenosis, dissection, or occlusion.

Vertebral arteries: Both vertebral arteries arise from the
subclavian arteries. Left vertebral artery dominant. Atheromatous
plaque about the origin of the vertebral arteries bilaterally
without significant stenosis on the left. There is severe at least
80% stenosis at the origin of the right vertebral artery. Vertebral
arteries otherwise widely patent without stenosis, dissection, or
occlusion.

Skeleton: No acute osseous finding. No discrete lytic or blastic
osseous lesions. Moderate cervical spondylosis at C3-4 through C6-7.

Other neck: No other acute soft tissue abnormality within the neck.
No mass lesion or adenopathy.

Upper chest: Visualized upper chest demonstrates no acute finding.

Review of the MIP images confirms the above findings

CTA HEAD FINDINGS

Anterior circulation: Petrous segments widely patent bilaterally.
Multifocal atheromatous plaque throughout the carotid siphons with
associated moderate multifocal narrowing. ICA termini well perfused.
A1 segments patent bilaterally. Normal anterior communicating artery
complex. Anterior cerebral arteries widely patent to their distal
aspects without stenosis. No M1 stenosis or occlusion. Negative MCA
bifurcations. Distal MCA branches well perfused and symmetric.
Distal small vessel atheromatous irregularity noted.

Posterior circulation: Dominant left vertebral artery patent to the
vertebrobasilar junction without hemodynamically significant
stenosis. Patent left PICA. Short-segment severe proximal right V4
stenosis noted (series 6, image 343). Right V4 segment otherwise
patent. Right PICA not visualized. Basilar patent to its distal
aspect without stenosis. Superior cerebral arteries patent
proximally. Both posterior cerebral arteries primarily supplied via
the basilar and are well perfused to their distal aspects.

Venous sinuses: Grossly patent allowing for timing the contrast
bolus.

Anatomic variants: None significant.

Review of the MIP images confirms the above findings
IMPRESSION: 1. Negative CTA for large vessel occlusion.
2. Severe short-segment 80% atheromatous stenosis about the right
bifurcation/proximal right ICA.
3. 40% atheromatous stenosis about the left bifurcation/proximal
left ICA.
4. Short-segment severe stenoses at the origin of the right
vertebral as well as at the right V4 segment. Left vertebral artery
dominant without hemodynamically significant stenosis.
5. Moderate atherosclerotic change about the aortic arch and carotid
siphons without high-grade stenosis.

## 2021-02-16 ENCOUNTER — Ambulatory Visit: Payer: Medicare PPO | Admitting: Internal Medicine

## 2021-02-16 ENCOUNTER — Other Ambulatory Visit: Payer: Self-pay

## 2021-02-16 ENCOUNTER — Encounter: Payer: Self-pay | Admitting: Internal Medicine

## 2021-02-16 VITALS — BP 142/86 | HR 72 | Temp 97.9°F | Ht 65.0 in | Wt 183.2 lb

## 2021-02-16 DIAGNOSIS — G4700 Insomnia, unspecified: Secondary | ICD-10-CM | POA: Diagnosis not present

## 2021-02-16 DIAGNOSIS — N1831 Chronic kidney disease, stage 3a: Secondary | ICD-10-CM

## 2021-02-16 DIAGNOSIS — E559 Vitamin D deficiency, unspecified: Secondary | ICD-10-CM | POA: Diagnosis not present

## 2021-02-16 DIAGNOSIS — K2990 Gastroduodenitis, unspecified, without bleeding: Secondary | ICD-10-CM

## 2021-02-16 DIAGNOSIS — K2970 Gastritis, unspecified, without bleeding: Secondary | ICD-10-CM | POA: Diagnosis not present

## 2021-02-16 DIAGNOSIS — I6521 Occlusion and stenosis of right carotid artery: Secondary | ICD-10-CM | POA: Diagnosis not present

## 2021-02-16 DIAGNOSIS — D508 Other iron deficiency anemias: Secondary | ICD-10-CM | POA: Diagnosis not present

## 2021-02-16 LAB — COMPREHENSIVE METABOLIC PANEL
ALT: 23 U/L (ref 0–35)
AST: 20 U/L (ref 0–37)
Albumin: 4.3 g/dL (ref 3.5–5.2)
Alkaline Phosphatase: 70 U/L (ref 39–117)
BUN: 17 mg/dL (ref 6–23)
CO2: 28 mEq/L (ref 19–32)
Calcium: 10.3 mg/dL (ref 8.4–10.5)
Chloride: 108 mEq/L (ref 96–112)
Creatinine, Ser: 1.15 mg/dL (ref 0.40–1.20)
GFR: 48.22 mL/min — ABNORMAL LOW (ref >60.00)
Glucose, Bld: 104 mg/dL — ABNORMAL HIGH (ref 70–99)
Potassium: 3.7 mEq/L (ref 3.5–5.1)
Sodium: 144 mEq/L (ref 135–145)
Total Bilirubin: 0.3 mg/dL (ref 0.2–1.2)
Total Protein: 7.6 g/dL (ref 6.0–8.3)

## 2021-02-16 LAB — CBC WITH DIFFERENTIAL/PLATELET
Basophils Absolute: 0 10*3/uL (ref 0.0–0.1)
Basophils Relative: 0.9 % (ref 0.0–3.0)
Eosinophils Absolute: 0.1 10*3/uL (ref 0.0–0.7)
Eosinophils Relative: 2.3 % (ref 0.0–5.0)
HCT: 39.3 % (ref 36.0–46.0)
Hemoglobin: 12.6 g/dL (ref 12.0–15.0)
Lymphocytes Relative: 28.8 % (ref 12.0–46.0)
Lymphs Abs: 1.3 10*3/uL (ref 0.7–4.0)
MCHC: 32.1 g/dL (ref 30.0–36.0)
MCV: 85.2 fl (ref 78.0–100.0)
Monocytes Absolute: 0.6 10*3/uL (ref 0.1–1.0)
Monocytes Relative: 12.9 % — ABNORMAL HIGH (ref 3.0–12.0)
Neutro Abs: 2.5 10*3/uL (ref 1.4–7.7)
Neutrophils Relative %: 55.1 % (ref 43.0–77.0)
Platelets: 323 10*3/uL (ref 150.0–400.0)
RBC: 4.61 Mil/uL (ref 3.87–5.11)
RDW: 16.3 % — ABNORMAL HIGH (ref 11.5–15.5)
WBC: 4.5 10*3/uL (ref 4.0–10.5)

## 2021-02-16 MED ORDER — ASPIRIN EC 81 MG PO TBEC: 81.0000 mg | DELAYED_RELEASE_TABLET | Freq: Every day | ORAL | 3 refills | Status: DC

## 2021-02-16 NOTE — Assessment & Plan Note (Signed)
On Vit D 

## 2021-02-16 NOTE — Assessment & Plan Note (Signed)
Check CBC 

## 2021-02-16 NOTE — Assessment & Plan Note (Signed)
On Prilosec daily

## 2021-02-16 NOTE — Assessment & Plan Note (Signed)
Monitor GFR 

## 2021-02-16 NOTE — Progress Notes (Signed)
Subjective:  Patient ID: Judy Carlson, female    DOB: February 28, 1951  Age: 70 y.o. MRN: [id number]  CC: Follow-up (4 month f/u- Pt states she saw Dr. Juanetta Gosling on Tues need to discuss Plavix & Zetia)   HPI Judy Carlson presents for HTN, gastritis, carotid disease f/u  Outpatient Medications Prior to Visit  Medication Sig Dispense Refill   acetaminophen (TYLENOL) 650 MG CR tablet Take 1,300 mg by mouth as needed for pain.      amLODipine (NORVASC) 5 MG tablet Take 1 tablet (5 mg total) by mouth daily. 90 tablet 3   Cholecalciferol (VITAMIN D) 2000 UNITS CAPS Take 2,000 Units by mouth daily.     ezetimibe (ZETIA) 10 MG tablet TAKE 1 TABLET EVERY DAY 90 tablet 3   furosemide (LASIX) 40 MG tablet Take 0.5 tablets (20 mg total) by mouth daily as needed. 45 tablet 1   metoprolol succinate (TOPROL-XL) 100 MG 24 hr tablet TAKE 1 TABLET DAILY WITH OR IMMEDIATELY FOLLOWING A MEAL. 90 tablet 3   Multiple Vitamin (MULTIVITAMIN) tablet Take 1 tablet by mouth daily.     Olopatadine HCl (PATADAY OP) Place 1 drop into both eyes daily as needed (For dry eyes).     omeprazole (PRILOSEC) 40 MG capsule Take 1 capsule (40 mg total) by mouth daily. 90 capsule 1   potassium chloride (KLOR-CON) 10 MEQ tablet Take 1 tablet (10 mEq total) by mouth daily. 90 tablet 3   clopidogrel (PLAVIX) 75 MG tablet TAKE 1 TABLET EVERY DAY 90 tablet 1   zolpidem (AMBIEN) 5 MG tablet Take 1 tablet (5 mg total) by mouth at bedtime as needed for sleep. 90 tablet 0   COVID-19 mRNA bivalent vaccine, Pfizer, (PFIZER COVID-19 VAC BIVALENT) injection Inject into the muscle. (Patient not taking: Reported on 02/16/2021) 0.3 mL 0   No facility-administered medications prior to visit.    ROS: Review of Systems  Constitutional:  Negative for activity change, appetite change, chills, fatigue and unexpected weight change.  HENT:  Negative for congestion, mouth sores and sinus pressure.   Eyes:  Negative for visual disturbance.   Respiratory:  Negative for cough and chest tightness.   Gastrointestinal:  Negative for abdominal pain, blood in stool and nausea.  Genitourinary:  Negative for difficulty urinating, frequency and vaginal pain.  Musculoskeletal:  Negative for back pain and gait problem.  Skin:  Negative for pallor and rash.  Neurological:  Negative for dizziness, tremors, weakness, numbness and headaches.  Psychiatric/Behavioral:  Positive for sleep disturbance. Negative for confusion.    Objective:  BP (!) 142/86 (BP Location: Left Arm)    Pulse 72    Temp 97.9 F (36.6 C) (Oral)    Ht 5\' 5"  (1.651 m)    Wt 183 lb 3.2 oz (83.1 kg)    SpO2 97%    BMI 30.49 kg/m   BP Readings from Last 3 Encounters:  02/16/21 (!) 142/86  02/14/21 134/78  10/17/20 138/76    Wt Readings from Last 3 Encounters:  02/16/21 183 lb 3.2 oz (83.1 kg)  02/14/21 183 lb (83 kg)  10/17/20 179 lb 6.4 oz (81.4 kg)    Physical Exam Constitutional:      General: She is not in acute distress.    Appearance: She is well-developed.  HENT:     Head: Normocephalic.     Right Ear: External ear normal.     Left Ear: External ear normal.     Nose: Nose normal.  Eyes:     General:        Right eye: No discharge.        Left eye: No discharge.     Conjunctiva/sclera: Conjunctivae normal.     Pupils: Pupils are equal, round, and reactive to light.  Neck:     Thyroid: No thyromegaly.     Vascular: No JVD.     Trachea: No tracheal deviation.  Cardiovascular:     Rate and Rhythm: Normal rate and regular rhythm.     Heart sounds: Normal heart sounds.  Pulmonary:     Effort: No respiratory distress.     Breath sounds: No stridor. No wheezing.  Abdominal:     General: Bowel sounds are normal. There is no distension.     Palpations: Abdomen is soft. There is no mass.     Tenderness: There is no abdominal tenderness. There is no guarding or rebound.  Musculoskeletal:        General: No tenderness.     Cervical back: Normal range  of motion and neck supple. No rigidity.  Lymphadenopathy:     Cervical: No cervical adenopathy.  Skin:    Findings: No erythema or rash.  Neurological:     Cranial Nerves: No cranial nerve deficit.     Motor: No abnormal muscle tone.     Coordination: Coordination normal.     Deep Tendon Reflexes: Reflexes normal.  Psychiatric:        Behavior: Behavior normal.        Thought Content: Thought content normal.        Judgment: Judgment normal.    Lab Results  Component Value Date   WBC 3.7 (L) 11/29/2020   HGB 12.3 11/29/2020   HCT 38.1 11/29/2020   PLT 322.0 11/29/2020   GLUCOSE 117 (H) 07/20/2020   CHOL 242 (H) 12/24/2018   TRIG 101 12/24/2018   HDL 62 12/24/2018   LDLDIRECT 132.9 04/30/2007   LDLCALC 160 (H) 12/24/2018   ALT 19 07/04/2020   AST 19 07/04/2020   NA 140 07/20/2020   K 3.8 07/20/2020   CL 105 07/20/2020   CREATININE 1.32 (H) 07/20/2020   BUN 21 07/20/2020   CO2 24 07/20/2020   TSH 2.41 03/19/2019   INR 1.0 01/07/2019   HGBA1C 5.8 (H) 12/24/2018    VAS US CAROTID  Result Date: 02/14/2021 Carotid Arterial Duplex Study Patient Name:  Judy Carlson  Date of Exam:   02/14/2021 Medical Rec #: [id number]        Accession #:    [id number] Date of Birth: 1950/08/12        Patient Gender: F Patient Age:   15 years Exam Location:  [facility] Procedure:      VAS US CAROTID Referring Phys: Jamelle Haring --------------------------------------------------------------------------------  Indications:       TIA and right endarterectomy. Risk Factors:      Hypertension, past history of smoking. Comparison Study:  01/12/20, Rt endarterectomy 01/12/19 Performing Technologist: Antonieta Pert RDMS, RVT  Examination Guidelines: A complete evaluation includes B-mode imaging, spectral Doppler, color Doppler, and power Doppler as needed of all accessible portions of each vessel. Bilateral testing is considered an integral part of a complete examination. Limited  examinations for reoccurring indications may be performed as noted.  Right Carotid Findings: +----------+--------+--------+--------+-------------------------------+--------+             PSV cm/s EDV cm/s Stenosis Plaque Description  Comments  +----------+--------+--------+--------+-------------------------------+--------+  CCA Prox   75       20                                                          +----------+--------+--------+--------+-------------------------------+--------+  CCA Mid    60       21                                                          +----------+--------+--------+--------+-------------------------------+--------+  CCA Distal 58       19                hyperechoic, smooth and                                                          calcific                                  +----------+--------+--------+--------+-------------------------------+--------+  ICA Prox   117      45       40-59%                                             +----------+--------+--------+--------+-------------------------------+--------+  ICA Mid    118      44                                                          +----------+--------+--------+--------+-------------------------------+--------+  ICA Distal 87       38                                                          +----------+--------+--------+--------+-------------------------------+--------+  ECA        76       17                                                          +----------+--------+--------+--------+-------------------------------+--------+ +----------+--------+-------+----------------+-------------------+             PSV cm/s EDV cms Describe         Arm Pressure (mmHG)  +----------+--------+-------+----------------+-------------------+  Subclavian 121              Multiphasic, WNL                      +----------+--------+-------+----------------+-------------------+ +---------+--------+--+--------+--+---------+  Vertebral PSV  cm/s 52 EDV cm/s 14 Antegrade  +---------+--------+--+--------+--+---------+ site of endarterectomy, distal CCA/prox ICA, soft thrombus noted with no significant velicity changes. Left Carotid Findings: +----------+--------+--------+--------+------------------+---------------------+             PSV cm/s EDV cm/s Stenosis Plaque Description Comments               +----------+--------+--------+--------+------------------+---------------------+  CCA Prox   91       24                                                          +----------+--------+--------+--------+------------------+---------------------+  CCA Mid    67       20                focal and                                                                        hyperechoic                               +----------+--------+--------+--------+------------------+---------------------+  CCA Distal 75       22                calcific and                                                                     diffuse                                   +----------+--------+--------+--------+------------------+---------------------+  ICA Prox   68       27       1-39%    calcific           focal plaque lateral                                                             wall.                  +----------+--------+--------+--------+------------------+---------------------+  ICA Mid    106      40                                   boarderline 40-59%     +----------+--------+--------+--------+------------------+---------------------+  ICA Distal 80       24                                                          +----------+--------+--------+--------+------------------+---------------------+  ECA        90       14                                                          +----------+--------+--------+--------+------------------+---------------------+ +----------+--------+--------+----------------+-------------------+             PSV cm/s EDV cm/s Describe         Arm  Pressure (mmHG)  +----------+--------+--------+----------------+-------------------+  Subclavian 215               Multiphasic, WNL                      +----------+--------+--------+----------------+-------------------+ +---------+--------+--+--------+--+---------+  Vertebral PSV cm/s 61 EDV cm/s 19 Antegrade  +---------+--------+--+--------+--+---------+   Summary: Right Carotid: Velocities in the right ICA are consistent with a 40-59%                stenosis. The ECA appears <50% stenosed. Site of endarterectomy,                distal CCA/prox ICA, soft thrombus noted with no significant                velicity changes. Left Carotid: Velocities in the left ICA are consistent with a 1-39% stenosis.               The ECA appears <50% stenosed. Mid ICA boarderline 40-59%. Vertebrals:  Bilateral vertebral arteries demonstrate antegrade flow. Subclavians: Normal flow hemodynamics were seen in bilateral subclavian              arteries. *See table(s) above for measurements and observations.  Electronically signed by Monica Martinez MD on 02/14/2021 at 2:11:13 PM.    Final     Assessment & Plan:   Problem List Items Addressed This Visit     Anemia    Check CBC      Relevant Orders   CBC with Differential/Platelet   Carotid stenosis - Primary    01/2019 status post right carotid endarterectomy. Mild residual stenosis which is likely due to tortuousity. Carotid US is due in 2024 ASA 81 mg a day, Zetia      Relevant Medications   aspirin EC 81 MG tablet   Other Relevant Orders   Comprehensive metabolic panel   CKD (chronic kidney disease) stage 3, GFR 30-59 ml/min (HCC)    Monitor GFR      Relevant Orders   Comprehensive metabolic panel   Gastritis and gastroduodenitis    On Prilosec daily      Insomnia    D/c Zolpidem      Vitamin D deficiency    On Vit D         Meds ordered this encounter  Medications   aspirin EC 81 MG tablet    Sig: Take 1 tablet (81 mg total) by mouth  daily.    Dispense:  100 tablet    Refill:  3      Follow-up: No follow-ups on file.  Walker Kehr, MD

## 2021-02-16 NOTE — Assessment & Plan Note (Signed)
D/c Zolpidem

## 2021-02-16 NOTE — Assessment & Plan Note (Signed)
01/2019 status post right carotid endarterectomy. Mild residual stenosis which is likely due to tortuousity. Carotid US is due in 2024 ASA 81 mg a day, Zetia

## 2021-03-15 ENCOUNTER — Other Ambulatory Visit: Payer: Self-pay | Admitting: Internal Medicine

## 2021-05-12 ENCOUNTER — Telehealth: Payer: Self-pay | Admitting: Internal Medicine

## 2021-05-12 NOTE — Telephone Encounter (Signed)
1.Medication Requested: Cholecalciferol (VITAMIN D) 2000 UNITS CAPS ? ?2. Pharmacy (Name[facility]): [place], [place] ? ?3. On Med List: yes  ? ?4. Last Visit with PCP: 02-16-2021 ? ?5. Next visit date with PCP: 08-17-2021 ? ?Pt requesting a new rx ?

## 2021-05-14 MED ORDER — VITAMIN D 50 MCG (2000 UT) PO CAPS: 2000.0000 [IU] | ORAL_CAPSULE | Freq: Every day | ORAL | 3 refills | Status: DC

## 2021-05-14 NOTE — Telephone Encounter (Signed)
Okay.  Done.  Thanks 

## 2021-05-15 ENCOUNTER — Other Ambulatory Visit: Payer: Self-pay

## 2021-05-15 MED ORDER — VITAMIN D 50 MCG (2000 UT) PO CAPS: 2000.0000 [IU] | ORAL_CAPSULE | Freq: Every day | ORAL | 1 refills | Status: DC

## 2021-07-17 ENCOUNTER — Other Ambulatory Visit: Payer: Self-pay | Admitting: Gastroenterology

## 2021-07-24 DIAGNOSIS — Z961 Presence of intraocular lens: Secondary | ICD-10-CM | POA: Diagnosis not present

## 2021-07-24 DIAGNOSIS — H2512 Age-related nuclear cataract, left eye: Secondary | ICD-10-CM | POA: Diagnosis not present

## 2021-07-24 DIAGNOSIS — H34231 Retinal artery branch occlusion, right eye: Secondary | ICD-10-CM | POA: Diagnosis not present

## 2021-07-24 DIAGNOSIS — H5213 Myopia, bilateral: Secondary | ICD-10-CM | POA: Diagnosis not present

## 2021-08-16 ENCOUNTER — Other Ambulatory Visit: Payer: Self-pay | Admitting: Internal Medicine

## 2021-08-17 ENCOUNTER — Encounter: Payer: Self-pay | Admitting: Internal Medicine

## 2021-08-17 ENCOUNTER — Ambulatory Visit (INDEPENDENT_AMBULATORY_CARE_PROVIDER_SITE_OTHER): Payer: Medicare PPO | Admitting: Internal Medicine

## 2021-08-17 VITALS — BP 120/78 | HR 86 | Temp 98.6°F | Ht 65.0 in | Wt 178.0 lb

## 2021-08-17 DIAGNOSIS — E559 Vitamin D deficiency, unspecified: Secondary | ICD-10-CM | POA: Diagnosis not present

## 2021-08-17 DIAGNOSIS — Z9849 Cataract extraction status, unspecified eye: Secondary | ICD-10-CM | POA: Insufficient documentation

## 2021-08-17 DIAGNOSIS — E785 Hyperlipidemia, unspecified: Secondary | ICD-10-CM

## 2021-08-17 DIAGNOSIS — Z90710 Acquired absence of both cervix and uterus: Secondary | ICD-10-CM | POA: Insufficient documentation

## 2021-08-17 DIAGNOSIS — R739 Hyperglycemia, unspecified: Secondary | ICD-10-CM

## 2021-08-17 DIAGNOSIS — N1831 Chronic kidney disease, stage 3a: Secondary | ICD-10-CM

## 2021-08-17 DIAGNOSIS — N750 Cyst of Bartholin's gland: Secondary | ICD-10-CM | POA: Insufficient documentation

## 2021-08-17 DIAGNOSIS — E611 Iron deficiency: Secondary | ICD-10-CM | POA: Insufficient documentation

## 2021-08-17 DIAGNOSIS — Z8673 Personal history of transient ischemic attack (TIA), and cerebral infarction without residual deficits: Secondary | ICD-10-CM | POA: Diagnosis not present

## 2021-08-17 DIAGNOSIS — Z780 Asymptomatic menopausal state: Secondary | ICD-10-CM | POA: Insufficient documentation

## 2021-08-17 DIAGNOSIS — R809 Proteinuria, unspecified: Secondary | ICD-10-CM | POA: Insufficient documentation

## 2021-08-17 DIAGNOSIS — M1990 Unspecified osteoarthritis, unspecified site: Secondary | ICD-10-CM | POA: Insufficient documentation

## 2021-08-17 LAB — COMPREHENSIVE METABOLIC PANEL
ALT: 17 U/L (ref 0–35)
AST: 17 U/L (ref 0–37)
Albumin: 4.1 g/dL (ref 3.5–5.2)
Alkaline Phosphatase: 89 U/L (ref 39–117)
BUN: 15 mg/dL (ref 6–23)
CO2: 27 mEq/L (ref 19–32)
Calcium: 9.7 mg/dL (ref 8.4–10.5)
Chloride: 109 mEq/L (ref 96–112)
Creatinine, Ser: 1.05 mg/dL (ref 0.40–1.20)
GFR: 53.6 mL/min — ABNORMAL LOW (ref >60.00)
Glucose, Bld: 86 mg/dL (ref 70–99)
Potassium: 4.6 mEq/L (ref 3.5–5.1)
Sodium: 143 mEq/L (ref 135–145)
Total Bilirubin: 0.3 mg/dL (ref 0.2–1.2)
Total Protein: 7.4 g/dL (ref 6.0–8.3)

## 2021-08-17 LAB — HEMOGLOBIN A1C: Hgb A1c MFr Bld: 5.6 % (ref 4.6–6.5)

## 2021-08-17 NOTE — Assessment & Plan Note (Signed)
Monitor GFR Hydrate well 

## 2021-08-17 NOTE — Assessment & Plan Note (Signed)
On Plavix ASA d/c'd S/p R endarterectomy 11/20

## 2021-08-17 NOTE — Progress Notes (Signed)
Subjective:  Patient ID: Judy Carlson, female    DOB: 04/29/1950  Age: 71 y.o. MRN: [id number]  CC: No chief complaint on file.   HPI [name] presents for a 6 mo f/u: h/o TIA, dyslipidemia, HTN  Outpatient Medications Prior to Visit  Medication Sig Dispense Refill   acetaminophen (TYLENOL) 650 MG CR tablet Take 1,300 mg by mouth as needed for pain.      amLODipine (NORVASC) 5 MG tablet Take 1 tablet (5 mg total) by mouth daily. 90 tablet 3   Cholecalciferol (VITAMIN D) 50 MCG (2000 UT) CAPS Take 1 capsule (2,000 Units total) by mouth daily. 100 capsule 1   clopidogrel (PLAVIX) 75 MG tablet Take 75 mg by mouth daily.     ezetimibe (ZETIA) 10 MG tablet TAKE 1 TABLET EVERY DAY 90 tablet 3   furosemide (LASIX) 40 MG tablet TAKE 1/2 TABLET EVERY DAY AS NEEDED 45 tablet 1   metoprolol succinate (TOPROL-XL) 100 MG 24 hr tablet TAKE 1 TABLET DAILY WITH OR IMMEDIATELY FOLLOWING A MEAL. 90 tablet 3   Multiple Vitamin (MULTIVITAMIN) tablet Take 1 tablet by mouth daily.     Olopatadine HCl (PATADAY OP) Place 1 drop into both eyes daily as needed (For dry eyes).     omeprazole (PRILOSEC) 40 MG capsule TAKE 1 CAPSULE EVERY DAY 30 capsule 1   potassium chloride (KLOR-CON) 10 MEQ tablet Take 1 tablet (10 mEq total) by mouth daily. Follow-up appt due must see provider for future refills 90 tablet 0   aspirin EC 81 MG tablet Take 1 tablet (81 mg total) by mouth daily. 100 tablet 3   No facility-administered medications prior to visit.    ROS: Review of Systems  Constitutional:  Negative for activity change, appetite change, chills, fatigue and unexpected weight change.  HENT:  Negative for congestion, mouth sores and sinus pressure.   Eyes:  Negative for visual disturbance.  Respiratory:  Negative for cough and chest tightness.   Gastrointestinal:  Negative for abdominal pain and nausea.  Genitourinary:  Negative for difficulty urinating, frequency and vaginal pain.  Musculoskeletal:   Negative for back pain and gait problem.  Skin:  Negative for pallor and rash.  Neurological:  Negative for dizziness, tremors, weakness, numbness and headaches.  Psychiatric/Behavioral:  Negative for confusion and sleep disturbance.     Objective:  BP 120/78 (BP Location: Right Arm, Patient Position: Sitting, Cuff Size: Normal)   Pulse 86   Temp 98.6 F (37 C) (Oral)   Ht 5\' 5"  (1.651 m)   Wt 178 lb (80.7 kg)   SpO2 92%   BMI 29.62 kg/m   BP Readings from Last 3 Encounters:  08/17/21 120/78  02/16/21 (!) 142/86  02/14/21 134/78    Wt Readings from Last 3 Encounters:  08/17/21 178 lb (80.7 kg)  02/16/21 183 lb 3.2 oz (83.1 kg)  02/14/21 183 lb (83 kg)    Physical Exam Constitutional:      General: She is not in acute distress.    Appearance: She is well-developed.  HENT:     Head: Normocephalic.     Right Ear: External ear normal.     Left Ear: External ear normal.     Nose: Nose normal.  Eyes:     General:        Right eye: No discharge.        Left eye: No discharge.     Conjunctiva/sclera: Conjunctivae normal.     Pupils: Pupils  are equal, round, and reactive to light.  Neck:     Thyroid: No thyromegaly.     Vascular: No JVD.     Trachea: No tracheal deviation.  Cardiovascular:     Rate and Rhythm: Normal rate and regular rhythm.     Heart sounds: Normal heart sounds.  Pulmonary:     Effort: No respiratory distress.     Breath sounds: No stridor. No wheezing.  Abdominal:     General: Bowel sounds are normal. There is no distension.     Palpations: Abdomen is soft. There is no mass.     Tenderness: There is no abdominal tenderness. There is no guarding or rebound.  Musculoskeletal:        General: No tenderness.     Cervical back: Normal range of motion and neck supple. No rigidity.  Lymphadenopathy:     Cervical: No cervical adenopathy.  Skin:    Findings: No erythema or rash.  Neurological:     Cranial Nerves: No cranial nerve deficit.     Motor:  No abnormal muscle tone.     Coordination: Coordination normal.     Deep Tendon Reflexes: Reflexes normal.  Psychiatric:        Behavior: Behavior normal.        Thought Content: Thought content normal.        Judgment: Judgment normal.     Lab Results  Component Value Date   WBC 4.5 02/16/2021   HGB 12.6 02/16/2021   HCT 39.3 02/16/2021   PLT 323.0 02/16/2021   GLUCOSE 104 (H) 02/16/2021   CHOL 242 (H) 12/24/2018   TRIG 101 12/24/2018   HDL 62 12/24/2018   LDLDIRECT 132.9 04/30/2007   LDLCALC 160 (H) 12/24/2018   ALT 23 02/16/2021   AST 20 02/16/2021   NA 144 02/16/2021   K 3.7 02/16/2021   CL 108 02/16/2021   CREATININE 1.15 02/16/2021   BUN 17 02/16/2021   CO2 28 02/16/2021   TSH 2.41 03/19/2019   INR 1.0 01/07/2019   HGBA1C 5.8 (H) 12/24/2018    VAS US CAROTID  Result Date: 02/14/2021 Carotid Arterial Duplex Study Patient Name:  Judy Carlson  Date of Exam:   02/14/2021 Medical Rec #: [id number]        Accession #:    [id number] Date of Birth: 03-09-1950        Patient Gender: F Patient Age:   7 years Exam Location:  [facility] Procedure:      VAS US CAROTID Referring Phys: Jamelle Haring --------------------------------------------------------------------------------  Indications:       TIA and right endarterectomy. Risk Factors:      Hypertension, past history of smoking. Comparison Study:  01/12/20, Rt endarterectomy 01/12/19 Performing Technologist: Antonieta Pert RDMS, RVT  Examination Guidelines: A complete evaluation includes B-mode imaging, spectral Doppler, color Doppler, and power Doppler as needed of all accessible portions of each vessel. Bilateral testing is considered an integral part of a complete examination. Limited examinations for reoccurring indications may be performed as noted.  Right Carotid Findings: +----------+--------+--------+--------+-------------------------------+--------+           PSV cm/sEDV cm/sStenosisPlaque  Description             Comments +----------+--------+--------+--------+-------------------------------+--------+ CCA Prox  75      20                                                      +----------+--------+--------+--------+-------------------------------+--------+  CCA Mid   60      21                                                      +----------+--------+--------+--------+-------------------------------+--------+ CCA Distal58      19              hyperechoic, smooth and                                                   calcific                                +----------+--------+--------+--------+-------------------------------+--------+ ICA Prox  117     45      40-59%                                          +----------+--------+--------+--------+-------------------------------+--------+ ICA Mid   118     44                                                      +----------+--------+--------+--------+-------------------------------+--------+ ICA Distal87      38                                                      +----------+--------+--------+--------+-------------------------------+--------+ ECA       76      17                                                      +----------+--------+--------+--------+-------------------------------+--------+ +----------+--------+-------+----------------+-------------------+           PSV cm/sEDV cmsDescribe        Arm Pressure (mmHG) +----------+--------+-------+----------------+-------------------+ [id number]            Multiphasic, WNL                    +----------+--------+-------+----------------+-------------------+ +---------+--------+--+--------+--+---------+ VertebralPSV cm/s52EDV cm/s14Antegrade +---------+--------+--+--------+--+---------+ site of endarterectomy, distal CCA/prox ICA, soft thrombus noted with no significant velicity changes. Left Carotid Findings:  +----------+--------+--------+--------+------------------+---------------------+           PSV cm/sEDV cm/sStenosisPlaque DescriptionComments              +----------+--------+--------+--------+------------------+---------------------+ CCA Prox  91      24                                                      +----------+--------+--------+--------+------------------+---------------------+ CCA Mid  67      20              focal and                                                                 hyperechoic                             +----------+--------+--------+--------+------------------+---------------------+ CCA Distal75      22              calcific and                                                              diffuse                                 +----------+--------+--------+--------+------------------+---------------------+ ICA Prox  68      27      1-39%   calcific          focal plaque lateral                                                      wall.                 +----------+--------+--------+--------+------------------+---------------------+ ICA Mid   106     40                                boarderline 40-59%    +----------+--------+--------+--------+------------------+---------------------+ ICA Distal80      24                                                      +----------+--------+--------+--------+------------------+---------------------+ ECA       90      14                                                      +----------+--------+--------+--------+------------------+---------------------+ +----------+--------+--------+----------------+-------------------+           PSV cm/sEDV cm/sDescribe        Arm Pressure (mmHG) +----------+--------+--------+----------------+-------------------+ [id number]             Multiphasic, WNL                     +----------+--------+--------+----------------+-------------------+ +---------+--------+--+--------+--+---------+ VertebralPSV cm/s61EDV cm/s19Antegrade +---------+--------+--+--------+--+---------+   Summary: Right Carotid: 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 The ECA appears <50% stenosed. Site of endarterectomy,                distal CCA/prox ICA, soft thrombus noted with no significant                velicity changes. Left Carotid: Velocities in the left ICA are consistent with a 1-39% stenosis.               The ECA appears <50% stenosed. Mid ICA boarderline 40-59%. Vertebrals:  Bilateral vertebral arteries demonstrate antegrade flow. Subclavians: Normal flow hemodynamics were seen in bilateral subclavian              arteries. *See table(s) above for measurements and observations.  Electronically signed by Sherald Hess MD on 02/14/2021 at 2:11:13 PM.    Final     Assessment & Plan:   Problem List Items Addressed This Visit     CKD (chronic kidney disease) stage 3, GFR 30-59 ml/min (HCC)    Monitor GFR Hydrate well      Relevant Orders   Hemoglobin A1c   Comprehensive metabolic panel   Dyslipidemia    On Zetia      History of transient ischemic attack (TIA)    On Plavix ASA d/c'd S/p R endarterectomy 11/20      Hyperglycemia - Primary   Relevant Orders   Hemoglobin A1c   Comprehensive metabolic panel   Vitamin D deficiency      No orders of the defined types were placed in this encounter.     Follow-up: Return in about 6 months (around 02/16/2022) for Wellness Exam.  Sonda Primes, MD

## 2021-08-17 NOTE — Assessment & Plan Note (Signed)
On Zetia 

## 2021-09-14 ENCOUNTER — Other Ambulatory Visit: Payer: Self-pay | Admitting: Internal Medicine

## 2021-10-12 ENCOUNTER — Other Ambulatory Visit: Payer: Self-pay | Admitting: Vascular Surgery

## 2021-10-17 ENCOUNTER — Telehealth: Payer: Self-pay | Admitting: Internal Medicine

## 2021-10-17 NOTE — Telephone Encounter (Signed)
LVM for pt to rtn my call to schedule awv with nha call back # [phone] 

## 2021-10-18 ENCOUNTER — Telehealth: Payer: Self-pay | Admitting: Gastroenterology

## 2021-10-18 ENCOUNTER — Other Ambulatory Visit: Payer: Self-pay | Admitting: Gastroenterology

## 2021-10-18 NOTE — Telephone Encounter (Signed)
Left message to return call 

## 2021-10-18 NOTE — Telephone Encounter (Signed)
This patient of mine was last seen in the office June 2022, and I have been refilling her omeprazole since then.  I received a refill request for this medicine and recommend the following:  Discontinue the omeprazole and change to pantoprazole 40 mg once daily. (Disp #90, RF zero)  - There is a potential interaction between omeprazole and Plavix (clopidogrel)  She needs an office appointment to see me in order to discuss her digestive issues to determine long-term plan for acid suppression medication.  -HD

## 2021-10-21 ENCOUNTER — Other Ambulatory Visit: Payer: Self-pay | Admitting: Internal Medicine

## 2021-10-23 NOTE — Telephone Encounter (Signed)
Sent Mychart message to patient regarding medication chg Danis would like to make. Patient needs an appointment to discuss

## 2021-10-27 ENCOUNTER — Other Ambulatory Visit: Payer: Self-pay | Admitting: Gastroenterology

## 2021-10-27 ENCOUNTER — Other Ambulatory Visit: Payer: Self-pay

## 2021-10-27 ENCOUNTER — Ambulatory Visit (INDEPENDENT_AMBULATORY_CARE_PROVIDER_SITE_OTHER): Payer: Medicare PPO

## 2021-10-27 VITALS — Ht 65.0 in | Wt 178.0 lb

## 2021-10-27 DIAGNOSIS — Z0000 Encounter for general adult medical examination without abnormal findings: Secondary | ICD-10-CM

## 2021-10-27 DIAGNOSIS — Z122 Encounter for screening for malignant neoplasm of respiratory organs: Secondary | ICD-10-CM | POA: Diagnosis not present

## 2021-10-27 MED ORDER — PANTOPRAZOLE SODIUM 40 MG PO TBEC: 40.0000 mg | DELAYED_RELEASE_TABLET | Freq: Every day | ORAL | 0 refills | Status: DC
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
## 2021-10-27 NOTE — Progress Notes (Signed)
Refill of omeprazole refused. Changed to pantoprazole 40 mg QD per Dr. Myrtie Neither due to the fact that she is also on Plavix.. Patient needs an appointment to discuss

## 2021-10-27 NOTE — Patient Instructions (Signed)
Judy Carlson , Thank you for taking time to come for your Medicare Wellness Visit. I appreciate your ongoing commitment to your health goals. Please review the following plan we discussed and let me know if I can assist you in the future.   Screening recommendations/referrals: Colonoscopy: 07/07/2020  due 07/2021 Mammogram: 02/03/2021 Bone Density: 07/24/2017 Recommended yearly ophthalmology/optometry visit for glaucoma screening and checkup Recommended yearly dental visit for hygiene and checkup  Vaccinations: Influenza vaccine: completed  Pneumococcal vaccine: completed  Tdap vaccine: 04/03/2018 Shingles vaccine: completed    Covid-19:completed   Advanced directives: none   Conditions/risks identified: Patient to schedule mammogram 02/2022  Next appointment: Follow up in one year for your annual wellness visit    Preventive Care 65 Years and Older, Female Preventive care refers to lifestyle choices and visits with your health care provider that can promote health and wellness. What does preventive care include? A yearly physical exam. This is also called an annual well check. Dental exams once or twice a year. Routine eye exams. Ask your health care provider how often you should have your eyes checked. Personal lifestyle choices, including: Daily care of your teeth and gums. Regular physical activity. Eating a healthy diet. Avoiding tobacco and drug use. Limiting alcohol use. Practicing safe sex. Taking low-dose aspirin every day. Taking vitamin and mineral supplements as recommended by your health care provider. What happens during an annual well check? The services and screenings done by your health care provider during your annual well check will depend on your age, overall health, lifestyle risk factors, and family history of disease. Counseling  Your health care provider may ask you questions about your: Alcohol use. Tobacco use. Drug use. Emotional well-being. Home  and relationship well-being. Sexual activity. Eating habits. History of falls. Memory and ability to understand (cognition). Work and work Astronomer. Reproductive health. Screening  You may have the following tests or measurements: Height, weight, and BMI. Blood pressure. Lipid and cholesterol levels. These may be checked every 5 years, or more frequently if you are over 34 years old. Skin check. Lung cancer screening. You may have this screening every year starting at age 98 if you have a 30-pack-year history of smoking and currently smoke or have quit within the past 15 years. Fecal occult blood test (FOBT) of the stool. You may have this test every year starting at age 33. Flexible sigmoidoscopy or colonoscopy. You may have a sigmoidoscopy every 5 years or a colonoscopy every 10 years starting at age 41. Hepatitis C blood test. Hepatitis B blood test. Sexually transmitted disease (STD) testing. Diabetes screening. This is done by checking your blood sugar (glucose) after you have not eaten for a while (fasting). You may have this done every 1-3 years. Bone density scan. This is done to screen for osteoporosis. You may have this done starting at age 77. Mammogram. This may be done every 1-2 years. Talk to your health care provider about how often you should have regular mammograms. Talk with your health care provider about your test results, treatment options, and if necessary, the need for more tests. Vaccines  Your health care provider may recommend certain vaccines, such as: Influenza vaccine. This is recommended every year. Tetanus, diphtheria, and acellular pertussis (Tdap, Td) vaccine. You may need a Td booster every 10 years. Zoster vaccine. You may need this after age 35. Pneumococcal 13-valent conjugate (PCV13) vaccine. One dose is recommended after age 34. Pneumococcal polysaccharide (PPSV23) vaccine. One dose is recommended after age 82.  Talk to your health care provider  about which screenings and vaccines you need and how often you need them. This information is not intended to replace advice given to you by your health care provider. Make sure you discuss any questions you have with your health care provider. Document Released: 03/18/2015 Document Revised: 11/09/2015 Document Reviewed: 12/21/2014 Elsevier Interactive Patient Education  2017 [place] Prevention in the Home Falls can cause injuries. They can happen to people of all ages. There are many things you can do to make your home safe and to help prevent falls. What can I do on the outside of my home? Regularly fix the edges of walkways and driveways and fix any cracks. Remove anything that might make you trip as you walk through a door, such as a raised step or threshold. Trim any bushes or trees on the path to your home. Use bright outdoor lighting. Clear any walking paths of anything that might make someone trip, such as rocks or tools. Regularly check to see if handrails are loose or broken. Make sure that both sides of any steps have handrails. Any raised decks and porches should have guardrails on the edges. Have any leaves, snow, or ice cleared regularly. Use sand or salt on walking paths during winter. Clean up any spills in your garage right away. This includes oil or grease spills. What can I do in the bathroom? Use night lights. Install grab bars by the toilet and in the tub and shower. Do not use towel bars as grab bars. Use non-skid mats or decals in the tub or shower. If you need to sit down in the shower, use a plastic, non-slip stool. Keep the floor dry. Clean up any water that spills on the floor as soon as it happens. Remove soap buildup in the tub or shower regularly. Attach bath mats securely with double-sided non-slip rug tape. Do not have throw rugs and other things on the floor that can make you trip. What can I do in the bedroom? Use night lights. Make sure  that you have a light by your bed that is easy to reach. Do not use any sheets or blankets that are too big for your bed. They should not hang down onto the floor. Have a firm chair that has side arms. You can use this for support while you get dressed. Do not have throw rugs and other things on the floor that can make you trip. What can I do in the kitchen? Clean up any spills right away. Avoid walking on wet floors. Keep items that you use a lot in easy-to-reach places. If you need to reach something above you, use a strong step stool that has a grab bar. Keep electrical cords out of the way. Do not use floor polish or wax that makes floors slippery. If you must use wax, use non-skid floor wax. Do not have throw rugs and other things on the floor that can make you trip. What can I do with my stairs? Do not leave any items on the stairs. Make sure that there are handrails on both sides of the stairs and use them. Fix handrails that are broken or loose. Make sure that handrails are as long as the stairways. Check any carpeting to make sure that it is firmly attached to the stairs. Fix any carpet that is loose or worn. Avoid having throw rugs at the top or bottom of the stairs. If you do have throw  rugs, attach them to the floor with carpet tape. Make sure that you have a light switch at the top of the stairs and the bottom of the stairs. If you do not have them, ask someone to add them for you. What else can I do to help prevent falls? Wear shoes that: Do not have high heels. Have rubber bottoms. Are comfortable and fit you well. Are closed at the toe. Do not wear sandals. If you use a stepladder: Make sure that it is fully opened. Do not climb a closed stepladder. Make sure that both sides of the stepladder are locked into place. Ask someone to hold it for you, if possible. Clearly mark and make sure that you can see: Any grab bars or handrails. First and last steps. Where the edge of  each step is. Use tools that help you move around (mobility aids) if they are needed. These include: Canes. Walkers. Scooters. Crutches. Turn on the lights when you go into a dark area. Replace any light bulbs as soon as they burn out. Set up your furniture so you have a clear path. Avoid moving your furniture around. If any of your floors are uneven, fix them. If there are any pets around you, be aware of where they are. Review your medicines with your doctor. Some medicines can make you feel dizzy. This can increase your chance of falling. Ask your doctor what other things that you can do to help prevent falls. This information is not intended to replace advice given to you by your health care provider. Make sure you discuss any questions you have with your health care provider. Document Released: 12/16/2008 Document Revised: 07/28/2015 Document Reviewed: 03/26/2014 Elsevier Interactive Patient Education  2017 [employer].

## 2021-11-03 ENCOUNTER — Other Ambulatory Visit: Payer: Self-pay | Admitting: Internal Medicine

## 2021-11-03 NOTE — Telephone Encounter (Signed)
Follow up has been scheduled. Pt is aware of the medication change request.

## 2021-11-17 ENCOUNTER — Encounter: Payer: Self-pay | Admitting: Internal Medicine

## 2021-11-17 ENCOUNTER — Other Ambulatory Visit: Payer: Self-pay | Admitting: Gastroenterology

## 2021-11-24 ENCOUNTER — Other Ambulatory Visit: Payer: Self-pay

## 2021-11-24 ENCOUNTER — Encounter: Payer: Self-pay | Admitting: Internal Medicine

## 2021-11-24 MED ORDER — FLUAD QUADRIVALENT 0.5 ML IM PRSY
PREFILLED_SYRINGE | INTRAMUSCULAR | 0 refills | Status: DC
  Filled 2021-11-24 (×2): qty 0.5, 1d supply, fill #0

## 2021-11-24 MED ORDER — ZOSTER VAC RECOMB ADJUVANTED 50 MCG/0.5ML IM SUSR
INTRAMUSCULAR | 0 refills | Status: DC
  Filled 2021-11-24 – 2021-11-27 (×2): qty 0.5, 1d supply, fill #0

## 2021-11-27 ENCOUNTER — Other Ambulatory Visit: Payer: Self-pay

## 2021-11-27 NOTE — Telephone Encounter (Signed)
Shingles & flu has been updated.Marland KitchenJohny Chess

## 2021-11-28 ENCOUNTER — Other Ambulatory Visit: Payer: Self-pay

## 2021-12-07 ENCOUNTER — Other Ambulatory Visit: Payer: Self-pay

## 2021-12-07 DIAGNOSIS — F17210 Nicotine dependence, cigarettes, uncomplicated: Secondary | ICD-10-CM

## 2021-12-07 DIAGNOSIS — Z87891 Personal history of nicotine dependence: Secondary | ICD-10-CM

## 2021-12-07 DIAGNOSIS — Z122 Encounter for screening for malignant neoplasm of respiratory organs: Secondary | ICD-10-CM

## 2021-12-11 ENCOUNTER — Ambulatory Visit: Payer: Medicare PPO | Admitting: Gastroenterology

## 2021-12-11 ENCOUNTER — Encounter: Payer: Self-pay | Admitting: Gastroenterology

## 2021-12-11 VITALS — BP 138/82 | HR 81 | Ht 65.0 in | Wt 182.4 lb

## 2021-12-11 DIAGNOSIS — R12 Heartburn: Secondary | ICD-10-CM

## 2021-12-11 DIAGNOSIS — D500 Iron deficiency anemia secondary to blood loss (chronic): Secondary | ICD-10-CM

## 2021-12-11 NOTE — Progress Notes (Signed)
[facility] Progress Note  Chief Complaint: Heartburn and iron deficiency anemia  Subjective  History: Judy Carlson follows up for iron deficiency anemia and a history of H. pylori (see 08/11/2020 office visit for details).  Posttreatment urea breath test - July 2022.  Availability of capsule study showed a diminutive duodenal ulcer that was likely aspirin related.  She had been on DAPT for vascular interventions, and after communication with her vascular surgeon, the decision was for her to stay on daily Plavix without aspirin. I have been following her blood counts since then and about 2 months ago change omeprazole to pantoprazole due to potential interaction with the Plavix.  Judy Carlson is feeling well these days from a digestive standpoint.  Denies abdominal pain altered bowel habits nausea or vomiting.  Her anemia long since resolved and she stopped taking iron.  She remains on pantoprazole 40 mg daily.  She recalls having had intermittent heartburn or regurgitation prior to the May '22 hospitalization, but infrequently and I think she felt needed medication.  ROS: Cardiovascular:  no chest pain Respiratory: no dyspnea Chronic fatigue The patient's Past Medical, Family and Social History were reviewed and are on file in the EMR.  Objective:  Med list reviewed  Current Outpatient Medications:    acetaminophen (TYLENOL) 650 MG CR tablet, Take 1,300 mg by mouth as needed for pain. , Disp: , Rfl:    amLODipine (NORVASC) 5 MG tablet, TAKE 1 TABLET EVERY DAY, Disp: 90 tablet, Rfl: 3   Cholecalciferol (VITAMIN D) 50 MCG (2000 UT) CAPS, Take 1 capsule (2,000 Units total) by mouth daily., Disp: 100 capsule, Rfl: 1   clopidogrel (PLAVIX) 75 MG tablet, TAKE 1 TABLET EVERY DAY, Disp: 90 tablet, Rfl: 3   ezetimibe (ZETIA) 10 MG tablet, TAKE 1 TABLET EVERY DAY, Disp: 90 tablet, Rfl: 3   furosemide (LASIX) 40 MG tablet, TAKE 1/2 TABLET EVERY DAY AS NEEDED, Disp: 45 tablet, Rfl: 1   influenza vaccine  adjuvanted (FLUAD QUADRIVALENT) 0.5 ML injection, Inject into the muscle., Disp: 0.5 mL, Rfl: 0   metoprolol succinate (TOPROL-XL) 100 MG 24 hr tablet, TAKE 1 TABLET DAILY WITH OR IMMEDIATELY FOLLOWING A MEAL., Disp: 90 tablet, Rfl: 3   Multiple Vitamin (MULTIVITAMIN) tablet, Take 1 tablet by mouth daily., Disp: , Rfl:    Olopatadine HCl (PATADAY OP), Place 1 drop into both eyes daily as needed (For dry eyes)., Disp: , Rfl:    pantoprazole (PROTONIX) 40 MG tablet, TAKE 1 TABLET DAILY (STOP OMERPRAZOLE; NEED APPT WITH DR. DANIS. CALL [phone]), Disp: 90 tablet, Rfl: 0   potassium chloride (KLOR-CON) 10 MEQ tablet, Take 1 tablet (10 mEq total) by mouth daily., Disp: 90 tablet, Rfl: 2   Zoster Vaccine Adjuvanted [facility]) injection, Inject into the muscle., Disp: 0.5 mL, Rfl: 0   Vital signs in last 24 hrs: Vitals:   12/11/21 1510  BP: 138/82  Pulse: 81   Wt Readings from Last 3 Encounters:  12/11/21 182 lb 6 oz (82.7 kg)  10/27/21 178 lb (80.7 kg)  08/17/21 178 lb (80.7 kg)    Physical Exam  Well-appearing HEENT: sclera anicteric, oral mucosa moist without lesions Neck: supple, no thyromegaly, JVD or lymphadenopathy Cardiac: Regular without murmur,  no peripheral edema Pulm: clear to auscultation bilaterally, normal RR and effort noted Abdomen: soft, no tenderness, with active bowel sounds. No guarding or palpable hepatosplenomegaly.  Labs:     Latest Ref Rng & Units 02/16/2021    8:49 AM 11/29/2020  1:32 PM 09/26/2020    9:53 AM  CBC  WBC 4.0 - 10.5 K/uL 4.5  3.7  3.7   Hemoglobin 12.0 - 15.0 g/dL 12.6  12.3  11.8   Hematocrit 36.0 - 46.0 % 39.3  38.1  37.6   Platelets 150.0 - 400.0 K/uL 323.0  322.0  333     ___________________________________________ Radiologic studies:   ____________________________________________ Other:   _____________________________________________ Assessment & Plan  Assessment: Encounter Diagnoses  Name Primary?   Heartburn Yes    Iron deficiency anemia due to chronic blood loss    Her anemia of GI blood loss is long since resolved.  Residual small ulcer seen on video capsule study will also have long since resolved by now since she is no longer on aspirin and H. pylori was cleared with treatment.  Her pre-existing heartburn sounds like it was mild and probably does not need regular use of acid suppression medicine. Plan: Decrease pantoprazole to 1 tablet every other day for 7 to 10 days and then discontinue. Contact me if she has recurrence of reflux symptoms. See me as needed   Nelida Meuse III

## 2021-12-11 NOTE — Patient Instructions (Signed)
_______________________________________________________  If you are age 71 or older, your body mass index should be between 23-30. Your Body mass index is 30.35 kg/m. If this is out of the aforementioned range listed, please consider follow up with your Primary Care Provider.  If you are age 72 or younger, your body mass index should be between 19-25. Your Body mass index is 30.35 kg/m. If this is out of the aformentioned range listed, please consider follow up with your Primary Care Provider.   ________________________________________________________  The [facility] providers would like to encourage you to use [facility] to communicate with providers for non-urgent requests or questions.  Due to long hold times on the telephone, sending your provider a message by [facility] may be a faster and more efficient way to get a response.  Please allow 48 business hours for a response.  Please remember that this is for non-urgent requests.  _______________________________________________________  Decrease the Protonix to every other day for 7-10 days then stop.  It was a pleasure to see you today!  Thank you for trusting me with your gastrointestinal care!

## 2021-12-13 ENCOUNTER — Other Ambulatory Visit: Payer: Self-pay

## 2021-12-13 MED ORDER — COVID-19 MRNA 2023-2024 VACCINE (COMIRNATY) 0.3 ML INJECTION
INTRAMUSCULAR | 0 refills | Status: DC
  Filled 2021-12-14: qty 0.3, 1d supply, fill #0

## 2021-12-14 ENCOUNTER — Other Ambulatory Visit: Payer: Self-pay

## 2021-12-29 ENCOUNTER — Other Ambulatory Visit: Payer: Self-pay | Admitting: Internal Medicine

## 2021-12-29 DIAGNOSIS — Z1231 Encounter for screening mammogram for malignant neoplasm of breast: Secondary | ICD-10-CM

## 2022-01-08 ENCOUNTER — Ambulatory Visit: Payer: Medicare PPO | Admitting: Gastroenterology

## 2022-01-09 ENCOUNTER — Ambulatory Visit (INDEPENDENT_AMBULATORY_CARE_PROVIDER_SITE_OTHER): Payer: Medicare PPO | Admitting: Acute Care

## 2022-01-09 ENCOUNTER — Encounter: Payer: Self-pay | Admitting: Acute Care

## 2022-01-09 DIAGNOSIS — F17210 Nicotine dependence, cigarettes, uncomplicated: Secondary | ICD-10-CM

## 2022-01-09 NOTE — Progress Notes (Signed)
Virtual Visit via Telephone Note  I connected with Judy Carlson on 01/09/22 at 10:00 AM EST by telephone and verified that I am speaking with the correct person using two identifiers.  Location: Patient:  At home Provider:  [address] [place], [place], [place], [address]    I discussed the limitations, risks, security and privacy concerns of performing an evaluation and management service by telephone and the availability of in person appointments. I also discussed with the patient that there may be a patient responsible charge related to this service. The patient expressed understanding and agreed to proceed.    Shared Decision Making Visit Lung Cancer Screening Program [phone])   Eligibility: Age 71 y.o. Pack Years Smoking History Calculation 49 pack year smoking history (# packs/per year x # years smoked) Recent History of coughing up blood  no Unexplained weight loss? no ( >Than 15 pounds within the last 6 months ) Prior History Lung / other cancer no (Diagnosis within the last 5 years already requiring surveillance chest CT Scans). Smoking Status Current Smoker Former Smokers: Years since quit:  NA  Quit Date:  NA  Visit Components: Discussion included one or more decision making aids. yes Discussion included risk/benefits of screening. yes Discussion included potential follow up diagnostic testing for abnormal scans. yes Discussion included meaning and risk of over diagnosis. yes Discussion included meaning and risk of False Positives. yes Discussion included meaning of total radiation exposure. yes  Counseling Included: Importance of adherence to annual lung cancer LDCT screening. yes Impact of comorbidities on ability to participate in the program. yes Ability and willingness to under diagnostic treatment. yes  Smoking Cessation Counseling: Current Smokers:  Discussed importance of smoking cessation. yes Information about tobacco cessation classes and  interventions provided to patient. yes Patient provided with "ticket" for LDCT Scan. yes Symptomatic Patient. no  Counseling NA Diagnosis Code: Tobacco Use Z72.0 Asymptomatic Patient yes  Counseling (Intermediate counseling: > three minutes counseling) [id number] Former Smokers:  Discussed the importance of maintaining cigarette abstinence. yes Diagnosis Code: Personal History of Nicotine Dependence. M35.361 Information about tobacco cessation classes and interventions provided to patient. Yes Patient provided with "ticket" for LDCT Scan. yes Written Order for Lung Cancer Screening with LDCT placed in Epic. Yes (CT Chest Lung Cancer Screening Low Dose W/O CM) [id number] Z12.2-Screening of respiratory organs Z87.891-Personal history of nicotine dependence  I have spent 25 minutes of face to face/ virtual visit   time with  Judy Carlson discussing the risks and benefits of lung cancer screening. We viewed / discussed a power point together that explained in detail the above noted topics. We paused at intervals to allow for questions to be asked and answered to ensure understanding.We discussed that the single most powerful action that she can take to decrease her risk of developing lung cancer is to quit smoking. We discussed whether or not she is ready to commit to setting a quit date. We discussed options for tools to aid in quitting smoking including nicotine replacement therapy, non-nicotine medications, support groups, Quit Smart classes, and behavior modification. We discussed that often times setting smaller, more achievable goals, such as eliminating 1 cigarette a day for a week and then 2 cigarettes a day for a week can be helpful in slowly decreasing the number of cigarettes smoked. This allows for a sense of accomplishment as well as providing a clinical benefit. I provided  her  with smoking cessation  information  with contact information for community resources,  classes, free nicotine replacement  therapy, and access to mobile apps, text messaging, and on-line smoking cessation help. I have also provided  her  the office contact information in the event she needs to contact me, or the screening staff. We discussed the time and location of the scan, and that either Abigail Miyamoto RN, Karlton Lemon, RN  or I will call / send a letter with the results within 24-72 hours of receiving them. The patient verbalized understanding of all of  the above and had no further questions upon leaving the office. They have my contact information in the event they have any further questions.  I spent 3 minutes counseling on smoking cessation and the health risks of continued tobacco abuse.  I explained to the patient that there has been a high incidence of coronary artery disease noted on these exams. I explained that this is a non-gated exam therefore degree or severity cannot be determined. This patient is not on statin therapy. I have asked the patient to follow-up with their PCP regarding any incidental finding of coronary artery disease and management with diet or medication as their PCP  feels is clinically indicated. The patient verbalized understanding of the above and had no further questions upon completion of the visit.       Bevelyn Ngo, NP 01/09/2022

## 2022-01-09 NOTE — Patient Instructions (Signed)
Thank you for participating in the [facility] Cancer Screening Program. It was our pleasure to meet you today. We will call you with the results of your scan within the next few days. Your scan will be assigned a Lung RADS category score by the physicians reading the scans.  This Lung RADS score determines follow up scanning.  See below for description of categories, and follow up screening recommendations. We will be in touch to schedule your follow up screening annually or based on recommendations of our providers. We will fax a copy of your scan results to your Primary Care Physician, or the physician who referred you to the program, to ensure they have the results. Please call the office if you have any questions or concerns regarding your scanning experience or results.  Our office number is [phone]. Please speak with Denise Phelps, RN. , or  Denise Buckner RN, They are  our Lung Cancer Screening RN.'s If They are unavailable when you call, Please leave a message on the voice mail. We will return your call at our earliest convenience.This voice mail is monitored several times a day.  Remember, if your scan is normal, we will scan you annually as long as you continue to meet the criteria for the program. (Age 55-77, Current smoker or smoker who has quit within the last 15 years). If you are a smoker, remember, quitting is the single most powerful action that you can take to decrease your risk of lung cancer and other pulmonary, breathing related problems. We know quitting is hard, and we are here to help.  Please let us know if there is anything we can do to help you meet your goal of quitting. If you are a former smoker, congratulations. We are proud of you! Remain smoke free! Remember you can refer friends or family members through the number above.  We will screen them to make sure they meet criteria for the program. Thank you for helping us take better care of you by  participating in Lung Screening.  You can receive free nicotine replacement therapy ( patches, gum or mints) by calling 1-800-QUIT NOW. Please call so we can get you on the path to becoming  a non-smoker. I know it is hard, but you can do this!  Lung RADS Categories:  Lung RADS 1: no nodules or definitely non-concerning nodules.  Recommendation is for a repeat annual scan in 12 months.  Lung RADS 2:  nodules that are non-concerning in appearance and behavior with a very low likelihood of becoming an active cancer. Recommendation is for a repeat annual scan in 12 months.  Lung RADS 3: nodules that are probably non-concerning , includes nodules with a low likelihood of becoming an active cancer.  Recommendation is for a 6-month repeat screening scan. Often noted after an upper respiratory illness. We will be in touch to make sure you have no questions, and to schedule your 6-month scan.  Lung RADS 4 A: nodules with concerning findings, recommendation is most often for a follow up scan in 3 months or additional testing based on our provider's assessment of the scan. We will be in touch to make sure you have no questions and to schedule the recommended 3 month follow up scan.  Lung RADS 4 B:  indicates findings that are concerning. We will be in touch with you to schedule additional diagnostic testing based on our provider's  assessment of the scan.  Other options for assistance in smoking cessation (   As covered by your insurance benefits)  Hypnosis for smoking cessation  [employer] [phone]  Acupuncture for smoking cessation  [facility] [phone]   

## 2022-01-10 ENCOUNTER — Ambulatory Visit
Admission: RE | Admit: 2022-01-10 | Discharge: 2022-01-10 | Disposition: A | Discharge status: Home / Self Care | Payer: Medicare PPO | Source: Ambulatory Visit | Attending: Internal Medicine | Admitting: Internal Medicine

## 2022-01-10 DIAGNOSIS — F17210 Nicotine dependence, cigarettes, uncomplicated: Secondary | ICD-10-CM | POA: Diagnosis not present

## 2022-01-10 DIAGNOSIS — Z122 Encounter for screening for malignant neoplasm of respiratory organs: Secondary | ICD-10-CM

## 2022-01-10 DIAGNOSIS — Z87891 Personal history of nicotine dependence: Secondary | ICD-10-CM

## 2022-01-12 ENCOUNTER — Other Ambulatory Visit (HOSPITAL_BASED_OUTPATIENT_CLINIC_OR_DEPARTMENT_OTHER): Payer: Self-pay

## 2022-01-12 ENCOUNTER — Other Ambulatory Visit: Payer: Self-pay

## 2022-01-12 ENCOUNTER — Telehealth: Payer: Self-pay | Admitting: Acute Care

## 2022-01-12 NOTE — Telephone Encounter (Signed)
Returned call to [facility] Radiology. They were calling in regards to the lung cancer screening CT the patient had on 11/08. I advised them that we have access to the report.   Below is a copy of the impression:   IMPRESSION: 1. 6.6 mm right middle lobe nodule. Lung-RADS 3, probably benign findings. Short-term follow-up in 6 months is recommended with repeat low-dose chest CT without contrast (please use the following order, "CT CHEST LCS NODULE FOLLOW-UP W/O CM"). These results will be called to the ordering clinician or representative by the Radiologist Assistant, and communication documented in the PACS or [employer]. 2.  Aortic atherosclerosis (ICD10-I70.0). 3.  Emphysema (ICD10-J43.9).     Electronically Signed   By: Leanna Battles M.D.   On: 01/12/2022 11:27  Will route to Maralyn Sago and the lung cancer screen pool for follow up.

## 2022-01-16 ENCOUNTER — Other Ambulatory Visit (HOSPITAL_BASED_OUTPATIENT_CLINIC_OR_DEPARTMENT_OTHER): Payer: Self-pay

## 2022-01-17 ENCOUNTER — Telehealth: Payer: Self-pay | Admitting: Acute Care

## 2022-01-17 DIAGNOSIS — Z87891 Personal history of nicotine dependence: Secondary | ICD-10-CM

## 2022-01-17 DIAGNOSIS — R911 Solitary pulmonary nodule: Secondary | ICD-10-CM

## 2022-01-17 NOTE — Telephone Encounter (Signed)
I have called the patient with the results of the low dose CT Chest. I explained that the scan was read as a . Lung-RADS 3, probably benign findings. Short-term follow-up in 6 months is recommended.6.6  mm right middle lobe nodule Pt. Verbalized understanding of the above and is ain agreement with a 6 month follow up scan. Angelique Blonder, please place order for 6 month follow up scan and fax results to PCP. Thanks so much

## 2022-01-18 NOTE — Telephone Encounter (Signed)
CT results faxed to PCP with follow up plans included. Order placed for 6 mth nodule f/u LCS CT.  

## 2022-02-05 ENCOUNTER — Ambulatory Visit
Admission: RE | Admit: 2022-02-05 | Discharge: 2022-02-05 | Disposition: A | Discharge status: Home / Self Care | Payer: Medicare PPO | Source: Ambulatory Visit | Attending: Internal Medicine | Admitting: Internal Medicine

## 2022-02-05 DIAGNOSIS — Z1231 Encounter for screening mammogram for malignant neoplasm of breast: Secondary | ICD-10-CM | POA: Insufficient documentation

## 2022-02-08 ENCOUNTER — Other Ambulatory Visit: Payer: Self-pay

## 2022-02-08 MED ORDER — ZOSTER VAC RECOMB ADJUVANTED 50 MCG/0.5ML IM SUSR
INTRAMUSCULAR | 0 refills | Status: DC
  Filled 2022-02-08: qty 0.5, 1d supply, fill #0

## 2022-02-15 ENCOUNTER — Encounter: Payer: Self-pay | Admitting: Internal Medicine

## 2022-02-15 ENCOUNTER — Ambulatory Visit (INDEPENDENT_AMBULATORY_CARE_PROVIDER_SITE_OTHER): Payer: Medicare PPO | Admitting: Internal Medicine

## 2022-02-15 VITALS — BP 172/90 | HR 80 | Temp 98.1°F | Ht 65.0 in | Wt 185.4 lb

## 2022-02-15 DIAGNOSIS — I10 Essential (primary) hypertension: Secondary | ICD-10-CM

## 2022-02-15 DIAGNOSIS — E876 Hypokalemia: Secondary | ICD-10-CM | POA: Diagnosis not present

## 2022-02-15 DIAGNOSIS — R7302 Impaired glucose tolerance (oral): Secondary | ICD-10-CM | POA: Diagnosis not present

## 2022-02-15 DIAGNOSIS — N1831 Chronic kidney disease, stage 3a: Secondary | ICD-10-CM

## 2022-02-15 LAB — COMPREHENSIVE METABOLIC PANEL
ALT: 22 U/L (ref 0–35)
AST: 19 U/L (ref 0–37)
Albumin: 4.2 g/dL (ref 3.5–5.2)
Alkaline Phosphatase: 69 U/L (ref 39–117)
BUN: 14 mg/dL (ref 6–23)
CO2: 27 mEq/L (ref 19–32)
Calcium: 10 mg/dL (ref 8.4–10.5)
Chloride: 108 mEq/L (ref 96–112)
Creatinine, Ser: 1.02 mg/dL (ref 0.40–1.20)
GFR: 55.3 mL/min — ABNORMAL LOW (ref >60.00)
Glucose, Bld: 105 mg/dL — ABNORMAL HIGH (ref 70–99)
Potassium: 3.9 mEq/L (ref 3.5–5.1)
Sodium: 142 mEq/L (ref 135–145)
Total Bilirubin: 0.2 mg/dL (ref 0.2–1.2)
Total Protein: 7.3 g/dL (ref 6.0–8.3)

## 2022-02-15 LAB — HEMOGLOBIN A1C: Hgb A1c MFr Bld: 6.1 % (ref 4.6–6.5)

## 2022-02-15 NOTE — Assessment & Plan Note (Signed)
Monitor GFR Hydrate well 

## 2022-02-15 NOTE — Assessment & Plan Note (Signed)
Check CMET. 

## 2022-02-15 NOTE — Progress Notes (Signed)
Subjective:  Patient ID: Judy Carlson, female    DOB: 10-15-1950  Age: 71 y.o. MRN: [id number]  CC: Follow-up (6 month f/u)   HPI Judy BrochureJoyce S Jim presents for HTN, anemia, OA, CRI  Outpatient Medications Prior to Visit  Medication Sig Dispense Refill   acetaminophen (TYLENOL) 650 MG CR tablet Take 1,300 mg by mouth as needed for pain.      amLODipine (NORVASC) 5 MG tablet TAKE 1 TABLET EVERY DAY 90 tablet 3   Cholecalciferol (VITAMIN D) 50 MCG (2000 UT) CAPS Take 1 capsule (2,000 Units total) by mouth daily. 100 capsule 1   clopidogrel (PLAVIX) 75 MG tablet TAKE 1 TABLET EVERY DAY 90 tablet 3   COVID-19 mRNA vaccine 2023-2024 (COMIRNATY) SUSP injection Inject into the muscle. 0.3 mL 0   ezetimibe (ZETIA) 10 MG tablet TAKE 1 TABLET EVERY DAY 90 tablet 3   furosemide (LASIX) 40 MG tablet TAKE 1/2 TABLET EVERY DAY AS NEEDED 45 tablet 1   metoprolol succinate (TOPROL-XL) 100 MG 24 hr tablet TAKE 1 TABLET DAILY WITH OR IMMEDIATELY FOLLOWING A MEAL. 90 tablet 3   Multiple Vitamin (MULTIVITAMIN) tablet Take 1 tablet by mouth daily.     Olopatadine HCl (PATADAY OP) Place 1 drop into both eyes daily as needed (For dry eyes).     potassium chloride (KLOR-CON) 10 MEQ tablet Take 1 tablet (10 mEq total) by mouth daily. 90 tablet 2   influenza vaccine adjuvanted (FLUAD QUADRIVALENT) 0.5 ML injection Inject into the muscle. (Patient not taking: Reported on 02/15/2022) 0.5 mL 0   pantoprazole (PROTONIX) 40 MG tablet TAKE 1 TABLET DAILY (STOP OMERPRAZOLE; NEED APPT WITH DR. DANIS. CALL [phone]) 90 tablet 0   Zoster Vaccine Adjuvanted [facility](SHINGRIX) injection Inject into the muscle. (2nd) (Patient not taking: Reported on 02/15/2022) 0.5 mL 0   No facility-administered medications prior to visit.    ROS: Review of Systems  Constitutional:  Negative for activity change, appetite change, chills, fatigue and unexpected weight change.  HENT:  Negative for congestion, mouth sores and sinus pressure.    Eyes:  Negative for visual disturbance.  Respiratory:  Negative for cough and chest tightness.   Gastrointestinal:  Negative for abdominal pain and nausea.  Genitourinary:  Negative for difficulty urinating, frequency and vaginal pain.  Musculoskeletal:  Negative for back pain and gait problem.  Skin:  Negative for pallor and rash.  Neurological:  Negative for dizziness, tremors, weakness, numbness and headaches.  Psychiatric/Behavioral:  Negative for confusion and sleep disturbance.     Objective:  BP (!) 172/90 (BP Location: Left Arm) Comment: pt states she hasn't taken her meds today  Pulse 80   Temp 98.1 F (36.7 C) (Oral)   Ht 5\' 5"  (1.651 m)   Wt 185 lb 6.4 oz (84.1 kg)   SpO2 97%   BMI 30.85 kg/m   BP Readings from Last 3 Encounters:  02/15/22 (!) 172/90  12/11/21 138/82  08/17/21 120/78    Wt Readings from Last 3 Encounters:  02/15/22 185 lb 6.4 oz (84.1 kg)  12/11/21 182 lb 6 oz (82.7 kg)  10/27/21 178 lb (80.7 kg)    Physical Exam Constitutional:      General: She is not in acute distress.    Appearance: She is well-developed. She is obese.  HENT:     Head: Normocephalic.     Right Ear: External ear normal.     Left Ear: External ear normal.     Nose: Nose normal.  Eyes:     General:        Right eye: No discharge.        Left eye: No discharge.     Conjunctiva/sclera: Conjunctivae normal.     Pupils: Pupils are equal, round, and reactive to light.  Neck:     Thyroid: No thyromegaly.     Vascular: No JVD.     Trachea: No tracheal deviation.  Cardiovascular:     Rate and Rhythm: Normal rate and regular rhythm.     Heart sounds: Normal heart sounds.  Pulmonary:     Effort: No respiratory distress.     Breath sounds: No stridor. No wheezing.  Abdominal:     General: Bowel sounds are normal. There is no distension.     Palpations: Abdomen is soft. There is no mass.     Tenderness: There is no abdominal tenderness. There is no guarding or rebound.   Musculoskeletal:        General: No tenderness.     Cervical back: Normal range of motion and neck supple. No rigidity.  Lymphadenopathy:     Cervical: No cervical adenopathy.  Skin:    Findings: No erythema or rash.  Neurological:     Cranial Nerves: No cranial nerve deficit.     Motor: No abnormal muscle tone.     Coordination: Coordination normal.     Deep Tendon Reflexes: Reflexes normal.  Psychiatric:        Behavior: Behavior normal.        Thought Content: Thought content normal.        Judgment: Judgment normal.     Lab Results  Component Value Date   WBC 4.5 02/16/2021   HGB 12.6 02/16/2021   HCT 39.3 02/16/2021   PLT 323.0 02/16/2021   GLUCOSE 86 08/17/2021   CHOL 242 (H) 12/24/2018   TRIG 101 12/24/2018   HDL 62 12/24/2018   LDLDIRECT 132.9 04/30/2007   LDLCALC 160 (H) 12/24/2018   ALT 17 08/17/2021   AST 17 08/17/2021   NA 143 08/17/2021   K 4.6 08/17/2021   CL 109 08/17/2021   CREATININE 1.05 08/17/2021   BUN 15 08/17/2021   CO2 27 08/17/2021   TSH 2.41 03/19/2019   INR 1.0 01/07/2019   HGBA1C 5.6 08/17/2021    MM 3D SCREEN BREAST BILATERAL  Result Date: 02/06/2022 CLINICAL DATA:  Screening. EXAM: DIGITAL SCREENING BILATERAL MAMMOGRAM WITH TOMOSYNTHESIS AND CAD TECHNIQUE: Bilateral screening digital craniocaudal and mediolateral oblique mammograms were obtained. Bilateral screening digital breast tomosynthesis was performed. The images were evaluated with computer-aided detection. COMPARISON:  Previous exam(s). ACR Breast Density Category b: There are scattered areas of fibroglandular density. FINDINGS: There are no findings suspicious for malignancy. IMPRESSION: No mammographic evidence of malignancy. A result letter of this screening mammogram will be mailed directly to the patient. RECOMMENDATION: Screening mammogram in one year. (Code:[id number]) BI-RADS CATEGORY  1: Negative. Electronically Signed   By: Edwin Cap M.D.   On: 02/06/2022 12:56     Assessment & Plan:   Problem List Items Addressed This Visit     Hypokalemia - Primary    Check CMET      Glucose intolerance (impaired glucose tolerance)    Check A1c      Essential hypertension, benign    Pt forgot to take her meds Take your BP meds when you get home      CKD (chronic kidney disease) stage 3, GFR 30-59 ml/min (HCC)    Monitor  GFR Hydrate well         No orders of the defined types were placed in this encounter.     Follow-up: Return in about 6 months (around 08/17/2022) for Wellness Exam.  Walker Kehr, MD

## 2022-02-15 NOTE — Assessment & Plan Note (Signed)
Pt forgot to take her meds Take your BP meds when you get home

## 2022-02-15 NOTE — Patient Instructions (Signed)
Take your BP meds when you get home

## 2022-02-15 NOTE — Assessment & Plan Note (Signed)
Check A1c. 

## 2022-02-22 ENCOUNTER — Encounter: Payer: Self-pay | Admitting: Internal Medicine

## 2022-02-22 ENCOUNTER — Other Ambulatory Visit: Payer: Self-pay | Admitting: Vascular Surgery

## 2022-03-14 ENCOUNTER — Other Ambulatory Visit: Payer: Self-pay | Admitting: Internal Medicine

## 2022-04-30 ENCOUNTER — Telehealth: Payer: Self-pay | Admitting: *Deleted

## 2022-04-30 NOTE — Telephone Encounter (Signed)
MD received [employer] [facility] form to sign he is wanting to know if pt is aware. Called pt on home # and cell no answer and can't leave msg. Will call bck.Marland KitchenJohny Chess

## 2022-05-01 NOTE — Telephone Encounter (Signed)
Called pt again still no answer LMOM RTC.Marland KitchenJohny Carlson

## 2022-05-02 NOTE — Telephone Encounter (Signed)
Called pt again still no answer LMOM RTC.../lmb 

## 2022-05-03 NOTE — Telephone Encounter (Signed)
Spoke with patient and read her the message.  Patient states that she does not want the [employer] form signed.  She has changed her mind.

## 2022-05-04 NOTE — Telephone Encounter (Signed)
Noted. Thx.

## 2022-06-06 ENCOUNTER — Other Ambulatory Visit: Payer: Self-pay

## 2022-06-11 ENCOUNTER — Telehealth: Payer: Self-pay | Admitting: Internal Medicine

## 2022-06-11 MED ORDER — POTASSIUM CHLORIDE ER 10 MEQ PO TBCR: 10.0000 meq | EXTENDED_RELEASE_TABLET | Freq: Every day | ORAL | 0 refills | Status: DC

## 2022-06-11 NOTE — Telephone Encounter (Signed)
Prescription Request  06/11/2022  LOV: 02/15/2022  What is the name of the medication or equipment? Potassium CLER 10 meq tabs  Have you contacted your pharmacy to request a refill? No   Which pharmacy would you like this sent to?  [facility] - [place], [place] - [address] [place] [address] Phone: [phone] Fax: [phone]  Patient notified that their request is being sent to the clinical staff for review and that they should receive a response within 2 business days.   Please advise at Mobile [phone] (mobile)

## 2022-06-11 NOTE — Telephone Encounter (Signed)
Pt is due in June for yearly w/labs sent 90 day until appt.Marland KitchenRaechel Carlson

## 2022-07-04 ENCOUNTER — Telehealth: Payer: Self-pay | Admitting: Internal Medicine

## 2022-07-04 NOTE — Telephone Encounter (Signed)
Contacted Judy Carlson to schedule their annual wellness visit. Appointment made for 07/19/2022.  [facility] Care Guide [facility] AWV TEAM Direct Dial: [phone]

## 2022-07-18 ENCOUNTER — Ambulatory Visit
Admission: RE | Admit: 2022-07-18 | Discharge: 2022-07-18 | Disposition: A | Discharge status: Home / Self Care | Payer: Medicare PPO | Source: Ambulatory Visit | Attending: Acute Care | Admitting: Acute Care

## 2022-07-18 DIAGNOSIS — I700 Atherosclerosis of aorta: Secondary | ICD-10-CM | POA: Diagnosis not present

## 2022-07-18 DIAGNOSIS — R911 Solitary pulmonary nodule: Secondary | ICD-10-CM | POA: Diagnosis not present

## 2022-07-18 DIAGNOSIS — Z87891 Personal history of nicotine dependence: Secondary | ICD-10-CM

## 2022-07-18 DIAGNOSIS — J439 Emphysema, unspecified: Secondary | ICD-10-CM | POA: Diagnosis not present

## 2022-07-23 ENCOUNTER — Other Ambulatory Visit: Payer: Self-pay | Admitting: Acute Care

## 2022-07-23 DIAGNOSIS — Z122 Encounter for screening for malignant neoplasm of respiratory organs: Secondary | ICD-10-CM

## 2022-07-23 DIAGNOSIS — Z87891 Personal history of nicotine dependence: Secondary | ICD-10-CM

## 2022-07-23 DIAGNOSIS — F17210 Nicotine dependence, cigarettes, uncomplicated: Secondary | ICD-10-CM

## 2022-07-25 ENCOUNTER — Ambulatory Visit (INDEPENDENT_AMBULATORY_CARE_PROVIDER_SITE_OTHER): Payer: Medicare PPO

## 2022-07-25 VITALS — Ht 65.0 in | Wt 180.0 lb

## 2022-07-25 DIAGNOSIS — Z0000 Encounter for general adult medical examination without abnormal findings: Secondary | ICD-10-CM

## 2022-07-25 NOTE — Patient Instructions (Addendum)
Judy Carlson , Thank you for taking time to come for your Medicare Wellness Visit. I appreciate your ongoing commitment to your health goals. Please review the following plan we discussed and let me know if I can assist you in the future.   These are the goals we discussed:  Goals      My goal for 2024 is to increase my physical activity and stay independent.     Patient quit smoking: 11/2021     Quit Smoking        This is a list of the screening recommended for you and due dates:  Health Maintenance  Topic Date Due   Flu Shot  10/04/2022   Mammogram  02/06/2023   Medicare Annual Wellness Visit  07/25/2023   DTaP/Tdap/Td vaccine (2 - Td or Tdap) 03/17/2028   Colon Cancer Screening  07/08/2030   Pneumonia Vaccine  Completed   DEXA scan (bone density measurement)  Completed   COVID-19 Vaccine  Completed   Hepatitis C Screening: USPSTF Recommendation to screen - Ages 32-79 yo.  Completed   Zoster (Shingles) Vaccine  Completed   HPV Vaccine  Aged Out    Advanced directives: No; Advance directive discussed with you today. Even though you declined this today please call our office should you change your mind and we can give you the proper paperwork for you to fill out.  Conditions/risks identified: Yes  Next appointment: Follow up in one year for your annual wellness visit.   Preventive Care 46 Years and Older, Female Preventive care refers to lifestyle choices and visits with your health care provider that can promote health and wellness. What does preventive care include? A yearly physical exam. This is also called an annual well check. Dental exams once or twice a year. Routine eye exams. Ask your health care provider how often you should have your eyes checked. Personal lifestyle choices, including: Daily care of your teeth and gums. Regular physical activity. Eating a healthy diet. Avoiding tobacco and drug use. Limiting alcohol use. Practicing safe sex. Taking low-dose  aspirin every day. Taking vitamin and mineral supplements as recommended by your health care provider. What happens during an annual well check? The services and screenings done by your health care provider during your annual well check will depend on your age, overall health, lifestyle risk factors, and family history of disease. Counseling  Your health care provider may ask you questions about your: Alcohol use. Tobacco use. Drug use. Emotional well-being. Home and relationship well-being. Sexual activity. Eating habits. History of falls. Memory and ability to understand (cognition). Work and work Astronomer. Reproductive health. Screening  You may have the following tests or measurements: Height, weight, and BMI. Blood pressure. Lipid and cholesterol levels. These may be checked every 5 years, or more frequently if you are over 20 years old. Skin check. Lung cancer screening. You may have this screening every year starting at age 85 if you have a 30-pack-year history of smoking and currently smoke or have quit within the past 15 years. Fecal occult blood test (FOBT) of the stool. You may have this test every year starting at age 64. Flexible sigmoidoscopy or colonoscopy. You may have a sigmoidoscopy every 5 years or a colonoscopy every 10 years starting at age 49. Hepatitis C blood test. Hepatitis B blood test. Sexually transmitted disease (STD) testing. Diabetes screening. This is done by checking your blood sugar (glucose) after you have not eaten for a while (fasting). You may have this  done every 1-3 years. Bone density scan. This is done to screen for osteoporosis. You may have this done starting at age 36. Mammogram. This may be done every 1-2 years. Talk to your health care provider about how often you should have regular mammograms. Talk with your health care provider about your test results, treatment options, and if necessary, the need for more tests. Vaccines  Your  health care provider may recommend certain vaccines, such as: Influenza vaccine. This is recommended every year. Tetanus, diphtheria, and acellular pertussis (Tdap, Td) vaccine. You may need a Td booster every 10 years. Zoster vaccine. You may need this after age 64. Pneumococcal 13-valent conjugate (PCV13) vaccine. One dose is recommended after age 75. Pneumococcal polysaccharide (PPSV23) vaccine. One dose is recommended after age 56. Talk to your health care provider about which screenings and vaccines you need and how often you need them. This information is not intended to replace advice given to you by your health care provider. Make sure you discuss any questions you have with your health care provider. Document Released: 03/18/2015 Document Revised: 11/09/2015 Document Reviewed: 12/21/2014 Elsevier Interactive Patient Education  2017 [employer].  Fall Prevention in the Home Falls can cause injuries. They can happen to people of all ages. There are many things you can do to make your home safe and to help prevent falls. What can I do on the outside of my home? Regularly fix the edges of walkways and driveways and fix any cracks. Remove anything that might make you trip as you walk through a door, such as a raised step or threshold. Trim any bushes or trees on the path to your home. Use bright outdoor lighting. Clear any walking paths of anything that might make someone trip, such as rocks or tools. Regularly check to see if handrails are loose or broken. Make sure that both sides of any steps have handrails. Any raised decks and porches should have guardrails on the edges. Have any leaves, snow, or ice cleared regularly. Use sand or salt on walking paths during winter. Clean up any spills in your garage right away. This includes oil or grease spills. What can I do in the bathroom? Use night lights. Install grab bars by the toilet and in the tub and shower. Do not use towel bars as  grab bars. Use non-skid mats or decals in the tub or shower. If you need to sit down in the shower, use a plastic, non-slip stool. Keep the floor dry. Clean up any water that spills on the floor as soon as it happens. Remove soap buildup in the tub or shower regularly. Attach bath mats securely with double-sided non-slip rug tape. Do not have throw rugs and other things on the floor that can make you trip. What can I do in the bedroom? Use night lights. Make sure that you have a light by your bed that is easy to reach. Do not use any sheets or blankets that are too big for your bed. They should not hang down onto the floor. Have a firm chair that has side arms. You can use this for support while you get dressed. Do not have throw rugs and other things on the floor that can make you trip. What can I do in the kitchen? Clean up any spills right away. Avoid walking on wet floors. Keep items that you use a lot in easy-to-reach places. If you need to reach something above you, use a strong step stool that  has a grab bar. Keep electrical cords out of the way. Do not use floor polish or wax that makes floors slippery. If you must use wax, use non-skid floor wax. Do not have throw rugs and other things on the floor that can make you trip. What can I do with my stairs? Do not leave any items on the stairs. Make sure that there are handrails on both sides of the stairs and use them. Fix handrails that are broken or loose. Make sure that handrails are as long as the stairways. Check any carpeting to make sure that it is firmly attached to the stairs. Fix any carpet that is loose or worn. Avoid having throw rugs at the top or bottom of the stairs. If you do have throw rugs, attach them to the floor with carpet tape. Make sure that you have a light switch at the top of the stairs and the bottom of the stairs. If you do not have them, ask someone to add them for you. What else can I do to help prevent  falls? Wear shoes that: Do not have high heels. Have rubber bottoms. Are comfortable and fit you well. Are closed at the toe. Do not wear sandals. If you use a stepladder: Make sure that it is fully opened. Do not climb a closed stepladder. Make sure that both sides of the stepladder are locked into place. Ask someone to hold it for you, if possible. Clearly mark and make sure that you can see: Any grab bars or handrails. First and last steps. Where the edge of each step is. Use tools that help you move around (mobility aids) if they are needed. These include: Canes. Walkers. Scooters. Crutches. Turn on the lights when you go into a dark area. Replace any light bulbs as soon as they burn out. Set up your furniture so you have a clear path. Avoid moving your furniture around. If any of your floors are uneven, fix them. If there are any pets around you, be aware of where they are. Review your medicines with your doctor. Some medicines can make you feel dizzy. This can increase your chance of falling. Ask your doctor what other things that you can do to help prevent falls. This information is not intended to replace advice given to you by your health care provider. Make sure you discuss any questions you have with your health care provider. Document Released: 12/16/2008 Document Revised: 07/28/2015 Document Reviewed: 03/26/2014 Elsevier Interactive Patient Education  2017 [employer].
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
## 2022-07-26 DIAGNOSIS — Z961 Presence of intraocular lens: Secondary | ICD-10-CM | POA: Diagnosis not present

## 2022-07-26 DIAGNOSIS — H34231 Retinal artery branch occlusion, right eye: Secondary | ICD-10-CM | POA: Diagnosis not present

## 2022-07-26 DIAGNOSIS — H2512 Age-related nuclear cataract, left eye: Secondary | ICD-10-CM | POA: Diagnosis not present

## 2022-08-20 ENCOUNTER — Ambulatory Visit (INDEPENDENT_AMBULATORY_CARE_PROVIDER_SITE_OTHER): Payer: Medicare PPO | Admitting: Internal Medicine

## 2022-08-20 ENCOUNTER — Encounter: Payer: Self-pay | Admitting: Internal Medicine

## 2022-08-20 VITALS — BP 160/92 | HR 75 | Temp 97.9°F | Ht 65.0 in | Wt 182.0 lb

## 2022-08-20 DIAGNOSIS — I10 Essential (primary) hypertension: Secondary | ICD-10-CM

## 2022-08-20 DIAGNOSIS — N1831 Chronic kidney disease, stage 3a: Secondary | ICD-10-CM | POA: Diagnosis not present

## 2022-08-20 DIAGNOSIS — J029 Acute pharyngitis, unspecified: Secondary | ICD-10-CM

## 2022-08-20 DIAGNOSIS — E785 Hyperlipidemia, unspecified: Secondary | ICD-10-CM | POA: Diagnosis not present

## 2022-08-20 DIAGNOSIS — G4700 Insomnia, unspecified: Secondary | ICD-10-CM | POA: Diagnosis not present

## 2022-08-20 MED ORDER — AZITHROMYCIN 250 MG PO TABS: ORAL_TABLET | ORAL | 0 refills | Status: DC

## 2022-08-20 MED ORDER — AMLODIPINE BESYLATE-VALSARTAN 5-160 MG PO TABS: 1.0000 | ORAL_TABLET | Freq: Every day | ORAL | 3 refills | Status: DC

## 2022-08-20 MED ORDER — ESCITALOPRAM OXALATE 5 MG PO TABS
5.0000 mg | ORAL_TABLET | Freq: Every day | ORAL | 1 refills | Status: DC
Start: 2022-08-20 — End: 2023-02-09

## 2022-08-20 NOTE — Progress Notes (Signed)
Subjective:  Patient ID: Judy Carlson, female    DOB: 1950-07-14  Age: 72 y.o. MRN: [id number]  CC: Follow-up (6 MNTH F/U, X4 DAYS RT SIDE OF THROAT SORE AND RT EAR PAIN )   HPI ARACELIE ADDIS presents for HTN, hypokalemia, stress C/o ST as above  Outpatient Medications Prior to Visit  Medication Sig Dispense Refill   acetaminophen (TYLENOL) 650 MG CR tablet Take 1,300 mg by mouth as needed for pain.      Cholecalciferol (VITAMIN D3) 50 MCG (2000 UT) capsule TAKE 1 CAPSULE (2,000 UNITS TOTAL) BY MOUTH DAILY. 90 capsule 3   clopidogrel (PLAVIX) 75 MG tablet TAKE 1 TABLET EVERY DAY 90 tablet 3   ezetimibe (ZETIA) 10 MG tablet TAKE 1 TABLET EVERY DAY 90 tablet 3   furosemide (LASIX) 40 MG tablet TAKE 1/2 TABLET EVERY DAY AS NEEDED 45 tablet 1   metoprolol succinate (TOPROL-XL) 100 MG 24 hr tablet TAKE 1 TABLET DAILY WITH OR IMMEDIATELY FOLLOWING A MEAL. 90 tablet 3   Multiple Vitamin (MULTIVITAMIN) tablet Take 1 tablet by mouth daily.     Olopatadine HCl (PATADAY OP) Place 1 drop into both eyes daily as needed (For dry eyes).     potassium chloride (KLOR-CON) 10 MEQ tablet Take 1 tablet (10 mEq total) by mouth daily. Annual appt due in June w/labs must see provider for future refills 90 tablet 0   amLODipine (NORVASC) 5 MG tablet TAKE 1 TABLET EVERY DAY 90 tablet 3   COVID-19 mRNA vaccine 2023-2024 (COMIRNATY) SUSP injection Inject into the muscle. 0.3 mL 0   No facility-administered medications prior to visit.    ROS: Review of Systems  Constitutional:  Negative for activity change, appetite change, chills, fatigue and unexpected weight change.  HENT:  Positive for congestion, ear pain and sore throat. Negative for mouth sores and sinus pressure.   Eyes:  Negative for visual disturbance.  Respiratory:  Negative for cough and chest tightness.   Gastrointestinal:  Negative for abdominal pain and nausea.  Genitourinary:  Negative for difficulty urinating, frequency and vaginal  pain.  Musculoskeletal:  Negative for back pain and gait problem.  Skin:  Negative for pallor and rash.  Neurological:  Negative for dizziness, tremors, weakness, numbness and headaches.  Psychiatric/Behavioral:  Positive for sleep disturbance. Negative for confusion and suicidal ideas. The patient is nervous/anxious.     Objective:  BP (!) 160/92   Pulse 75   Temp 97.9 F (36.6 C) (Oral)   Ht 5\' 5"  (1.651 m)   Wt 182 lb (82.6 kg)   SpO2 97%   BMI 30.29 kg/m   BP Readings from Last 3 Encounters:  08/20/22 (!) 160/92  02/15/22 (!) 172/90  12/11/21 138/82    Wt Readings from Last 3 Encounters:  08/20/22 182 lb (82.6 kg)  07/25/22 180 lb (81.6 kg)  02/15/22 185 lb 6.4 oz (84.1 kg)    Physical Exam Constitutional:      General: She is not in acute distress.    Appearance: She is well-developed.  HENT:     Head: Normocephalic.     Right Ear: External ear normal.     Left Ear: External ear normal.     Nose: Nose normal.  Eyes:     General:        Right eye: No discharge.        Left eye: No discharge.     Conjunctiva/sclera: Conjunctivae normal.     Pupils: Pupils are equal,  round, and reactive to light.  Neck:     Thyroid: No thyromegaly.     Vascular: No JVD.     Trachea: No tracheal deviation.  Cardiovascular:     Rate and Rhythm: Normal rate and regular rhythm.     Heart sounds: Normal heart sounds.  Pulmonary:     Effort: No respiratory distress.     Breath sounds: No stridor. No wheezing.  Abdominal:     General: Bowel sounds are normal. There is no distension.     Palpations: Abdomen is soft. There is no mass.     Tenderness: There is no abdominal tenderness. There is no guarding or rebound.  Musculoskeletal:        General: No tenderness.     Cervical back: Normal range of motion and neck supple. No rigidity.  Lymphadenopathy:     Cervical: No cervical adenopathy.  Skin:    Findings: No erythema or rash.  Neurological:     Mental Status: She is  oriented to person, place, and time.     Cranial Nerves: No cranial nerve deficit.     Motor: No abnormal muscle tone.     Coordination: Coordination normal.     Deep Tendon Reflexes: Reflexes normal.  Psychiatric:        Behavior: Behavior normal.        Thought Content: Thought content normal.        Judgment: Judgment normal.     Lab Results  Component Value Date   WBC 4.5 02/16/2021   HGB 12.6 02/16/2021   HCT 39.3 02/16/2021   PLT 323.0 02/16/2021   GLUCOSE 105 (H) 02/15/2022   CHOL 242 (H) 12/24/2018   TRIG 101 12/24/2018   HDL 62 12/24/2018   LDLDIRECT 132.9 04/30/2007   LDLCALC 160 (H) 12/24/2018   ALT 22 02/15/2022   AST 19 02/15/2022   NA 142 02/15/2022   K 3.9 02/15/2022   CL 108 02/15/2022   CREATININE 1.02 02/15/2022   BUN 14 02/15/2022   CO2 27 02/15/2022   TSH 2.41 03/19/2019   INR 1.0 01/07/2019   HGBA1C 6.1 02/15/2022    CT CHEST LCS NODULE F/U LOW DOSE WO CONTRAST  Result Date: 07/22/2022 CLINICAL DATA:  Lung cancer screening. Current asymptomatic smoker with 49 pack-year history. Follow-up lung nodule. EXAM: CT CHEST WITHOUT CONTRAST FOR LUNG CANCER SCREENING NODULE FOLLOW-UP TECHNIQUE: Multidetector CT imaging of the chest was performed following the standard protocol without IV contrast. RADIATION DOSE REDUCTION: This exam was performed according to the departmental dose-optimization program which includes automated exposure control, adjustment of the mA and/or kV according to patient size and/or use of iterative reconstruction technique. COMPARISON:  01/10/2022 FINDINGS: Cardiovascular: Heart size appears within normal limits. Aortic atherosclerosis and coronary artery calcifications. No pericardial effusion. Mediastinum/Nodes: No enlarged mediastinal, hilar, or axillary lymph nodes. Thyroid gland, trachea, and esophagus demonstrate no significant findings. Lungs/Pleura: Centrilobular emphysema. No pleural effusion, airspace consolidation, atelectasis or  pneumothorax. Small lung nodules are again seen. These appear stable from the previous exam. The largest is in the right middle lobe with a mean derived diameter of 6.5 mm. No suspicious lung nodules identified at this time. Upper Abdomen: No acute abnormality. Nonobstructing stone within upper pole of right kidney measures 5 mm, image 59/2. Small low-density liver lesions are again seen and are too small to reliably characterize. Musculoskeletal: No chest wall mass or suspicious bone lesions identified. IMPRESSION: 1. Lung-RADS 2, benign appearance or behavior. Continue annual screening with  low-dose chest CT without contrast in 12 months. 2. Coronary artery calcifications. 3. Aortic Atherosclerosis (ICD10-I70.0) and Emphysema (ICD10-J43.9). Electronically Signed   By: Signa Kell M.D.   On: 07/22/2022 12:46   Assessment & Plan:   Problem List Items Addressed This Visit     Essential hypertension, benign - Primary    Not well controlled D/c Amlodipine Start Exforge every day Cont other Rx      Relevant Medications   amLODipine-valsartan (EXFORGE) 5-160 MG tablet   Dyslipidemia    On Zetia      CKD (chronic kidney disease) stage 3, GFR 30-59 ml/min (HCC)    Monitor GFR Hydrate well      Insomnia    Occasional Use Zolpidem infrequently - d/c (ineffective)  Potential benefits of a long term benzodiazepines  use as well as potential risks  and complications were explained to the patient and were aknowledged. Taking OTC sleep med      Sore throat    New Z pac         Meds ordered this encounter  Medications   amLODipine-valsartan (EXFORGE) 5-160 MG tablet    Sig: Take 1 tablet by mouth daily.    Dispense:  90 tablet    Refill:  3   azithromycin (ZITHROMAX Z-PAK) 250 MG tablet    Sig: As directed    Dispense:  6 tablet    Refill:  0   escitalopram (LEXAPRO) 5 MG tablet    Sig: Take 1 tablet (5 mg total) by mouth daily.    Dispense:  90 tablet    Refill:  1       Follow-up: Return in about 2 months (around 10/20/2022) for a follow-up visit.  Sonda Primes, MD

## 2022-08-20 NOTE — Assessment & Plan Note (Addendum)
New. Zpac 

## 2022-08-20 NOTE — Assessment & Plan Note (Addendum)
Occasional Use Zolpidem infrequently - d/c (ineffective)  Potential benefits of a long term benzodiazepines  use as well as potential risks  and complications were explained to the patient and were aknowledged. Taking OTC sleep med

## 2022-08-20 NOTE — Assessment & Plan Note (Signed)
Not well controlled D/c Amlodipine Start Exforge every day Cont other Rx

## 2022-08-27 NOTE — Assessment & Plan Note (Signed)
Monitor GFR Hydrate well 

## 2022-08-27 NOTE — Assessment & Plan Note (Signed)
On Zetia  

## 2022-08-29 ENCOUNTER — Telehealth: Payer: Self-pay | Admitting: *Deleted

## 2022-08-29 NOTE — Telephone Encounter (Signed)
Rec;d 2 scripts today. Need to verify if pt still taking ecitalopram Taking Aithromycin and Ecitalopram has a drug interaction cause Q-T prolong-gation. Inform Rob ok to dispense Ecitalopram pt was told to hold med until she finish w/ Z-pac.Marland KitchenRaechel Chute

## 2022-08-30 NOTE — Telephone Encounter (Signed)
Noted. Thx.

## 2022-09-27 ENCOUNTER — Other Ambulatory Visit: Payer: Self-pay | Admitting: Internal Medicine

## 2022-10-03 ENCOUNTER — Encounter (INDEPENDENT_AMBULATORY_CARE_PROVIDER_SITE_OTHER): Payer: Self-pay

## 2022-10-15 ENCOUNTER — Encounter (HOSPITAL_COMMUNITY): Payer: Self-pay

## 2022-10-15 ENCOUNTER — Ambulatory Visit (HOSPITAL_COMMUNITY)
Admission: EM | Admit: 2022-10-15 | Discharge: 2022-10-15 | Disposition: A | Discharge status: Home / Self Care | Payer: Medicare PPO | Attending: Internal Medicine | Admitting: Internal Medicine

## 2022-10-15 ENCOUNTER — Ambulatory Visit (INDEPENDENT_AMBULATORY_CARE_PROVIDER_SITE_OTHER): Payer: Medicare PPO

## 2022-10-15 DIAGNOSIS — M19071 Primary osteoarthritis, right ankle and foot: Secondary | ICD-10-CM | POA: Diagnosis not present

## 2022-10-15 DIAGNOSIS — S92511A Displaced fracture of proximal phalanx of right lesser toe(s), initial encounter for closed fracture: Secondary | ICD-10-CM | POA: Diagnosis not present

## 2022-10-15 DIAGNOSIS — M79674 Pain in right toe(s): Secondary | ICD-10-CM | POA: Diagnosis not present
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
## 2022-10-15 NOTE — ED Triage Notes (Signed)
Right Toe Injury x2 days. Pain in the 4th toe. Patient states she hit the toe against a chair. Reduced ROM but no noticed swelling according to the patient.

## 2022-10-15 NOTE — Discharge Instructions (Signed)
You have fractured your fourth toe.  Post-op shoe applied, wear this until follow-up appointment with foot doctor.  Rest, ice, elevate, and compress the injury to reduce swelling and inflammation.   Tylenol as needed for pain. Keep toes buddy taped.  Schedule an appointment with the orthopedic provider listed on your paperwork for follow-up in the next 3-5 days.  Return if you experience worsening pain, numbness, tingling, skin color changes, or any other concerning symptoms. If symptoms are severe, please go to the ER. I hope you feel better!!

## 2022-10-17 ENCOUNTER — Telehealth: Payer: Self-pay

## 2022-10-17 NOTE — Telephone Encounter (Signed)
Just FYI - patient called and left a message on 8/13 - I called and spoke with her this morning. She has an appointment on 8/21 for displaced toe fracture - UC did xrays and put her in a shoe. She is concerned that the appointment is not soon enough. Advised to ice, elevated, rest and wear the post op shoe until she is seen, that there is not much more she can do until then. She sees her PCP on 8/20. Patient voiced understanding thanks

## 2022-10-23 ENCOUNTER — Ambulatory Visit (INDEPENDENT_AMBULATORY_CARE_PROVIDER_SITE_OTHER): Payer: Medicare PPO | Admitting: Internal Medicine

## 2022-10-23 ENCOUNTER — Encounter: Payer: Self-pay | Admitting: Internal Medicine

## 2022-10-23 VITALS — BP 130/80 | HR 77 | Temp 98.6°F | Ht 66.0 in | Wt 182.0 lb

## 2022-10-23 DIAGNOSIS — S92911D Unspecified fracture of right toe(s), subsequent encounter for fracture with routine healing: Secondary | ICD-10-CM | POA: Diagnosis not present

## 2022-10-23 DIAGNOSIS — I10 Essential (primary) hypertension: Secondary | ICD-10-CM | POA: Diagnosis not present

## 2022-10-23 DIAGNOSIS — E559 Vitamin D deficiency, unspecified: Secondary | ICD-10-CM

## 2022-10-23 DIAGNOSIS — F419 Anxiety disorder, unspecified: Secondary | ICD-10-CM | POA: Diagnosis not present

## 2022-10-23 DIAGNOSIS — F4321 Adjustment disorder with depressed mood: Secondary | ICD-10-CM | POA: Diagnosis not present

## 2022-10-23 DIAGNOSIS — S92919A Unspecified fracture of unspecified toe(s), initial encounter for closed fracture: Secondary | ICD-10-CM | POA: Insufficient documentation

## 2022-10-23 DIAGNOSIS — F32A Depression, unspecified: Secondary | ICD-10-CM

## 2022-10-23 NOTE — Assessment & Plan Note (Addendum)
Discussed  Judy Carlson died in July 2024 - grieving Counseling is suggested

## 2022-10-23 NOTE — Assessment & Plan Note (Signed)
Not well controlled D/c Amlodipine Start Exforge every day Cont other Rx

## 2022-10-23 NOTE — Progress Notes (Signed)
Subjective:  Patient ID: Judy Carlson, female    DOB: 01-18-1951  Age: 72 y.o. MRN: [id number]  CC: Follow-up (F/U on BP meds and Anxiety/stress medication given in June/)   HPI Judy Carlson presents for grieving, anxiety and depression Brett Canales died in 10-08-22- grieving F/u HTN  Outpatient Medications Prior to Visit  Medication Sig Dispense Refill   acetaminophen (TYLENOL) 650 MG CR tablet Take 1,300 mg by mouth as needed for pain.      amLODipine-valsartan (EXFORGE) 5-160 MG tablet Take 1 tablet by mouth daily. 90 tablet 3   Cholecalciferol (VITAMIN D3) 50 MCG (2000 UT) capsule TAKE 1 CAPSULE (2,000 UNITS TOTAL) BY MOUTH DAILY. 90 capsule 3   clopidogrel (PLAVIX) 75 MG tablet TAKE 1 TABLET EVERY DAY 90 tablet 3   escitalopram (LEXAPRO) 5 MG tablet Take 1 tablet (5 mg total) by mouth daily. 90 tablet 1   ezetimibe (ZETIA) 10 MG tablet TAKE 1 TABLET EVERY DAY 90 tablet 3   furosemide (LASIX) 40 MG tablet TAKE 1/2 TABLET EVERY DAY AS NEEDED 45 tablet 1   metoprolol succinate (TOPROL-XL) 100 MG 24 hr tablet TAKE 1 TABLET DAILY WITH OR IMMEDIATELY FOLLOWING A MEAL. 90 tablet 3   Multiple Vitamin (MULTIVITAMIN) tablet Take 1 tablet by mouth daily.     Olopatadine HCl (PATADAY OP) Place 1 drop into both eyes daily as needed (For dry eyes).     potassium chloride (KLOR-CON) 10 MEQ tablet Take 1 tablet (10 mEq total) by mouth daily. 90 tablet 0   azithromycin (ZITHROMAX Z-PAK) 250 MG tablet As directed 6 tablet 0   No facility-administered medications prior to visit.    ROS: Review of Systems  Constitutional:  Negative for activity change, appetite change, chills, fatigue and unexpected weight change.  HENT:  Negative for congestion, mouth sores and sinus pressure.   Eyes:  Negative for visual disturbance.  Respiratory:  Negative for cough and chest tightness.   Gastrointestinal:  Negative for abdominal pain and nausea.  Genitourinary:  Negative for difficulty urinating,  frequency and vaginal pain.  Musculoskeletal:  Negative for back pain and gait problem.  Skin:  Negative for pallor and rash.  Neurological:  Negative for dizziness, tremors, weakness, numbness and headaches.  Psychiatric/Behavioral:  Negative for confusion, sleep disturbance and suicidal ideas. The patient is nervous/anxious.     Objective:  BP 130/80 (BP Location: Right Arm, Patient Position: Sitting, Cuff Size: Large)   Pulse 77   Temp 98.6 F (37 C) (Oral)   Ht 5\' 6"  (1.676 m)   Wt 182 lb (82.6 kg)   SpO2 98%   BMI 29.38 kg/m   BP Readings from Last 3 Encounters:  10/23/22 130/80  10/15/22 (!) 152/88  08/20/22 (!) 160/92    Wt Readings from Last 3 Encounters:  10/23/22 182 lb (82.6 kg)  10/15/22 180 lb (81.6 kg)  08/20/22 182 lb (82.6 kg)    Physical Exam Constitutional:      General: She is not in acute distress.    Appearance: She is well-developed.  HENT:     Head: Normocephalic.     Right Ear: External ear normal.     Left Ear: External ear normal.     Nose: Nose normal.  Eyes:     General:        Right eye: No discharge.        Left eye: No discharge.     Conjunctiva/sclera: Conjunctivae normal.  Pupils: Pupils are equal, round, and reactive to light.  Neck:     Thyroid: No thyromegaly.     Vascular: No JVD.     Trachea: No tracheal deviation.  Cardiovascular:     Rate and Rhythm: Normal rate and regular rhythm.     Heart sounds: Normal heart sounds.  Pulmonary:     Effort: No respiratory distress.     Breath sounds: No stridor. No wheezing.  Abdominal:     General: Bowel sounds are normal. There is no distension.     Palpations: Abdomen is soft. There is no mass.     Tenderness: There is no abdominal tenderness. There is no guarding or rebound.  Musculoskeletal:        General: No tenderness.     Cervical back: Normal range of motion and neck supple. No rigidity.  Lymphadenopathy:     Cervical: No cervical adenopathy.  Skin:    Findings:  No erythema or rash.  Neurological:     Cranial Nerves: No cranial nerve deficit.     Motor: No abnormal muscle tone.     Coordination: Coordination normal.     Deep Tendon Reflexes: Reflexes normal.  Psychiatric:        Behavior: Behavior normal.        Thought Content: Thought content normal.        Judgment: Judgment normal.   tearful R foot in a boot - broken toes   Lab Results  Component Value Date   WBC 4.5 02/16/2021   HGB 12.6 02/16/2021   HCT 39.3 02/16/2021   PLT 323.0 02/16/2021   GLUCOSE 105 (H) 02/15/2022   CHOL 242 (H) 12/24/2018   TRIG 101 12/24/2018   HDL 62 12/24/2018   LDLDIRECT 132.9 04/30/2007   LDLCALC 160 (H) 12/24/2018   ALT 22 02/15/2022   AST 19 02/15/2022   NA 142 02/15/2022   K 3.9 02/15/2022   CL 108 02/15/2022   CREATININE 1.02 02/15/2022   BUN 14 02/15/2022   CO2 27 02/15/2022   TSH 2.41 03/19/2019   INR 1.0 01/07/2019   HGBA1C 6.1 02/15/2022    DG Foot Complete Right  Result Date: 10/15/2022 CLINICAL DATA:  Fourth toe injury. EXAM: RIGHT FOOT COMPLETE - 3+ VIEW COMPARISON:  None Available. FINDINGS: The bones appear mildly demineralized. There is an acute extra-articular fracture of the midshaft of the 4th proximal phalanx, best seen on the PA view. This is minimally displaced. No other evidence of acute fracture or dislocation. Mild to moderate degenerative changes at the 1st metatarsophalangeal joint. Type 1 accessory navicular noted. No foreign body or soft tissue emphysema. IMPRESSION: Minimally displaced extra-articular fracture of the 4th proximal phalanx. Electronically Signed   By: Carey Bullocks M.D.   On: 10/15/2022 14:45    Assessment & Plan:   Problem List Items Addressed This Visit     Essential hypertension, benign    Not well controlled D/c Amlodipine Start Exforge every day Cont other Rx      Vitamin D deficiency    On Vit D      Anxiety and depression    Discussed  Brett Canales died in 10/06/2022-  grieving Counseling is suggested        Grief - Primary    Steve died in 10/06/22- grieving      Toe fracture      No orders of the defined types were placed in this encounter.     Follow-up: Return  in about 3 months (around 01/23/2023) for a follow-up visit.  Sonda Primes, MD

## 2022-10-23 NOTE — Assessment & Plan Note (Signed)
On Vit D 

## 2022-10-23 NOTE — Assessment & Plan Note (Signed)
Judy Carlson died in July 2024 - grieving

## 2022-10-24 ENCOUNTER — Encounter: Payer: Self-pay | Admitting: Podiatry

## 2022-10-24 ENCOUNTER — Ambulatory Visit (INDEPENDENT_AMBULATORY_CARE_PROVIDER_SITE_OTHER): Payer: Medicare PPO | Admitting: Podiatry

## 2022-10-24 DIAGNOSIS — S92504A Nondisplaced unspecified fracture of right lesser toe(s), initial encounter for closed fracture: Secondary | ICD-10-CM | POA: Diagnosis not present
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
## 2022-10-29 ENCOUNTER — Other Ambulatory Visit: Payer: Self-pay | Admitting: *Deleted

## 2022-10-29 NOTE — Progress Notes (Signed)
Sent noted to patient.Marland KitchenRaechel Chute

## 2022-11-10 ENCOUNTER — Other Ambulatory Visit: Payer: Self-pay | Admitting: Internal Medicine

## 2022-11-20 ENCOUNTER — Encounter: Payer: Self-pay | Admitting: Internal Medicine

## 2022-12-12 ENCOUNTER — Encounter: Payer: Self-pay | Admitting: Internal Medicine

## 2022-12-12 ENCOUNTER — Ambulatory Visit (INDEPENDENT_AMBULATORY_CARE_PROVIDER_SITE_OTHER): Payer: Medicare PPO | Admitting: Internal Medicine

## 2022-12-12 VITALS — BP 118/68 | HR 89 | Temp 98.3°F | Ht 66.0 in | Wt 183.0 lb

## 2022-12-12 DIAGNOSIS — S92911D Unspecified fracture of right toe(s), subsequent encounter for fracture with routine healing: Secondary | ICD-10-CM | POA: Diagnosis not present

## 2022-12-12 DIAGNOSIS — N1831 Chronic kidney disease, stage 3a: Secondary | ICD-10-CM | POA: Diagnosis not present

## 2022-12-12 DIAGNOSIS — R7302 Impaired glucose tolerance (oral): Secondary | ICD-10-CM | POA: Diagnosis not present

## 2022-12-12 DIAGNOSIS — Z0000 Encounter for general adult medical examination without abnormal findings: Secondary | ICD-10-CM

## 2022-12-12 DIAGNOSIS — E559 Vitamin D deficiency, unspecified: Secondary | ICD-10-CM | POA: Diagnosis not present

## 2022-12-12 DIAGNOSIS — I10 Essential (primary) hypertension: Secondary | ICD-10-CM

## 2022-12-12 NOTE — Assessment & Plan Note (Signed)
Healing well Nl shoes next week

## 2022-12-12 NOTE — Assessment & Plan Note (Signed)
Well controlled  On Exforge every day Cont other Rx

## 2022-12-12 NOTE — Assessment & Plan Note (Signed)
On Vit D 

## 2022-12-12 NOTE — Progress Notes (Signed)
Subjective:  Patient ID: Judy Carlson, female    DOB: 1950/04/03  Age: 72 y.o. MRN: [id number]  CC: Medication Reaction (Pt states she missed 3 days of taking her BP medication and when she did start back taking it she had a reaction the 1st day but none since taking them regularly again.)   HPI Judy Carlson for HTN - doing well now  Outpatient Medications Prior to Visit  Medication Sig Dispense Refill   acetaminophen (TYLENOL) 650 MG CR tablet Take 1,300 mg by mouth as needed for pain.      amLODipine-valsartan (EXFORGE) 5-160 MG tablet Take 1 tablet by mouth daily. 90 tablet 3   Cholecalciferol (VITAMIN D3) 50 MCG (2000 UT) capsule TAKE 1 CAPSULE (2,000 UNITS TOTAL) BY MOUTH DAILY. 90 capsule 3   clopidogrel (PLAVIX) 75 MG tablet TAKE 1 TABLET EVERY DAY 90 tablet 3   escitalopram (LEXAPRO) 5 MG tablet Take 1 tablet (5 mg total) by mouth daily. 90 tablet 1   ezetimibe (ZETIA) 10 MG tablet TAKE 1 TABLET EVERY DAY 90 tablet 3   furosemide (LASIX) 40 MG tablet TAKE 1/2 TABLET EVERY DAY AS NEEDED 45 tablet 3   metoprolol succinate (TOPROL-XL) 100 MG 24 hr tablet TAKE 1 TABLET DAILY WITH OR IMMEDIATELY FOLLOWING A MEAL. 90 tablet 3   Multiple Vitamin (MULTIVITAMIN) tablet Take 1 tablet by mouth daily.     Olopatadine HCl (PATADAY OP) Place 1 drop into both eyes daily as needed (For dry eyes).     potassium chloride (KLOR-CON) 10 MEQ tablet Take 1 tablet (10 mEq total) by mouth daily. 90 tablet 0   No facility-administered medications prior to visit.    ROS: Review of Systems  Constitutional:  Negative for activity change, appetite change, chills, fatigue and unexpected weight change.  HENT:  Negative for congestion, mouth sores and sinus pressure.   Eyes:  Negative for visual disturbance.  Respiratory:  Negative for cough and chest tightness.   Gastrointestinal:  Negative for abdominal pain and nausea.  Genitourinary:  Negative for difficulty urinating, frequency and vaginal  pain.  Musculoskeletal:  Negative for arthralgias, back pain and gait problem.  Skin:  Negative for pallor and rash.  Neurological:  Negative for dizziness, tremors, weakness, numbness and headaches.  Psychiatric/Behavioral:  Negative for confusion, sleep disturbance and suicidal ideas. The patient is not nervous/anxious.     Objective:  BP 118/68 (BP Location: Left Arm, Patient Position: Sitting, Cuff Size: Normal)   Pulse 89   Temp 98.3 F (36.8 C) (Oral)   Ht 5\' 6"  (1.676 m)   Wt 183 lb (83 kg)   SpO2 94%   BMI 29.54 kg/m   BP Readings from Last 3 Encounters:  12/12/22 118/68  10/23/22 130/80  10/15/22 (!) 152/88    Wt Readings from Last 3 Encounters:  12/12/22 183 lb (83 kg)  10/23/22 182 lb (82.6 kg)  10/15/22 180 lb (81.6 kg)    Physical Exam Constitutional:      General: She is not in acute distress.    Appearance: She is well-developed.  HENT:     Head: Normocephalic.     Right Ear: External ear normal.     Left Ear: External ear normal.     Nose: Nose normal.  Eyes:     General:        Right eye: No discharge.        Left eye: No discharge.     Conjunctiva/sclera: Conjunctivae normal.  Pupils: Pupils are equal, round, and reactive to light.  Neck:     Thyroid: No thyromegaly.     Vascular: No JVD.     Trachea: No tracheal deviation.  Cardiovascular:     Rate and Rhythm: Normal rate and regular rhythm.     Heart sounds: Normal heart sounds.  Pulmonary:     Effort: No respiratory distress.     Breath sounds: No stridor. No wheezing.  Abdominal:     General: Bowel sounds are normal. There is no distension.     Palpations: Abdomen is soft. There is no mass.     Tenderness: There is no abdominal tenderness. There is no guarding or rebound.  Musculoskeletal:        General: No tenderness.     Cervical back: Normal range of motion and neck supple. No rigidity.  Lymphadenopathy:     Cervical: No cervical adenopathy.  Skin:    Findings: No erythema  or rash.  Neurological:     Mental Status: She is oriented to person, place, and time.     Cranial Nerves: No cranial nerve deficit.     Motor: No abnormal muscle tone.     Coordination: Coordination normal.     Deep Tendon Reflexes: Reflexes normal.  Psychiatric:        Behavior: Behavior normal.        Thought Content: Thought content normal.        Judgment: Judgment normal.     Lab Results  Component Value Date   WBC 4.5 02/16/2021   HGB 12.6 02/16/2021   HCT 39.3 02/16/2021   PLT 323.0 02/16/2021   GLUCOSE 105 (H) 02/15/2022   CHOL 242 (H) 12/24/2018   TRIG 101 12/24/2018   HDL 62 12/24/2018   LDLDIRECT 132.9 04/30/2007   LDLCALC 160 (H) 12/24/2018   ALT 22 02/15/2022   AST 19 02/15/2022   NA 142 02/15/2022   K 3.9 02/15/2022   CL 108 02/15/2022   CREATININE 1.02 02/15/2022   BUN 14 02/15/2022   CO2 27 02/15/2022   TSH 2.41 03/19/2019   INR 1.0 01/07/2019   HGBA1C 6.1 02/15/2022    DG Foot Complete Right  Result Date: 10/15/2022 CLINICAL DATA:  Fourth toe injury. EXAM: RIGHT FOOT COMPLETE - 3+ VIEW COMPARISON:  None Available. FINDINGS: The bones appear mildly demineralized. There is an acute extra-articular fracture of the midshaft of the 4th proximal phalanx, best seen on the PA view. This is minimally displaced. No other evidence of acute fracture or dislocation. Mild to moderate degenerative changes at the 1st metatarsophalangeal joint. Type 1 accessory navicular noted. No foreign body or soft tissue emphysema. IMPRESSION: Minimally displaced extra-articular fracture of the 4th proximal phalanx. Electronically Signed   By: Carey Bullocks M.D.   On: 10/15/2022 14:45    Assessment & Plan:   Problem List Items Addressed This Visit     Essential hypertension, benign    Well controlled  On Exforge every day Cont other Rx      Vitamin D deficiency - Primary    On Vit D      CKD (chronic kidney disease) stage 3, GFR 30-59 ml/min (HCC)    Monitor  GFR Hydrate well      Toe fracture    Healing well Nl shoes next week         No orders of the defined types were placed in this encounter.     Follow-up: Return in about 3 months (  around 03/14/2023) for a follow-up visit.  Sonda Primes, MD

## 2022-12-12 NOTE — Assessment & Plan Note (Signed)
Monitor GFR Hydrate well 

## 2022-12-13 ENCOUNTER — Other Ambulatory Visit: Payer: Self-pay | Admitting: Internal Medicine

## 2022-12-14 ENCOUNTER — Other Ambulatory Visit: Payer: Self-pay | Admitting: Internal Medicine

## 2022-12-14 ENCOUNTER — Other Ambulatory Visit: Payer: Self-pay | Admitting: Gastroenterology

## 2022-12-14 ENCOUNTER — Other Ambulatory Visit: Payer: Self-pay | Admitting: Vascular Surgery

## 2022-12-14 LAB — COMPREHENSIVE METABOLIC PANEL
ALT: 19 U/L (ref 0–35)
AST: 18 U/L (ref 0–37)
Albumin: 4 g/dL (ref 3.5–5.2)
Alkaline Phosphatase: 73 U/L (ref 39–117)
BUN: 21 mg/dL (ref 6–23)
CO2: 26 meq/L (ref 19–32)
Calcium: 9.7 mg/dL (ref 8.4–10.5)
Chloride: 106 meq/L (ref 96–112)
Creatinine, Ser: 1.24 mg/dL — ABNORMAL HIGH (ref 0.40–1.20)
GFR: 43.49 mL/min — ABNORMAL LOW (ref >60.00)
Glucose, Bld: 122 mg/dL — ABNORMAL HIGH (ref 70–99)
Potassium: 4.1 meq/L (ref 3.5–5.1)
Sodium: 141 meq/L (ref 135–145)
Total Bilirubin: 0.4 mg/dL (ref 0.2–1.2)
Total Protein: 6.7 g/dL (ref 6.0–8.3)

## 2022-12-14 LAB — CBC WITH DIFFERENTIAL/PLATELET
Basophils Absolute: 0 10*3/uL (ref 0.0–0.1)
Basophils Relative: 1 % (ref 0.0–3.0)
Eosinophils Absolute: 0.1 10*3/uL (ref 0.0–0.7)
Eosinophils Relative: 2.9 % (ref 0.0–5.0)
HCT: 37.6 % (ref 36.0–46.0)
Hemoglobin: 11.6 g/dL — ABNORMAL LOW (ref 12.0–15.0)
Lymphocytes Relative: 28.7 % (ref 12.0–46.0)
Lymphs Abs: 1.1 10*3/uL (ref 0.7–4.0)
MCHC: 30.9 g/dL (ref 30.0–36.0)
MCV: 84.3 fL (ref 78.0–100.0)
Monocytes Absolute: 0.5 10*3/uL (ref 0.1–1.0)
Monocytes Relative: 14 % — ABNORMAL HIGH (ref 3.0–12.0)
Neutro Abs: 2 10*3/uL (ref 1.4–7.7)
Neutrophils Relative %: 53.4 % (ref 43.0–77.0)
Platelets: 328 10*3/uL (ref 150.0–400.0)
RBC: 4.45 Mil/uL (ref 3.87–5.11)
RDW: 21 % — ABNORMAL HIGH (ref 11.5–15.5)
WBC: 3.8 10*3/uL — ABNORMAL LOW (ref 4.0–10.5)

## 2022-12-14 LAB — URINALYSIS, ROUTINE W REFLEX MICROSCOPIC
Bilirubin Urine: NEGATIVE
Hgb urine dipstick: NEGATIVE
Ketones, ur: NEGATIVE
Nitrite: NEGATIVE
Specific Gravity, Urine: 1.02 (ref 1.000–1.030)
Total Protein, Urine: 30 — AB
Urine Glucose: NEGATIVE
Urobilinogen, UA: 0.2 (ref 0.0–1.0)
pH: 6.5 (ref 5.0–8.0)

## 2022-12-14 LAB — LIPID PANEL
Cholesterol: 255 mg/dL — ABNORMAL HIGH (ref 0–200)
HDL: 89.1 mg/dL (ref >39.00)
LDL Cholesterol: 131 mg/dL — ABNORMAL HIGH (ref 0–99)
NonHDL: 166.1
Total CHOL/HDL Ratio: 3
Triglycerides: 174 mg/dL — ABNORMAL HIGH (ref 0.0–149.0)
VLDL: 34.8 mg/dL (ref 0.0–40.0)

## 2022-12-14 LAB — TSH: TSH: 4.28 u[IU]/mL (ref 0.35–5.50)

## 2022-12-14 LAB — HEMOGLOBIN A1C: Hgb A1c MFr Bld: 5.6 % (ref 4.6–6.5)

## 2022-12-19 ENCOUNTER — Ambulatory Visit: Payer: Medicare PPO | Admitting: Podiatry

## 2023-01-21 ENCOUNTER — Ambulatory Visit: Payer: Medicare PPO | Admitting: Internal Medicine

## 2023-01-23 ENCOUNTER — Other Ambulatory Visit: Payer: Self-pay | Admitting: Internal Medicine

## 2023-01-23 DIAGNOSIS — Z1231 Encounter for screening mammogram for malignant neoplasm of breast: Secondary | ICD-10-CM

## 2023-02-06 ENCOUNTER — Other Ambulatory Visit: Payer: Self-pay | Admitting: Internal Medicine

## 2023-02-14 ENCOUNTER — Ambulatory Visit
Admission: RE | Admit: 2023-02-14 | Discharge: 2023-02-14 | Disposition: A | Discharge status: Home / Self Care | Payer: Medicare PPO | Source: Ambulatory Visit | Attending: Internal Medicine | Admitting: Internal Medicine

## 2023-02-14 DIAGNOSIS — Z1231 Encounter for screening mammogram for malignant neoplasm of breast: Secondary | ICD-10-CM | POA: Diagnosis not present

## 2023-02-18 ENCOUNTER — Encounter: Payer: Self-pay | Admitting: Internal Medicine

## 2023-02-19 ENCOUNTER — Ambulatory Visit: Payer: Medicare PPO | Admitting: Vascular Surgery

## 2023-02-19 ENCOUNTER — Other Ambulatory Visit: Payer: Self-pay | Admitting: *Deleted

## 2023-02-19 ENCOUNTER — Encounter (HOSPITAL_COMMUNITY): Payer: Medicare PPO

## 2023-02-19 DIAGNOSIS — I6529 Occlusion and stenosis of unspecified carotid artery: Secondary | ICD-10-CM

## 2023-02-25 ENCOUNTER — Other Ambulatory Visit: Payer: Self-pay | Admitting: Vascular Surgery

## 2023-02-25 NOTE — Progress Notes (Unsigned)
   Patient name: Judy Carlson MRN: [id number] DOB: [birth year] Sex: female  REASON FOR VISIT: Follow-up right carotid endarterectomy on 01/12/2019  HPI: Judy Carlson is a 73 y.o. female here today for follow-up.  She underwent uneventful endarterectomy for symptomatic disease.  She was discharged home on postoperative day 1.  She reports no new neurologic deficits.  She does report the usual periincisional numbness and also some numbness of her right earlobe. This has improved. No new neurologic symptoms.  02/14/21: returns for carotid surveillance. No new neurologic symptoms. Doing well.   Current Outpatient Medications  Medication Sig Dispense Refill   acetaminophen (TYLENOL) 650 MG CR tablet Take 1,300 mg by mouth as needed for pain.      amLODipine-valsartan (EXFORGE) 5-160 MG tablet Take 1 tablet by mouth daily. 90 tablet 3   Cholecalciferol (VITAMIN D3) 50 MCG (2000 UT) capsule TAKE 1 CAPSULE (2,000 UNITS TOTAL) BY MOUTH DAILY. 90 capsule 3   clopidogrel (PLAVIX) 75 MG tablet TAKE 1 TABLET EVERY DAY 90 tablet 3   escitalopram (LEXAPRO) 5 MG tablet TAKE 1 TABLET EVERY DAY 90 tablet 3   ezetimibe (ZETIA) 10 MG tablet TAKE 1 TABLET EVERY DAY 90 tablet 3   furosemide (LASIX) 40 MG tablet TAKE 1/2 TABLET EVERY DAY AS NEEDED 45 tablet 3   metoprolol succinate (TOPROL-XL) 100 MG 24 hr tablet TAKE 1 TABLET DAILY WITH OR IMMEDIATELY FOLLOWING A MEAL. 90 tablet 3   Multiple Vitamin (MULTIVITAMIN) tablet Take 1 tablet by mouth daily.     Olopatadine HCl (PATADAY OP) Place 1 drop into both eyes daily as needed (For dry eyes).     pantoprazole (PROTONIX) 40 MG tablet TAKE 1 TABLET DAILY (STOP OMERPRAZOLE; NEED APPT WITH DR. DANIS. CALL [phone]) 90 tablet 1   potassium chloride (KLOR-CON) 10 MEQ tablet TAKE 1 TABLET EVERY DAY 90 tablet 3   No current facility-administered medications for this visit.     PHYSICAL EXAM: There were no vitals filed for this  visit.   GENERAL: The patient is a well-nourished female, in no acute distress. The vital signs are documented above. Well-healed right neck incision.   Neurologically intact  Duplex ultrasound 02/14/21: Unchanged appearance of duplex ultrasound, about 40% residual stenosis on right by velocity criteria.  MEDICAL ISSUES: Judy Carlson is a 72 y.o. female status post right carotid endarterectomy. Mild residual stenosis which is likely due to tortuousity. This appears stable overall.   The patient should continue best medical therapy for carotid artery stenosis including: Complete cessation from all tobacco products. Blood glucose control with goal A1c < 7%. Blood pressure control with goal blood pressure < 140/90 mmHg. Lipid reduction therapy with goal LDL-C <100 mg/dL (<63 if symptomatic from carotid artery stenosis).  Aspirin 81mg  PO QD OR Clopidogrel 75mg  PO QD. Atorvastatin 40-80mg  PO QD (or other "high intensity" statin therapy).  Follow up in 2 years with repeat carotid duplex  Rande Brunt. Lenell Antu, MD [facility] Phone Number: [phone] 02/25/2023 9:21 PM

## 2023-02-26 ENCOUNTER — Ambulatory Visit (HOSPITAL_COMMUNITY)
Admission: RE | Admit: 2023-02-26 | Discharge: 2023-02-26 | Disposition: A | Discharge status: Home / Self Care | Payer: Medicare PPO | Source: Ambulatory Visit | Attending: Surgery | Admitting: Surgery

## 2023-02-26 ENCOUNTER — Encounter: Payer: Self-pay | Admitting: Vascular Surgery

## 2023-02-26 ENCOUNTER — Ambulatory Visit: Payer: Medicare PPO | Admitting: Vascular Surgery

## 2023-02-26 VITALS — BP 136/87 | HR 81 | Temp 97.9°F | Resp 20 | Ht 66.0 in | Wt 183.0 lb

## 2023-02-26 DIAGNOSIS — I6529 Occlusion and stenosis of unspecified carotid artery: Secondary | ICD-10-CM | POA: Insufficient documentation

## 2023-03-10 ENCOUNTER — Encounter: Payer: Self-pay | Admitting: Internal Medicine

## 2023-03-11 ENCOUNTER — Telehealth: Payer: Self-pay

## 2023-03-11 NOTE — Telephone Encounter (Signed)
 Copied from CRM [id number]. Topic: General - Other >> Mar 11, 2023  2:36 PM Florestine Avers wrote:  Reason for CRM: Patient called saying "Byrd Hesselbach" called her and she would like a call back.

## 2023-03-11 NOTE — Telephone Encounter (Signed)
 Pt stated experienced missed step December 11th where top of right foot was turned under and body weight placed on bottom of foot. Now there is a bump just above ankle. No pain, soft to touch just concerned.  Please let me know if pt needs to come in for x-ray before her 03/14/2023 apptmnt.

## 2023-03-12 ENCOUNTER — Other Ambulatory Visit: Payer: Self-pay

## 2023-03-12 DIAGNOSIS — I6529 Occlusion and stenosis of unspecified carotid artery: Secondary | ICD-10-CM

## 2023-03-14 ENCOUNTER — Ambulatory Visit (INDEPENDENT_AMBULATORY_CARE_PROVIDER_SITE_OTHER): Payer: No Typology Code available for payment source | Admitting: Internal Medicine

## 2023-03-14 ENCOUNTER — Encounter: Payer: Self-pay | Admitting: Internal Medicine

## 2023-03-14 VITALS — BP 118/70 | HR 75 | Temp 98.0°F | Ht 66.0 in

## 2023-03-14 DIAGNOSIS — F32A Depression, unspecified: Secondary | ICD-10-CM

## 2023-03-14 DIAGNOSIS — N1831 Chronic kidney disease, stage 3a: Secondary | ICD-10-CM

## 2023-03-14 DIAGNOSIS — J449 Chronic obstructive pulmonary disease, unspecified: Secondary | ICD-10-CM

## 2023-03-14 DIAGNOSIS — F419 Anxiety disorder, unspecified: Secondary | ICD-10-CM

## 2023-03-14 DIAGNOSIS — I10 Essential (primary) hypertension: Secondary | ICD-10-CM | POA: Diagnosis not present

## 2023-03-14 DIAGNOSIS — Z23 Encounter for immunization: Secondary | ICD-10-CM

## 2023-03-14 NOTE — Assessment & Plan Note (Signed)
 Cont w/counseling in a group setting - pending

## 2023-03-14 NOTE — Progress Notes (Signed)
 Subjective:  Patient ID: Judy Carlson, female    DOB: 1950/09/09  Age: 73 y.o. MRN: [id number]  CC: Medical Management of Chronic Issues (3 Month follow up. Notes of quarter sized knot on right ankle (noticeable 02/13/23) after having a fall. No pain and no loss of mobility within ankle. Requesting pantoprazole , lexapro  be taken off of med list.)   HPI Judy Carlson presents for a 3 month follow up - CKD, HTN, Vit D def. Notes of knot on right ankle (noticeable 02/13/23) after having a fall. No pain and no loss of mobility within ankle. Requesting pantoprazole , lexapro  be taken off of med list.  Outpatient Medications Prior to Visit  Medication Sig Dispense Refill   acetaminophen  (TYLENOL ) 650 MG CR tablet Take 1,300 mg by mouth as needed for pain.      amLODipine -valsartan  (EXFORGE ) 5-160 MG tablet Take 1 tablet by mouth daily. 90 tablet 3   Cholecalciferol  (VITAMIN D3) 50 MCG (2000 UT) capsule TAKE 1 CAPSULE (2,000 UNITS TOTAL) BY MOUTH DAILY. 90 capsule 3   clopidogrel  (PLAVIX ) 75 MG tablet TAKE 1 TABLET EVERY DAY 90 tablet 3   ezetimibe  (ZETIA ) 10 MG tablet TAKE 1 TABLET EVERY DAY 90 tablet 3   furosemide  (LASIX ) 40 MG tablet TAKE 1/2 TABLET EVERY DAY AS NEEDED 45 tablet 3   metoprolol  succinate (TOPROL -XL) 100 MG 24 hr tablet TAKE 1 TABLET DAILY WITH OR IMMEDIATELY FOLLOWING A MEAL. 90 tablet 3   Multiple Vitamin (MULTIVITAMIN) tablet Take 1 tablet by mouth daily.     Olopatadine  HCl (PATADAY  OP) Place 1 drop into both eyes daily as needed (For dry eyes).     potassium chloride  (KLOR-CON ) 10 MEQ tablet TAKE 1 TABLET EVERY DAY 90 tablet 3   pantoprazole  (PROTONIX ) 40 MG tablet TAKE 1 TABLET DAILY (STOP OMERPRAZOLE; NEED APPT WITH DR. DANIS. CALL [phone]) 90 tablet 1   escitalopram  (LEXAPRO ) 5 MG tablet TAKE 1 TABLET EVERY DAY (Patient not taking: Reported on 03/14/2023) 90 tablet 3   No facility-administered medications prior to visit.    ROS: Review of Systems   Constitutional:  Negative for activity change, appetite change, chills, fatigue and unexpected weight change.  HENT:  Negative for congestion, mouth sores and sinus pressure.   Eyes:  Negative for visual disturbance.  Respiratory:  Negative for cough and chest tightness.   Gastrointestinal:  Negative for abdominal pain and nausea.  Genitourinary:  Negative for difficulty urinating, frequency and vaginal pain.  Musculoskeletal:  Negative for back pain and gait problem.  Skin:  Negative for pallor and rash.  Neurological:  Negative for dizziness, tremors, weakness, numbness and headaches.  Psychiatric/Behavioral:  Positive for dysphoric mood. Negative for confusion, sleep disturbance and suicidal ideas.     Objective:  BP 118/70   Pulse 75   Temp 98 F (36.7 C)   Ht 5' 6 (1.676 m)   SpO2 98%   BMI 29.54 kg/m   BP Readings from Last 3 Encounters:  03/14/23 118/70  02/26/23 136/87  12/12/22 118/68    Wt Readings from Last 3 Encounters:  02/26/23 183 lb (83 kg)  12/12/22 183 lb (83 kg)  10/23/22 182 lb (82.6 kg)    Physical Exam Constitutional:      General: She is not in acute distress.    Appearance: She is well-developed.  HENT:     Head: Normocephalic.     Right Ear: External ear normal.     Left Ear: External ear normal.  Nose: Nose normal.  Eyes:     General:        Right eye: No discharge.        Left eye: No discharge.     Conjunctiva/sclera: Conjunctivae normal.     Pupils: Pupils are equal, round, and reactive to light.  Neck:     Thyroid : No thyromegaly.     Vascular: No JVD.     Trachea: No tracheal deviation.  Cardiovascular:     Rate and Rhythm: Normal rate and regular rhythm.     Heart sounds: Normal heart sounds.  Pulmonary:     Effort: No respiratory distress.     Breath sounds: No stridor. No wheezing.  Abdominal:     General: Bowel sounds are normal. There is no distension.     Palpations: Abdomen is soft. There is no mass.      Tenderness: There is no abdominal tenderness. There is no guarding or rebound.  Musculoskeletal:        General: No tenderness.     Cervical back: Normal range of motion and neck supple. No rigidity.  Lymphadenopathy:     Cervical: No cervical adenopathy.  Skin:    Findings: No erythema or rash.  Neurological:     Mental Status: She is oriented to person, place, and time.     Cranial Nerves: No cranial nerve deficit.     Motor: No abnormal muscle tone.     Coordination: Coordination normal.     Deep Tendon Reflexes: Reflexes normal.  Psychiatric:        Behavior: Behavior normal.        Thought Content: Thought content normal.        Judgment: Judgment normal.     Lab Results  Component Value Date   WBC 3.8 (L) 12/14/2022   HGB 11.6 (L) 12/14/2022   HCT 37.6 12/14/2022   PLT 328.0 12/14/2022   GLUCOSE 122 (H) 12/14/2022   CHOL 255 (H) 12/14/2022   TRIG 174.0 (H) 12/14/2022   HDL 89.10 12/14/2022   LDLDIRECT 132.9 04/30/2007   LDLCALC 131 (H) 12/14/2022   ALT 19 12/14/2022   AST 18 12/14/2022   NA 141 12/14/2022   K 4.1 12/14/2022   CL 106 12/14/2022   CREATININE 1.24 (H) 12/14/2022   BUN 21 12/14/2022   CO2 26 12/14/2022   TSH 4.28 12/14/2022   INR 1.0 01/07/2019   HGBA1C 5.6 12/14/2022    VAS US  CAROTID Result Date: 02/26/2023 Carotid Arterial Duplex Study Patient Name:  Judy Carlson  Date of Exam:   02/26/2023 Medical Rec #: [id number]        Accession #:    [id number] Date of Birth: 07-23-1950        Patient Gender: F Patient Age:   73 years Exam Location:  [facility] Procedure:      VAS US  CAROTID Referring Phys: DEBBY ROBERTSON --------------------------------------------------------------------------------  Indications:       Carotid artery disease and Right CEA 01/12/20. Risk Factors:      Hypertension. Comparison Study:  02/14/21: Right 40-59% Left 1-39% Performing Technologist: [place] RVT  Examination Guidelines: A complete evaluation  includes B-mode imaging, spectral Doppler, color Doppler, and power Doppler as needed of all accessible portions of each vessel. Bilateral testing is considered an integral part of a complete examination. Limited examinations for reoccurring indications may be performed as noted.  Right Carotid Findings: +----------+--------+--------+--------+------------------+--------+           PSV  cm/sEDV cm/sStenosisPlaque DescriptionComments +----------+--------+--------+--------+------------------+--------+ CCA Prox  57      14                                         +----------+--------+--------+--------+------------------+--------+ CCA Mid   56      17                                         +----------+--------+--------+--------+------------------+--------+ CCA Distal52      13                                         +----------+--------+--------+--------+------------------+--------+ ICA Prox  124     46      40-59%  heterogenous               +----------+--------+--------+--------+------------------+--------+ ICA Mid   126     21                                tortuous +----------+--------+--------+--------+------------------+--------+ ICA Distal63      21                                         +----------+--------+--------+--------+------------------+--------+ ECA       40      18                                         +----------+--------+--------+--------+------------------+--------+ +----------+--------+-------+----------------+-------------------+           PSV cm/sEDV cmsDescribe        Arm Pressure (mmHG) +----------+--------+-------+----------------+-------------------+ Subclavian100            Multiphasic, WNL                    +----------+--------+-------+----------------+-------------------+ +---------+--------+--+--------+--+---------+ VertebralPSV cm/s37EDV cm/s12Antegrade +---------+--------+--+--------+--+---------+  Left Carotid  Findings: +----------+--------+--------+--------+------------------+--------+           PSV cm/sEDV cm/sStenosisPlaque DescriptionComments +----------+--------+--------+--------+------------------+--------+ CCA Prox  76      17                                         +----------+--------+--------+--------+------------------+--------+ CCA Mid   65      22                                         +----------+--------+--------+--------+------------------+--------+ CCA Distal101     26              heterogenous               +----------+--------+--------+--------+------------------+--------+ ICA Prox  54      17      1-39%                              +----------+--------+--------+--------+------------------+--------+  ICA Mid   77      21                                         +----------+--------+--------+--------+------------------+--------+ ICA Distal78      27                                         +----------+--------+--------+--------+------------------+--------+ ECA       106     23                                         +----------+--------+--------+--------+------------------+--------+ +----------+--------+--------+----------------+-------------------+           PSV cm/sEDV cm/sDescribe        Arm Pressure (mmHG) +----------+--------+--------+----------------+-------------------+ Subclavian181             Multiphasic, WNL                    +----------+--------+--------+----------------+-------------------+ +---------+--------+--+--------+--+---------+ VertebralPSV cm/s60EDV cm/s20Antegrade +---------+--------+--+--------+--+---------+   Summary: Right Carotid: Velocities in the right ICA are consistent with a 40-59%                stenosis. Left Carotid: Velocities in the left ICA are consistent with a 1-39% stenosis. Vertebrals:  Bilateral vertebral arteries demonstrate antegrade flow. Subclavians: Normal flow hemodynamics were seen  in bilateral subclavian              arteries. *See table(s) above for measurements and observations.  Electronically signed by Debby Robertson on 02/26/2023 at 3:36:14 PM.    Final     Assessment & Plan:   Problem List Items Addressed This Visit     RESOLVED: HYPERTENSION   Good BP      Essential hypertension, benign   Well controlled  On Exforge  every day Cont other Rx      Anxiety and depression   Cont w/counseling in a group setting - pending      CKD (chronic kidney disease) stage 3, GFR 30-59 ml/min (HCC)   Monitor GFR Hydrate well      COPD (chronic obstructive pulmonary disease) (HCC)   Stopped smoking May 2020      Other Visit Diagnoses       Immunization due    -  Primary         No orders of the defined types were placed in this encounter.     Follow-up: Return in about 3 months (around 06/12/2023) for a follow-up visit.  Marolyn Noel, MD

## 2023-03-14 NOTE — Assessment & Plan Note (Signed)
Monitor GFR Hydrate well 

## 2023-03-14 NOTE — Assessment & Plan Note (Signed)
Stopped smoking May 2020 

## 2023-03-14 NOTE — Assessment & Plan Note (Signed)
Good BP

## 2023-03-14 NOTE — Assessment & Plan Note (Signed)
Well controlled  On Exforge every day Cont other Rx

## 2023-03-29 ENCOUNTER — Other Ambulatory Visit: Payer: Self-pay

## 2023-03-29 MED ORDER — COMIRNATY 30 MCG/0.3ML IM SUSY
0.3000 mL | PREFILLED_SYRINGE | Freq: Once | INTRAMUSCULAR | 0 refills | Status: AC
  Filled 2023-03-29: qty 0.3, 1d supply, fill #0

## 2023-03-29 MED ORDER — FLUAD 0.5 ML IM SUSY
0.5000 mL | PREFILLED_SYRINGE | Freq: Once | INTRAMUSCULAR | 0 refills | Status: AC
  Filled 2023-03-29: qty 0.5, 1d supply, fill #0

## 2023-04-05 ENCOUNTER — Ambulatory Visit: Payer: Self-pay | Admitting: Internal Medicine

## 2023-04-05 ENCOUNTER — Inpatient Hospital Stay (HOSPITAL_COMMUNITY)
Admission: EM | Admit: 2023-04-05 | Discharge: 2023-04-08 | DRG: 659 | Disposition: A | Discharge status: Home / Self Care | Payer: No Typology Code available for payment source | Attending: Internal Medicine | Admitting: Internal Medicine

## 2023-04-05 ENCOUNTER — Emergency Department (HOSPITAL_COMMUNITY): Payer: No Typology Code available for payment source

## 2023-04-05 ENCOUNTER — Other Ambulatory Visit: Payer: Self-pay

## 2023-04-05 ENCOUNTER — Encounter (HOSPITAL_COMMUNITY): Payer: Self-pay | Admitting: Emergency Medicine

## 2023-04-05 DIAGNOSIS — E869 Volume depletion, unspecified: Secondary | ICD-10-CM | POA: Diagnosis present

## 2023-04-05 DIAGNOSIS — I129 Hypertensive chronic kidney disease with stage 1 through stage 4 chronic kidney disease, or unspecified chronic kidney disease: Secondary | ICD-10-CM | POA: Diagnosis not present

## 2023-04-05 DIAGNOSIS — E860 Dehydration: Secondary | ICD-10-CM | POA: Diagnosis present

## 2023-04-05 DIAGNOSIS — Z66 Do not resuscitate: Secondary | ICD-10-CM | POA: Diagnosis not present

## 2023-04-05 DIAGNOSIS — E785 Hyperlipidemia, unspecified: Secondary | ICD-10-CM | POA: Diagnosis present

## 2023-04-05 DIAGNOSIS — K922 Gastrointestinal hemorrhage, unspecified: Secondary | ICD-10-CM | POA: Diagnosis present

## 2023-04-05 DIAGNOSIS — Z87891 Personal history of nicotine dependence: Secondary | ICD-10-CM

## 2023-04-05 DIAGNOSIS — K2980 Duodenitis without bleeding: Secondary | ICD-10-CM

## 2023-04-05 DIAGNOSIS — N179 Acute kidney failure, unspecified: Secondary | ICD-10-CM | POA: Diagnosis not present

## 2023-04-05 DIAGNOSIS — J449 Chronic obstructive pulmonary disease, unspecified: Secondary | ICD-10-CM | POA: Diagnosis present

## 2023-04-05 DIAGNOSIS — Z833 Family history of diabetes mellitus: Secondary | ICD-10-CM | POA: Diagnosis not present

## 2023-04-05 DIAGNOSIS — Z91410 Personal history of adult physical and sexual abuse: Secondary | ICD-10-CM

## 2023-04-05 DIAGNOSIS — R000 Tachycardia, unspecified: Secondary | ICD-10-CM | POA: Diagnosis present

## 2023-04-05 DIAGNOSIS — N201 Calculus of ureter: Secondary | ICD-10-CM | POA: Diagnosis not present

## 2023-04-05 DIAGNOSIS — I7300 Raynaud's syndrome without gangrene: Secondary | ICD-10-CM | POA: Diagnosis present

## 2023-04-05 DIAGNOSIS — Z860100 Personal history of colon polyps, unspecified: Secondary | ICD-10-CM

## 2023-04-05 DIAGNOSIS — K2981 Duodenitis with bleeding: Secondary | ICD-10-CM | POA: Diagnosis present

## 2023-04-05 DIAGNOSIS — Z79899 Other long term (current) drug therapy: Secondary | ICD-10-CM | POA: Diagnosis not present

## 2023-04-05 DIAGNOSIS — Z8249 Family history of ischemic heart disease and other diseases of the circulatory system: Secondary | ICD-10-CM | POA: Diagnosis not present

## 2023-04-05 DIAGNOSIS — Z800 Family history of malignant neoplasm of digestive organs: Secondary | ICD-10-CM

## 2023-04-05 DIAGNOSIS — N390 Urinary tract infection, site not specified: Secondary | ICD-10-CM | POA: Insufficient documentation

## 2023-04-05 DIAGNOSIS — Z8711 Personal history of peptic ulcer disease: Secondary | ICD-10-CM

## 2023-04-05 DIAGNOSIS — Z888 Allergy status to other drugs, medicaments and biological substances status: Secondary | ICD-10-CM | POA: Diagnosis not present

## 2023-04-05 DIAGNOSIS — F418 Other specified anxiety disorders: Secondary | ICD-10-CM | POA: Diagnosis not present

## 2023-04-05 DIAGNOSIS — Z881 Allergy status to other antibiotic agents status: Secondary | ICD-10-CM

## 2023-04-05 DIAGNOSIS — N1832 Chronic kidney disease, stage 3b: Secondary | ICD-10-CM | POA: Diagnosis present

## 2023-04-05 DIAGNOSIS — Z8673 Personal history of transient ischemic attack (TIA), and cerebral infarction without residual deficits: Secondary | ICD-10-CM

## 2023-04-05 DIAGNOSIS — Z807 Family history of other malignant neoplasms of lymphoid, hematopoietic and related tissues: Secondary | ICD-10-CM

## 2023-04-05 DIAGNOSIS — R82998 Other abnormal findings in urine: Secondary | ICD-10-CM | POA: Diagnosis not present

## 2023-04-05 DIAGNOSIS — Z905 Acquired absence of kidney: Secondary | ICD-10-CM | POA: Diagnosis not present

## 2023-04-05 DIAGNOSIS — R319 Hematuria, unspecified: Secondary | ICD-10-CM

## 2023-04-05 DIAGNOSIS — Z7902 Long term (current) use of antithrombotics/antiplatelets: Secondary | ICD-10-CM | POA: Diagnosis not present

## 2023-04-05 DIAGNOSIS — R7303 Prediabetes: Secondary | ICD-10-CM | POA: Diagnosis present

## 2023-04-05 DIAGNOSIS — N202 Calculus of kidney with calculus of ureter: Secondary | ICD-10-CM | POA: Diagnosis not present

## 2023-04-05 DIAGNOSIS — Z90710 Acquired absence of both cervix and uterus: Secondary | ICD-10-CM

## 2023-04-05 DIAGNOSIS — R109 Unspecified abdominal pain: Secondary | ICD-10-CM | POA: Diagnosis not present

## 2023-04-05 DIAGNOSIS — F39 Unspecified mood [affective] disorder: Secondary | ICD-10-CM | POA: Diagnosis not present

## 2023-04-05 DIAGNOSIS — Z886 Allergy status to analgesic agent status: Secondary | ICD-10-CM

## 2023-04-05 DIAGNOSIS — Z8261 Family history of arthritis: Secondary | ICD-10-CM

## 2023-04-05 DIAGNOSIS — Z9841 Cataract extraction status, right eye: Secondary | ICD-10-CM

## 2023-04-05 DIAGNOSIS — Z818 Family history of other mental and behavioral disorders: Secondary | ICD-10-CM

## 2023-04-05 DIAGNOSIS — Z880 Allergy status to penicillin: Secondary | ICD-10-CM

## 2023-04-05 DIAGNOSIS — Z4682 Encounter for fitting and adjustment of non-vascular catheter: Secondary | ICD-10-CM | POA: Diagnosis not present

## 2023-04-05 DIAGNOSIS — Z83438 Family history of other disorder of lipoprotein metabolism and other lipidemia: Secondary | ICD-10-CM

## 2023-04-05 DIAGNOSIS — I10 Essential (primary) hypertension: Secondary | ICD-10-CM | POA: Diagnosis not present

## 2023-04-05 DIAGNOSIS — K5730 Diverticulosis of large intestine without perforation or abscess without bleeding: Secondary | ICD-10-CM | POA: Diagnosis not present

## 2023-04-05 HISTORY — DX: Cerebral infarction, unspecified: I63.9

## 2023-04-05 LAB — CBC
HCT: 41.7 % (ref 36.0–46.0)
Hemoglobin: 13.4 g/dL (ref 12.0–15.0)
MCH: 27 pg (ref 26.0–34.0)
MCHC: 32.1 g/dL (ref 30.0–36.0)
MCV: 84.1 fL (ref 80.0–100.0)
Platelets: 435 10*3/uL — ABNORMAL HIGH (ref 150–400)
RBC: 4.96 MIL/uL (ref 3.87–5.11)
RDW: 17.2 % — ABNORMAL HIGH (ref 11.5–15.5)
WBC: 7 10*3/uL (ref 4.0–10.5)
nRBC: 0 % (ref 0.0–0.2)

## 2023-04-05 LAB — COMPREHENSIVE METABOLIC PANEL
ALT: 27 U/L (ref 0–44)
AST: 26 U/L (ref 15–41)
Albumin: 3.9 g/dL (ref 3.5–5.0)
Alkaline Phosphatase: 65 U/L (ref 38–126)
Anion gap: 14 (ref 5–15)
BUN: 21 mg/dL (ref 8–23)
CO2: 20 mmol/L — ABNORMAL LOW (ref 22–32)
Calcium: 9.8 mg/dL (ref 8.9–10.3)
Chloride: 107 mmol/L (ref 98–111)
Creatinine, Ser: 2.23 mg/dL — ABNORMAL HIGH (ref 0.44–1.00)
GFR, Estimated: 23 mL/min — ABNORMAL LOW (ref >60)
Glucose, Bld: 121 mg/dL — ABNORMAL HIGH (ref 70–99)
Potassium: 3.6 mmol/L (ref 3.5–5.1)
Sodium: 141 mmol/L (ref 135–145)
Total Bilirubin: 0.6 mg/dL (ref 0.0–1.2)
Total Protein: 7.4 g/dL (ref 6.5–8.1)

## 2023-04-05 LAB — URINALYSIS, W/ REFLEX TO CULTURE (INFECTION SUSPECTED)
Bilirubin Urine: NEGATIVE
Glucose, UA: NEGATIVE mg/dL
Ketones, ur: NEGATIVE mg/dL
Nitrite: NEGATIVE
Protein, ur: 100 mg/dL — AB
RBC / HPF: >50 RBC/hpf (ref 0–5)
Specific Gravity, Urine: 1.015 (ref 1.005–1.030)
WBC, UA: >50 WBC/hpf (ref 0–5)
pH: 5 (ref 5.0–8.0)

## 2023-04-05 LAB — TYPE AND SCREEN
ABO/RH(D): A POS
Antibody Screen: NEGATIVE

## 2023-04-05 LAB — LIPASE, BLOOD: Lipase: 24 U/L (ref 11–51)

## 2023-04-05 LAB — I-STAT CG4 LACTIC ACID, ED: Lactic Acid, Venous: 1.6 mmol/L (ref 0.5–1.9)

## 2023-04-05 MED ORDER — SODIUM CHLORIDE 0.9 % IV SOLN: INTRAVENOUS | Status: AC

## 2023-04-05 MED ORDER — ONDANSETRON HCL 4 MG/2ML IJ SOLN
4.0000 mg | Freq: Once | INTRAMUSCULAR | Status: AC
  Administered 2023-04-05: 4 mg via INTRAVENOUS
  Filled 2023-04-05: qty 2

## 2023-04-05 MED ORDER — OXYCODONE HCL 5 MG PO TABS
2.5000 mg | ORAL_TABLET | Freq: Four times a day (QID) | ORAL | Status: DC | PRN
  Administered 2023-04-06 – 2023-04-07 (×2): 2.5 mg via ORAL
  Filled 2023-04-05 (×2): qty 1

## 2023-04-05 MED ORDER — EZETIMIBE 10 MG PO TABS
10.0000 mg | ORAL_TABLET | Freq: Every day | ORAL | Status: DC
  Administered 2023-04-06 – 2023-04-08 (×3): 10 mg via ORAL
  Filled 2023-04-05 (×3): qty 1

## 2023-04-05 MED ORDER — SODIUM CHLORIDE 0.9 % IV SOLN
2.0000 g | Freq: Once | INTRAVENOUS | Status: AC
  Administered 2023-04-05: 2 g via INTRAVENOUS
  Filled 2023-04-05: qty 20

## 2023-04-05 MED ORDER — SODIUM CHLORIDE 0.9 % IV SOLN
1.0000 g | INTRAVENOUS | Status: DC
  Administered 2023-04-06 – 2023-04-07 (×2): 1 g via INTRAVENOUS
  Filled 2023-04-05 (×2): qty 10

## 2023-04-05 MED ORDER — ONDANSETRON 4 MG PO TBDP: 4.0000 mg | ORAL_TABLET | Freq: Once | ORAL | Status: DC

## 2023-04-05 MED ORDER — ONDANSETRON HCL 4 MG/2ML IJ SOLN
4.0000 mg | Freq: Four times a day (QID) | INTRAMUSCULAR | Status: DC | PRN
  Administered 2023-04-07: 4 mg via INTRAVENOUS
  Filled 2023-04-05: qty 2

## 2023-04-05 MED ORDER — PANTOPRAZOLE SODIUM 40 MG PO TBEC
40.0000 mg | DELAYED_RELEASE_TABLET | Freq: Every day | ORAL | Status: DC
  Filled 2023-04-05: qty 1

## 2023-04-05 MED ORDER — TAMSULOSIN HCL 0.4 MG PO CAPS
0.4000 mg | ORAL_CAPSULE | Freq: Every day | ORAL | Status: DC
Start: 2023-04-05 — End: 2023-04-08
  Administered 2023-04-05 – 2023-04-07 (×3): 0.4 mg via ORAL
  Filled 2023-04-05 (×3): qty 1

## 2023-04-05 MED ORDER — LACTATED RINGERS IV BOLUS
1000.0000 mL | Freq: Once | INTRAVENOUS | Status: AC
  Administered 2023-04-05: 1000 mL via INTRAVENOUS

## 2023-04-05 MED ORDER — OXYCODONE HCL 5 MG PO TABS
5.0000 mg | ORAL_TABLET | Freq: Four times a day (QID) | ORAL | Status: DC | PRN
  Administered 2023-04-07: 5 mg via ORAL
  Filled 2023-04-05: qty 1

## 2023-04-05 MED ORDER — PANTOPRAZOLE SODIUM 40 MG PO TBEC
40.0000 mg | DELAYED_RELEASE_TABLET | Freq: Two times a day (BID) | ORAL | Status: DC
  Administered 2023-04-06 – 2023-04-08 (×5): 40 mg via ORAL
  Filled 2023-04-05 (×5): qty 1

## 2023-04-05 MED ORDER — CLOPIDOGREL BISULFATE 75 MG PO TABS
75.0000 mg | ORAL_TABLET | Freq: Every day | ORAL | Status: DC
  Administered 2023-04-06 – 2023-04-08 (×3): 75 mg via ORAL
  Filled 2023-04-05 (×3): qty 1

## 2023-04-05 MED ORDER — ACETAMINOPHEN 500 MG PO TABS
1000.0000 mg | ORAL_TABLET | Freq: Four times a day (QID) | ORAL | Status: DC | PRN
  Administered 2023-04-06 – 2023-04-08 (×2): 1000 mg via ORAL
  Filled 2023-04-05 (×3): qty 2

## 2023-04-05 MED ORDER — HYDROMORPHONE HCL 1 MG/ML IJ SOLN: 0.5000 mg | Freq: Once | INTRAMUSCULAR | Status: DC

## 2023-04-05 MED ORDER — METOPROLOL SUCCINATE ER 50 MG PO TB24
100.0000 mg | ORAL_TABLET | Freq: Every day | ORAL | Status: DC
  Administered 2023-04-07 – 2023-04-08 (×2): 100 mg via ORAL
  Filled 2023-04-05 (×3): qty 2

## 2023-04-05 NOTE — ED Triage Notes (Signed)
Abd pain, vomiting and diarrhea x 3 days. Pt states stool has been black. Denies any OTC medication

## 2023-04-05 NOTE — ED Provider Triage Note (Signed)
Emergency Medicine Provider Triage Evaluation Note  Judy Carlson , a 73 y.o. female  was evaluated in triage.  Pt complains of abdominal pain.  Review of Systems  Positive:  Negative:   Physical Exam  BP 111/77 (BP Location: Right Arm)   Pulse (!) 118   Temp 98.5 F (36.9 C) (Oral)   Resp 18   Ht 5\' 5"  (1.651 m)   Wt 79.4 kg   SpO2 97%   BMI 29.12 kg/m  Gen:   Awake, no distress   Resp:  Normal effort  MSK:   Moves extremities without difficulty  Other:    Medical Decision Making  Medically screening exam initiated at 4:55 PM.  Appropriate orders placed.  MARVEL SAPP was informed that the remainder of the evaluation will be completed by another provider, this initial triage assessment does not replace that evaluation, and the importance of remaining in the ED until their evaluation is complete.  Lower abdominal pain, vomiting, and diarrhea x3 days. Patient stating that her stools are black but states that stools have been looking this way constantly over the past year. No iron pills or pepto-bismol. Patient stating that she had COVID/flu shot Friday and has also been having rhinorrhea and coughing.   No fever.    Dorthy Cooler, [place] 04/05/23 [phone]

## 2023-04-05 NOTE — Treatment Plan (Signed)
Urology treatment plan note: 73 year old female with history of angioedema, carotid artery occlusion, depression, diverticulitis, GERD, hyperlipidemia, hypertension who presents with abdominal pain, emesis and diarrhea as well as black stools to the [facility] ED.  On arrival to the ED, patient was tachycardic to 118, otherwise rest of her vitals were within normal limits.  Labs notable for creatinine of 2.23.  Baseline creatinine between 1-1.2.  No evidence of leukocytosis.  UA contaminated with 11-20 squamous cells.  Showing moderate leukocyte esterase, greater than 50 red blood cells and greater than 50 white blood cells however this could be in the setting of contamination by the squamous cells.  Only a few bacteria were seen.  Given abdominal pain, CT scan was performed.  No evidence of hydronephrosis however there was a 3 mm distal right stone.  AKI is an indication for ureteral stent in the setting of kidney stone.  Although her stone is nonobstructing, patient does have an AKI which is almost double her baseline.   At this time, will recommend admission for fluids.  Trend creatinine.  Please repeat UA, if necessary to get a clean sample, you may need to use INO cath.  Please make patient n.p.o. for possible stent.  Formal consult to follow in the a.m.  Jerald Kief, MD, PhD [facility] Resident  [facility]

## 2023-04-05 NOTE — Telephone Encounter (Signed)
Chief Complaint: abdominal pain Symptoms: N/V, hemoptysis x 1, black stool, diarrhea, abd pain Frequency: 2-3 days ago Pertinent Negatives: Patient denies fever Disposition: [x] ED /[] Urgent Care (no appt availability in office) / [] Appointment(In office/virtual)/ []  [facility]/ [] Home Care/ [] Refused Recommended Disposition /[] [facility]/ []  Follow-up with PCP Additional Notes: Patient c/o abdominal pain with N/V/D and black stools as well has hemoptysis x 1. Pt denies fevers. Triager strongly encouraged ED for further evaluation. Also encouraged to call another adult for transport, and if unable to, for patient to call 911. Patient verbalized understanding and to call back with worsening symptoms.    Copied from CRM [id number]. Topic: Clinical - Red Word Triage >> Apr 05, 2023 12:19 PM Eunice Blase wrote: Red Word that prompted transfer to Nurse Triage: Pt has an abdominal pain, black stool Reason for Disposition  [1] Vomiting AND [2] contains red blood or black ("coffee ground") material  (Exception: Few red streaks in vomit that only happened once.)  Black or tarry bowel movements  Answer Assessment - Initial Assessment Questions 1. LOCATION: "Where does it hurt?"      Mid-R abdominal pain 2. RADIATION: "Does the pain shoot anywhere else?" (e.g., chest, back)     no 3. ONSET: "When did the pain begin?" (e.g., minutes, hours or days ago)      2-3 days ago 4. SUDDEN: "Gradual or sudden onset?"     sudden 5. PATTERN "Does the pain come and go, or is it constant?"    - If it comes and goes: "How long does it last?" "Do you have pain now?"     (Note: Comes and goes means the pain is intermittent. It goes away completely between bouts.)    - If constant: "Is it getting better, staying the same, or getting worse?"      (Note: Constant means the pain never goes away completely; most serious pain is constant and gets worse.)      Comes and goes 6. SEVERITY: "How bad is  the pain?"  (e.g., Scale 1-10; mild, moderate, or severe)    - MILD (1-3): Doesn't interfere with normal activities, abdomen soft and not tender to touch.     - MODERATE (4-7): Interferes with normal activities or awakens from sleep, abdomen tender to touch.     - SEVERE (8-10): Excruciating pain, doubled over, unable to do any normal activities.       8/10 7. RECURRENT SYMPTOM: "Have you ever had this type of stomach pain before?" If Yes, ask: "When was the last time?" and "What happened that time?"      Has abd pain Off and on - usually with BM This time is different 8. CAUSE: "What do you think is causing the stomach pain?"     no 9. RELIEVING/AGGRAVATING FACTORS: "What makes it better or worse?" (e.g., antacids, bending or twisting motion, bowel movement)     Eating/drinking makes it worse 10. OTHER SYMPTOMS: "Do you have any other symptoms?" (e.g., back pain, diarrhea, fever, urination pain, vomiting)       N/V/D Endorsed hemoptysis, black stool afebrile  Answer Assessment - Initial Assessment Questions 1. APPEARANCE of BLOOD: "What does the blood look like?" (e.g., pink, red blood, coffee-grounds)     Bright red 2. AMOUNT: "How much blood was lost?" (e.g., few streaks or strands, tablespoon, cup)     streaks 3. VOMITING BLOOD: "How many times did it happen?" or "How many times in the past 24 hours?"  X1 in the past 2-3 days 4. VOMITING WITHOUT BLOOD: "How many times in the past 24 hours?"      6-7 vomit in total 5. ONSET: "When did vomiting of blood begin?"     First day this started -- Wednesday night 6. CAUSE: "What do you think is causing the vomiting of blood?"     I dont know 7. BLOOD THINNERS: "Do you take any blood thinners?" (e.g., Coumadin/warfarin, Pradaxa/dabigatran, aspirin)     no 8. DEHYDRATION: "Are there any signs of dehydration?" "When was the last time you urinated?" "Do you feel dizzy?"     Yes, dry lips/mouth Reports taking sips of water  10. DIARRHEA:  "Is there any diarrhea?" If Yes, ask: "How many times today?"        none 11. OTHER SYMPTOMS: "Do you have any other symptoms?" (e.g., fever, blood in stool)       Black stool, diarrhea Reports has been having black stools for a while-- denies taking iron supplements  Protocols used: Abdominal Pain - Female-A-AH, Vomiting Blood-A-AH

## 2023-04-05 NOTE — Telephone Encounter (Unsigned)
Copied from CRM [id number]. Topic: Clinical - Red Word Triage >> Apr 05, 2023 12:19 PM Eunice Blase wrote: Red Word that prompted transfer to Nurse Triage: Pt has an abdominal pain, black stool
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
## 2023-04-06 ENCOUNTER — Inpatient Hospital Stay (HOSPITAL_COMMUNITY): Payer: No Typology Code available for payment source | Admitting: Certified Registered Nurse Anesthetist

## 2023-04-06 ENCOUNTER — Encounter (HOSPITAL_COMMUNITY)
Admission: EM | Disposition: A | Discharge status: Home / Self Care | Payer: Self-pay | Source: Home / Self Care | Attending: Internal Medicine

## 2023-04-06 ENCOUNTER — Encounter (HOSPITAL_COMMUNITY): Payer: Self-pay | Admitting: Internal Medicine

## 2023-04-06 ENCOUNTER — Inpatient Hospital Stay (HOSPITAL_COMMUNITY): Payer: No Typology Code available for payment source

## 2023-04-06 DIAGNOSIS — I10 Essential (primary) hypertension: Secondary | ICD-10-CM | POA: Diagnosis not present

## 2023-04-06 DIAGNOSIS — Z905 Acquired absence of kidney: Secondary | ICD-10-CM | POA: Diagnosis not present

## 2023-04-06 DIAGNOSIS — F418 Other specified anxiety disorders: Secondary | ICD-10-CM | POA: Diagnosis not present

## 2023-04-06 DIAGNOSIS — N201 Calculus of ureter: Secondary | ICD-10-CM | POA: Diagnosis not present

## 2023-04-06 DIAGNOSIS — J449 Chronic obstructive pulmonary disease, unspecified: Secondary | ICD-10-CM | POA: Diagnosis not present

## 2023-04-06 DIAGNOSIS — Z4682 Encounter for fitting and adjustment of non-vascular catheter: Secondary | ICD-10-CM | POA: Diagnosis not present

## 2023-04-06 DIAGNOSIS — N179 Acute kidney failure, unspecified: Secondary | ICD-10-CM | POA: Diagnosis not present

## 2023-04-06 DIAGNOSIS — R82998 Other abnormal findings in urine: Secondary | ICD-10-CM | POA: Diagnosis not present

## 2023-04-06 HISTORY — PX: CYSTOSCOPY WITH RETROGRADE PYELOGRAM, URETEROSCOPY AND STENT PLACEMENT: SHX5789

## 2023-04-06 LAB — URINALYSIS, W/ REFLEX TO CULTURE (INFECTION SUSPECTED)
Bilirubin Urine: NEGATIVE
Glucose, UA: NEGATIVE mg/dL
Glucose, UA: NEGATIVE mg/dL
Ketones, ur: NEGATIVE mg/dL
Ketones, ur: NEGATIVE mg/dL
Nitrite: NEGATIVE
Nitrite: NEGATIVE
Protein, ur: 30 mg/dL — AB
Protein, ur: NEGATIVE mg/dL
Specific Gravity, Urine: 1.011 (ref 1.005–1.030)
Specific Gravity, Urine: 1.025 (ref 1.005–1.030)
pH: 5 (ref 5.0–8.0)
pH: 5.5 (ref 5.0–8.0)

## 2023-04-06 LAB — CBC
HCT: 36.1 % (ref 36.0–46.0)
Hemoglobin: 11.8 g/dL — ABNORMAL LOW (ref 12.0–15.0)
MCH: 27.3 pg (ref 26.0–34.0)
MCHC: 32.7 g/dL (ref 30.0–36.0)
MCV: 83.4 fL (ref 80.0–100.0)
Platelets: 387 10*3/uL (ref 150–400)
RBC: 4.33 MIL/uL (ref 3.87–5.11)
RDW: 16.8 % — ABNORMAL HIGH (ref 11.5–15.5)
WBC: 6.3 10*3/uL (ref 4.0–10.5)
nRBC: 0 % (ref 0.0–0.2)

## 2023-04-06 LAB — PROTIME-INR
INR: 1.1 (ref 0.8–1.2)
Prothrombin Time: 14.3 s (ref 11.4–15.2)

## 2023-04-06 LAB — BASIC METABOLIC PANEL
Anion gap: 14 (ref 5–15)
BUN: 24 mg/dL — ABNORMAL HIGH (ref 8–23)
CO2: 21 mmol/L — ABNORMAL LOW (ref 22–32)
Calcium: 9.4 mg/dL (ref 8.9–10.3)
Chloride: 106 mmol/L (ref 98–111)
Creatinine, Ser: 2.34 mg/dL — ABNORMAL HIGH (ref 0.44–1.00)
GFR, Estimated: 22 mL/min — ABNORMAL LOW (ref >60)
Glucose, Bld: 114 mg/dL — ABNORMAL HIGH (ref 70–99)
Potassium: 3.6 mmol/L (ref 3.5–5.1)
Sodium: 141 mmol/L (ref 135–145)

## 2023-04-06 LAB — MAGNESIUM: Magnesium: 2 mg/dL (ref 1.7–2.4)

## 2023-04-06 LAB — PHOSPHORUS: Phosphorus: 4.4 mg/dL (ref 2.5–4.6)

## 2023-04-06 SURGERY — CYSTOURETEROSCOPY, WITH RETROGRADE PYELOGRAM AND STENT INSERTION
Anesthesia: General | Site: Ureter | Laterality: Right

## 2023-04-06 MED ORDER — FENTANYL CITRATE (PF) 250 MCG/5ML IJ SOLN
INTRAMUSCULAR | Status: DC | PRN
  Administered 2023-04-06 (×2): 25 ug via INTRAVENOUS
  Administered 2023-04-06: 50 ug via INTRAVENOUS

## 2023-04-06 MED ORDER — CHLORHEXIDINE GLUCONATE 0.12 % MT SOLN: 15.0000 mL | Freq: Once | OROMUCOSAL | Status: AC

## 2023-04-06 MED ORDER — ACETAMINOPHEN 10 MG/ML IV SOLN: 1000.0000 mg | Freq: Once | INTRAVENOUS | Status: DC | PRN

## 2023-04-06 MED ORDER — FENTANYL CITRATE (PF) 250 MCG/5ML IJ SOLN
INTRAMUSCULAR | Status: AC
  Filled 2023-04-06: qty 5

## 2023-04-06 MED ORDER — STERILE WATER FOR IRRIGATION IR SOLN
Status: DC | PRN
  Administered 2023-04-06: 3000 mL via INTRAVESICAL

## 2023-04-06 MED ORDER — PROPOFOL 10 MG/ML IV BOLUS
INTRAVENOUS | Status: DC | PRN
  Administered 2023-04-06: 120 mg via INTRAVENOUS

## 2023-04-06 MED ORDER — PHENYLEPHRINE 80 MCG/ML (10ML) SYRINGE FOR IV PUSH (FOR BLOOD PRESSURE SUPPORT)
PREFILLED_SYRINGE | INTRAVENOUS | Status: DC | PRN
  Administered 2023-04-06 (×4): 80 ug via INTRAVENOUS
  Administered 2023-04-06 (×4): 160 ug via INTRAVENOUS

## 2023-04-06 MED ORDER — ONDANSETRON HCL 4 MG/2ML IJ SOLN
INTRAMUSCULAR | Status: DC | PRN
  Administered 2023-04-06: 4 mg via INTRAVENOUS

## 2023-04-06 MED ORDER — FENTANYL CITRATE (PF) 100 MCG/2ML IJ SOLN: 25.0000 ug | INTRAMUSCULAR | Status: DC | PRN

## 2023-04-06 MED ORDER — ALBUMIN HUMAN 5 % IV SOLN: INTRAVENOUS | Status: DC | PRN

## 2023-04-06 MED ORDER — MIDAZOLAM HCL 2 MG/2ML IJ SOLN
INTRAMUSCULAR | Status: AC
  Filled 2023-04-06: qty 2

## 2023-04-06 MED ORDER — PROPOFOL 10 MG/ML IV BOLUS
INTRAVENOUS | Status: AC
  Filled 2023-04-06: qty 20

## 2023-04-06 MED ORDER — ORAL CARE MOUTH RINSE: 15.0000 mL | Freq: Once | OROMUCOSAL | Status: AC

## 2023-04-06 MED ORDER — SODIUM CHLORIDE 0.9 % IV SOLN: INTRAVENOUS | Status: DC

## 2023-04-06 MED ORDER — PHENYLEPHRINE HCL-NACL 20-0.9 MG/250ML-% IV SOLN: INTRAVENOUS | Status: DC | PRN

## 2023-04-06 MED ORDER — IOHEXOL 300 MG/ML  SOLN
INTRAMUSCULAR | Status: DC | PRN
  Administered 2023-04-06: 2 mL

## 2023-04-06 MED ORDER — EPHEDRINE SULFATE-NACL 50-0.9 MG/10ML-% IV SOSY
PREFILLED_SYRINGE | INTRAVENOUS | Status: DC | PRN
  Administered 2023-04-06: 5 mg via INTRAVENOUS

## 2023-04-06 MED ORDER — 0.9 % SODIUM CHLORIDE (POUR BTL) OPTIME
TOPICAL | Status: DC | PRN
  Administered 2023-04-06: 1000 mL

## 2023-04-06 MED ORDER — LIDOCAINE 2% (20 MG/ML) 5 ML SYRINGE
INTRAMUSCULAR | Status: DC | PRN
  Administered 2023-04-06: 40 mg via INTRAVENOUS

## 2023-04-06 MED ORDER — CHLORHEXIDINE GLUCONATE 0.12 % MT SOLN
OROMUCOSAL | Status: AC
  Administered 2023-04-06: 15 mL via OROMUCOSAL
  Filled 2023-04-06: qty 15

## 2023-04-06 MED ORDER — MIDAZOLAM HCL 2 MG/2ML IJ SOLN
INTRAMUSCULAR | Status: DC | PRN
  Administered 2023-04-06: 2 mg via INTRAVENOUS

## 2023-04-06 SURGICAL SUPPLY — 21 items
BAG URINE DRAIN 2000ML AR STRL (UROLOGICAL SUPPLIES) IMPLANT
BAG URO CATCHER STRL LF (MISCELLANEOUS) ×3 IMPLANT
BASKET LASER NITINOL 1.9FR (BASKET) ×1 IMPLANT
CATH FOLEY 2WAY SLVR 5CC 16FR (CATHETERS) IMPLANT
CATH URET 5FR 28IN OPEN ENDED (CATHETERS) IMPLANT
CATH URETL OPEN END 6FR 70 (CATHETERS) ×1 IMPLANT
GLOVE BIO SURGEON STRL SZ7.5 (GLOVE) ×5 IMPLANT
GOWN STRL REUS W/ TWL LRG LVL3 (GOWN DISPOSABLE) ×3 IMPLANT
GOWN STRL REUS W/ TWL XL LVL3 (GOWN DISPOSABLE) ×4 IMPLANT
GUIDEWIRE ANG ZIPWIRE 038X150 (WIRE) IMPLANT
GUIDEWIRE STR DUAL SENSOR (WIRE) ×4 IMPLANT
KIT TURNOVER KIT B (KITS) ×3 IMPLANT
MANIFOLD NEPTUNE II (INSTRUMENTS) ×7 IMPLANT
NS IRRIG 1000ML POUR BTL (IV SOLUTION) ×3 IMPLANT
PACK CYSTO (CUSTOM PROCEDURE TRAY) ×3 IMPLANT
STENT URET 6FRX24 CONTOUR (STENTS) ×1 IMPLANT
STENT URET 6FRX26 CONTOUR (STENTS) IMPLANT
SYPHON OMNI JUG (MISCELLANEOUS) ×2 IMPLANT
TOWEL GREEN STERILE FF (TOWEL DISPOSABLE) ×3 IMPLANT
TUBE CONNECTING 12X1/4 (SUCTIONS) ×1 IMPLANT
WATER STERILE IRR 3000ML UROMA (IV SOLUTION) ×3 IMPLANT
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
## 2023-04-06 NOTE — Anesthesia Preprocedure Evaluation (Signed)
Anesthesia Evaluation  Patient identified by MRN, date of birth, ID band Patient awake    Reviewed: Allergy & Precautions, NPO status , Patient's Chart, lab work & pertinent test results  Airway Mallampati: II  TM Distance: >3 FB Neck ROM: Full    Dental no notable dental hx.    Pulmonary COPD, Patient abstained from smoking., former smoker   Pulmonary exam normal        Cardiovascular hypertension, Pt. on medications and Pt. on home beta blockers  Rhythm:Regular Rate:Normal     Neuro/Psych   Anxiety Depression    negative neurological ROS     GI/Hepatic Neg liver ROS, PUD,GERD  ,,  Endo/Other  negative endocrine ROS    Renal/GU   negative genitourinary   Musculoskeletal  (+) Arthritis , Osteoarthritis,    Abdominal Normal abdominal exam  (+)   Peds  Hematology  (+) Blood dyscrasia, anemia   Anesthesia Other Findings   Reproductive/Obstetrics                             Anesthesia Physical Anesthesia Plan  ASA: 2  Anesthesia Plan: General   Post-op Pain Management:    Induction: Intravenous  PONV Risk Score and Plan: 3 and Ondansetron and Dexamethasone  Airway Management Planned: Mask and LMA  Additional Equipment: None  Intra-op Plan:   Post-operative Plan: Extubation in OR  Informed Consent: I have reviewed the patients History and Physical, chart, labs and discussed the procedure including the risks, benefits and alternatives for the proposed anesthesia with the patient or authorized representative who has indicated his/her understanding and acceptance.     Dental advisory given  Plan Discussed with: CRNA  Anesthesia Plan Comments:        Anesthesia Quick Evaluation

## 2023-04-06 NOTE — Plan of Care (Signed)

## 2023-04-06 NOTE — Anesthesia Postprocedure Evaluation (Signed)
Anesthesia Post Note  Patient: KRISTEN FROMM  Procedure(s) Performed: CYSTOSCOPY WITH RETROGRADE PYELOGRAM, URETEROSCOPY AND STENT PLACEMENT WITH STONE BASKET EXTRACTION (Right: Ureter)     Patient location during evaluation: PACU Anesthesia Type: General Level of consciousness: awake and alert Pain management: pain level controlled Vital Signs Assessment: post-procedure vital signs reviewed and stable Respiratory status: spontaneous breathing, nonlabored ventilation, respiratory function stable and patient connected to nasal cannula oxygen Cardiovascular status: blood pressure returned to baseline and stable Postop Assessment: no apparent nausea or vomiting Anesthetic complications: no   No notable events documented.  Last Vitals:  Vitals:   04/06/23 1430 04/06/23 1445  BP: 114/66 105/67  Pulse: 85   Resp: 15 16  Temp:    SpO2: 93% 94%    Last Pain:  Vitals:   04/06/23 1330  TempSrc:   PainSc: 0-No pain                 Earl Lites P Gabor Lusk

## 2023-04-06 NOTE — Op Note (Signed)
Preoperative diagnosis: Right ureteral stone Post operative diagnosis: Same  Procedure: Cystoscopy with right retrograde pyelogram, right ureteroscopy stone basket extraction and right ureteral stent removal  Surgeon: Mena Goes  Resident Surgeon: Gordan Payment  Anesthesia: General  Indication for procedure: Judy Carlson is a 74 year old female with a 3 mm right distal stone.  She had a bump in her creatinine and mild tachycardia on MET.  She is brought today for above procedure.  Findings: The vulva and introitus appear normal without lesion.  Meatus appeared normal.  On cystoscopy the urethra and bladder were unremarkable.  No stone or foreign body in the bladder.  The trigone and ureteral orifices were in their normal orthotopic position.  Right retrograde pyelogram-this outlined a single ureter single collecting system unit with small filling defect right behind the pubic bone consistent with the ureteral stone.  Right ureteroscopy confirmed the right distal stone and it was ensnared with a 0 tip basket and extracted without difficulty.  Description of procedure: After consent was obtained patient brought to the operating room.  After adequate anesthesia she is placed in lithotomy position and prepped and draped in the usual sterile fashion.  Cystoscope was passed per urethra and the bladder inspected.  Right ureteral orifice was cannulated with the 6 [place] open-ended catheter and a gentle retrograde was performed.  A sensor wire was then advanced and coiled in the upper calyx under fluoroscopy.  I then personally took the 4.5 [place] single channel semirigid scope passed it in the ureteral orifice and then used a second sensor wire to help guide into the ureter through the UVJ.  The stone was visible in the distal ureter.  The stone traveled proximal and I followed it up toward the mid ureter to dilate the intramural tunnel a bit with the scope.  I backed out and inspected the ureter and thought it  was favorable to simply extract the stone.  A 0 tip basket was advanced and the stone was ensnared.  It was carefully extracted without difficulty.  I then passed the semirigid reinspected the distal ureter up into the mid ureter and there was no other stone fragment and no ureteral injury.  Inspection was several centimeters above the location of the stone.  The irrigation was not under pressure and I kept the irrigation low.  We then placed a 6 x 24 cm ureteral stent under fluoroscopic guidance.  The wire was removed with good coil seen up in the kidney and a good coil in the bladder.  The string was secured to the patient.  She was awakened and taken the cover room in stable condition.  Complications: None  Blood loss: Minimal  Specimens to office lab: Ureteral stone  Drains: 6 x 24 cm right ureteral stent with tether  Disposition: Patient stable to PACU.

## 2023-04-06 NOTE — Progress Notes (Signed)
Secured chatted with Matthew-[place], [place], MD in regards to administration of metoprolol prior to procedure. MD stated, "lets hold it for now. she can have it after" This nurse did not give medication. Pre op nurse made aware and also informing patient of information as well.

## 2023-04-06 NOTE — Transfer of Care (Signed)
Immediate Anesthesia Transfer of Care Note  Patient: Judy Carlson  Procedure(s) Performed: CYSTOSCOPY WITH RETROGRADE PYELOGRAM, URETEROSCOPY AND STENT PLACEMENT WITH STONE BASKET EXTRACTION (Right: Ureter)  Patient Location: PACU  Anesthesia Type:General  Level of Consciousness: awake, alert , and oriented  Airway & Oxygen Therapy: Patient Spontanous Breathing  Post-op Assessment: Report given to RN and Post -op Vital signs reviewed and stable  Post vital signs: Reviewed and stable  Last Vitals:  Vitals Value Taken Time  BP 114/61 04/06/23 1330  Temp 36.7 C 04/06/23 1330  Pulse 105 04/06/23 1331  Resp 20 04/06/23 1331  SpO2 96 % 04/06/23 1331  Vitals shown include unfiled device data.  Last Pain:  Vitals:   04/06/23 1151  TempSrc: Oral  PainSc:          Complications: No notable events documented.

## 2023-04-06 NOTE — Anesthesia Procedure Notes (Signed)
Procedure Name: LMA Insertion Date/Time: 04/06/2023 12:37 PM  Performed by: Dairl Ponder, CRNAPre-anesthesia Checklist: Patient identified, Emergency Drugs available, Suction available and Patient being monitored Patient Re-evaluated:Patient Re-evaluated prior to induction Oxygen Delivery Method: Circle System Utilized Preoxygenation: Pre-oxygenation with 100% oxygen Induction Type: IV induction Ventilation: Mask ventilation without difficulty LMA: LMA inserted LMA Size: 4.0 Number of attempts: 1 Airway Equipment and Method: Bite block Placement Confirmation: positive ETCO2 Tube secured with: Tape Dental Injury: Teeth and Oropharynx as per pre-operative assessment

## 2023-04-06 NOTE — Progress Notes (Signed)
PROGRESS NOTE    Judy Carlson  EAV:[id number] DOB: [birth year] DOA: 04/05/2023 PCP: Tresa Garter, MD  Outpatient Specialists:     Brief Narrative:  Patient is a 73 year old female, past medical history significant for carotid artery disease status post CEA, hypertension, hyperlipidemia, CKD 3A, prediabetes, COPD, Raynaud's, mood disorder and former smoker.  Patient presented with right flank/right lower quadrant pain and persistent nausea and vomiting. Found to have distal right ureteral stone, 3 mm, possible infected stone. AKI suspected from dehydration.  UA revealed moderate hemoglobin, trace leukocyte and many bacteria.  Blood cultures pending.  Patient is on IV Rocephin.  04/06/2023: Patient seen.  Patient underwent cystoscopy with retrograde pyelogram, ureteroscopy and stent placement with stone basket extraction (right: Ureter) by the urology team.   Assessment & Plan:   Principal Problem:   Right ureteral stone Active Problems:   Chronic upper GI bleeding   Urinary tract infection   AKI (acute kidney injury) (HCC)   Distal right ureteral stone, 3 mm, possible infected stone Question complicated urinary tract infection. Right flank/right lower quadrant pain since 1/28 associated with nausea/vomiting.  No prior renal stones in the past.  Denies urinary changes.  UA is equivocal, see data above.  AKI suspected related to dehydration. CT abdomen pelvis with distal 3 mm right ureteral stone with mild ureteral and perinephric stranding.  - EDP consulted with urology -Patient has undergone cystoscopy and ureteral stone extraction with stent placement (right ureter). -Patient is currently on IV Rocephin. -Blood cultures are pending. -No urine cultures visualized.  Patient is already on antibiotics. -Urology input is highly appreciated.   AKI on CKD stage 3b: -Baseline creatinine 1.2, elevated to 2.2 on admission.   -AKI is likely multifactorial. -Ensure adequate  hydration. -Patient has undergone right ureteral stone extraction. -Continue antibiotics. -Continue to monitor renal function and electrolytes.     Chronic black stools, episode of blood-streaked emesis Question of duodenitis on imaging History of peptic ulcer disease, H. pylori treated Hemoglobin is 13; recent baseline actually lower 11-12.  Likely hemoconcentrated in the setting of volume depletion.  Last EGD in 5/' 22 with nonbleeding gastric ulcers, gastritis, duodenitis; pathology ended up positive for H. pylori which she reports was treated. - Continue pantoprazole 40 mg PO twice daily continue for 8 weeks - Follow-up with GI outpatient, suspect slow chronic upper GI bleeding. Will need EGD  - Fecal occult blood not visualized.     Chronic medical problems: Carotid artery disease, s/p CEA: Continue home Plavix, Zetia Hypertension: Continue home metoprolol.  Hold home amlodipine-valsartan in setting of low normal pressures and AKI Hyperlipidemia: Continue home Zetia Pre-DM: Glucose within goal range, can add SSI if runs high. COPD: Albuterol as needed Raynaud's Mood disorder: No longer on escitalopram     DVT prophylaxis:  Code Status: DNR Family Communication:  Disposition Plan: Patient remains inpatient.  Multiple severe comorbidities.   Consultants:  Urology  Procedures:  Cystoscopy, right ureteral stent placement and stone extraction  Antimicrobials:  IV Rocephin   Subjective: Patient seen. No new complaints.  Objective: Vitals:   04/06/23 0801 04/06/23 1134 04/06/23 1148 04/06/23 1151  BP: 110/70 106/62  109/74  Pulse: (!) 119 (!) 107  (!) 103  Resp: 18 18  20   Temp: 98.1 F (36.7 C)   98 F (36.7 C)  TempSrc:    Oral  SpO2: 99% 100%  95%  Weight:   79.4 kg   Height:   5\' 5"  (1.651 m)  No intake or output data in the 24 hours ending 04/06/23 1206 Filed Weights   04/05/23 1629 04/06/23 1148  Weight: 79.4 kg 79.4 kg    Examination:  General  exam: Appears calm and comfortable. HEENT: Patient is perlov. Respiratory system: Clear to auscultation.  Cardiovascular system: S1 & S2 Gastrointestinal system: Abdomen is soft.  Vague tenderness.  Patient does had a procedure (cystoscopy and ureteral stent placement).   Central nervous system: Alert and oriented.  .    Data Reviewed: I have personally reviewed following labs and imaging studies  CBC: Recent Labs  Lab 04/05/23 1648 04/06/23 0736  WBC 7.0 6.3  HGB 13.4 11.8*  HCT 41.7 36.1  MCV 84.1 83.4  PLT 435* 387   Basic Metabolic Panel: Recent Labs  Lab 04/05/23 1632 04/06/23 0736  NA 141 141  K 3.6 3.6  CL 107 106  CO2 20* 21*  GLUCOSE 121* 114*  BUN 21 24*  CREATININE 2.23* 2.34*  CALCIUM 9.8 9.4  MG  --  2.0  PHOS  --  4.4   GFR: Estimated Creatinine Clearance: 22.6 mL/min (A) (by C-G formula based on SCr of 2.34 mg/dL (H)). Liver Function Tests: Recent Labs  Lab 04/05/23 1632  AST 26  ALT 27  ALKPHOS 65  BILITOT 0.6  PROT 7.4  ALBUMIN 3.9   Recent Labs  Lab 04/05/23 1632  LIPASE 24   No results for input(s): "AMMONIA" in the last 168 hours. Coagulation Profile: Recent Labs  Lab 04/06/23 0736  INR 1.1   Cardiac Enzymes: No results for input(s): "CKTOTAL", "CKMB", "CKMBINDEX", "TROPONINI" in the last 168 hours. BNP (last 3 results) No results for input(s): "PROBNP" in the last 8760 hours. HbA1C: No results for input(s): "HGBA1C" in the last 72 hours. CBG: No results for input(s): "GLUCAP" in the last 168 hours. Lipid Profile: No results for input(s): "CHOL", "HDL", "LDLCALC", "TRIG", "CHOLHDL", "LDLDIRECT" in the last 72 hours. Thyroid Function Tests: No results for input(s): "TSH", "T4TOTAL", "FREET4", "T3FREE", "THYROIDAB" in the last 72 hours. Anemia Panel: No results for input(s): "VITAMINB12", "FOLATE", "FERRITIN", "TIBC", "IRON", "RETICCTPCT" in the last 72 hours. Urine analysis:    Component Value Date/Time   COLORURINE  YELLOW 04/06/2023 0724   APPEARANCEUR CLEAR 04/06/2023 0724   LABSPEC 1.025 04/06/2023 0724   PHURINE 5.5 04/06/2023 0724   GLUCOSEU NEGATIVE 04/06/2023 0724   GLUCOSEU NEGATIVE 12/14/2022 0944   HGBUR MODERATE (A) 04/06/2023 0724   BILIRUBINUR SMALL (A) 04/06/2023 0724   BILIRUBINUR negative 10/28/2017 1834   BILIRUBINUR clear 07/28/2014 0914   KETONESUR NEGATIVE 04/06/2023 0724   PROTEINUR NEGATIVE 04/06/2023 0724   UROBILINOGEN 0.2 12/14/2022 0944   NITRITE NEGATIVE 04/06/2023 0724   LEUKOCYTESUR TRACE (A) 04/06/2023 0724   Sepsis Labs: @LABRCNTIP (procalcitonin:4,lacticidven:4)  ) Recent Results (from the past 240 hours)  Blood culture (routine x 2)     Status: None (Preliminary result)   Collection Time: 04/05/23 10:30 PM   Specimen: BLOOD  Result Value Ref Range Status   Specimen Description BLOOD RIGHT ANTECUBITAL  Final   Special Requests   Final    BOTTLES DRAWN AEROBIC AND ANAEROBIC Blood Culture results may not be optimal due to an inadequate volume of blood received in culture bottles   Culture   Final    NO GROWTH < 12 HOURS Performed at [facility], [address], [place], [place] [address]    Report Status PENDING  Incomplete  Blood culture (routine x 2)  Status: None (Preliminary result)   Collection Time: 04/05/23 10:47 PM   Specimen: BLOOD  Result Value Ref Range Status   Specimen Description BLOOD LEFT ANTECUBITAL  Final   Special Requests   Final    BOTTLES DRAWN AEROBIC AND ANAEROBIC Blood Culture adequate volume   Culture   Final    NO GROWTH < 12 HOURS Performed at [facility], [address], [place], [place] [address]    Report Status PENDING  Incomplete         Radiology Studies: CT ABDOMEN PELVIS WO CONTRAST Result Date: 04/05/2023 CLINICAL DATA:  Abdominal pain, emesis, diarrhea.  Black stools. EXAM: CT ABDOMEN AND PELVIS WITHOUT CONTRAST TECHNIQUE: Multidetector CT imaging of the abdomen and pelvis was performed  following the standard protocol without IV contrast. RADIATION DOSE REDUCTION: This exam was performed according to the departmental dose-optimization program which includes automated exposure control, adjustment of the mA and/or kV according to patient size and/or use of iterative reconstruction technique. COMPARISON:  None Available. FINDINGS: Lower chest: No acute abnormality. Hepatobiliary: Unremarkable liver. Normal gallbladder. No biliary dilation. Pancreas: Unremarkable. Spleen: Unremarkable. Adrenals/Urinary Tract: Normal adrenal glands. A 3 mm stone is within the distal right ureter. Adjacent periureteral stranding about the mid and distal right ureter. No hydronephrosis. Trace asymmetric right perinephric edema. Bladder is unremarkable. Stomach/Bowel: Normal caliber large and small bowel. Question mild thickening of the wall of the duodenum with trace adjacent stranding. Colonic diverticulosis without diverticulitis. The appendix is not definitively seen. Vascular/Lymphatic: Aortic atherosclerosis. No enlarged abdominal or pelvic lymph nodes. Reproductive: Unremarkable. Other: No free intraperitoneal fluid or air. Musculoskeletal: No acute fracture. IMPRESSION: 1. A 3 mm stone within the distal right ureter. Mild right perinephric and periureteral stranding. No hydronephrosis. 2. Question mild thickening of the wall of the duodenum with trace adjacent stranding. This could be due to duodenitis. 3. Colonic diverticulosis without diverticulitis. Electronically Signed   By: Minerva Fester M.D.   On: 04/05/2023 20:00        Scheduled Meds:  [MAR Hold] clopidogrel  75 mg Oral Daily   [MAR Hold] ezetimibe  10 mg Oral Daily   [MAR Hold] metoprolol succinate  100 mg Oral Daily   [MAR Hold] pantoprazole  40 mg Oral BID   [MAR Hold] tamsulosin  0.4 mg Oral QPC supper   Continuous Infusions:  sodium chloride 10 mL/hr at 04/06/23 1157   acetaminophen     [MAR Hold] cefTRIAXone (ROCEPHIN)  IV        LOS: 1 day    Time spent: 35 minutes.    Berton Mount, MD  [facility] Pager #: [phone] 7PM-7AM contact night coverage as above

## 2023-04-06 NOTE — Discharge Instructions (Signed)
Ureteral stent-remove the stent by pulling the string gently on Tuesday morning, 04/09/2023

## 2023-04-07 ENCOUNTER — Encounter (HOSPITAL_COMMUNITY): Payer: Self-pay | Admitting: Urology

## 2023-04-07 DIAGNOSIS — N201 Calculus of ureter: Secondary | ICD-10-CM | POA: Diagnosis not present

## 2023-04-07 LAB — CBC WITH DIFFERENTIAL/PLATELET
Abs Immature Granulocytes: 0.01 10*3/uL (ref 0.00–0.07)
Basophils Absolute: 0 10*3/uL (ref 0.0–0.1)
Basophils Relative: 1 %
Eosinophils Absolute: 0.1 10*3/uL (ref 0.0–0.5)
Eosinophils Relative: 2 %
HCT: 31.5 % — ABNORMAL LOW (ref 36.0–46.0)
Hemoglobin: 10.1 g/dL — ABNORMAL LOW (ref 12.0–15.0)
Immature Granulocytes: 0 %
Lymphocytes Relative: 20 %
Lymphs Abs: 1.1 10*3/uL (ref 0.7–4.0)
MCH: 27.2 pg (ref 26.0–34.0)
MCHC: 32.1 g/dL (ref 30.0–36.0)
MCV: 84.9 fL (ref 80.0–100.0)
Monocytes Absolute: 0.8 10*3/uL (ref 0.1–1.0)
Monocytes Relative: 14 %
Neutro Abs: 3.4 10*3/uL (ref 1.7–7.7)
Neutrophils Relative %: 63 %
Platelets: 304 10*3/uL (ref 150–400)
RBC: 3.71 MIL/uL — ABNORMAL LOW (ref 3.87–5.11)
RDW: 16.8 % — ABNORMAL HIGH (ref 11.5–15.5)
WBC: 5.4 10*3/uL (ref 4.0–10.5)
nRBC: 0 % (ref 0.0–0.2)

## 2023-04-07 LAB — RENAL FUNCTION PANEL
Albumin: 3.1 g/dL — ABNORMAL LOW (ref 3.5–5.0)
Anion gap: 9 (ref 5–15)
BUN: 19 mg/dL (ref 8–23)
CO2: 21 mmol/L — ABNORMAL LOW (ref 22–32)
Calcium: 8.8 mg/dL — ABNORMAL LOW (ref 8.9–10.3)
Chloride: 110 mmol/L (ref 98–111)
Creatinine, Ser: 1.64 mg/dL — ABNORMAL HIGH (ref 0.44–1.00)
GFR, Estimated: 33 mL/min — ABNORMAL LOW (ref >60)
Glucose, Bld: 119 mg/dL — ABNORMAL HIGH (ref 70–99)
Phosphorus: 4.3 mg/dL (ref 2.5–4.6)
Potassium: 3.7 mmol/L (ref 3.5–5.1)
Sodium: 140 mmol/L (ref 135–145)

## 2023-04-07 LAB — MAGNESIUM: Magnesium: 2 mg/dL (ref 1.7–2.4)

## 2023-04-07 NOTE — Progress Notes (Signed)
PROGRESS NOTE    Judy Carlson  NWG:[id number] DOB: [birth year] DOA: 04/05/2023 PCP: Tresa Garter, MD  Outpatient Specialists:     Brief Narrative:  Patient is a 73 year old female, past medical history significant for carotid artery disease status post CEA, hypertension, hyperlipidemia, CKD 3A, prediabetes, COPD, Raynaud's, mood disorder and former smoker.  Patient presented with right flank/right lower quadrant pain and persistent nausea and vomiting. Found to have distal right ureteral stone, 3 mm, possible infected stone. AKI suspected from dehydration.  UA revealed moderate hemoglobin, trace leukocyte and many bacteria.  Blood cultures pending.  Patient is on IV Rocephin.  04/06/2023: Patient seen.  Patient underwent cystoscopy with retrograde pyelogram, ureteroscopy and stent placement with stone basket extraction (right: Ureter) by the urology team.  04/07/2023: Patient seen alongside patient's nurse.  Urology input is appreciated.  Urology has instructed patient to remove right ureteral stent on Tuesday.  Acute kidney injury is improving.  Cultures are still pending.  Will continue antibiotics for now.  Likely discharge back home tomorrow on oral antibiotics.   Assessment & Plan:   Principal Problem:   Right ureteral stone Active Problems:   Chronic upper GI bleeding   Urinary tract infection   AKI (acute kidney injury) (HCC)   Distal right ureteral stone, 3 mm, possible infected stone Question complicated urinary tract infection. Right flank/right lower quadrant pain since 1/28 associated with nausea/vomiting.  No prior renal stones in the past.  Denies urinary changes.  UA is equivocal, see data above.  AKI suspected related to dehydration. CT abdomen pelvis with distal 3 mm right ureteral stone with mild ureteral and perinephric stranding.  -Patient has undergone cystoscopy and ureteral stone extraction with stent placement (right ureter). -Patient is currently on IV  Rocephin. -Blood cultures are pending. -No urine cultures visualized.  Patient is already on antibiotics. -Urology input is highly appreciated.  Please see above documentation.   AKI on CKD stage 3b: -Baseline creatinine 1.2, elevated to 2.2 on admission.   -AKI is likely multifactorial. -Ensure adequate hydration. -Patient has undergone right ureteral stone extraction. -Continue antibiotics. -Serum creatinine has improved to 1.64 (from 2.34) -Continue to monitor renal function and electrolytes.     Chronic black stools, episode of blood-streaked emesis Question of duodenitis on imaging History of peptic ulcer disease, H. pylori treated Hemoglobin is 13; recent baseline actually lower 11-12.  Likely hemoconcentrated in the setting of volume depletion.  Last EGD in 5/' 22 with nonbleeding gastric ulcers, gastritis, duodenitis; pathology ended up positive for H. pylori which she reports was treated. - Continue pantoprazole 40 mg PO twice daily continue for 8 weeks - Follow-up with GI outpatient, suspect slow chronic upper GI bleeding. Will need EGD  - Fecal occult blood not visualized.     Chronic medical problems: Carotid artery disease, s/p CEA: Continue home Plavix, Zetia Hypertension: Continue home metoprolol.  Hold home amlodipine-valsartan in setting of low normal pressures and AKI Hyperlipidemia: Continue home Zetia Pre-DM: Glucose within goal range, can add SSI if runs high. COPD: Albuterol as needed Raynaud's Mood disorder: No longer on escitalopram     DVT prophylaxis:  Code Status: DNR Family Communication:  Disposition Plan: Patient remains inpatient.  Multiple severe comorbidities.   Consultants:  Urology  Procedures:  Cystoscopy, right ureteral stent placement and stone extraction  Antimicrobials:  IV Rocephin   Subjective: Patient seen. No new complaints.  Abdominal pain is slowly improving.  Objective: Vitals:   04/06/23 1532 04/06/23 2103 04/07/23  0500 04/07/23 0752  BP: 108/67 (!) 91/51 125/71 112/65  Pulse: 97 (!) 104 70 88  Resp: 17 18 18 18   Temp: 98 F (36.7 C) 98.7 F (37.1 C) 98 F (36.7 C) 98 F (36.7 C)  TempSrc: Oral Oral Oral   SpO2: 97% 95% 97% 98%  Weight:      Height:        Intake/Output Summary (Last 24 hours) at 04/07/2023 1413 Last data filed at 04/07/2023 1610 Gross per 24 hour  Intake 100 ml  Output --  Net 100 ml   Filed Weights   04/05/23 1629 04/06/23 1148  Weight: 79.4 kg 79.4 kg    Examination:  General exam: Appears calm and comfortable. HEENT: Patient is perlov. Respiratory system: Clear to auscultation.  Cardiovascular system: S1 & S2 Gastrointestinal system: Abdomen is soft.  Vague tenderness.  Patient does had a procedure (cystoscopy and ureteral stent placement).   Central nervous system: Alert and oriented.  .    Data Reviewed: I have personally reviewed following labs and imaging studies  CBC: Recent Labs  Lab 04/05/23 1648 04/06/23 0736 04/07/23 0736  WBC 7.0 6.3 5.4  NEUTROABS  --   --  3.4  HGB 13.4 11.8* 10.1*  HCT 41.7 36.1 31.5*  MCV 84.1 83.4 84.9  PLT 435* 387 304   Basic Metabolic Panel: Recent Labs  Lab 04/05/23 1632 04/06/23 0736 04/07/23 0736  NA 141 141 140  K 3.6 3.6 3.7  CL 107 106 110  CO2 20* 21* 21*  GLUCOSE 121* 114* 119*  BUN 21 24* 19  CREATININE 2.23* 2.34* 1.64*  CALCIUM 9.8 9.4 8.8*  MG  --  2.0 2.0  PHOS  --  4.4 4.3   GFR: Estimated Creatinine Clearance: 32.3 mL/min (A) (by C-G formula based on SCr of 1.64 mg/dL (H)). Liver Function Tests: Recent Labs  Lab 04/05/23 1632 04/07/23 0736  AST 26  --   ALT 27  --   ALKPHOS 65  --   BILITOT 0.6  --   PROT 7.4  --   ALBUMIN 3.9 3.1*   Recent Labs  Lab 04/05/23 1632  LIPASE 24   No results for input(s): "AMMONIA" in the last 168 hours. Coagulation Profile: Recent Labs  Lab 04/06/23 0736  INR 1.1   Cardiac Enzymes: No results for input(s): "CKTOTAL", "CKMB",  "CKMBINDEX", "TROPONINI" in the last 168 hours. BNP (last 3 results) No results for input(s): "PROBNP" in the last 8760 hours. HbA1C: No results for input(s): "HGBA1C" in the last 72 hours. CBG: No results for input(s): "GLUCAP" in the last 168 hours. Lipid Profile: No results for input(s): "CHOL", "HDL", "LDLCALC", "TRIG", "CHOLHDL", "LDLDIRECT" in the last 72 hours. Thyroid Function Tests: No results for input(s): "TSH", "T4TOTAL", "FREET4", "T3FREE", "THYROIDAB" in the last 72 hours. Anemia Panel: No results for input(s): "VITAMINB12", "FOLATE", "FERRITIN", "TIBC", "IRON", "RETICCTPCT" in the last 72 hours. Urine analysis:    Component Value Date/Time   COLORURINE YELLOW 04/06/2023 0724   APPEARANCEUR CLEAR 04/06/2023 0724   LABSPEC 1.025 04/06/2023 0724   PHURINE 5.5 04/06/2023 0724   GLUCOSEU NEGATIVE 04/06/2023 0724   GLUCOSEU NEGATIVE 12/14/2022 0944   HGBUR MODERATE (A) 04/06/2023 0724   BILIRUBINUR SMALL (A) 04/06/2023 0724   BILIRUBINUR negative 10/28/2017 1834   BILIRUBINUR clear 07/28/2014 0914   KETONESUR NEGATIVE 04/06/2023 0724   PROTEINUR NEGATIVE 04/06/2023 0724   UROBILINOGEN 0.2 12/14/2022 0944   NITRITE NEGATIVE 04/06/2023 0724   LEUKOCYTESUR TRACE (A) 04/06/2023  1610   Sepsis Labs: @LABRCNTIP (procalcitonin:4,lacticidven:4)  ) Recent Results (from the past 240 hours)  Blood culture (routine x 2)     Status: None (Preliminary result)   Collection Time: 04/05/23 10:30 PM   Specimen: BLOOD  Result Value Ref Range Status   Specimen Description BLOOD RIGHT ANTECUBITAL  Final   Special Requests   Final    BOTTLES DRAWN AEROBIC AND ANAEROBIC Blood Culture results may not be optimal due to an inadequate volume of blood received in culture bottles   Culture   Final    NO GROWTH 2 DAYS Performed at [facility], [address], [place], [place] [address]    Report Status PENDING  Incomplete  Blood culture (routine x 2)     Status: None (Preliminary  result)   Collection Time: 04/05/23 10:47 PM   Specimen: BLOOD  Result Value Ref Range Status   Specimen Description BLOOD LEFT ANTECUBITAL  Final   Special Requests   Final    BOTTLES DRAWN AEROBIC AND ANAEROBIC Blood Culture adequate volume   Culture   Final    NO GROWTH 2 DAYS Performed at [facility], [address], [place], [place] [address]    Report Status PENDING  Incomplete         Radiology Studies: DG C-Arm 1-60 Min Result Date: 04/06/2023 CLINICAL DATA:  Cystoscopy with right retrograde pyelogram, stent placement and stone basket extraction. EXAM: DG C-ARM 1-60 MIN FLUOROSCOPY: Fluoroscopy Time: Radiation Exposure Index (if provided by the fluoroscopic device): 4.62 mGy Number of Acquired Spot Images: 0 COMPARISON:  CT abdomen pelvis 04/05/2023. FINDINGS: 4 intraoperative fluoroscopic spot views of a retrograde pyelography are submitted. Laterality is not included on the images. Per the provided clinical history, this is a right-sided procedure. Images show deployment of a double-J right ureteral stent with the proximal loop in the expected location of the renal pelvis and distal loop formed in the bladder. IMPRESSION: Intraoperative visualization for ureteral stent deployment and per the provided clinical history, on the right side. Electronically Signed   By: Leanna Battles M.D.   On: 04/06/2023 17:12   CT ABDOMEN PELVIS WO CONTRAST Result Date: 04/05/2023 CLINICAL DATA:  Abdominal pain, emesis, diarrhea.  Black stools. EXAM: CT ABDOMEN AND PELVIS WITHOUT CONTRAST TECHNIQUE: Multidetector CT imaging of the abdomen and pelvis was performed following the standard protocol without IV contrast. RADIATION DOSE REDUCTION: This exam was performed according to the departmental dose-optimization program which includes automated exposure control, adjustment of the mA and/or kV according to patient size and/or use of iterative reconstruction technique. COMPARISON:  None Available.  FINDINGS: Lower chest: No acute abnormality. Hepatobiliary: Unremarkable liver. Normal gallbladder. No biliary dilation. Pancreas: Unremarkable. Spleen: Unremarkable. Adrenals/Urinary Tract: Normal adrenal glands. A 3 mm stone is within the distal right ureter. Adjacent periureteral stranding about the mid and distal right ureter. No hydronephrosis. Trace asymmetric right perinephric edema. Bladder is unremarkable. Stomach/Bowel: Normal caliber large and small bowel. Question mild thickening of the wall of the duodenum with trace adjacent stranding. Colonic diverticulosis without diverticulitis. The appendix is not definitively seen. Vascular/Lymphatic: Aortic atherosclerosis. No enlarged abdominal or pelvic lymph nodes. Reproductive: Unremarkable. Other: No free intraperitoneal fluid or air. Musculoskeletal: No acute fracture. IMPRESSION: 1. A 3 mm stone within the distal right ureter. Mild right perinephric and periureteral stranding. No hydronephrosis. 2. Question mild thickening of the wall of the duodenum with trace adjacent stranding. This could be due to duodenitis. 3. Colonic diverticulosis without diverticulitis.  Electronically Signed   By: Minerva Fester M.D.   On: 04/05/2023 20:00        Scheduled Meds:  clopidogrel  75 mg Oral Daily   ezetimibe  10 mg Oral Daily   metoprolol succinate  100 mg Oral Daily   pantoprazole  40 mg Oral BID   tamsulosin  0.4 mg Oral QPC supper   Continuous Infusions:  cefTRIAXone (ROCEPHIN)  IV Stopped (04/06/23 2227)     LOS: 2 days    Time spent: 35 minutes.    Berton Mount, MD  [facility] Pager #: [phone] 7PM-7AM contact night coverage as above

## 2023-04-07 NOTE — Progress Notes (Signed)
1 Day Post-Op Subjective: Pt doing well. No complaints. Voiding well.   POD1 right stone removal and stent placement.  1 soft BP at 9 pm, otherwise stable.  Bcx negative.   Cr down to 1.64 from 2.34.  Objective: Vital signs in last 24 hours: Temp:  [98 F (36.7 C)-98.7 F (37.1 C)] 98 F (36.7 C) (02/02 0752) Pulse Rate:  [70-110] 88 (02/02 0752) Resp:  [12-20] 18 (02/02 0752) BP: (91-125)/(51-74) 112/65 (02/02 0752) SpO2:  [93 %-100 %] 98 % (02/02 0752) Weight:  [79.4 kg] 79.4 kg (02/01 1148)  Assessment/Plan: #Right distal stone -s/p cystourethroscopy, right ureteroscopy and removal right ureteral stone and right ureteral stent placement.  -continue stent until Tuesday. Pt can remove at home. We discussed how to remove today. Please discharge with a dose antibiotics on the day of stent removal.  -follow up in 6 weeks with urology with a renal ultrasound. We will request her follow up.  -we will sign off today.   Intake/Output from previous day: 02/01 0701 - 02/02 0700 In: 850 [I.V.:500; IV Piggyback:350] Out: -   Intake/Output this shift: No intake/output data recorded.  Physical Exam:  General: Alert and oriented CV: No cyanosis Lungs: equal chest rise Abdomen: Soft, NTND, no rebound or guarding Skin: Gu: voiding spontaneously. No CVA tenderness.   Lab Results: Recent Labs    04/05/23 1648 04/06/23 0736 04/07/23 0736  HGB 13.4 11.8* 10.1*  HCT 41.7 36.1 31.5*   BMET Recent Labs    04/06/23 0736 04/07/23 0736  NA 141 140  K 3.6 3.7  CL 106 110  CO2 21* 21*  GLUCOSE 114* 119*  BUN 24* 19  CREATININE 2.34* 1.64*  CALCIUM 9.4 8.8*     Studies/Results: DG C-Arm 1-60 Min Result Date: 04/06/2023 CLINICAL DATA:  Cystoscopy with right retrograde pyelogram, stent placement and stone basket extraction. EXAM: DG C-ARM 1-60 MIN FLUOROSCOPY: Fluoroscopy Time: Radiation Exposure Index (if provided by the fluoroscopic device): 4.62 mGy Number of Acquired  Spot Images: 0 COMPARISON:  CT abdomen pelvis 04/05/2023. FINDINGS: 4 intraoperative fluoroscopic spot views of a retrograde pyelography are submitted. Laterality is not included on the images. Per the provided clinical history, this is a right-sided procedure. Images show deployment of a double-J right ureteral stent with the proximal loop in the expected location of the renal pelvis and distal loop formed in the bladder. IMPRESSION: Intraoperative visualization for ureteral stent deployment and per the provided clinical history, on the right side. Electronically Signed   By: Leanna Battles M.D.   On: 04/06/2023 17:12   CT ABDOMEN PELVIS WO CONTRAST Result Date: 04/05/2023 CLINICAL DATA:  Abdominal pain, emesis, diarrhea.  Black stools. EXAM: CT ABDOMEN AND PELVIS WITHOUT CONTRAST TECHNIQUE: Multidetector CT imaging of the abdomen and pelvis was performed following the standard protocol without IV contrast. RADIATION DOSE REDUCTION: This exam was performed according to the departmental dose-optimization program which includes automated exposure control, adjustment of the mA and/or kV according to patient size and/or use of iterative reconstruction technique. COMPARISON:  None Available. FINDINGS: Lower chest: No acute abnormality. Hepatobiliary: Unremarkable liver. Normal gallbladder. No biliary dilation. Pancreas: Unremarkable. Spleen: Unremarkable. Adrenals/Urinary Tract: Normal adrenal glands. A 3 mm stone is within the distal right ureter. Adjacent periureteral stranding about the mid and distal right ureter. No hydronephrosis. Trace asymmetric right perinephric edema. Bladder is unremarkable. Stomach/Bowel: Normal caliber large and small bowel. Question mild thickening of the wall of the duodenum with trace adjacent stranding. Colonic diverticulosis without  diverticulitis. The appendix is not definitively seen. Vascular/Lymphatic: Aortic atherosclerosis. No enlarged abdominal or pelvic lymph nodes.  Reproductive: Unremarkable. Other: No free intraperitoneal fluid or air. Musculoskeletal: No acute fracture. IMPRESSION: 1. A 3 mm stone within the distal right ureter. Mild right perinephric and periureteral stranding. No hydronephrosis. 2. Question mild thickening of the wall of the duodenum with trace adjacent stranding. This could be due to duodenitis. 3. Colonic diverticulosis without diverticulitis. Electronically Signed   By: Minerva Fester M.D.   On: 04/05/2023 20:00      LOS: 2 days   Jerald Kief, MD, PhD [facility] Resident  [facility]    04/07/2023, 9:18 AM

## 2023-04-07 NOTE — Plan of Care (Signed)

## 2023-04-08 LAB — RENAL FUNCTION PANEL
Albumin: 3.2 g/dL — ABNORMAL LOW (ref 3.5–5.0)
Anion gap: 11 (ref 5–15)
BUN: 13 mg/dL (ref 8–23)
CO2: 21 mmol/L — ABNORMAL LOW (ref 22–32)
Calcium: 9.2 mg/dL (ref 8.9–10.3)
Chloride: 110 mmol/L (ref 98–111)
Creatinine, Ser: 1.36 mg/dL — ABNORMAL HIGH (ref 0.44–1.00)
GFR, Estimated: 41 mL/min — ABNORMAL LOW (ref >60)
Glucose, Bld: 99 mg/dL (ref 70–99)
Phosphorus: 3.6 mg/dL (ref 2.5–4.6)
Potassium: 3.8 mmol/L (ref 3.5–5.1)
Sodium: 142 mmol/L (ref 135–145)

## 2023-04-08 MED ORDER — CEPHALEXIN 500 MG PO CAPS: 500.0000 mg | ORAL_CAPSULE | Freq: Four times a day (QID) | ORAL | 0 refills | Status: AC

## 2023-04-08 MED ORDER — VALSARTAN 80 MG PO TABS: 80.0000 mg | ORAL_TABLET | Freq: Every day | ORAL | 1 refills | Status: DC

## 2023-04-08 NOTE — Discharge Summary (Signed)
 Physician Discharge Summary  Patient ID: RANIA PROTHERO MRN: [id number] DOB/AGE: 08-22-1950 73 y.o.  Admit date: 04/05/2023 Discharge date: 04/08/2023  Admission Diagnoses:  Discharge Diagnoses:  Principal Problem:   Right ureteral stone Active Problems:   Chronic upper GI bleeding   Urinary tract infection   AKI (acute kidney injury) [facility])   Discharged Condition: stable  Hospital Course:  Patient is a 73 year old female, with past medical history significant for carotid artery disease status post CEA, hypertension, hyperlipidemia, CKD 3A, prediabetes, COPD, Raynaud's, mood disorder and former smoker.  Patient presented with right flank/right lower quadrant pain and persistent nausea and vomiting.  Patient was found to have distal right ureteral stone, 3 mm, possible infected stone. AKI suspected to be secondary to dehydration. UA revealed moderate hemoglobin, trace leukocyte and many bacteria.  Blood cultures were pending pending.  Patient also received IV Rocephin.  Urology team assisted in directing patient's care.  Patient underwent cystoscopy with retrograde pyelogram, ureteroscopy and stent placement with stone basket extraction (right: Ureter) by the urology team.  Urology team has cleared patient for discharge.  Urology has instructed patient to remove right ureteral stent on discharge.  Acute kidney injury has improved.  Patient will be discharged back home on oral antibiotics.  Patient will follow-up with primary care provider and urology team on discharge.    Distal right ureteral stone, 3 mm, possible infected stone Question complicated urinary tract infection. Right flank/right lower quadrant pain since 1/28 associated with nausea/vomiting.  No prior renal stones in the past.  Denies urinary changes.  UA is equivocal, see data above.  AKI suspected related to dehydration. CT abdomen pelvis with distal 3 mm right ureteral stone with mild ureteral and perinephric stranding.   -Patient has undergone cystoscopy and ureteral stone extraction with stent placement (right ureter). -Patient was treated with IV Rocephin.  Patient will be discharged on oral antibiotics.   -Blood cultures are pending. -No urine cultures visualized.   -Patient will follow-up with urology on discharge.   AKI on CKD stage 3b: -Baseline creatinine 1.2, elevated to 2.2 on admission.   -AKI is likely multifactorial. -Ensure adequate hydration. -Patient has undergone right ureteral stone extraction. -Continue antibiotics. -Serum creatinine has improved to 1.64 (from 2.34) -Continue to monitor renal function and electrolytes.     Chronic black stools, episode of blood-streaked emesis Question of duodenitis on imaging History of peptic ulcer disease, H. pylori treated Hemoglobin is 13; recent baseline actually lower 11-12.  Likely hemoconcentrated in the setting of volume depletion.  Last EGD in 5/' 22 with nonbleeding gastric ulcers, gastritis, duodenitis; pathology ended up positive for H. pylori which she reports was treated. - Continue pantoprazole 40 mg PO twice daily continue for 8 weeks - Follow-up with GI outpatient, suspect slow chronic upper GI bleeding. Will need EGD  - Fecal occult blood not visualized.     Chronic medical problems: Carotid artery disease, s/p CEA: Continue home Plavix, Zetia Hypertension: Continue home metoprolol.  Hold home amlodipine-valsartan in setting of low normal pressures and AKI Hyperlipidemia: Continue home Zetia Pre-DM: Glucose within goal range, can add SSI if runs high. COPD: Albuterol as needed Raynaud's Mood disorder: No longer on escitalopram    Consults: urology  Significant Diagnostic Studies:  -Cystoscopy with right retrograde pyelogram, right ureteroscopy stone basket extraction and right ureteral stent removal    CT ABDOMEN AND PELVIS WITHOUT CONTRAST:   TECHNIQUE: Multidetector CT imaging of the abdomen and pelvis was  performed following the standard  protocol without IV contrast.   RADIATION DOSE REDUCTION: This exam was performed according to the departmental dose-optimization program which includes automated exposure control, adjustment of the mA and/or kV according to patient size and/or use of iterative reconstruction technique.   COMPARISON:  None Available.   FINDINGS: Lower chest: No acute abnormality.   Hepatobiliary: Unremarkable liver. Normal gallbladder. No biliary dilation.   Pancreas: Unremarkable.   Spleen: Unremarkable.   Adrenals/Urinary Tract: Normal adrenal glands. A 3 mm stone is within the distal right ureter. Adjacent periureteral stranding about the mid and distal right ureter. No hydronephrosis. Trace asymmetric right perinephric edema. Bladder is unremarkable.   Stomach/Bowel: Normal caliber large and small bowel. Question mild thickening of the wall of the duodenum with trace adjacent stranding. Colonic diverticulosis without diverticulitis. The appendix is not definitively seen.   Vascular/Lymphatic: Aortic atherosclerosis. No enlarged abdominal or pelvic lymph nodes.   Reproductive: Unremarkable.   Other: No free intraperitoneal fluid or air.   Musculoskeletal: No acute fracture.   IMPRESSION: 1. A 3 mm stone within the distal right ureter. Mild right perinephric and periureteral stranding. No hydronephrosis. 2. Question mild thickening of the wall of the duodenum with trace adjacent stranding. This could be due to duodenitis. 3. Colonic diverticulosis without diverticulitis.     Electronically Signed   By: Minerva Fester M.D.   On: 04/05/2023 20:00    Discharge Exam: Blood pressure 128/86, pulse 90, temperature 98.2 F (36.8 C), temperature source Oral, resp. rate 18, height 5\' 5"  (1.651 m), weight 79.4 kg, SpO2 100%.   Disposition: Discharge disposition: 01-Home or Self Care       Discharge Instructions     Diet - low sodium heart  healthy   Complete by: As directed    Increase activity slowly   Complete by: As directed       Allergies as of 04/08/2023       Reactions   Crestor [rosuvastatin Calcium] Other (See Comments)   myalgias   Doxycycline Other (See Comments)   Unknown reaction   Lipitor [atorvastatin] Other (See Comments)   Myalgias/feet pain   Lisinopril Swelling, Other (See Comments)   Tongue swelling; facial swelling I.e. looks like she was hit in the face.   Naproxen Other (See Comments)   Advised not to take due to BP meds   Nsaids Other (See Comments)   Pt advised not to take due to blood pressure medications    Pravastatin Other (See Comments)   Myalgias.   Prednisone Itching, Swelling, Other (See Comments)   Toe swelling    Amoxicillin Anxiety, Cough   Clindamycin/lincomycin Rash   Lovastatin Hives, Rash, Other (See Comments)   Pt reports issues with her veins.        Medication List     STOP taking these medications    amLODipine-valsartan 5-160 MG tablet Commonly known as: EXFORGE   furosemide 40 MG tablet Commonly known as: LASIX   potassium chloride 10 MEQ tablet Commonly known as: KLOR-CON       TAKE these medications    acetaminophen 500 MG tablet Commonly known as: TYLENOL Take 1,000 mg by mouth as needed for mild pain (pain score 1-3), headache or fever.   cephALEXin 500 MG capsule Commonly known as: KEFLEX Take 1 capsule (500 mg total) by mouth 4 (four) times daily for 5 days.   clopidogrel 75 MG tablet Commonly known as: PLAVIX TAKE 1 TABLET EVERY DAY   escitalopram 5 MG tablet Commonly known as:  LEXAPRO Take 5 mg by mouth daily.   ezetimibe 10 MG tablet Commonly known as: ZETIA TAKE 1 TABLET EVERY DAY   metoprolol succinate 100 MG 24 hr tablet Commonly known as: TOPROL-XL TAKE 1 TABLET DAILY WITH OR IMMEDIATELY FOLLOWING A MEAL.   multivitamin tablet Take 1 tablet by mouth daily.   PATADAY OP Place 1 drop into both eyes daily as needed  (For dry eyes).   valsartan 80 MG tablet Commonly known as: Diovan Take 1 tablet (80 mg total) by mouth daily.   Vitamin D3 50 MCG (2000 UT) capsule TAKE 1 CAPSULE (2,000 UNITS TOTAL) BY MOUTH DAILY.        Follow-up Information     Jerilee Field, MD. Call.   Specialty: Urology Why: You will need to be seen in about 6 weeks with an ultrasound of your kidney to make sure everything healed up properly. Contact information: [address] [place] [address] [phone]                Time spent: 35 minutes.  SignedBarnetta Chapel 04/08/2023, 11:05 AM

## 2023-04-08 NOTE — Plan of Care (Signed)

## 2023-04-08 NOTE — Care Management Important Message (Signed)
Important Message  Patient Details  Name: Judy Carlson MRN: [id number] Date of Birth: 1950-03-23   Important Message Given:  Yes - Medicare IM   Patient left prior to IM delivery will mail to the patient home address.  Orvie Caradine 04/08/2023, 3:32 PM

## 2023-04-08 NOTE — Progress Notes (Incomplete)
DISCHARGE NOTE HOME Judy Carlson to be discharged Home per MD order. Discussed prescriptions and follow up appointments with the patient. Prescriptions given to patient; medication list explained in detail. Patient verbalized understanding.  Skin clean, dry and intact without evidence of skin break down, no evidence of skin tears noted. IV catheter discontinued intact. Site without signs and symptoms of complications. Dressing and pressure applied. Pt denies pain at the site currently. No complaints noted.  Patient free of lines, drains, and wounds.   An After Visit Summary (AVS) was printed and given to the patient. Patient escorted via wheelchair, and discharged home via private auto.  [name], RN

## 2023-04-09 ENCOUNTER — Telehealth: Payer: Self-pay | Admitting: *Deleted

## 2023-04-09 NOTE — Transitions of Care (Post Inpatient/ED Visit) (Addendum)
 04/09/2023  Name: Judy Carlson MRN: [id number] DOB: 24-Aug-1950  Today's TOC FU Call Status: Today's TOC FU Call Status:: Successful TOC FU Call Completed TOC FU Call Complete Date: 04/09/23 Patient's Name and Date of Birth confirmed.  Transition Care Management Follow-up Telephone Call Date of Discharge: 04/08/23 Discharge Facility: [facility]) Type of Discharge: Inpatient Admission Primary Inpatient Discharge Diagnosis:: AKI; (R) uretal kidney stone with stent placement via cystoscopy How have you been since you were released from the hospital?: Better (I am doing fine; pulling the stent out this morning was a little uncomfortable, but it came out like they said it would.  I have called Dr. Nieves for a follow appointment in 6 weeks like he recommended) Any questions or concerns?: No  Items Reviewed: Did you receive and understand the discharge instructions provided?: Yes (thoroughly reviewed with patient who verbalizes good understanding of same) Medications obtained,verified, and reconciled?: Yes (Medications Reviewed) (Full medication reconciliation/ review completed; no concerns or discrepancies identified; confirmed patient obtained/ is taking all newly Rx'd medications as instructed; self-manages medications and denies questions/ concerns around medications today) Any new allergies since your discharge?: No Dietary orders reviewed?: Yes Type of Diet Ordered:: Healthy as I can Do you have support at home?: Yes People in Home: alone Name of Support/Comfort Primary Source: Reports independent in self-care activities; resides alone; extended family assists as/ if needed/ indicated  Medications Reviewed Today: Medications Reviewed Today     Reviewed by Daymion Nazaire M, RN (Registered Nurse) on 04/09/23 at 1239  Med List Status: <None>   Medication Order Taking? Sig Documenting Provider Last Dose Status Informant  acetaminophen  (TYLENOL ) 500 MG tablet [id number] Yes Take  1,000 mg by mouth as needed for mild pain (pain score 1-3), headache or fever. [provider] Taking Active Self, Pharmacy Records  cephALEXin  (KEFLEX ) 500 MG capsule [id number] Yes Take 1 capsule (500 mg total) by mouth 4 (four) times daily for 5 days. Rosario Leatrice FERNS, MD Taking Active   Cholecalciferol  (VITAMIN D3) 50 MCG (2000 UT) capsule [id number] Yes TAKE 1 CAPSULE (2,000 UNITS TOTAL) BY MOUTH DAILY. Plotnikov, Karlynn GAILS, MD Taking Active Self, Pharmacy Records  clopidogrel  (PLAVIX ) 75 MG tablet [id number] Yes TAKE 1 TABLET EVERY DAY Magda Debby SAILOR, MD Taking Active Self, Pharmacy Records           Med Note (LEE, NICOLE   Fri Apr 05, 2023 10:00 PM) Last dose 04/02/2023@noon   escitalopram  (LEXAPRO ) 5 MG tablet [id number] No Take 5 mg by mouth daily.  Patient not taking: Reported on 04/09/2023   [provider] Not Taking Active Self, Pharmacy Records           Med Note (Quartez Lagos M   Tue Apr 09, 2023 12:23 PM) 04/09/23: Reports during [facility] call she is no longer taking   ezetimibe  (ZETIA ) 10 MG tablet [id number] Yes TAKE 1 TABLET EVERY DAY Plotnikov, Aleksei V, MD Taking Active Self, Pharmacy Records  metoprolol  succinate (TOPROL -XL) 100 MG 24 hr tablet [id number] Yes TAKE 1 TABLET DAILY WITH OR IMMEDIATELY FOLLOWING A MEAL. Plotnikov, Aleksei V, MD Taking Active Self, Pharmacy Records  Multiple Vitamin (MULTIVITAMIN) tablet [id number] Yes Take 1 tablet by mouth daily. [provider] Taking Active Self, Pharmacy Records  Olopatadine  HCl (PATADAY  OP) [id number] Yes Place 1 drop into both eyes daily as needed (For dry eyes). [provider] Taking Active Self, Pharmacy Records           Med Note (LEE, NICOLE  Fri Apr 05, 2023 10:02 PM) Last dose unknown-takes during allergy season  valsartan  (DIOVAN ) 80 MG tablet [id number] Yes Take 1 tablet (80 mg total) by mouth daily. Rosario Leatrice FERNS, MD Taking Active            Home Care and  Equipment/Supplies: Were Home Health Services Ordered?: No Any new equipment or medical supplies ordered?: No  Functional Questionnaire: Do you need assistance with bathing/showering or dressing?: No Do you need assistance with meal preparation?: No Do you need assistance with eating?: No Do you have difficulty maintaining continence: No Do you need assistance with getting out of bed/getting out of a chair/moving?: No Do you have difficulty managing or taking your medications?: No  Follow up appointments reviewed: PCP Follow-up appointment confirmed?: NA (verified not indicated per hospital discharging provider discharge notes; confirmed patient has routine PCP follow up visit on 06/12/23- she does not wish to schedule sooner with PCP when I offered to facilitate) Specialist Hospital Follow-up appointment confirmed?: No Reason Specialist Follow-Up Not Confirmed: Patient has Specialist Provider Number and will Call for Appointment (confirmed patient has called urology provider and is awaiting call back to schedule: verified appointment is not recommended until 6 weeks post-hospital discharge) Do you need transportation to your follow-up appointment?: No Do you understand care options if your condition(s) worsen?: Yes-patient verbalized understanding  SDOH Interventions Today    Flowsheet Row Most Recent Value  SDOH Interventions   Food Insecurity Interventions Intervention Not Indicated  Housing Interventions Intervention Not Indicated  Transportation Interventions Intervention Not Indicated  [reports drives self]  Utilities Interventions Intervention Not Indicated      Interventions Today    Flowsheet Row Most Recent Value  Chronic Disease   Chronic disease during today's visit Other  [(R) uretal stone with stent placement]  General Interventions   General Interventions Discussed/Reviewed General Interventions Discussed, Durable Medical Equipment (DME), Doctor Visits  Doctor  Visits Discussed/Reviewed PCP, Specialist, Doctor Visits Discussed  Durable Medical Equipment (DME) Other  [confirmed not currently requiring/ using assistive devices for ambulation]  PCP/Specialist Visits Compliance with follow-up visit  Education Interventions   Education Provided Provided Education  Provided Verbal Education On When to see the doctor, Other  [Reviewed post-cystoscopy/ uretal stent placement instructions thoroughly with patient: advised that she contact urology provider if she additional/ specific questions post- stent placement,  confirmed she has contact information for urology provider]  Nutrition Interventions   Nutrition Discussed/Reviewed Nutrition Discussed  Pharmacy Interventions   Pharmacy Dicussed/Reviewed Pharmacy Topics Discussed  [Full medication review with updating medication list in EHR per patient report]       TOC Interventions Today    Flowsheet Row Most Recent Value  TOC Interventions   TOC Interventions Discussed/Reviewed TOC Interventions Discussed  [Patient declines need for ongoing/ further care management outreach,  declines enrollment in 30-day TOC program,  provided my direct contact information should questions/ concerns/ needs arise post-TOC call]      Total time spent from review to signing of note/ including any care coordination interventions:  47 minutes  Kaiyah Eber Mckinney Barbaraann Avans, RN, BSN, Media Planner  Transitions of Care  [facility]  [place] [phone]: direct office

## 2023-04-10 LAB — CULTURE, BLOOD (ROUTINE X 2)
Culture: NO GROWTH
Culture: NO GROWTH
Special Requests: ADEQUATE

## 2023-04-11 DIAGNOSIS — N3000 Acute cystitis without hematuria: Secondary | ICD-10-CM | POA: Diagnosis not present

## 2023-04-11 DIAGNOSIS — N201 Calculus of ureter: Secondary | ICD-10-CM | POA: Diagnosis not present

## 2023-04-16 ENCOUNTER — Encounter: Payer: Self-pay | Admitting: Internal Medicine

## 2023-04-16 ENCOUNTER — Ambulatory Visit (INDEPENDENT_AMBULATORY_CARE_PROVIDER_SITE_OTHER): Payer: No Typology Code available for payment source | Admitting: Internal Medicine

## 2023-04-16 VITALS — BP 160/108 | HR 60 | Temp 98.6°F | Ht 65.0 in | Wt 164.0 lb

## 2023-04-16 DIAGNOSIS — R12 Heartburn: Secondary | ICD-10-CM | POA: Diagnosis not present

## 2023-04-16 DIAGNOSIS — N200 Calculus of kidney: Secondary | ICD-10-CM

## 2023-04-16 DIAGNOSIS — K921 Melena: Secondary | ICD-10-CM | POA: Diagnosis not present

## 2023-04-16 DIAGNOSIS — D508 Other iron deficiency anemias: Secondary | ICD-10-CM | POA: Diagnosis not present

## 2023-04-16 LAB — URINALYSIS, ROUTINE W REFLEX MICROSCOPIC
Hgb urine dipstick: NEGATIVE
Nitrite: NEGATIVE
RBC / HPF: NONE SEEN (ref 0–?)
Specific Gravity, Urine: 1.02 (ref 1.000–1.030)
Total Protein, Urine: 30 — AB
Urine Glucose: NEGATIVE
Urobilinogen, UA: 0.2 (ref 0.0–1.0)
pH: 6 (ref 5.0–8.0)

## 2023-04-16 LAB — CBC WITH DIFFERENTIAL/PLATELET
Basophils Absolute: 0 10*3/uL (ref 0.0–0.1)
Basophils Relative: 0.9 % (ref 0.0–3.0)
Eosinophils Absolute: 0.1 10*3/uL (ref 0.0–0.7)
Eosinophils Relative: 2.7 % (ref 0.0–5.0)
HCT: 39.2 % (ref 36.0–46.0)
Hemoglobin: 12.5 g/dL (ref 12.0–15.0)
Lymphocytes Relative: 29.9 % (ref 12.0–46.0)
Lymphs Abs: 1.5 10*3/uL (ref 0.7–4.0)
MCHC: 32 g/dL (ref 30.0–36.0)
MCV: 85.1 fL (ref 78.0–100.0)
Monocytes Absolute: 0.7 10*3/uL (ref 0.1–1.0)
Monocytes Relative: 13.3 % — ABNORMAL HIGH (ref 3.0–12.0)
Neutro Abs: 2.7 10*3/uL (ref 1.4–7.7)
Neutrophils Relative %: 53.2 % (ref 43.0–77.0)
Platelets: 361 10*3/uL (ref 150.0–400.0)
RBC: 4.6 Mil/uL (ref 3.87–5.11)
RDW: 17.1 % — ABNORMAL HIGH (ref 11.5–15.5)
WBC: 5.1 10*3/uL (ref 4.0–10.5)

## 2023-04-16 LAB — COMPREHENSIVE METABOLIC PANEL
ALT: 24 U/L (ref 0–35)
AST: 23 U/L (ref 0–37)
Albumin: 4.4 g/dL (ref 3.5–5.2)
Alkaline Phosphatase: 76 U/L (ref 39–117)
BUN: 13 mg/dL (ref 6–23)
CO2: 27 meq/L (ref 19–32)
Calcium: 10.5 mg/dL (ref 8.4–10.5)
Chloride: 104 meq/L (ref 96–112)
Creatinine, Ser: 1.12 mg/dL (ref 0.40–1.20)
GFR: 49.03 mL/min — ABNORMAL LOW (ref >60.00)
Glucose, Bld: 115 mg/dL — ABNORMAL HIGH (ref 70–99)
Potassium: 3.4 meq/L — ABNORMAL LOW (ref 3.5–5.1)
Sodium: 142 meq/L (ref 135–145)
Total Bilirubin: 0.2 mg/dL (ref 0.2–1.2)
Total Protein: 7.8 g/dL (ref 6.0–8.3)

## 2023-04-16 MED ORDER — RABEPRAZOLE SODIUM 20 MG PO TBEC
20.0000 mg | DELAYED_RELEASE_TABLET | Freq: Two times a day (BID) | ORAL | 5 refills | Status: DC
Start: 2023-04-16 — End: 2023-07-18

## 2023-04-16 NOTE — Progress Notes (Signed)
Subjective:  Patient ID: Judy Carlson, female    DOB: 1950/03/17  Age: 73 y.o. MRN: [id number]  CC: Hospitalization Follow-up (Pt is here for ED f/u, Discuss meds an dif pt has to start back taking any that was removed while in the ED.)   HPI TAWANDA SCHALL presents for a renal stone and black stools- s/p recent hospital stay  "Patient is a 73 year old female, past medical history significant for carotid artery disease status post CEA, hypertension, hyperlipidemia, CKD 3A, prediabetes, COPD, Raynaud's, mood disorder and former smoker.  Patient presented with right flank/right lower quadrant pain and persistent nausea and vomiting. Found to have distal right ureteral stone, 3 mm, possible infected stone. AKI suspected from dehydration.  UA revealed moderate hemoglobin, trace leukocyte and many bacteria.  Blood cultures pending.  Patient is on IV Rocephin.   04/06/2023: Patient seen.  Patient underwent cystoscopy with retrograde pyelogram, ureteroscopy and stent placement with stone basket extraction (right: Ureter) by the urology team.   04/07/2023: Patient seen alongside patient's nurse.  Urology input is appreciated.  Urology has instructed patient to remove right ureteral stent on Tuesday.  Acute kidney injury is improving.  Cultures are still pending.  Will continue antibiotics for now.  Likely discharge back home tomorrow on oral antibiotics.     Assessment & Plan:   Principal Problem:   Right ureteral stone Active Problems:   Chronic upper GI bleeding   Urinary tract infection   AKI (acute kidney injury) (HCC)     Distal right ureteral stone, 3 mm, possible infected stone Question complicated urinary tract infection. Right flank/right lower quadrant pain since 1/28 associated with nausea/vomiting.  No prior renal stones in the past.  Denies urinary changes.  UA is equivocal, see data above.  AKI suspected related to dehydration. CT abdomen pelvis with distal 3 mm right ureteral  stone with mild ureteral and perinephric stranding.  -Patient has undergone cystoscopy and ureteral stone extraction with stent placement (right ureter). -Patient is currently on IV Rocephin. -Blood cultures are pending. -No urine cultures visualized.  Patient is already on antibiotics. -Urology input is highly appreciated.  Please see above documentation.   AKI on CKD stage 3b: -Baseline creatinine 1.2, elevated to 2.2 on admission.   -AKI is likely multifactorial. -Ensure adequate hydration. -Patient has undergone right ureteral stone extraction. -Continue antibiotics. -Serum creatinine has improved to 1.64 (from 2.34) -Continue to monitor renal function and electrolytes.     Chronic black stools, episode of blood-streaked emesis Question of duodenitis on imaging History of peptic ulcer disease, H. pylori treated Hemoglobin is 13; recent baseline actually lower 11-12.  Likely hemoconcentrated in the setting of volume depletion.  Last EGD in 5/' 22 with nonbleeding gastric ulcers, gastritis, duodenitis; pathology ended up positive for H. pylori which she reports was treated. - Continue pantoprazole 40 mg PO twice daily continue for 8 weeks - Follow-up with GI outpatient, suspect slow chronic upper GI bleeding. Will need EGD  - Fecal occult blood not visualized.     Chronic medical problems: Carotid artery disease, s/p CEA: Continue home Plavix, Zetia Hypertension: Continue home metoprolol.  Hold home amlodipine-valsartan in setting of low normal pressures and AKI Hyperlipidemia: Continue home Zetia Pre-DM: Glucose within goal range, can add SSI if runs high. COPD: Albuterol as needed Raynaud's Mood disorder: No longer on escitalopram       DVT prophylaxis:  Code Status: DNR Family Communication:  Disposition Plan: Patient remains inpatient.  Multiple severe comorbidities.  Consultants:  Urology   Procedures:  Cystoscopy, right ureteral stent placement and stone  extraction"    Outpatient Medications Prior to Visit  Medication Sig Dispense Refill   acetaminophen (TYLENOL) 500 MG tablet Take 1,000 mg by mouth as needed for mild pain (pain score 1-3), headache or fever.     Cholecalciferol (VITAMIN D3) 50 MCG (2000 UT) capsule TAKE 1 CAPSULE (2,000 UNITS TOTAL) BY MOUTH DAILY. 90 capsule 3   clopidogrel (PLAVIX) 75 MG tablet TAKE 1 TABLET EVERY DAY 90 tablet 3   ezetimibe (ZETIA) 10 MG tablet TAKE 1 TABLET EVERY DAY 90 tablet 3   metoprolol succinate (TOPROL-XL) 100 MG 24 hr tablet TAKE 1 TABLET DAILY WITH OR IMMEDIATELY FOLLOWING A MEAL. 90 tablet 3   Multiple Vitamin (MULTIVITAMIN) tablet Take 1 tablet by mouth daily.     Olopatadine HCl (PATADAY OP) Place 1 drop into both eyes daily as needed (For dry eyes).     valsartan (DIOVAN) 80 MG tablet Take 1 tablet (80 mg total) by mouth daily. 30 tablet 1   escitalopram (LEXAPRO) 5 MG tablet Take 5 mg by mouth daily. (Patient not taking: Reported on 04/09/2023)     No facility-administered medications prior to visit.    ROS: Review of Systems  Constitutional:  Positive for fatigue. Negative for activity change, appetite change, chills and unexpected weight change.  HENT:  Negative for congestion, mouth sores and sinus pressure.   Eyes:  Negative for visual disturbance.  Respiratory:  Negative for cough and chest tightness.   Gastrointestinal:  Negative for abdominal pain, blood in stool, nausea and vomiting.  Genitourinary:  Negative for difficulty urinating, frequency and vaginal pain.  Musculoskeletal:  Negative for back pain and gait problem.  Skin:  Negative for pallor and rash.  Neurological:  Negative for dizziness, tremors, weakness, numbness and headaches.  Psychiatric/Behavioral:  Negative for confusion and sleep disturbance.   GERD   Objective:  BP (!) 160/108 (BP Location: Left Arm, Patient Position: Sitting, Cuff Size: Normal)   Pulse 60   Temp 98.6 F (37 C) (Oral)   Ht 5\' 5"  (1.651  m)   Wt 164 lb (74.4 kg)   SpO2 97%   BMI 27.29 kg/m   BP Readings from Last 3 Encounters:  04/16/23 (!) 160/108  04/08/23 128/86  03/14/23 118/70    Wt Readings from Last 3 Encounters:  04/16/23 164 lb (74.4 kg)  04/06/23 175 lb (79.4 kg)  02/26/23 183 lb (83 kg)    Physical Exam Constitutional:      General: She is not in acute distress.    Appearance: She is well-developed.  HENT:     Head: Normocephalic.     Right Ear: External ear normal.     Left Ear: External ear normal.     Nose: Nose normal.  Eyes:     General:        Right eye: No discharge.        Left eye: No discharge.     Conjunctiva/sclera: Conjunctivae normal.     Pupils: Pupils are equal, round, and reactive to light.  Neck:     Thyroid: No thyromegaly.     Vascular: No JVD.     Trachea: No tracheal deviation.  Cardiovascular:     Rate and Rhythm: Normal rate and regular rhythm.     Heart sounds: Normal heart sounds.  Pulmonary:     Effort: No respiratory distress.     Breath sounds: No stridor. No  wheezing.  Abdominal:     General: Bowel sounds are normal. There is no distension.     Palpations: Abdomen is soft. There is no mass.     Tenderness: There is no abdominal tenderness. There is no guarding or rebound.  Musculoskeletal:        General: No tenderness.     Cervical back: Normal range of motion and neck supple. No rigidity.  Lymphadenopathy:     Cervical: No cervical adenopathy.  Skin:    Findings: No erythema or rash.  Neurological:     Mental Status: She is oriented to person, place, and time.     Cranial Nerves: No cranial nerve deficit.     Motor: No abnormal muscle tone.     Coordination: Coordination normal.     Deep Tendon Reflexes: Reflexes normal.  Psychiatric:        Behavior: Behavior normal.        Thought Content: Thought content normal.        Judgment: Judgment normal.     Lab Results  Component Value Date   WBC 5.4 04/07/2023   HGB 10.1 (L) 04/07/2023   HCT  31.5 (L) 04/07/2023   PLT 304 04/07/2023   GLUCOSE 99 04/08/2023   CHOL 255 (H) 12/14/2022   TRIG 174.0 (H) 12/14/2022   HDL 89.10 12/14/2022   LDLDIRECT 132.9 04/30/2007   LDLCALC 131 (H) 12/14/2022   ALT 27 04/05/2023   AST 26 04/05/2023   NA 142 04/08/2023   K 3.8 04/08/2023   CL 110 04/08/2023   CREATININE 1.36 (H) 04/08/2023   BUN 13 04/08/2023   CO2 21 (L) 04/08/2023   TSH 4.28 12/14/2022   INR 1.1 04/06/2023   HGBA1C 5.6 12/14/2022    DG C-Arm 1-60 Min Result Date: 04/06/2023 CLINICAL DATA:  Cystoscopy with right retrograde pyelogram, stent placement and stone basket extraction. EXAM: DG C-ARM 1-60 MIN FLUOROSCOPY: Fluoroscopy Time: Radiation Exposure Index (if provided by the fluoroscopic device): 4.62 mGy Number of Acquired Spot Images: 0 COMPARISON:  CT abdomen pelvis 04/05/2023. FINDINGS: 4 intraoperative fluoroscopic spot views of a retrograde pyelography are submitted. Laterality is not included on the images. Per the provided clinical history, this is a right-sided procedure. Images show deployment of a double-J right ureteral stent with the proximal loop in the expected location of the renal pelvis and distal loop formed in the bladder. IMPRESSION: Intraoperative visualization for ureteral stent deployment and per the provided clinical history, on the right side. Electronically Signed   By: Leanna Battles M.D.   On: 04/06/2023 17:12   CT ABDOMEN PELVIS WO CONTRAST Result Date: 04/05/2023 CLINICAL DATA:  Abdominal pain, emesis, diarrhea.  Black stools. EXAM: CT ABDOMEN AND PELVIS WITHOUT CONTRAST TECHNIQUE: Multidetector CT imaging of the abdomen and pelvis was performed following the standard protocol without IV contrast. RADIATION DOSE REDUCTION: This exam was performed according to the departmental dose-optimization program which includes automated exposure control, adjustment of the mA and/or kV according to patient size and/or use of iterative reconstruction technique.  COMPARISON:  None Available. FINDINGS: Lower chest: No acute abnormality. Hepatobiliary: Unremarkable liver. Normal gallbladder. No biliary dilation. Pancreas: Unremarkable. Spleen: Unremarkable. Adrenals/Urinary Tract: Normal adrenal glands. A 3 mm stone is within the distal right ureter. Adjacent periureteral stranding about the mid and distal right ureter. No hydronephrosis. Trace asymmetric right perinephric edema. Bladder is unremarkable. Stomach/Bowel: Normal caliber large and small bowel. Question mild thickening of the wall of the duodenum with trace adjacent stranding. Colonic  diverticulosis without diverticulitis. The appendix is not definitively seen. Vascular/Lymphatic: Aortic atherosclerosis. No enlarged abdominal or pelvic lymph nodes. Reproductive: Unremarkable. Other: No free intraperitoneal fluid or air. Musculoskeletal: No acute fracture. IMPRESSION: 1. A 3 mm stone within the distal right ureter. Mild right perinephric and periureteral stranding. No hydronephrosis. 2. Question mild thickening of the wall of the duodenum with trace adjacent stranding. This could be due to duodenitis. 3. Colonic diverticulosis without diverticulitis. Electronically Signed   By: Minerva Fester M.D.   On: 04/05/2023 20:00    Assessment & Plan:   Problem List Items Addressed This Visit     Heartburn   Protonix is not helping much. Start Aciphex bid GI ref - Dr Myrtie Neither      Relevant Orders   Ambulatory referral to Gastroenterology   Anemia   Check CBC, Iron      Relevant Orders   Ambulatory referral to Gastroenterology   Black stools - Primary   Will ref to Dr Myrtie Neither Chronic x 1 year - 04/2023 Not using  Pepto-bismol or iron po H/o gastritis On Protonix. Protonix is not helping much. Start Aciphex bid GI ref - Dr Myrtie Neither Last EGD 20222      Relevant Orders   Ambulatory referral to Gastroenterology   CBC with Differential/Platelet   Iron, TIBC and Ferritin Panel   Kidney stone   04/2023 R  ureter 3 mm S/p removal/STENT - Dr Mena Goes      Relevant Orders   Comprehensive metabolic panel   Urinalysis      Meds ordered this encounter  Medications   RABEprazole (ACIPHEX) 20 MG tablet    Sig: Take 1 tablet (20 mg total) by mouth in the morning and at bedtime.    Dispense:  60 tablet    Refill:  5      Follow-up: No follow-ups on file.  Sonda Primes, MD

## 2023-04-16 NOTE — Assessment & Plan Note (Signed)
Protonix is not helping much. Start Aciphex bid GI ref - Dr Myrtie Neither

## 2023-04-16 NOTE — Assessment & Plan Note (Signed)
Check CBC, Iron

## 2023-04-16 NOTE — Assessment & Plan Note (Signed)
04/2023 R ureter 3 mm S/p removal/STENT - Dr Mena Goes

## 2023-04-16 NOTE — Assessment & Plan Note (Addendum)
Will ref to Dr Myrtie Neither Chronic x 1 year - 04/2023 Not using  Pepto-bismol or iron po H/o gastritis On Protonix. Protonix is not helping much. Start Aciphex bid GI ref - Dr Myrtie Neither Last EGD 20222

## 2023-04-17 LAB — IRON,TIBC AND FERRITIN PANEL
%SAT: 43 % (ref 16–45)
Ferritin: 16 ng/mL (ref 16–288)
Iron: 162 ug/dL — ABNORMAL HIGH (ref 45–160)
TIBC: 381 ug/dL (ref 250–450)

## 2023-04-18 ENCOUNTER — Encounter: Payer: Self-pay | Admitting: Internal Medicine

## 2023-04-18 ENCOUNTER — Other Ambulatory Visit: Payer: Self-pay | Admitting: Internal Medicine

## 2023-04-18 MED ORDER — POTASSIUM CHLORIDE ER 8 MEQ PO TBCR: 8.0000 meq | EXTENDED_RELEASE_TABLET | Freq: Every day | ORAL | 3 refills | Status: DC

## 2023-05-27 DIAGNOSIS — R8271 Bacteriuria: Secondary | ICD-10-CM | POA: Diagnosis not present

## 2023-05-27 DIAGNOSIS — N201 Calculus of ureter: Secondary | ICD-10-CM | POA: Diagnosis not present

## 2023-05-29 ENCOUNTER — Other Ambulatory Visit: Payer: Self-pay | Admitting: Internal Medicine

## 2023-06-03 ENCOUNTER — Other Ambulatory Visit: Payer: Self-pay | Admitting: Internal Medicine

## 2023-06-03 NOTE — Telephone Encounter (Signed)
 Medication, valsartan, managed by nephrologist Dr Dartha Lodge. Duplicate request. Please see chart for refill request on 3/26 and followed up by pharmacy on 3/28. Cancelled reorder, closing encounter.

## 2023-06-10 ENCOUNTER — Telehealth: Payer: Self-pay | Admitting: Internal Medicine

## 2023-06-10 NOTE — Telephone Encounter (Signed)
 Copied from CRM [id number]. Topic: Clinical - Medical Advice >> Jun 07, 2023  4:40 PM Higinio Roger wrote: Reason for CRM: Patient would like to know if she should continue taking valsartan (DIOVAN) 80 MG tablet. She stated her urologist took her off due to having an kidney issue a while ago but she is no longer seeing one.  Callback #: [phone]

## 2023-06-11 NOTE — Telephone Encounter (Signed)
 Copied from CRM [id number]. Topic: Clinical - Medication Question >> Jun 11, 2023 10:31 AM Fredrich Romans wrote: Reason for CRM: Shanda Bumps from [facility] called to verify a medication for patient.She stated that patient isnt sure if she is suppose to take valsartan or valsartan amlodipine combination?  Cb#: [id number]

## 2023-06-12 ENCOUNTER — Ambulatory Visit: Payer: Medicare PPO | Admitting: Internal Medicine

## 2023-06-14 ENCOUNTER — Other Ambulatory Visit: Payer: Self-pay | Admitting: Internal Medicine

## 2023-06-14 NOTE — Telephone Encounter (Signed)
 Copied from CRM [id number]. Topic: Clinical - Medication Refill >> Jun 14, 2023  9:19 AM Ann Held wrote: Most Recent Primary Care Visit:  Provider: Tresa Garter  Department: [facility]  Visit Type: OFFICE VISIT  Date: 04/16/2023  Medication: valsartan (DIOVAN) 80 MG tablet  Has the patient contacted their pharmacy? Yes (Agent: If no, request that the patient contact the pharmacy for the refill. If patient does not wish to contact the pharmacy document the reason why and proceed with request.) (Agent: If yes, when and what did the pharmacy advise?)  Is this the correct pharmacy for this prescription? Yes If no, delete pharmacy and type the correct one.  This is the patient's preferred pharmacy:  [id number] - [place] - [address] [place] [address] Phone: [phone] Fax: [phone]   Has the prescription been filled recently? No  Is the patient out of the medication? Yes  Has the patient been seen for an appointment in the last year OR does the patient have an upcoming appointment? Yes  Can we respond through MyChart? Yes  Agent: Please be advised that Rx refills may take up to 3 business days. We ask that you follow-up with your pharmacy.

## 2023-06-14 NOTE — Telephone Encounter (Signed)
 Looks like message was sent to Dr. Macario Golds regarding this medication the other day

## 2023-06-19 ENCOUNTER — Other Ambulatory Visit: Payer: Self-pay | Admitting: Internal Medicine

## 2023-06-21 ENCOUNTER — Telehealth: Payer: Self-pay | Admitting: Internal Medicine

## 2023-06-21 NOTE — Telephone Encounter (Signed)
 Copied from CRM [id number]. Topic: Clinical - Prescription Issue >> Jun 21, 2023  3:07 PM Chuck Crater wrote: Reason for CRM: Kin with [facility] is calling to check on the status of patient's refill for Valsartan . [facility] still doesn't have prescription.

## 2023-06-25 ENCOUNTER — Ambulatory Visit (INDEPENDENT_AMBULATORY_CARE_PROVIDER_SITE_OTHER)

## 2023-06-25 VITALS — Ht 65.0 in | Wt 180.0 lb

## 2023-06-25 DIAGNOSIS — Z0000 Encounter for general adult medical examination without abnormal findings: Secondary | ICD-10-CM

## 2023-06-25 NOTE — Patient Instructions (Signed)
 [name] , Thank you for taking time to come for your Medicare Wellness Visit. I appreciate your ongoing commitment to your health goals. Please review the following plan we discussed and let me know if I can assist you in the future.   Referrals/Orders/Follow-Ups/Clinician Recommendations: It was nice talking with you today.  Remember to discuss all medicatins during your next office visit with Dr. [name] Kipper.    This is a list of the screening recommended for you and due dates:  Health Maintenance  Topic Date Due   Medicare Annual Wellness Visit  07/25/2023   COVID-19 Vaccine (8 - 2024-25 season) 09/26/2023   Flu Shot  10/04/2023   Mammogram  02/14/2024   DTaP/Tdap/Td vaccine (2 - Td or Tdap) 03/17/2028   Colon Cancer Screening  07/08/2030   Pneumonia Vaccine  Completed   DEXA scan (bone density measurement)  Completed   Hepatitis C Screening  Completed   Zoster (Shingles) Vaccine  Completed   HPV Vaccine  Aged Out   Meningitis B Vaccine  Aged Out    Advanced directives: (Declined) Advance directive discussed with you today. Even though you declined this today, please call our office should you change your mind, and we can give you the proper paperwork for you to fill out.  Next Medicare Annual Wellness Visit scheduled for next year: Yes
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
## 2023-06-27 ENCOUNTER — Other Ambulatory Visit: Payer: Self-pay

## 2023-06-27 MED ORDER — VALSARTAN 80 MG PO TABS: 80.0000 mg | ORAL_TABLET | Freq: Every day | ORAL | 1 refills | Status: DC

## 2023-06-27 NOTE — Telephone Encounter (Signed)
 She is supposed to take valsartan -see meds.  Thanks

## 2023-06-27 NOTE — Addendum Note (Signed)
 Addended by: Serene Danas on: 06/27/2023 11:52 AM   Modules accepted: Orders

## 2023-06-27 NOTE — Telephone Encounter (Signed)
 Prescription has been sent out to pharmacy

## 2023-06-27 NOTE — Telephone Encounter (Signed)
Please see other note. Thanks.

## 2023-06-27 NOTE — Telephone Encounter (Signed)
 Patient should just be taking Valsartan  per PCP's last telephone note. Medication has been resent as of today

## 2023-07-10 ENCOUNTER — Emergency Department (HOSPITAL_COMMUNITY)

## 2023-07-10 ENCOUNTER — Other Ambulatory Visit: Payer: Self-pay

## 2023-07-10 ENCOUNTER — Emergency Department (HOSPITAL_COMMUNITY)
Admission: EM | Admit: 2023-07-10 | Discharge: 2023-07-10 | Disposition: A | Discharge status: Home / Self Care | Attending: Emergency Medicine | Admitting: Emergency Medicine

## 2023-07-10 ENCOUNTER — Ambulatory Visit: Payer: Self-pay

## 2023-07-10 ENCOUNTER — Telehealth: Payer: Self-pay | Admitting: Internal Medicine

## 2023-07-10 DIAGNOSIS — I672 Cerebral atherosclerosis: Secondary | ICD-10-CM | POA: Diagnosis not present

## 2023-07-10 DIAGNOSIS — H1045 Other chronic allergic conjunctivitis: Secondary | ICD-10-CM | POA: Diagnosis not present

## 2023-07-10 DIAGNOSIS — R531 Weakness: Secondary | ICD-10-CM | POA: Diagnosis not present

## 2023-07-10 DIAGNOSIS — R55 Syncope and collapse: Secondary | ICD-10-CM | POA: Diagnosis not present

## 2023-07-10 DIAGNOSIS — M79601 Pain in right arm: Secondary | ICD-10-CM | POA: Diagnosis not present

## 2023-07-10 DIAGNOSIS — M5020 Other cervical disc displacement, unspecified cervical region: Secondary | ICD-10-CM | POA: Diagnosis not present

## 2023-07-10 DIAGNOSIS — Z79899 Other long term (current) drug therapy: Secondary | ICD-10-CM | POA: Insufficient documentation

## 2023-07-10 DIAGNOSIS — H34231 Retinal artery branch occlusion, right eye: Secondary | ICD-10-CM | POA: Diagnosis not present

## 2023-07-10 DIAGNOSIS — Z7902 Long term (current) use of antithrombotics/antiplatelets: Secondary | ICD-10-CM | POA: Diagnosis not present

## 2023-07-10 DIAGNOSIS — I6502 Occlusion and stenosis of left vertebral artery: Secondary | ICD-10-CM | POA: Diagnosis not present

## 2023-07-10 DIAGNOSIS — I6523 Occlusion and stenosis of bilateral carotid arteries: Secondary | ICD-10-CM | POA: Diagnosis not present

## 2023-07-10 DIAGNOSIS — Z961 Presence of intraocular lens: Secondary | ICD-10-CM | POA: Diagnosis not present

## 2023-07-10 DIAGNOSIS — H2512 Age-related nuclear cataract, left eye: Secondary | ICD-10-CM | POA: Diagnosis not present

## 2023-07-10 DIAGNOSIS — M542 Cervicalgia: Secondary | ICD-10-CM | POA: Insufficient documentation

## 2023-07-10 DIAGNOSIS — M5030 Other cervical disc degeneration, unspecified cervical region: Secondary | ICD-10-CM | POA: Diagnosis not present

## 2023-07-10 DIAGNOSIS — M792 Neuralgia and neuritis, unspecified: Secondary | ICD-10-CM

## 2023-07-10 DIAGNOSIS — Z8673 Personal history of transient ischemic attack (TIA), and cerebral infarction without residual deficits: Secondary | ICD-10-CM | POA: Insufficient documentation

## 2023-07-10 LAB — CBC WITH DIFFERENTIAL/PLATELET
Abs Immature Granulocytes: 0.01 10*3/uL (ref 0.00–0.07)
Basophils Absolute: 0.1 10*3/uL (ref 0.0–0.1)
Basophils Relative: 1 %
Eosinophils Absolute: 0.1 10*3/uL (ref 0.0–0.5)
Eosinophils Relative: 2 %
HCT: 40.4 % (ref 36.0–46.0)
Hemoglobin: 12.9 g/dL (ref 12.0–15.0)
Immature Granulocytes: 0 %
Lymphocytes Relative: 23 %
Lymphs Abs: 1.3 10*3/uL (ref 0.7–4.0)
MCH: 27.6 pg (ref 26.0–34.0)
MCHC: 31.9 g/dL (ref 30.0–36.0)
MCV: 86.5 fL (ref 80.0–100.0)
Monocytes Absolute: 0.8 10*3/uL (ref 0.1–1.0)
Monocytes Relative: 14 %
Neutro Abs: 3.5 10*3/uL (ref 1.7–7.7)
Neutrophils Relative %: 60 %
Platelets: 332 10*3/uL (ref 150–400)
RBC: 4.67 MIL/uL (ref 3.87–5.11)
RDW: 17.3 % — ABNORMAL HIGH (ref 11.5–15.5)
WBC: 5.7 10*3/uL (ref 4.0–10.5)
nRBC: 0 % (ref 0.0–0.2)

## 2023-07-10 LAB — I-STAT CHEM 8, ED
BUN: 34 mg/dL — ABNORMAL HIGH (ref 8–23)
Calcium, Ion: 1.26 mmol/L (ref 1.15–1.40)
Chloride: 107 mmol/L (ref 98–111)
Creatinine, Ser: 1.6 mg/dL — ABNORMAL HIGH (ref 0.44–1.00)
Glucose, Bld: 96 mg/dL (ref 70–99)
HCT: 42 % (ref 36.0–46.0)
Hemoglobin: 14.3 g/dL (ref 12.0–15.0)
Potassium: 4.1 mmol/L (ref 3.5–5.1)
Sodium: 142 mmol/L (ref 135–145)
TCO2: 25 mmol/L (ref 22–32)

## 2023-07-10 LAB — COMPREHENSIVE METABOLIC PANEL WITH GFR
ALT: 23 U/L (ref 0–44)
AST: 33 U/L (ref 15–41)
Albumin: 3.3 g/dL — ABNORMAL LOW (ref 3.5–5.0)
Alkaline Phosphatase: 54 U/L (ref 38–126)
Anion gap: 11 (ref 5–15)
BUN: 23 mg/dL (ref 8–23)
CO2: 21 mmol/L — ABNORMAL LOW (ref 22–32)
Calcium: 8.8 mg/dL — ABNORMAL LOW (ref 8.9–10.3)
Chloride: 107 mmol/L (ref 98–111)
Creatinine, Ser: 1.44 mg/dL — ABNORMAL HIGH (ref 0.44–1.00)
GFR, Estimated: 38 mL/min — ABNORMAL LOW (ref >60)
Glucose, Bld: 93 mg/dL (ref 70–99)
Potassium: 3.8 mmol/L (ref 3.5–5.1)
Sodium: 139 mmol/L (ref 135–145)
Total Bilirubin: 0.7 mg/dL (ref 0.0–1.2)
Total Protein: 6.2 g/dL — ABNORMAL LOW (ref 6.5–8.1)

## 2023-07-10 LAB — MAGNESIUM: Magnesium: 1.9 mg/dL (ref 1.7–2.4)

## 2023-07-10 LAB — CBG MONITORING, ED: Glucose-Capillary: 124 mg/dL — ABNORMAL HIGH (ref 70–99)

## 2023-07-10 MED ORDER — DICLOFENAC SODIUM 1 % EX GEL: 4.0000 g | Freq: Four times a day (QID) | CUTANEOUS | 0 refills | Status: DC

## 2023-07-10 MED ORDER — SODIUM CHLORIDE 0.9 % IV BOLUS
1000.0000 mL | Freq: Once | INTRAVENOUS | Status: AC
Start: 2023-07-10 — End: 2023-07-10
  Administered 2023-07-10: 1000 mL via INTRAVENOUS

## 2023-07-10 MED ORDER — METHYLPREDNISOLONE 4 MG PO TBPK: ORAL_TABLET | ORAL | 0 refills | Status: DC

## 2023-07-10 MED ORDER — IOHEXOL 350 MG/ML SOLN
75.0000 mL | Freq: Once | INTRAVENOUS | Status: AC | PRN
  Administered 2023-07-10: 75 mL via INTRAVENOUS

## 2023-07-10 NOTE — ED Triage Notes (Signed)
 Pt presents from home with c/o dizziness, and "almost blacking out this morning while I was putting my eye drops in." "I think I held my head back for too long." Pt denies hitting head or LOC, stating she fell onto her butt. Pt states that she had a bout of N/V/D  yesterday, x 1. Resolved.  Hx: HTN

## 2023-07-10 NOTE — Telephone Encounter (Signed)
  Chief Complaint: near syncope, tingling, weakness, dizzy Symptoms: fall, dizzy, tingling, weakness Frequency: onset just prior to calling in Pertinent Negatives: Patient denies other symptoms Disposition: [x] ED /[] Urgent Care (no appt availability in office) / [] Appointment(In office/virtual)/ []  [place] [facility]/ [] Home Care/ [] Refused Recommended Disposition /[] [place] [facility]/ []  Follow-up with PCP Additional Notes:  This morning when putting in eye drops she felt faint with tingling feeling in her upper body around neck and head, she did fall to the floor on her bottom, did not hit head, did not lose consciousness. She is feeling better now but states she is symptomatic with movement. She spoke with Dr. Rich Champ "Geralyn Knee" Groat-Ophthalmologist who advised to call PCP, he mentioned to Jermecia that he will be reaching out to Dr. [name] Kipper today. Emergency room evaluation advised, advised to obtain ride since symptomatic, if symptoms recur while enroute to call for EMS. Patient in agreement and will proceed to emergency room.   Copied from CRM [id number]. Topic: Clinical - Red Word Triage >> Jul 10, 2023  9:58 AM Dewanda Foots wrote: Red Word that prompted transfer to Nurse Triage: Pt states that this morning when putting in her eye drops she leaned her head back and almost passed out. States when she turns her head to the right or left, she gets a tingling sensation on the side she is turning her head to.  She called her optimologist this morning and they recommended that she contact her PCP and let him know. Reason for Disposition  [1] Dizziness (vertigo) present now AND [2] one or more STROKE RISK FACTORS (i.e., hypertension, diabetes, prior stroke/TIA, heart attack)  (Exception: Prior doctor or NP/PA evaluation for this AND no different/worse than usual.)  Protocols used: Dizziness - Vertigo-A-AH

## 2023-07-10 NOTE — Discharge Instructions (Signed)
 I think you are having with radicular pain.  Typically this is due to compression of the nerve.  I started you on steroids to try and help you with this.  Please follow-up with your family doctor in the office.  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  This is where you and had your vessel cleaned out.  Please let your vascular surgeon know about your visit so they can keep following this along and decide if it needs to be cleaned out again.

## 2023-07-10 NOTE — ED Provider Notes (Signed)
 [facility] Provider Note   CSN: [id number] Arrival date & time: 07/10/23  1019     History  Chief Complaint  Patient presents with   Near Syncope   Fall    - LOC   Weakness    Judy Carlson is a 73 y.o. female.  73 yo F with a chief complaints of almost passing out.  The patient was putting eyedrops in this morning and she suddenly felt unwell she ended up falling onto her bottom.  She denies any other injury.  Her bottom is sore but it is she thinks she is bruised.  She has been having right sided neck pain that radiates down the arm.  This been going on for couple days.  She denies obvious injury to the head or neck.  Feels like that side sometimes feels little bit weaker.  She has had a history of a stroke in the past.  She was seeing her eye doctor today in the office.  They encouraged her to get looked at for this.  She called her family doctor and they encouraged her to come to the ED for evaluation.        Home Medications Prior to Admission medications   Medication Sig Start Date End Date Taking? Authorizing Provider  diclofenac Sodium (VOLTAREN) 1 % GEL Apply 4 g topically 4 (four) times daily. 07/10/23  Yes Albertus Hughs, DO  methylPREDNISolone  (MEDROL  DOSEPAK) 4 MG TBPK tablet Day 1: 8mg  before breakfast, 4 mg after lunch, 4 mg after supper, and 8 mg at bedtime Day 2: 4 mg before breakfast, 4 mg after lunch, 4 mg  after supper, and 8 mg  at bedtime Day 3:  4 mg  before breakfast, 4 mg  after lunch, 4 mg after supper, and 4 mg  at bedtime Day 4: 4 mg  before breakfast, 4 mg  after lunch, and 4 mg at bedtime Day 5: 4 mg  before breakfast and 4 mg at bedtime Day 6: 4 mg  before breakfast 07/10/23  Yes Lindsi Bayliss, DO  acetaminophen  (TYLENOL ) 500 MG tablet Take 1,000 mg by mouth as needed for mild pain (pain score 1-3), headache or fever.    [provider]  Cholecalciferol  (VITAMIN D3) 50 MCG (2000 UT) capsule TAKE 1 CAPSULE  (2,000 UNITS TOTAL) BY MOUTH DAILY. 03/14/22   Plotnikov, Oakley Bellman, MD  clopidogrel  (PLAVIX ) 75 MG tablet TAKE 1 TABLET EVERY DAY 12/14/22   Carlene Che, MD  escitalopram  (LEXAPRO ) 5 MG tablet Take 5 mg by mouth daily. Patient not taking: Reported on 06/25/2023    [provider]  ezetimibe  (ZETIA ) 10 MG tablet TAKE 1 TABLET EVERY DAY 03/05/23   Plotnikov, Aleksei V, MD  metoprolol  succinate (TOPROL -XL) 100 MG 24 hr tablet TAKE 1 TABLET DAILY WITH OR IMMEDIATELY FOLLOWING A MEAL. 12/13/22   Plotnikov, Aleksei V, MD  Multiple Vitamin (MULTIVITAMIN) tablet Take 1 tablet by mouth daily.    [provider]  Olopatadine  HCl (PATADAY  OP) Place 1 drop into both eyes daily as needed (For dry eyes).    [provider]  potassium chloride  (KLOR-CON ) 8 MEQ tablet Take 1 tablet (8 mEq total) by mouth daily. 04/18/23 04/17/24  Plotnikov, Oakley Bellman, MD  RABEprazole  (ACIPHEX ) 20 MG tablet Take 1 tablet (20 mg total) by mouth in the morning and at bedtime. 04/16/23   Plotnikov, Oakley Bellman, MD  valsartan  (DIOVAN ) 80 MG tablet Take 1 tablet (80  mg total) by mouth daily. 06/27/23 06/26/24  Plotnikov, Aleksei V, MD      Allergies    Crestor  [rosuvastatin  calcium ], Doxycycline, Lipitor [atorvastatin ], Lisinopril, Naproxen, Nsaids, Pravastatin , Prednisone , Amoxicillin , Clindamycin /lincomycin, and Lovastatin     Review of Systems   Review of Systems  Physical Exam Updated Vital Signs BP (!) 120/57 (BP Location: Right Arm)   Pulse 67   Temp 98.1 F (36.7 C) (Oral)   Resp 19   SpO2 100%  Physical Exam Vitals and nursing note reviewed.  Constitutional:      General: She is not in acute distress.    Appearance: She is well-developed. She is not diaphoretic.  HENT:     Head: Normocephalic and atraumatic.  Eyes:     Pupils: Pupils are equal, round, and reactive to light.  Cardiovascular:     Rate and Rhythm: Normal rate and regular rhythm.     Heart sounds: No murmur heard.    No  friction rub. No gallop.  Pulmonary:     Effort: Pulmonary effort is normal.     Breath sounds: No wheezing or rales.  Abdominal:     General: There is no distension.     Palpations: Abdomen is soft.     Tenderness: There is no abdominal tenderness.  Musculoskeletal:        General: No tenderness.     Cervical back: Normal range of motion and neck supple.     Comments: No obvious midline C-spine tenderness step-offs or deformities.  She does have a positive Spurling's test that reproduces her symptoms.  She is perhaps mildly weak to the right upper extremity otherwise pulse motor and sensation are intact.  Skin:    General: Skin is warm and dry.  Neurological:     Mental Status: She is alert and oriented to person, place, and time.     GCS: GCS eye subscore is 4. GCS verbal subscore is 5. GCS motor subscore is 6.     Sensory: Sensation is intact.     Motor: Motor function is intact.     Coordination: Coordination is intact.     Comments: Her pupils are dilated from her ophthalmologic exam.  Otherwise cranial nerves II through XII are intact.  She has perhaps some very mild right upper extremity weakness compared to left.  Psychiatric:        Behavior: Behavior normal.     ED Results / Procedures / Treatments   Labs (all labs ordered are listed, but only abnormal results are displayed) Labs Reviewed  CBC WITH DIFFERENTIAL/PLATELET - Abnormal; Notable for the following components:      Result Value   RDW 17.3 (*)    All other components within normal limits  COMPREHENSIVE METABOLIC PANEL WITH GFR - Abnormal; Notable for the following components:   CO2 21 (*)    Creatinine, Ser 1.44 (*)    Calcium  8.8 (*)    Total Protein 6.2 (*)    Albumin  3.3 (*)    GFR, Estimated 38 (*)    All other components within normal limits  CBG MONITORING, ED - Abnormal; Notable for the following components:   Glucose-Capillary 124 (*)    All other components within normal limits  I-STAT CHEM 8,  ED - Abnormal; Notable for the following components:   BUN 34 (*)    Creatinine, Ser 1.60 (*)    All other components within normal limits  MAGNESIUM     EKG EKG Interpretation Date/Time:  Wednesday Jul 10 2023 10:27:08 EDT Ventricular Rate:  83 PR Interval:  133 QRS Duration:  82 QT Interval:  380 QTC Calculation: 447 R Axis:   56  Text Interpretation: Sinus rhythm Multiform ventricular premature complexes LAE, consider biatrial enlargement Borderline repolarization abnormality Otherwise no significant change Confirmed by Albertus Hughs [phone]) on 07/10/2023 10:44:20 AM  Radiology CT C-SPINE NO CHARGE Result Date: 07/10/2023 CLINICAL DATA:  Near syncope.  Fall.  Weakness. EXAM: CT CERVICAL SPINE WITH CONTRAST TECHNIQUE: Multiplanar CT images of the cervical spine were reconstructed from contemporary CTA of the Neck. RADIATION DOSE REDUCTION: This exam was performed according to the departmental dose-optimization program which includes automated exposure control, adjustment of the mA and/or kV according to patient size and/or use of iterative reconstruction technique. CONTRAST:  No additional. COMPARISON:  CTA head and neck 12/24/2018 FINDINGS: Alignment: Chronic straightening/slight reversal of the normal cervical lordosis. Chronic trace anterolisthesis of C7 on T1. Skull base and vertebrae: No acute fracture or suspicious lesion. Soft tissues and spinal canal: No prevertebral fluid or swelling. No visible canal hematoma. Disc levels: Widespread, advanced disc degeneration in the cervical spine. At least moderate spinal stenosis at C3-4, in part due to a calcified central disc protrusion. Moderate to severe multilevel neural foraminal stenosis. Upper chest: No mass or consolidation in the included lung apices. Other: None. IMPRESSION: 1. No acute cervical spine fracture or traumatic malalignment. 2. Advanced cervical disc degeneration. Electronically Signed   By: Aundra Lee M.D.   On: 07/10/2023  14:46   CT ANGIO HEAD NECK W WO CM Result Date: 07/10/2023 CLINICAL DATA:  Carotid artery dissection suspected. Near syncope. Fall. Weakness. EXAM: CT ANGIOGRAPHY HEAD AND NECK WITH AND WITHOUT CONTRAST TECHNIQUE: Multidetector CT imaging of the head and neck was performed using the standard protocol during bolus administration of intravenous contrast. Multiplanar CT image reconstructions and MIPs were obtained to evaluate the vascular anatomy. Carotid stenosis measurements (when applicable) are obtained utilizing NASCET criteria, using the distal internal carotid diameter as the denominator. RADIATION DOSE REDUCTION: This exam was performed according to the departmental dose-optimization program which includes automated exposure control, adjustment of the mA and/or kV according to patient size and/or use of iterative reconstruction technique. CONTRAST:  75mL OMNIPAQUE  IOHEXOL  350 MG/ML SOLN COMPARISON:  CTA head and neck and MRI head 12/24/2018 FINDINGS: CT HEAD FINDINGS Brain: There is no evidence of an acute infarct, intracranial hemorrhage, mass, midline shift, or extra-axial fluid collection. Cerebral volume is normal. The ventricles are normal in size. Vascular: Calcified atherosclerosis at the skull base. No hyperdense vessel. Skull: No acute fracture or suspicious lesion. Sinuses/Orbits: Paranasal sinuses and mastoid air cells are clear. Right cataract extraction. Other: None. Review of the MIP images confirms the above findings CTA NECK FINDINGS Aortic arch: Standard branching with moderate atherosclerosis. Thickly recurrent stenosis of the origin of the left subclavian artery, mildly progressed. Right carotid system: Interval endarterectomy. Large amount of soft plaque and/or thrombus predominantly posteriorly in the distal common carotid artery near the carotid bifurcation extending into the proximal ICA where it has the appearance of a central filling defect/thrombus resulting in 75% ICA stenosis. Left  carotid system: Patent with a moderate amount of soft and calcified plaque in the distal common carotid and proximal internal carotid arteries not resulting in a significant stenosis. Vertebral arteries: Patent with the left being dominant. Unchanged severe right and mild left vertebral artery origin stenoses. Skeleton: See separately reported cervical spine CT. Other neck: No evidence of cervical lymphadenopathy or mass. Upper  chest: No mass or consolidation in the included lung apices. Review of the MIP images confirms the above findings CTA HEAD FINDINGS Anterior circulation: The internal carotid arteries are patent from skull base to carotid termini with atherosclerosis resulting in up to moderate multifocal siphon stenoses bilaterally, similar to the prior CTA. ACAs and MCAs are patent with mild branch vessel irregularity but no definite proximal branch occlusion or flow limiting proximal stenosis. No aneurysm is identified. Posterior circulation: The intracranial vertebral arteries are patent to the basilar with mild atherosclerotic regularity but no flow limiting stenosis. Patent left PICA, bilateral AICA, and bilateral SCA origins are visualized. The basilar artery is widely patent. There are small left and diminutive or absent right posterior communicating arteries. Both PCAs are patent without evidence of a significant proximal stenosis on the right. There is a new moderate mid left P2 stenosis. No aneurysm is identified. Venous sinuses: The superior sagittal sinus, straight sinus, and transverse sinuses are patent. Suboptimal assessment of the sigmoid sinuses due to contrast timing. Anatomic variants: None. Review of the MIP images confirms the above findings IMPRESSION: 1. No evidence of an acute intracranial abnormality on noncontrast head CT. 2. Interval right carotid endarterectomy. 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 3. Unchanged severe right and mild left  vertebral artery origin stenoses. 4. Unchanged moderate bilateral ICA siphon stenoses. 5. New moderate left P2 stenosis. 6.  Aortic Atherosclerosis (ICD10-I70.0). Electronically Signed   By: Aundra Lee M.D.   On: 07/10/2023 14:43    Procedures Procedures    Medications Ordered in ED Medications  sodium chloride  0.9 % bolus 1,000 mL (0 mLs Intravenous Stopped 07/10/23 1553)  iohexol  (OMNIPAQUE ) 350 MG/ML injection 75 mL (75 mLs Intravenous Contrast Given 07/10/23 1351)    ED Course/ Medical Decision Making/ A&P                                 Medical Decision Making Amount and/or Complexity of Data Reviewed Labs: ordered. Radiology: ordered.  Risk Prescription drug management.   73 yo F with a chief complaints of a near syncopal event.  The patient had tilted her head back to put eyedrops in she had had radiation of pain from her neck down her right arm which she has been having for couple days she felt unwell and collapsed to the ground.  She denies injury in the fall.  Has been doing fine since then.  Has been having some right-sided neck pain that radiates down the arm for couple days.  Called her family doctor who suggested she come here for evaluation.  Patient had a dilated eye exam by her ophthalmologist.  She has perhaps some mild right upper extremity weakness that could be secondary to the discomfort.  Otherwise sounds like a cervical radiculopathy with perhaps a vasovagal event.  She has a positive Spurling's test here.  On record review the patient had a carotid duplex done about 6 months ago.  No significant stenosis.  Will obtain a CT scan of the head and neck to assess for possible carotid or vertebral artery dissection.  Assess for acute stenosis.  Blood work.  IV fluids reassess.  CTa with 75% stenosis. CT c spine without obvious acute pathology.   No acute anemia, no significant electrolyte abnormalities.   Discussed results with patient.  Medrol  Dosepak for  patient's radicular pain.  PCP vascular follow-up.  4:03 PM:  I have discussed the diagnosis/risks/treatment options with the patient.  Evaluation and diagnostic testing in the emergency department does not suggest an emergent condition requiring admission or immediate intervention beyond what has been performed at this time.  They will follow up with PCP, vascular. We also discussed returning to the ED immediately if new or worsening sx occur. We discussed the sx which are most concerning (e.g., sudden worsening pain, fever, inability to tolerate by mouth) that necessitate immediate return. Medications administered to the patient during their visit and any new prescriptions provided to the patient are listed below.  Medications given during this visit Medications  sodium chloride  0.9 % bolus 1,000 mL (0 mLs Intravenous Stopped 07/10/23 1553)  iohexol  (OMNIPAQUE ) 350 MG/ML injection 75 mL (75 mLs Intravenous Contrast Given 07/10/23 1351)     The patient appears reasonably screen and/or stabilized for discharge and I doubt any other medical condition or other [facility] requiring further screening, evaluation, or treatment in the ED at this time prior to discharge.          Final Clinical Impression(s) / ED Diagnoses Final diagnoses:  Syncope and collapse  Radicular pain in right arm    Rx / DC Orders ED Discharge Orders          Ordered    methylPREDNISolone  (MEDROL  DOSEPAK) 4 MG TBPK tablet        07/10/23 1545    diclofenac Sodium (VOLTAREN) 1 % GEL  4 times daily        07/10/23 1545              Moody, DO 07/10/23 1603

## 2023-07-10 NOTE — Telephone Encounter (Signed)
 Copied from CRM [id number]. Topic: General - Other >> Jul 10, 2023  9:51 AM Luane Rumps D wrote: Reason for CRM: Cathleen Coach with [facility] called because Ms. Witherspoon eye doctor, Dr. Diedre Fox, would like to speak with Dr. [name] Kipper on the phone but she didn't know what the call was regarding. Dr Candi Chafe can be reached at: [phone].

## 2023-07-10 NOTE — ED Notes (Signed)
Got patient into a gown on the monitor did EKG shown to Dr Adela Lank patient is resting with call bell in reach

## 2023-07-11 ENCOUNTER — Telehealth: Payer: Self-pay

## 2023-07-11 DIAGNOSIS — Z6829 Body mass index (BMI) 29.0-29.9, adult: Secondary | ICD-10-CM | POA: Diagnosis not present

## 2023-07-11 DIAGNOSIS — E663 Overweight: Secondary | ICD-10-CM | POA: Diagnosis not present

## 2023-07-11 DIAGNOSIS — F17211 Nicotine dependence, cigarettes, in remission: Secondary | ICD-10-CM | POA: Diagnosis not present

## 2023-07-11 DIAGNOSIS — Z008 Encounter for other general examination: Secondary | ICD-10-CM | POA: Diagnosis not present

## 2023-07-11 NOTE — Transitions of Care (Post Inpatient/ED Visit) (Signed)
 07/11/2023  Name: Judy Carlson MRN: [id number] DOB: March 28, 1950  Today's TOC FU Call Status: Today's TOC FU Call Status:: Successful TOC FU Call Completed TOC FU Call Complete Date: 07/11/23 Patient's Name and Date of Birth confirmed.  Transition Care Management Follow-up Telephone Call Date of Discharge: 07/10/23 Discharge Facility: [facility]) Type of Discharge: Emergency Department Reason for ED Visit: Other: (syncope) How have you been since you were released from the hospital?: Better Any questions or concerns?: No  Items Reviewed: Did you receive and understand the discharge instructions provided?: Yes Medications obtained,verified, and reconciled?: Yes (Medications Reviewed) Any new allergies since your discharge?: No Dietary orders reviewed?: Yes Do you have support at home?: No  Medications Reviewed Today: Medications Reviewed Today     Reviewed by Darrall Ellison, LPN (Licensed Practical Nurse) on 07/11/23 at 1500  Med List Status: <None>   Medication Order Taking? Sig Documenting Provider Last Dose Status Informant  acetaminophen  (TYLENOL ) 500 MG tablet [id number] Yes Take 1,000 mg by mouth as needed for mild pain (pain score 1-3), headache or fever. [provider] Taking Active Self, Pharmacy Records  Cholecalciferol  (VITAMIN D3) 50 MCG (2000 UT) capsule [id number] Yes TAKE 1 CAPSULE (2,000 UNITS TOTAL) BY MOUTH DAILY. Plotnikov, Aleksei V, MD Taking Active Self, Pharmacy Records  clopidogrel  (PLAVIX ) 75 MG tablet [id number] Yes TAKE 1 TABLET EVERY DAY Carlene Che, MD Taking Active Self, Pharmacy Records           Med Note (LEE, NICOLE   Fri Apr 05, 2023 10:00 PM) Last dose 04/02/2023@noon   diclofenac Sodium (VOLTAREN) 1 % GEL [id number] Yes Apply 4 g topically 4 (four) times daily. Albertus Hughs, DO Taking Active   escitalopram  (LEXAPRO ) 5 MG tablet [id number] Yes Take 5 mg by mouth daily. [provider] Taking Active Self, Pharmacy Records            Med Note (TOUSEY, LAINE M   Tue Apr 09, 2023 12:23 PM) 04/09/23: Reports during [facility] call she is no longer taking   ezetimibe  (ZETIA ) 10 MG tablet [id number] Yes TAKE 1 TABLET EVERY DAY Plotnikov, Aleksei V, MD Taking Active Self, Pharmacy Records  methylPREDNISolone  (MEDROL  DOSEPAK) 4 MG TBPK tablet [id number]  Day 1: 8mg  before breakfast, 4 mg after lunch, 4 mg after supper, and 8 mg at bedtime Day 2: 4 mg before breakfast, 4 mg after lunch, 4 mg  after supper, and 8 mg  at bedtime Day 3:  4 mg  before breakfast, 4 mg  after lunch, 4 mg after supper, and 4 mg  at bedtime Day 4: 4 mg  before breakfast, 4 mg  after lunch, and 4 mg at bedtime Day 5: 4 mg  before breakfast and 4 mg at bedtime Day 6: 4 mg  before breakfast Floyd, Dan, DO  Active   metoprolol  succinate (TOPROL -XL) 100 MG 24 hr tablet [id number] Yes TAKE 1 TABLET DAILY WITH OR IMMEDIATELY FOLLOWING A MEAL. Plotnikov, Aleksei V, MD Taking Active Self, Pharmacy Records  Multiple Vitamin (MULTIVITAMIN) tablet [id number] Yes Take 1 tablet by mouth daily. [provider] Taking Active Self, Pharmacy Records  Olopatadine  HCl (PATADAY  OP) [id number] Yes Place 1 drop into both eyes daily as needed (For dry eyes). [provider] Taking Active Self, Pharmacy Records           Med Note Merlyn Starring, NICOLE   Fri Apr 05, 2023 10:02 PM) Last dose unknown-takes during allergy season  potassium chloride  (KLOR-CON ) 8 MEQ tablet [id number]  Take 1 tablet (8 mEq total) by mouth daily. Plotnikov, Aleksei V, MD  Active   RABEprazole  (ACIPHEX ) 20 MG tablet [id number]  Take 1 tablet (20 mg total) by mouth in the morning and at bedtime. Plotnikov, Aleksei V, MD  Active   valsartan  (DIOVAN ) 80 MG tablet [id number] Yes Take 1 tablet (80 mg total) by mouth daily. Plotnikov, Aleksei V, MD Taking Active             Home Care and Equipment/Supplies: Were Home Health Services Ordered?: NA Any new equipment or medical supplies ordered?: NA  Functional  Questionnaire: Do you need assistance with bathing/showering or dressing?: No Do you need assistance with meal preparation?: No Do you need assistance with eating?: No Do you have difficulty maintaining continence: No Do you need assistance with getting out of bed/getting out of a chair/moving?: No Do you have difficulty managing or taking your medications?: No  Follow up appointments reviewed: PCP Follow-up appointment confirmed?: Yes Date of PCP follow-up appointment?: 07/18/23 Follow-up Provider: [facility] Follow-up appointment confirmed?: No Reason Specialist Follow-Up Not Confirmed: Patient has Specialist Provider Number and will Call for Appointment Do you need transportation to your follow-up appointment?: No Do you understand care options if your condition(s) worsen?: Yes-patient verbalized understanding    SIGNATURE Darrall Ellison, LPN [facility] Nurse Health Advisor Direct Dial [phone]

## 2023-07-12 ENCOUNTER — Emergency Department (HOSPITAL_COMMUNITY)

## 2023-07-12 ENCOUNTER — Ambulatory Visit
Admission: EM | Admit: 2023-07-12 | Discharge: 2023-07-12 | Disposition: A | Discharge status: Home / Self Care | Attending: Emergency Medicine | Admitting: Emergency Medicine

## 2023-07-12 ENCOUNTER — Ambulatory Visit

## 2023-07-12 ENCOUNTER — Emergency Department (HOSPITAL_COMMUNITY)
Admission: EM | Admit: 2023-07-12 | Discharge: 2023-07-12 | Disposition: A | Discharge status: Home / Self Care | Attending: Emergency Medicine | Admitting: Emergency Medicine

## 2023-07-12 ENCOUNTER — Encounter (HOSPITAL_COMMUNITY): Payer: Self-pay

## 2023-07-12 ENCOUNTER — Other Ambulatory Visit: Payer: Self-pay

## 2023-07-12 ENCOUNTER — Encounter (HOSPITAL_COMMUNITY): Payer: Self-pay | Admitting: *Deleted

## 2023-07-12 DIAGNOSIS — R079 Chest pain, unspecified: Secondary | ICD-10-CM | POA: Diagnosis not present

## 2023-07-12 DIAGNOSIS — Z87891 Personal history of nicotine dependence: Secondary | ICD-10-CM | POA: Insufficient documentation

## 2023-07-12 DIAGNOSIS — W1830XA Fall on same level, unspecified, initial encounter: Secondary | ICD-10-CM | POA: Insufficient documentation

## 2023-07-12 DIAGNOSIS — I700 Atherosclerosis of aorta: Secondary | ICD-10-CM | POA: Diagnosis not present

## 2023-07-12 DIAGNOSIS — R55 Syncope and collapse: Secondary | ICD-10-CM | POA: Diagnosis not present

## 2023-07-12 DIAGNOSIS — R42 Dizziness and giddiness: Secondary | ICD-10-CM

## 2023-07-12 DIAGNOSIS — Y92009 Unspecified place in unspecified non-institutional (private) residence as the place of occurrence of the external cause: Secondary | ICD-10-CM | POA: Insufficient documentation

## 2023-07-12 DIAGNOSIS — S9032XA Contusion of left foot, initial encounter: Secondary | ICD-10-CM | POA: Diagnosis not present

## 2023-07-12 DIAGNOSIS — M79672 Pain in left foot: Secondary | ICD-10-CM | POA: Diagnosis not present

## 2023-07-12 DIAGNOSIS — Z8673 Personal history of transient ischemic attack (TIA), and cerebral infarction without residual deficits: Secondary | ICD-10-CM | POA: Diagnosis not present

## 2023-07-12 DIAGNOSIS — S99922A Unspecified injury of left foot, initial encounter: Secondary | ICD-10-CM | POA: Diagnosis present

## 2023-07-12 DIAGNOSIS — I10 Essential (primary) hypertension: Secondary | ICD-10-CM | POA: Diagnosis not present

## 2023-07-12 DIAGNOSIS — I771 Stricture of artery: Secondary | ICD-10-CM | POA: Diagnosis not present

## 2023-07-12 LAB — BASIC METABOLIC PANEL WITH GFR
Anion gap: 8 (ref 5–15)
BUN: 16 mg/dL (ref 8–23)
CO2: 23 mmol/L (ref 22–32)
Calcium: 9.5 mg/dL (ref 8.9–10.3)
Chloride: 110 mmol/L (ref 98–111)
Creatinine, Ser: 1.2 mg/dL — ABNORMAL HIGH (ref 0.44–1.00)
GFR, Estimated: 48 mL/min — ABNORMAL LOW (ref >60)
Glucose, Bld: 103 mg/dL — ABNORMAL HIGH (ref 70–99)
Potassium: 4.4 mmol/L (ref 3.5–5.1)
Sodium: 141 mmol/L (ref 135–145)

## 2023-07-12 LAB — TROPONIN I (HIGH SENSITIVITY)
Troponin I (High Sensitivity): 9 ng/L (ref <18)
Troponin I (High Sensitivity): 9 ng/L (ref <18)

## 2023-07-12 LAB — CBC
HCT: 39.3 % (ref 36.0–46.0)
Hemoglobin: 12.5 g/dL (ref 12.0–15.0)
MCH: 27.5 pg (ref 26.0–34.0)
MCHC: 31.8 g/dL (ref 30.0–36.0)
MCV: 86.4 fL (ref 80.0–100.0)
Platelets: 322 10*3/uL (ref 150–400)
RBC: 4.55 MIL/uL (ref 3.87–5.11)
RDW: 17.4 % — ABNORMAL HIGH (ref 11.5–15.5)
WBC: 5.5 10*3/uL (ref 4.0–10.5)
nRBC: 0 % (ref 0.0–0.2)

## 2023-07-12 MED ORDER — ONDANSETRON 8 MG PO TBDP
8.0000 mg | ORAL_TABLET | Freq: Once | ORAL | Status: AC
  Administered 2023-07-12: 8 mg via ORAL

## 2023-07-12 NOTE — ED Triage Notes (Signed)
 The pt is also c.o sob nausea and feeling lightheadedness today  she was seen at the eye doctors office also

## 2023-07-12 NOTE — ED Notes (Addendum)
 Patient is being discharged from the Urgent Care and sent to the Emergency Department via POV with family. Per White, NP, patient is in need of higher level of care due to syncope. Patient is aware and verbalizes understanding of plan of care.  Vitals:   07/12/23 1504  BP: (!) 148/90  Pulse: 95  Resp: 16  Temp: 97.8 F (36.6 C)  SpO2: 95%

## 2023-07-12 NOTE — Discharge Instructions (Addendum)
 Judy Carlson:  Thank you for allowing us  to take care of you today.  We hope you begin feeling better soon. You were seen today for L foot pain. XR do not show a fracture. Please call your surgeon regarding your carotid stenosis identified last visit. I have sent a referral.   To-Do:  Please follow-up with your primary doctor within the next 2-3 days. It is important that you review any labs or imaging results (if any) that you had today with them. Your preliminary imaging results (if any) are attached. Please return to the Emergency Department or call 911 if you experience chest pain, shortness of breath, severe pain, severe fever, altered mental status, or have any reason to think that you need emergency medical care.  Thank you again.  Hope you feel better soon.  Arminda Landmark, MD Department of Emergency Medicine

## 2023-07-12 NOTE — ED Provider Notes (Addendum)
 Patient presents for evaluation of left-sided foot pain and swelling beginning 2 days after a syncopal event in her home.  Endorses that foot got caught underneath the sink and at the time of ED evaluation pain and swelling not present.  Has been constant, exacerbated by standing with associated tingling.  Has attempted use of Tylenol  without relief.  Was prescribed methylprednisolone  but hesitant to use due to prior reaction to prednisone .  During clinical evaluation patient began experiencing lightheadedness and felt as if she was going to pass out.  Also endorsing shortness of breath at rest which was not experienced during initial event.  Patient has new symptom she is going to be escorted to the nearest emergency department for evaluation by family.  Vital signs are stable at this time, EKG shows normal sinus rhythm.   Reena Canning, NP 07/12/23 1549    Reena Canning, NP 07/12/23 [phone]

## 2023-07-12 NOTE — Telephone Encounter (Signed)
 I spoke w/Dr Candi Chafe Dizzy and tingling in the arm when tilting head to put her eyedrops in.  Pls sch OV to see me  Thx

## 2023-07-12 NOTE — ED Triage Notes (Signed)
 Patient presents to [facility] for left foot pain x 2 days. She fell on weds seen in the ED. States she developed pain to the lateral side of her foot. Treating pain with tylenol . Last dose 0800 today.

## 2023-07-12 NOTE — ED Triage Notes (Signed)
 The pt fell 2 days ago at home  she has had  lt foot pain since the fall

## 2023-07-12 NOTE — Telephone Encounter (Signed)
 Tried reaching pt to inform her of providers instructions as follows "I spoke w/Dr Candi Chafe Dizzy and tingling in the arm when tilting head to put her eyedrops in. Pls sch OV to see me"  Please schedule the pt upon her call back to the clinic.
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
## 2023-07-12 NOTE — Discharge Instructions (Addendum)
 Today you were evaluated for your foot pain  X-ray is pending, you will be notified of results via telephone  You may take Tylenol  every 6 hours as needed for pain  Compression wrap has been applied by this provider, may use as needed for stability and support  May elevate the foot whenever sitting and lying  If your symptoms continue to persist you may follow-up with podiatry for reevaluation

## 2023-07-17 ENCOUNTER — Ambulatory Visit: Payer: No Typology Code available for payment source | Admitting: Internal Medicine

## 2023-07-18 ENCOUNTER — Ambulatory Visit: Admitting: Internal Medicine

## 2023-07-18 ENCOUNTER — Encounter: Payer: Self-pay | Admitting: Internal Medicine

## 2023-07-18 VITALS — BP 112/84 | HR 82 | Temp 98.2°F | Ht 65.0 in | Wt 186.0 lb

## 2023-07-18 DIAGNOSIS — R202 Paresthesia of skin: Secondary | ICD-10-CM

## 2023-07-18 DIAGNOSIS — I6521 Occlusion and stenosis of right carotid artery: Secondary | ICD-10-CM | POA: Diagnosis not present

## 2023-07-18 DIAGNOSIS — R55 Syncope and collapse: Secondary | ICD-10-CM | POA: Diagnosis not present

## 2023-07-18 DIAGNOSIS — I10 Essential (primary) hypertension: Secondary | ICD-10-CM | POA: Diagnosis not present

## 2023-07-18 MED ORDER — RIVAROXABAN 2.5 MG PO TABS: 2.5000 mg | ORAL_TABLET | Freq: Two times a day (BID) | ORAL | 5 refills | Status: DC

## 2023-07-18 NOTE — Assessment & Plan Note (Signed)
 On Plavix . Start Xarelto. Sch appt w/Dr Edgardo Goodwill   07/10/2023 CT angio head and neck IMPRESSION: 1. No evidence of an acute intracranial abnormality on noncontrast head CT. 2. Interval right carotid endarterectomy. 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 3. Unchanged severe right and mild left vertebral artery origin stenoses. 4. Unchanged moderate bilateral ICA siphon stenoses. 5. New moderate left P2 stenosis. 6.  Aortic Atherosclerosis (ICD10-I70.0).

## 2023-07-18 NOTE — Assessment & Plan Note (Signed)
 On Valsartan , Toprol 

## 2023-07-18 NOTE — Progress Notes (Signed)
 Subjective:  Patient ID: Judy Carlson, female    DOB: April 20, 1950  Age: 73 y.o. MRN: [id number]  CC: Hospitalization Follow-up   HPI Judy Carlson presents for syncope and tingling of arms/ hands and legs when turning or tilting head. If standing and lean head backwards - legs get weak C-spine CT was ok  S/p right carotid endarterectomy on 01/12/2019 - Dr Edgardo Goodwill  Pt saw Dr Candi Chafe  Outpatient Medications Prior to Visit  Medication Sig Dispense Refill   acetaminophen  (TYLENOL ) 500 MG tablet Take 1,000 mg by mouth as needed for mild pain (pain score 1-3), headache or fever.     Cholecalciferol  (VITAMIN D3) 50 MCG (2000 UT) capsule TAKE 1 CAPSULE (2,000 UNITS TOTAL) BY MOUTH DAILY. 90 capsule 3   clopidogrel  (PLAVIX ) 75 MG tablet TAKE 1 TABLET EVERY DAY 90 tablet 3   diclofenac  Sodium (VOLTAREN ) 1 % GEL Apply 4 g topically 4 (four) times daily. 100 g 0   escitalopram  (LEXAPRO ) 5 MG tablet Take 5 mg by mouth daily.     ezetimibe  (ZETIA ) 10 MG tablet TAKE 1 TABLET EVERY DAY 90 tablet 3   metoprolol  succinate (TOPROL -XL) 100 MG 24 hr tablet TAKE 1 TABLET DAILY WITH OR IMMEDIATELY FOLLOWING A MEAL. 90 tablet 3   Multiple Vitamin (MULTIVITAMIN) tablet Take 1 tablet by mouth daily.     Olopatadine  HCl (PATADAY  OP) Place 1 drop into both eyes daily as needed (For dry eyes).     valsartan  (DIOVAN ) 80 MG tablet Take 1 tablet (80 mg total) by mouth daily. 30 tablet 1   methylPREDNISolone  (MEDROL  DOSEPAK) 4 MG TBPK tablet Day 1: 8mg  before breakfast, 4 mg after lunch, 4 mg after supper, and 8 mg at bedtime Day 2: 4 mg before breakfast, 4 mg after lunch, 4 mg  after supper, and 8 mg  at bedtime Day 3:  4 mg  before breakfast, 4 mg  after lunch, 4 mg after supper, and 4 mg  at bedtime Day 4: 4 mg  before breakfast, 4 mg  after lunch, and 4 mg at bedtime Day 5: 4 mg  before breakfast and 4 mg at bedtime Day 6: 4 mg  before breakfast 1 each 0   potassium chloride  (KLOR-CON ) 8 MEQ tablet Take 1 tablet (8  mEq total) by mouth daily. 30 tablet 3   RABEprazole  (ACIPHEX ) 20 MG tablet Take 1 tablet (20 mg total) by mouth in the morning and at bedtime. 60 tablet 5   No facility-administered medications prior to visit.    ROS: Review of Systems  Constitutional:  Negative for activity change, appetite change, chills, fatigue and unexpected weight change.  HENT:  Negative for congestion, mouth sores and sinus pressure.   Eyes:  Negative for visual disturbance.  Respiratory:  Negative for cough and chest tightness.   Gastrointestinal:  Negative for abdominal pain and nausea.  Genitourinary:  Negative for difficulty urinating, frequency and vaginal pain.  Musculoskeletal:  Negative for back pain and gait problem.  Skin:  Negative for pallor and rash.  Neurological:  Negative for dizziness, tremors, weakness, numbness and headaches.  Psychiatric/Behavioral:  Negative for confusion and sleep disturbance.     Objective:  BP 112/84   Pulse 82   Temp 98.2 F (36.8 C) (Oral)   Ht 5\' 5"  (1.651 m)   Wt 186 lb (84.4 kg)   SpO2 98%   BMI 30.95 kg/m   BP Readings from Last 3 Encounters:  07/18/23 112/84  07/12/23  136/75  07/12/23 (!) 148/90    Wt Readings from Last 3 Encounters:  07/18/23 186 lb (84.4 kg)  07/12/23 179 lb 14.3 oz (81.6 kg)  06/25/23 180 lb (81.6 kg)    Physical Exam Constitutional:      General: She is not in acute distress.    Appearance: She is well-developed.  HENT:     Head: Normocephalic.     Right Ear: External ear normal.     Left Ear: External ear normal.     Nose: Nose normal.  Eyes:     General:        Right eye: No discharge.        Left eye: No discharge.     Conjunctiva/sclera: Conjunctivae normal.     Pupils: Pupils are equal, round, and reactive to light.  Neck:     Thyroid : No thyromegaly.     Vascular: No JVD.     Trachea: No tracheal deviation.  Cardiovascular:     Rate and Rhythm: Normal rate and regular rhythm.     Heart sounds: Normal  heart sounds.  Pulmonary:     Effort: No respiratory distress.     Breath sounds: No stridor. No wheezing.  Abdominal:     General: Bowel sounds are normal. There is no distension.     Palpations: Abdomen is soft. There is no mass.     Tenderness: There is no abdominal tenderness. There is no guarding or rebound.  Musculoskeletal:        General: No tenderness.     Cervical back: Normal range of motion and neck supple. No rigidity.     Right lower leg: No edema.     Left lower leg: No edema.  Lymphadenopathy:     Cervical: No cervical adenopathy.  Skin:    Findings: No erythema or rash.  Neurological:     Cranial Nerves: No cranial nerve deficit.     Motor: No abnormal muscle tone.     Coordination: Coordination normal.     Deep Tendon Reflexes: Reflexes normal.  Psychiatric:        Behavior: Behavior normal.        Thought Content: Thought content normal.        Judgment: Judgment normal.  We are able to reproduce tingling in hands sx's w/head tilting No bradycardia     A total time of 45 minutes was spent preparing to see the patient, reviewing tests, x-rays, operative reports and other medical records.  Also, obtaining history and performing comprehensive physical exam.  Additionally, counseling the patient regarding the above listed issues.   Finally, documenting clinical information in the health records, coordination of care, educating the patient re: syncope, neuro symptoms, carotid blood clot. It is a complex case.  Lab Results  Component Value Date   WBC 5.5 07/12/2023   HGB 12.5 07/12/2023   HCT 39.3 07/12/2023   PLT 322 07/12/2023   GLUCOSE 103 (H) 07/12/2023   CHOL 255 (H) 12/14/2022   TRIG 174.0 (H) 12/14/2022   HDL 89.10 12/14/2022   LDLDIRECT 132.9 04/30/2007   LDLCALC 131 (H) 12/14/2022   ALT 23 07/10/2023   AST 33 07/10/2023   NA 141 07/12/2023   K 4.4 07/12/2023   CL 110 07/12/2023   CREATININE 1.20 (H) 07/12/2023   BUN 16 07/12/2023   CO2 23  07/12/2023   TSH 4.28 12/14/2022   INR 1.1 04/06/2023   HGBA1C 5.6 12/14/2022    DG Foot Complete Left  Result Date: 07/12/2023 CLINICAL DATA:  she fell Wednesday and injured her lt foot, painful EXAM: LEFT FOOT - COMPLETE 3+ VIEW COMPARISON:  None Available. FINDINGS: No acute fracture or dislocation. There is no evidence of arthropathy or other focal bone abnormality. Soft tissues are unremarkable. No radiopaque foreign body. IMPRESSION: No acute fracture or dislocation. Electronically Signed   By: Rance Burrows M.D.   On: 07/12/2023 19:29   DG Chest 2 View Result Date: 07/12/2023 CLINICAL DATA:  chest pain dizziness and feeling faint today EXAM: CHEST - 2 VIEW COMPARISON:  June 17, 2019 FINDINGS: No focal airspace consolidation, pleural effusion, or pneumothorax. No cardiomegaly. Tortuous aorta with aortic atherosclerosis. No acute fracture or destructive lesion. Multilevel thoracic osteophytosis. IMPRESSION: No acute cardiopulmonary abnormality. Electronically Signed   By: Rance Burrows M.D.   On: 07/12/2023 19:28    07/10/2023 CT angio head and neck IMPRESSION: 1. No evidence of an acute intracranial abnormality on noncontrast head CT. 2. Interval right carotid endarterectomy. 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 3. Unchanged severe right and mild left vertebral artery origin stenoses. 4. Unchanged moderate bilateral ICA siphon stenoses. 5. New moderate left P2 stenosis. 6.  Aortic Atherosclerosis (ICD10-I70.0).     Electronically Signed   By: Aundra Lee M.D.   On: 07/10/2023 14:43    Assessment & Plan:   Problem List Items Addressed This Visit     Essential hypertension, benign   On Valsartan , Toprol       Relevant Medications   rivaroxaban (XARELTO) 2.5 MG TABS tablet   Carotid artery thrombosis, right - Primary   On Plavix . Start Xarelto. Sch appt w/Dr Edgardo Goodwill   07/10/2023 CT angio head and neck IMPRESSION: 1. No evidence  of an acute intracranial abnormality on noncontrast head CT. 2. Interval right carotid endarterectomy. 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 3. Unchanged severe right and mild left vertebral artery origin stenoses. 4. Unchanged moderate bilateral ICA siphon stenoses. 5. New moderate left P2 stenosis. 6.  Aortic Atherosclerosis (ICD10-I70.0).      Relevant Medications   rivaroxaban (XARELTO) 2.5 MG TABS tablet   Other Relevant Orders   Ambulatory referral to Vascular Surgery   Syncopal episodes   On Plavix . Start Xarelto. Sch appt w/Dr Edgardo Goodwill (syncope and tingling of arms/ hands and legs when turning or tilting head. If standing and lean head backwards - legs get weak)  07/10/2023 CT angio head and neck IMPRESSION: 1. No evidence of an acute intracranial abnormality on noncontrast head CT. 2. Interval right carotid endarterectomy. 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 3. Unchanged severe right and mild left vertebral artery origin stenoses. 4. Unchanged moderate bilateral ICA siphon stenoses. 5. New moderate left P2 stenosis. 6.  Aortic Atherosclerosis (ICD10-I70.0).   We are able to reproduce tingling in hands sx's w/head tilting No bradycardia       Relevant Orders   Ambulatory referral to Vascular Surgery   Paresthesias   On Plavix . Start Xarelto. Sch appt w/Dr Edgardo Goodwill On MVI (syncope and tingling of arms/ hands and legs when turning or tilting head. If standing and lean head backwards - legs get weak)  07/10/2023 CT angio head and neck IMPRESSION: 1. No evidence of an acute intracranial abnormality on noncontrast head CT. 2. Interval right carotid endarterectomy. 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  3. Unchanged severe right and mild left vertebral artery origin stenoses. 4. Unchanged moderate bilateral ICA siphon stenoses. 5. New  moderate left P2 stenosis. 6.  Aortic Atherosclerosis (ICD10-I70.0).  We are able to reproduce tingling in hands sx's w/head tilting No bradycardia      Relevant Orders   Ambulatory referral to Vascular Surgery      Meds ordered this encounter  Medications   rivaroxaban (XARELTO) 2.5 MG TABS tablet    Sig: Take 1 tablet (2.5 mg total) by mouth 2 (two) times daily.    Dispense:  60 tablet    Refill:  5      Follow-up: Return in about 4 weeks (around 08/15/2023) for a follow-up visit.  Anitra Barn, MD

## 2023-07-18 NOTE — Assessment & Plan Note (Addendum)
 On Plavix . Start Xarelto. Sch appt w/Dr Edgardo Goodwill On MVI (syncope and tingling of arms/ hands and legs when turning or tilting head. If standing and lean head backwards - legs get weak)  07/10/2023 CT angio head and neck IMPRESSION: 1. No evidence of an acute intracranial abnormality on noncontrast head CT. 2. Interval right carotid endarterectomy. 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 3. Unchanged severe right and mild left vertebral artery origin stenoses. 4. Unchanged moderate bilateral ICA siphon stenoses. 5. New moderate left P2 stenosis. 6.  Aortic Atherosclerosis (ICD10-I70.0).  We are able to reproduce tingling in hands sx's w/head tilting No bradycardia

## 2023-07-18 NOTE — Assessment & Plan Note (Addendum)
 On Plavix . Start Xarelto. Sch appt w/Dr Edgardo Goodwill (syncope and tingling of arms/ hands and legs when turning or tilting head. If standing and lean head backwards - legs get weak)  07/10/2023 CT angio head and neck IMPRESSION: 1. No evidence of an acute intracranial abnormality on noncontrast head CT. 2. Interval right carotid endarterectomy. 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 3. Unchanged severe right and mild left vertebral artery origin stenoses. 4. Unchanged moderate bilateral ICA siphon stenoses. 5. New moderate left P2 stenosis. 6.  Aortic Atherosclerosis (ICD10-I70.0).   We are able to reproduce tingling in hands sx's w/head tilting No bradycardia

## 2023-07-19 ENCOUNTER — Ambulatory Visit: Admitting: Internal Medicine

## 2023-07-19 ENCOUNTER — Ambulatory Visit
Admission: RE | Admit: 2023-07-19 | Discharge: 2023-07-19 | Disposition: A | Discharge status: Home / Self Care | Source: Ambulatory Visit | Attending: Internal Medicine | Admitting: Internal Medicine

## 2023-07-19 ENCOUNTER — Telehealth: Payer: Self-pay

## 2023-07-19 DIAGNOSIS — Z122 Encounter for screening for malignant neoplasm of respiratory organs: Secondary | ICD-10-CM | POA: Diagnosis not present

## 2023-07-19 DIAGNOSIS — Z87891 Personal history of nicotine dependence: Secondary | ICD-10-CM

## 2023-07-19 DIAGNOSIS — F17210 Nicotine dependence, cigarettes, uncomplicated: Secondary | ICD-10-CM

## 2023-07-19 NOTE — Telephone Encounter (Addendum)
 This patient is appearing on a report for being at risk of failing the adherence measure for hypertension (ACEi/ARB) medications this calendar year.   Medication: Valsartan  80 mg daily Last fill date: 07/12/23 for 100 day supply  Insurance report was not up to date. Spoke with [facility] to confirm delivery on 07/17/23 to mailbox at 1012. No action needed at this time.   Abelina Abide, PharmD PGY1 Pharmacy Resident 07/19/2023 8:16 AM

## 2023-07-22 ENCOUNTER — Other Ambulatory Visit: Payer: Self-pay | Admitting: Internal Medicine

## 2023-07-22 ENCOUNTER — Ambulatory Visit: Admitting: Gastroenterology

## 2023-07-22 ENCOUNTER — Telehealth: Payer: Self-pay

## 2023-07-22 ENCOUNTER — Other Ambulatory Visit

## 2023-07-22 ENCOUNTER — Encounter: Payer: Self-pay | Admitting: Gastroenterology

## 2023-07-22 ENCOUNTER — Ambulatory Visit: Payer: Self-pay | Admitting: Gastroenterology

## 2023-07-22 VITALS — BP 150/90 | HR 66 | Ht 65.0 in | Wt 185.8 lb

## 2023-07-22 DIAGNOSIS — Z862 Personal history of diseases of the blood and blood-forming organs and certain disorders involving the immune mechanism: Secondary | ICD-10-CM | POA: Insufficient documentation

## 2023-07-22 DIAGNOSIS — K921 Melena: Secondary | ICD-10-CM

## 2023-07-22 DIAGNOSIS — I6521 Occlusion and stenosis of right carotid artery: Secondary | ICD-10-CM

## 2023-07-22 DIAGNOSIS — Z8711 Personal history of peptic ulcer disease: Secondary | ICD-10-CM

## 2023-07-22 LAB — CBC WITH DIFFERENTIAL/PLATELET
Basophils Absolute: 0.1 K/uL (ref 0.0–0.1)
Basophils Relative: 1 % (ref 0.0–3.0)
Eosinophils Absolute: 0.1 K/uL (ref 0.0–0.7)
Eosinophils Relative: 2.1 % (ref 0.0–5.0)
HCT: 39.6 % (ref 36.0–46.0)
Hemoglobin: 12.8 g/dL (ref 12.0–15.0)
Lymphocytes Relative: 24.1 % (ref 12.0–46.0)
Lymphs Abs: 1.2 K/uL (ref 0.7–4.0)
MCHC: 32.2 g/dL (ref 30.0–36.0)
MCV: 84.1 fl (ref 78.0–100.0)
Monocytes Absolute: 0.7 K/uL (ref 0.1–1.0)
Monocytes Relative: 13.1 % — ABNORMAL HIGH (ref 3.0–12.0)
Neutro Abs: 3 K/uL (ref 1.4–7.7)
Neutrophils Relative %: 59.7 % (ref 43.0–77.0)
Platelets: 364 K/uL (ref 150.0–400.0)
RBC: 4.71 Mil/uL (ref 3.87–5.11)
RDW: 17.3 % — ABNORMAL HIGH (ref 11.5–15.5)
WBC: 5.1 K/uL (ref 4.0–10.5)

## 2023-07-22 MED ORDER — PANTOPRAZOLE SODIUM 40 MG PO TBEC: 40.0000 mg | DELAYED_RELEASE_TABLET | Freq: Every day | ORAL | 3 refills | Status: DC

## 2023-07-22 NOTE — Telephone Encounter (Signed)
 Last Fill: 12/13/22  Last OV: 07/18/23 Next OV: 08/14/23  Routing to provider for review/authorization.
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
## 2023-07-22 NOTE — Patient Instructions (Signed)
 Your provider has requested that you go to the basement level for lab work before leaving today. Press "B" on the elevator. The lab is located at the first door on the left as you exit the elevator.  We have sent the following medications to your pharmacy for you to pick up at your convenience: pantoprazole  40 mg daily.   _______________________________________________________  If your blood pressure at your visit was 140/90 or greater, please contact your primary care physician to follow up on this.  _______________________________________________________  If you are age 73 or older, your body mass index should be between 23-30. Your Body mass index is 30.92 kg/m. If this is out of the aforementioned range listed, please consider follow up with your Primary Care Provider.  If you are age 72 or younger, your body mass index should be between 19-25. Your Body mass index is 30.92 kg/m. If this is out of the aformentioned range listed, please consider follow up with your Primary Care Provider.   ________________________________________________________  The [facility] providers would like to encourage you to use MYCHART to communicate with providers for non-urgent requests or questions.  Due to long hold times on the telephone, sending your provider a message by [facility] may be a faster and more efficient way to get a response.  Please allow 48 business hours for a response.  Please remember that this is for non-urgent requests.  _______________________________________________________

## 2023-07-22 NOTE — Telephone Encounter (Signed)
 Copied from CRM [id number]. Topic: Clinical - Prescription Issue >> Jul 19, 2023 11:56 AM Judy Carlson L wrote: Reason for CRM: patient stated that medication rivaroxaban  (XARELTO ) 2.5 MG TABS tablet is too expensive even with discounts and needs a cheaper brand to substitute and a call to discuss

## 2023-07-22 NOTE — Telephone Encounter (Unsigned)
 Copied from CRM [id number]. Topic: Clinical - Medication Refill >> Jul 22, 2023 10:28 AM Jenice Mitts wrote: Medication:  metoprolol  succinate (TOPROL -XL) 100 MG 24 hr tablet   Has the patient contacted their pharmacy? Yes (Agent: If no, request that the patient contact the pharmacy for the refill. If patient does not wish to contact the pharmacy document the reason why and proceed with request.) (Agent: If yes, when and what did the pharmacy advise?)  This is the patient's preferred pharmacy:  [id number] - [place] - [address] [place] ([place]) [place] [address] Phone: [phone] Fax: [phone]  Is this the correct pharmacy for this prescription? Yes If no, delete pharmacy and type the correct one.   Has the prescription been filled recently? No  Is the patient out of the medication? Yes  Has the patient been seen for an appointment in the last year OR does the patient have an upcoming appointment? Yes  Can we respond through MyChart? Yes  Agent: Please be advised that Rx refills may take up to 3 business days. We ask that you follow-up with your pharmacy.

## 2023-07-23 MED ORDER — METOPROLOL SUCCINATE ER 100 MG PO TB24: ORAL_TABLET | ORAL | 3 refills | Status: AC

## 2023-07-25 ENCOUNTER — Inpatient Hospital Stay (HOSPITAL_COMMUNITY)
Admission: EM | Admit: 2023-07-25 | Discharge: 2023-07-28 | DRG: 036 | Disposition: A | Discharge status: Home / Self Care | Attending: Internal Medicine | Admitting: Internal Medicine

## 2023-07-25 ENCOUNTER — Emergency Department (HOSPITAL_COMMUNITY)

## 2023-07-25 ENCOUNTER — Ambulatory Visit (INDEPENDENT_AMBULATORY_CARE_PROVIDER_SITE_OTHER): Admitting: Vascular Surgery

## 2023-07-25 ENCOUNTER — Encounter: Payer: Self-pay | Admitting: Vascular Surgery

## 2023-07-25 ENCOUNTER — Telehealth: Payer: Self-pay | Admitting: *Deleted

## 2023-07-25 VITALS — BP 179/77 | HR 72 | Ht 65.0 in | Wt 186.0 lb

## 2023-07-25 DIAGNOSIS — Z79899 Other long term (current) drug therapy: Secondary | ICD-10-CM

## 2023-07-25 DIAGNOSIS — Z7902 Long term (current) use of antithrombotics/antiplatelets: Secondary | ICD-10-CM

## 2023-07-25 DIAGNOSIS — I6521 Occlusion and stenosis of right carotid artery: Principal | ICD-10-CM

## 2023-07-25 DIAGNOSIS — I708 Atherosclerosis of other arteries: Secondary | ICD-10-CM | POA: Diagnosis not present

## 2023-07-25 DIAGNOSIS — Z888 Allergy status to other drugs, medicaments and biological substances status: Secondary | ICD-10-CM

## 2023-07-25 DIAGNOSIS — Z818 Family history of other mental and behavioral disorders: Secondary | ICD-10-CM

## 2023-07-25 DIAGNOSIS — I10 Essential (primary) hypertension: Secondary | ICD-10-CM | POA: Diagnosis present

## 2023-07-25 DIAGNOSIS — I6502 Occlusion and stenosis of left vertebral artery: Secondary | ICD-10-CM | POA: Diagnosis not present

## 2023-07-25 DIAGNOSIS — Z7901 Long term (current) use of anticoagulants: Secondary | ICD-10-CM

## 2023-07-25 DIAGNOSIS — Z8261 Family history of arthritis: Secondary | ICD-10-CM

## 2023-07-25 DIAGNOSIS — K219 Gastro-esophageal reflux disease without esophagitis: Secondary | ICD-10-CM | POA: Diagnosis present

## 2023-07-25 DIAGNOSIS — Z604 Social exclusion and rejection: Secondary | ICD-10-CM | POA: Diagnosis present

## 2023-07-25 DIAGNOSIS — Z860100 Personal history of colon polyps, unspecified: Secondary | ICD-10-CM

## 2023-07-25 DIAGNOSIS — Z87891 Personal history of nicotine dependence: Secondary | ICD-10-CM

## 2023-07-25 DIAGNOSIS — Z800 Family history of malignant neoplasm of digestive organs: Secondary | ICD-10-CM

## 2023-07-25 DIAGNOSIS — Z8249 Family history of ischemic heart disease and other diseases of the circulatory system: Secondary | ICD-10-CM

## 2023-07-25 DIAGNOSIS — R7303 Prediabetes: Secondary | ICD-10-CM | POA: Diagnosis present

## 2023-07-25 DIAGNOSIS — Z886 Allergy status to analgesic agent status: Secondary | ICD-10-CM

## 2023-07-25 DIAGNOSIS — Z881 Allergy status to other antibiotic agents status: Secondary | ICD-10-CM

## 2023-07-25 DIAGNOSIS — E669 Obesity, unspecified: Secondary | ICD-10-CM | POA: Diagnosis present

## 2023-07-25 DIAGNOSIS — Z833 Family history of diabetes mellitus: Secondary | ICD-10-CM

## 2023-07-25 DIAGNOSIS — I6523 Occlusion and stenosis of bilateral carotid arteries: Secondary | ICD-10-CM | POA: Diagnosis not present

## 2023-07-25 DIAGNOSIS — T447X6A Underdosing of beta-adrenoreceptor antagonists, initial encounter: Secondary | ICD-10-CM | POA: Diagnosis present

## 2023-07-25 DIAGNOSIS — F32A Depression, unspecified: Secondary | ICD-10-CM | POA: Diagnosis present

## 2023-07-25 DIAGNOSIS — Z7982 Long term (current) use of aspirin: Secondary | ICD-10-CM

## 2023-07-25 DIAGNOSIS — Z6830 Body mass index (BMI) 30.0-30.9, adult: Secondary | ICD-10-CM

## 2023-07-25 DIAGNOSIS — I779 Disorder of arteries and arterioles, unspecified: Secondary | ICD-10-CM | POA: Diagnosis not present

## 2023-07-25 DIAGNOSIS — E785 Hyperlipidemia, unspecified: Secondary | ICD-10-CM | POA: Diagnosis present

## 2023-07-25 DIAGNOSIS — Z8711 Personal history of peptic ulcer disease: Secondary | ICD-10-CM

## 2023-07-25 DIAGNOSIS — Z880 Allergy status to penicillin: Secondary | ICD-10-CM

## 2023-07-25 DIAGNOSIS — Z8673 Personal history of transient ischemic attack (TIA), and cerebral infarction without residual deficits: Secondary | ICD-10-CM

## 2023-07-25 DIAGNOSIS — I7300 Raynaud's syndrome without gangrene: Secondary | ICD-10-CM | POA: Diagnosis present

## 2023-07-25 DIAGNOSIS — Z820 Family history of epilepsy and other diseases of the nervous system: Secondary | ICD-10-CM

## 2023-07-25 DIAGNOSIS — J449 Chronic obstructive pulmonary disease, unspecified: Secondary | ICD-10-CM | POA: Diagnosis present

## 2023-07-25 DIAGNOSIS — Z807 Family history of other malignant neoplasms of lymphoid, hematopoietic and related tissues: Secondary | ICD-10-CM

## 2023-07-25 DIAGNOSIS — I6529 Occlusion and stenosis of unspecified carotid artery: Secondary | ICD-10-CM

## 2023-07-25 DIAGNOSIS — I129 Hypertensive chronic kidney disease with stage 1 through stage 4 chronic kidney disease, or unspecified chronic kidney disease: Secondary | ICD-10-CM | POA: Diagnosis present

## 2023-07-25 DIAGNOSIS — Z83438 Family history of other disorder of lipoprotein metabolism and other lipidemia: Secondary | ICD-10-CM

## 2023-07-25 DIAGNOSIS — I63011 Cerebral infarction due to thrombosis of right vertebral artery: Secondary | ICD-10-CM | POA: Diagnosis not present

## 2023-07-25 DIAGNOSIS — N1831 Chronic kidney disease, stage 3a: Secondary | ICD-10-CM | POA: Diagnosis present

## 2023-07-25 DIAGNOSIS — N183 Chronic kidney disease, stage 3 unspecified: Secondary | ICD-10-CM | POA: Diagnosis present

## 2023-07-25 DIAGNOSIS — Z66 Do not resuscitate: Secondary | ICD-10-CM | POA: Diagnosis present

## 2023-07-25 DIAGNOSIS — I672 Cerebral atherosclerosis: Secondary | ICD-10-CM | POA: Diagnosis not present

## 2023-07-25 LAB — CBC WITH DIFFERENTIAL/PLATELET
Abs Immature Granulocytes: 0.01 10*3/uL (ref 0.00–0.07)
Basophils Absolute: 0.1 10*3/uL (ref 0.0–0.1)
Basophils Relative: 1 %
Eosinophils Absolute: 0.1 10*3/uL (ref 0.0–0.5)
Eosinophils Relative: 3 %
HCT: 36.9 % (ref 36.0–46.0)
Hemoglobin: 12.1 g/dL (ref 12.0–15.0)
Immature Granulocytes: 0 %
Lymphocytes Relative: 30 %
Lymphs Abs: 1.4 10*3/uL (ref 0.7–4.0)
MCH: 27.5 pg (ref 26.0–34.0)
MCHC: 32.8 g/dL (ref 30.0–36.0)
MCV: 83.9 fL (ref 80.0–100.0)
Monocytes Absolute: 0.7 10*3/uL (ref 0.1–1.0)
Monocytes Relative: 14 %
Neutro Abs: 2.5 10*3/uL (ref 1.7–7.7)
Neutrophils Relative %: 52 %
Platelets: 357 10*3/uL (ref 150–400)
RBC: 4.4 MIL/uL (ref 3.87–5.11)
RDW: 16.6 % — ABNORMAL HIGH (ref 11.5–15.5)
WBC: 4.7 10*3/uL (ref 4.0–10.5)
nRBC: 0 % (ref 0.0–0.2)

## 2023-07-25 LAB — BASIC METABOLIC PANEL WITH GFR
Anion gap: 10 (ref 5–15)
BUN: 19 mg/dL (ref 8–23)
CO2: 22 mmol/L (ref 22–32)
Calcium: 9.4 mg/dL (ref 8.9–10.3)
Chloride: 107 mmol/L (ref 98–111)
Creatinine, Ser: 1.18 mg/dL — ABNORMAL HIGH (ref 0.44–1.00)
GFR, Estimated: 49 mL/min — ABNORMAL LOW (ref >60)
Glucose, Bld: 124 mg/dL — ABNORMAL HIGH (ref 70–99)
Potassium: 3.6 mmol/L (ref 3.5–5.1)
Sodium: 139 mmol/L (ref 135–145)

## 2023-07-25 LAB — I-STAT CHEM 8, ED
BUN: 24 mg/dL — ABNORMAL HIGH (ref 8–23)
Calcium, Ion: 1.25 mmol/L (ref 1.15–1.40)
Chloride: 108 mmol/L (ref 98–111)
Creatinine, Ser: 1.4 mg/dL — ABNORMAL HIGH (ref 0.44–1.00)
Glucose, Bld: 100 mg/dL — ABNORMAL HIGH (ref 70–99)
HCT: 35 % — ABNORMAL LOW (ref 36.0–46.0)
Hemoglobin: 11.9 g/dL — ABNORMAL LOW (ref 12.0–15.0)
Potassium: 3.7 mmol/L (ref 3.5–5.1)
Sodium: 142 mmol/L (ref 135–145)
TCO2: 22 mmol/L (ref 22–32)

## 2023-07-25 MED ORDER — HEPARIN BOLUS VIA INFUSION
2000.0000 [IU] | Freq: Once | INTRAVENOUS | Status: AC
  Administered 2023-07-25: 2000 [IU] via INTRAVENOUS
  Filled 2023-07-25: qty 2000

## 2023-07-25 MED ORDER — HEPARIN (PORCINE) 25000 UT/250ML-% IV SOLN
1200.0000 [IU]/h | INTRAVENOUS | Status: DC
  Administered 2023-07-25: 1200 [IU]/h via INTRAVENOUS
  Filled 2023-07-25: qty 250

## 2023-07-25 MED ORDER — IOHEXOL 350 MG/ML SOLN
75.0000 mL | Freq: Once | INTRAVENOUS | Status: AC | PRN
  Administered 2023-07-25: 75 mL via INTRAVENOUS

## 2023-07-25 MED FILL — Sodium Chloride IV Soln 0.9%: INTRAVENOUS | Qty: 1000 | Status: AC

## 2023-07-25 NOTE — ED Provider Notes (Signed)
 Assume Care - Medical Decision Making  Care of patient assumed from previous emergency medicine provider. See their note for further details of history, physical exam and plan.  Briefly, Judy Carlson is a 73 y.o. 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 Mental Status:  The patient is alert   Additional MDM/ED Course: Patient is moving all 4 extremities spontaneously.  She has no sensation deficits.  Repeat CTA reveals distal propagation of thrombus in the right carotid bulb region with persistent stenosis.  I discussed with vascular surgery who recommended admission to the hospital and initiation of heparin  infusion.  I discussed with the patient regarding heparin  infusion.  She states that she does have an episode of rectal bleeding in the past.  We discussed that there is a risk that she has another rectal bleed however the risk of stroke with thrombus and distal propagation is also a risk.  She would like to pursue with heparin  infusion.  I did discuss with hospitalist they will admit the patient to their service.  No further emergent interventions.   Arminda Landmark, MD 07/26/23 0001    Albertus Hughs, DO 07/26/23 1459

## 2023-07-25 NOTE — ED Provider Notes (Incomplete)
 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  Patient presenting today from the vascular office for evaluation to ensure her arterial thrombus is not getting worse.  She has been reporting more neck fullness intermittently but since diagnosis has had intermittent numbness and tingling in the right arm.  She was delayed getting her Xarelto  because of a mixup with the pharmacy and is now taken 3 total doses.  Also noted to be hypertensive here but has not had her morning medication.  She does report some intermittent visual issues in the right eye but reports that is been going on long before she had the diagnosis of the clot and does not feel that it is any different today and has been intermittent.   Almond Army, MD 07/29/23 [phone]

## 2023-07-25 NOTE — ED Provider Notes (Cosign Needed Addendum)
 [place] [facility] Provider Note   CSN: [id number] Arrival date & time: 07/25/23  1138     History Right carotid artery thrombus, history of stroke, HLD, HTN, diverticulosis, OA, tobacco use, anemia, depression, GERD No chief complaint on file.   Judy Carlson is a 73 y.o. female.  Patient seen this morning by VVS to follow-up on thrombus of right carotid artery seen on CT 07/10/2023.She was instructed to come to the ED d/t ongoing concerning symptoms for repeat CTA head and neck.  She notes intermittent feeling of fullness in R ear, swelling sensation of R neck which have been on going since 07/10/23. She also admits to intermittent blurry vision of R eye but states this is a chronic issue. She denies chest pain, SOB, fevers, chills or other sick symptoms.    She started xarelto  2 days ago but has not taken her morning dose today. Has not taken any medications today.          Home Medications Prior to Admission medications   Medication Sig Start Date End Date Taking? Authorizing Provider  acetaminophen  (TYLENOL ) 500 MG tablet Take 1,000 mg by mouth as needed for mild pain (pain score 1-3), headache or fever.    [provider]  Cholecalciferol  (VITAMIN D3) 50 MCG (2000 UT) capsule TAKE 1 CAPSULE (2,000 UNITS TOTAL) BY MOUTH DAILY. 03/14/22   Plotnikov, Oakley Bellman, MD  clopidogrel  (PLAVIX ) 75 MG tablet TAKE 1 TABLET EVERY DAY 12/14/22   Carlene Che, MD  ezetimibe  (ZETIA ) 10 MG tablet TAKE 1 TABLET EVERY DAY 03/05/23   Plotnikov, Aleksei V, MD  furosemide  (LASIX ) 20 MG tablet Take 20 mg by mouth.    [provider]  metoprolol  succinate (TOPROL -XL) 100 MG 24 hr tablet TAKE 1 TABLET DAILY WITH OR IMMEDIATELY FOLLOWING A MEAL. 07/23/23   Plotnikov, Aleksei V, MD  Multiple Vitamin (MULTIVITAMIN) tablet Take 1 tablet by mouth daily.    [provider]  Olopatadine  HCl (PATADAY  OP) Place 1 drop into both eyes daily as  needed (For dry eyes).    [provider]  pantoprazole  (PROTONIX ) 40 MG tablet Take 1 tablet (40 mg total) by mouth daily. 07/22/23   Zehr, Jessica D, PA-C  rivaroxaban  (XARELTO ) 2.5 MG TABS tablet Take 1 tablet (2.5 mg total) by mouth 2 (two) times daily. 07/18/23   Plotnikov, Aleksei V, MD  valsartan  (DIOVAN ) 80 MG tablet Take 1 tablet (80 mg total) by mouth daily. 06/27/23 06/26/24  Plotnikov, Aleksei V, MD      Allergies    Crestor  [rosuvastatin  calcium ], Doxycycline, Lipitor [atorvastatin ], Lisinopril, Naproxen, Nsaids, Pravastatin , Prednisone , Amoxicillin , Clindamycin /lincomycin, and Lovastatin     Review of Systems   Review of Systems  Physical Exam Updated Vital Signs There were no vitals taken for this visit. Physical Exam  ED Results / Procedures / Treatments   Labs (all labs ordered are listed, but only abnormal results are displayed) Labs Reviewed - No data to display  EKG None  Radiology No results found.  Procedures Procedures  None  Medications Ordered in ED Medications - No data to display  ED Course/ Medical Decision Making/ A&P   {   Medical Decision Making 73 year old female with PMH right carotid artery thrombus on Xarelto , history of stroke, HLD, HTN, diverticulosis, OA, tobacco use, anemia, depression, GERD presents with symptoms of right sided ear fullness and sensation of right neck swelling.  She was seen by VVS this morning to follow-up on  right carotid artery thrombus who recommended she come to the ED for reimaging to ensure thrombus has not expanded. In the ED, vitals notable for hypertension however patient states she has not taken her metoprolol  or Diovan  yet.  She has them with her in the ED and will take them along with her Xarelto . Exam benign and she is well-appearing. I-STAT Chem-8 shows baseline kidney function and CTA head and neck ordered. Patient was signed out to oncoming ED physician who will resume care and determine final  disposition.   Amount and/or Complexity of Data Reviewed Radiology: ordered.      Final Clinical Impression(s) / ED Diagnoses Final diagnoses:  None    Rx / DC Orders ED Discharge Orders     None         Glenn Lange, DO 07/25/23 1528    Glenn Lange, DO 07/25/23 1530    Almond Army, MD 07/29/23 [phone]
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
## 2023-07-25 NOTE — Telephone Encounter (Signed)
 The pt was ref to [facility]

## 2023-07-25 NOTE — Progress Notes (Unsigned)
 Care Guide Pharmacy Note  07/25/2023 Name: Judy Carlson MRN: [id number] DOB: [birth year]  Referred By: Genia Kettering, MD Reason for referral: Complex Care Management and Call Attempt #1 (Outreach to schedule referral with pharmacist )   Judy Carlson is a 73 y.o. year old female who is a primary care patient of Plotnikov, Oakley Bellman, MD.  Daryll Epp was referred to the pharmacist for assistance related to: Xarelto    An unsuccessful telephone outreach was attempted today to contact the patient who was referred to the pharmacy team for assistance with medication assistance. Additional attempts will be made to contact the patient.  Kandis Ormond, CMA [place]  [facility] Guide Direct Dial: [phone]  Fax: [phone] Website: [email or web address]
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
## 2023-07-25 NOTE — ED Notes (Signed)
 Shift report received from [facility], assumed care at this time.
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
## 2023-07-25 NOTE — ED Triage Notes (Addendum)
 Pt POV from doctors office, Dr. Edgardo Goodwill saw her today in [place] but sent her to ER for another CT for possible clot/blockage. Seen previously here for LOC on 5/7, was following up with PCP from that visit who referred her to Dr. Edgardo Goodwill. Aox4.

## 2023-07-26 ENCOUNTER — Inpatient Hospital Stay (HOSPITAL_COMMUNITY)

## 2023-07-26 ENCOUNTER — Other Ambulatory Visit: Payer: Self-pay

## 2023-07-26 ENCOUNTER — Encounter (HOSPITAL_COMMUNITY): Payer: Self-pay | Admitting: Internal Medicine

## 2023-07-26 ENCOUNTER — Encounter (HOSPITAL_COMMUNITY)
Admission: EM | Disposition: A | Discharge status: Home / Self Care | Payer: Self-pay | Source: Home / Self Care | Attending: Internal Medicine

## 2023-07-26 DIAGNOSIS — Z9889 Other specified postprocedural states: Secondary | ICD-10-CM

## 2023-07-26 DIAGNOSIS — Z7902 Long term (current) use of antithrombotics/antiplatelets: Secondary | ICD-10-CM | POA: Diagnosis not present

## 2023-07-26 DIAGNOSIS — Z888 Allergy status to other drugs, medicaments and biological substances status: Secondary | ICD-10-CM | POA: Diagnosis not present

## 2023-07-26 DIAGNOSIS — I779 Disorder of arteries and arterioles, unspecified: Secondary | ICD-10-CM | POA: Diagnosis not present

## 2023-07-26 DIAGNOSIS — Z8673 Personal history of transient ischemic attack (TIA), and cerebral infarction without residual deficits: Secondary | ICD-10-CM | POA: Diagnosis not present

## 2023-07-26 DIAGNOSIS — I6521 Occlusion and stenosis of right carotid artery: Principal | ICD-10-CM

## 2023-07-26 DIAGNOSIS — Z87891 Personal history of nicotine dependence: Secondary | ICD-10-CM

## 2023-07-26 DIAGNOSIS — T447X6A Underdosing of beta-adrenoreceptor antagonists, initial encounter: Secondary | ICD-10-CM | POA: Diagnosis not present

## 2023-07-26 DIAGNOSIS — Z66 Do not resuscitate: Secondary | ICD-10-CM | POA: Diagnosis not present

## 2023-07-26 DIAGNOSIS — J449 Chronic obstructive pulmonary disease, unspecified: Secondary | ICD-10-CM

## 2023-07-26 DIAGNOSIS — Z7901 Long term (current) use of anticoagulants: Secondary | ICD-10-CM | POA: Diagnosis not present

## 2023-07-26 DIAGNOSIS — Z886 Allergy status to analgesic agent status: Secondary | ICD-10-CM | POA: Diagnosis not present

## 2023-07-26 DIAGNOSIS — Z860100 Personal history of colon polyps, unspecified: Secondary | ICD-10-CM | POA: Diagnosis not present

## 2023-07-26 DIAGNOSIS — N1831 Chronic kidney disease, stage 3a: Secondary | ICD-10-CM | POA: Diagnosis not present

## 2023-07-26 DIAGNOSIS — F32A Depression, unspecified: Secondary | ICD-10-CM | POA: Diagnosis not present

## 2023-07-26 DIAGNOSIS — Z833 Family history of diabetes mellitus: Secondary | ICD-10-CM | POA: Diagnosis not present

## 2023-07-26 DIAGNOSIS — I10 Essential (primary) hypertension: Secondary | ICD-10-CM

## 2023-07-26 DIAGNOSIS — I129 Hypertensive chronic kidney disease with stage 1 through stage 4 chronic kidney disease, or unspecified chronic kidney disease: Secondary | ICD-10-CM | POA: Diagnosis not present

## 2023-07-26 DIAGNOSIS — Z7982 Long term (current) use of aspirin: Secondary | ICD-10-CM | POA: Diagnosis not present

## 2023-07-26 DIAGNOSIS — N183 Chronic kidney disease, stage 3 unspecified: Secondary | ICD-10-CM | POA: Diagnosis not present

## 2023-07-26 DIAGNOSIS — K219 Gastro-esophageal reflux disease without esophagitis: Secondary | ICD-10-CM | POA: Diagnosis not present

## 2023-07-26 DIAGNOSIS — I7300 Raynaud's syndrome without gangrene: Secondary | ICD-10-CM | POA: Diagnosis not present

## 2023-07-26 DIAGNOSIS — Z881 Allergy status to other antibiotic agents status: Secondary | ICD-10-CM | POA: Diagnosis not present

## 2023-07-26 DIAGNOSIS — Z79899 Other long term (current) drug therapy: Secondary | ICD-10-CM | POA: Diagnosis not present

## 2023-07-26 DIAGNOSIS — Z8249 Family history of ischemic heart disease and other diseases of the circulatory system: Secondary | ICD-10-CM | POA: Diagnosis not present

## 2023-07-26 DIAGNOSIS — E785 Hyperlipidemia, unspecified: Secondary | ICD-10-CM | POA: Diagnosis not present

## 2023-07-26 DIAGNOSIS — R7303 Prediabetes: Secondary | ICD-10-CM | POA: Diagnosis not present

## 2023-07-26 DIAGNOSIS — E669 Obesity, unspecified: Secondary | ICD-10-CM | POA: Diagnosis not present

## 2023-07-26 HISTORY — PX: TRANSCAROTID ARTERY REVASCULARIZATIONÂ: SHX6778

## 2023-07-26 LAB — CBC
HCT: 36.1 % (ref 36.0–46.0)
Hemoglobin: 11.5 g/dL — ABNORMAL LOW (ref 12.0–15.0)
MCH: 26.9 pg (ref 26.0–34.0)
MCHC: 31.9 g/dL (ref 30.0–36.0)
MCV: 84.3 fL (ref 80.0–100.0)
Platelets: 351 10*3/uL (ref 150–400)
RBC: 4.28 MIL/uL (ref 3.87–5.11)
RDW: 16.6 % — ABNORMAL HIGH (ref 11.5–15.5)
WBC: 5.1 10*3/uL (ref 4.0–10.5)
nRBC: 0 % (ref 0.0–0.2)

## 2023-07-26 LAB — APTT
aPTT: >200 s (ref 24–36)
aPTT: 199 s (ref 24–36)
aPTT: 52 s — ABNORMAL HIGH (ref 24–36)

## 2023-07-26 LAB — POCT ACTIVATED CLOTTING TIME
Activated Clotting Time: 302 s
Activated Clotting Time: 164 s
Activated Clotting Time: 331 s

## 2023-07-26 LAB — SURGICAL PCR SCREEN
MRSA, PCR: NEGATIVE
Staphylococcus aureus: NEGATIVE

## 2023-07-26 LAB — TYPE AND SCREEN
ABO/RH(D): A POS
Antibody Screen: NEGATIVE

## 2023-07-26 SURGERY — TRANSCAROTID ARTERY REVASCULARIZATION (TCAR)
Anesthesia: General | Laterality: Right

## 2023-07-26 MED ORDER — ASPIRIN 81 MG PO TBEC
81.0000 mg | DELAYED_RELEASE_TABLET | Freq: Every day | ORAL | Status: DC
  Administered 2023-07-27 – 2023-07-28 (×2): 81 mg via ORAL
  Filled 2023-07-26 (×2): qty 1

## 2023-07-26 MED ORDER — MEPERIDINE HCL 25 MG/ML IJ SOLN: 6.2500 mg | INTRAMUSCULAR | Status: DC | PRN

## 2023-07-26 MED ORDER — PROTAMINE SULFATE 10 MG/ML IV SOLN
INTRAVENOUS | Status: DC | PRN
  Administered 2023-07-26: 50 mg via INTRAVENOUS

## 2023-07-26 MED ORDER — ALUM & MAG HYDROXIDE-SIMETH 200-200-20 MG/5ML PO SUSP: 15.0000 mL | ORAL | Status: DC | PRN

## 2023-07-26 MED ORDER — SURGIFLO WITH THROMBIN (HEMOSTATIC MATRIX KIT) OPTIME
TOPICAL | Status: DC | PRN
  Administered 2023-07-26: 1 via TOPICAL

## 2023-07-26 MED ORDER — LACTATED RINGERS IV SOLN: INTRAVENOUS | Status: DC

## 2023-07-26 MED ORDER — ROCURONIUM BROMIDE 10 MG/ML (PF) SYRINGE
PREFILLED_SYRINGE | INTRAVENOUS | Status: DC | PRN
  Administered 2023-07-26: 100 mg via INTRAVENOUS

## 2023-07-26 MED ORDER — ACETAMINOPHEN 325 MG PO TABS
325.0000 mg | ORAL_TABLET | ORAL | Status: DC | PRN
  Administered 2023-07-28: 650 mg via ORAL
  Filled 2023-07-26: qty 2

## 2023-07-26 MED ORDER — HEPARIN SODIUM (PORCINE) 1000 UNIT/ML IJ SOLN
INTRAMUSCULAR | Status: AC
  Filled 2023-07-26: qty 10

## 2023-07-26 MED ORDER — ACETAMINOPHEN 325 MG PO TABS: 650.0000 mg | ORAL_TABLET | Freq: Four times a day (QID) | ORAL | Status: DC | PRN

## 2023-07-26 MED ORDER — LABETALOL HCL 5 MG/ML IV SOLN
INTRAVENOUS | Status: AC
  Filled 2023-07-26: qty 4

## 2023-07-26 MED ORDER — ONDANSETRON HCL 4 MG/2ML IJ SOLN: 4.0000 mg | Freq: Once | INTRAMUSCULAR | Status: DC | PRN

## 2023-07-26 MED ORDER — OXYCODONE HCL 5 MG/5ML PO SOLN: 5.0000 mg | Freq: Once | ORAL | Status: DC | PRN

## 2023-07-26 MED ORDER — OXYCODONE HCL 5 MG PO TABS: 5.0000 mg | ORAL_TABLET | Freq: Once | ORAL | Status: DC | PRN

## 2023-07-26 MED ORDER — MAGNESIUM SULFATE 2 GM/50ML IV SOLN: 2.0000 g | Freq: Every day | INTRAVENOUS | Status: DC | PRN

## 2023-07-26 MED ORDER — MORPHINE SULFATE (PF) 2 MG/ML IV SOLN
2.0000 mg | INTRAVENOUS | Status: DC | PRN
  Administered 2023-07-26: 2 mg via INTRAVENOUS
  Filled 2023-07-26: qty 1

## 2023-07-26 MED ORDER — CEFAZOLIN SODIUM 1 G IJ SOLR
INTRAMUSCULAR | Status: AC
  Filled 2023-07-26: qty 20

## 2023-07-26 MED ORDER — ONDANSETRON HCL 4 MG/2ML IJ SOLN
INTRAMUSCULAR | Status: AC
  Filled 2023-07-26: qty 2

## 2023-07-26 MED ORDER — HEPARIN 6000 UNIT IRRIGATION SOLUTION
Status: AC
  Filled 2023-07-26: qty 500

## 2023-07-26 MED ORDER — CLOPIDOGREL BISULFATE 75 MG PO TABS
75.0000 mg | ORAL_TABLET | Freq: Every day | ORAL | Status: DC
  Administered 2023-07-26 – 2023-07-28 (×3): 75 mg via ORAL
  Filled 2023-07-26 (×3): qty 1

## 2023-07-26 MED ORDER — PROPOFOL 10 MG/ML IV BOLUS
INTRAVENOUS | Status: DC | PRN
  Administered 2023-07-26: 100 mg via INTRAVENOUS
  Administered 2023-07-26: 50 mg via INTRAVENOUS

## 2023-07-26 MED ORDER — LIDOCAINE 2% (20 MG/ML) 5 ML SYRINGE
INTRAMUSCULAR | Status: AC
  Filled 2023-07-26: qty 5

## 2023-07-26 MED ORDER — CLEVIDIPINE BUTYRATE 0.5 MG/ML IV EMUL
INTRAVENOUS | Status: DC | PRN
Start: 2023-07-26 — End: 2023-07-26
  Administered 2023-07-26: 1 mg/h via INTRAVENOUS

## 2023-07-26 MED ORDER — ASPIRIN 325 MG PO TBEC
DELAYED_RELEASE_TABLET | ORAL | Status: AC
  Filled 2023-07-26: qty 1

## 2023-07-26 MED ORDER — CEFAZOLIN SODIUM-DEXTROSE 2-3 GM-%(50ML) IV SOLR
INTRAVENOUS | Status: DC | PRN
  Administered 2023-07-26: 2 g via INTRAVENOUS

## 2023-07-26 MED ORDER — LIDOCAINE-EPINEPHRINE (PF) 1 %-1:200000 IJ SOLN
INTRAMUSCULAR | Status: AC
  Filled 2023-07-26: qty 30

## 2023-07-26 MED ORDER — SODIUM CHLORIDE 0.9 % IV SOLN: 500.0000 mL | Freq: Once | INTRAVENOUS | Status: DC | PRN

## 2023-07-26 MED ORDER — ACETAMINOPHEN 650 MG RE SUPP: 650.0000 mg | Freq: Four times a day (QID) | RECTAL | Status: DC | PRN

## 2023-07-26 MED ORDER — GLYCOPYRROLATE PF 0.2 MG/ML IJ SOSY
PREFILLED_SYRINGE | INTRAMUSCULAR | Status: AC
  Filled 2023-07-26: qty 1

## 2023-07-26 MED ORDER — FENTANYL CITRATE (PF) 100 MCG/2ML IJ SOLN: 25.0000 ug | INTRAMUSCULAR | Status: DC | PRN

## 2023-07-26 MED ORDER — 0.9 % SODIUM CHLORIDE (POUR BTL) OPTIME
TOPICAL | Status: DC | PRN
  Administered 2023-07-26: 1000 mL

## 2023-07-26 MED ORDER — PHENOL 1.4 % MT LIQD: 1.0000 | OROMUCOSAL | Status: DC | PRN

## 2023-07-26 MED ORDER — MIDAZOLAM HCL 2 MG/2ML IJ SOLN
1.0000 mg | Freq: Once | INTRAMUSCULAR | Status: AC
  Administered 2023-07-26: 1 mg via INTRAVENOUS

## 2023-07-26 MED ORDER — PHENYLEPHRINE 80 MCG/ML (10ML) SYRINGE FOR IV PUSH (FOR BLOOD PRESSURE SUPPORT)
PREFILLED_SYRINGE | INTRAVENOUS | Status: AC
  Filled 2023-07-26: qty 10

## 2023-07-26 MED ORDER — CHLORHEXIDINE GLUCONATE 0.12 % MT SOLN
OROMUCOSAL | Status: AC
  Administered 2023-07-26: 15 mL via OROMUCOSAL
  Filled 2023-07-26: qty 15

## 2023-07-26 MED ORDER — ASPIRIN 81 MG PO CHEW
325.0000 mg | CHEWABLE_TABLET | Freq: Once | ORAL | Status: AC
  Administered 2023-07-26: 325 mg via ORAL

## 2023-07-26 MED ORDER — LIDOCAINE-EPINEPHRINE (PF) 1 %-1:200000 IJ SOLN
INTRAMUSCULAR | Status: DC | PRN
  Administered 2023-07-26: 5 mL

## 2023-07-26 MED ORDER — LABETALOL HCL 5 MG/ML IV SOLN
INTRAVENOUS | Status: DC | PRN
  Administered 2023-07-26 (×4): 5 mg via INTRAVENOUS

## 2023-07-26 MED ORDER — HEPARIN (PORCINE) 25000 UT/250ML-% IV SOLN
1000.0000 [IU]/h | INTRAVENOUS | Status: DC
  Administered 2023-07-26: 1000 [IU]/h via INTRAVENOUS

## 2023-07-26 MED ORDER — ORAL CARE MOUTH RINSE: 15.0000 mL | Freq: Once | OROMUCOSAL | Status: AC

## 2023-07-26 MED ORDER — ACETAMINOPHEN 650 MG RE SUPP: 325.0000 mg | RECTAL | Status: DC | PRN

## 2023-07-26 MED ORDER — FENTANYL CITRATE (PF) 250 MCG/5ML IJ SOLN
INTRAMUSCULAR | Status: DC | PRN
  Administered 2023-07-26: 25 ug via INTRAVENOUS
  Administered 2023-07-26: 50 ug via INTRAVENOUS
  Administered 2023-07-26: 25 ug via INTRAVENOUS
  Administered 2023-07-26: 100 ug via INTRAVENOUS
  Administered 2023-07-26: 50 ug via INTRAVENOUS

## 2023-07-26 MED ORDER — DEXAMETHASONE SODIUM PHOSPHATE 10 MG/ML IJ SOLN
INTRAMUSCULAR | Status: AC
  Filled 2023-07-26: qty 1

## 2023-07-26 MED ORDER — METOPROLOL SUCCINATE ER 100 MG PO TB24
100.0000 mg | ORAL_TABLET | Freq: Every day | ORAL | Status: DC
  Administered 2023-07-26 – 2023-07-28 (×3): 100 mg via ORAL
  Filled 2023-07-26 (×2): qty 1
  Filled 2023-07-26: qty 4

## 2023-07-26 MED ORDER — LABETALOL HCL 5 MG/ML IV SOLN: 10.0000 mg | INTRAVENOUS | Status: DC | PRN

## 2023-07-26 MED ORDER — HEPARIN SODIUM (PORCINE) 1000 UNIT/ML IJ SOLN
INTRAMUSCULAR | Status: DC | PRN
  Administered 2023-07-26: 2000 [IU] via INTRAVENOUS
  Administered 2023-07-26: 8000 [IU] via INTRAVENOUS

## 2023-07-26 MED ORDER — SODIUM CHLORIDE 0.9 % IV SOLN: INTRAVENOUS | Status: AC

## 2023-07-26 MED ORDER — POTASSIUM CHLORIDE CRYS ER 20 MEQ PO TBCR: 20.0000 meq | EXTENDED_RELEASE_TABLET | Freq: Every day | ORAL | Status: DC | PRN

## 2023-07-26 MED ORDER — HEMOSTATIC AGENTS (NO CHARGE) OPTIME
TOPICAL | Status: DC | PRN
  Administered 2023-07-26: 2 via TOPICAL

## 2023-07-26 MED ORDER — EZETIMIBE 10 MG PO TABS
10.0000 mg | ORAL_TABLET | Freq: Every day | ORAL | Status: DC
  Administered 2023-07-26 – 2023-07-28 (×3): 10 mg via ORAL
  Filled 2023-07-26 (×3): qty 1

## 2023-07-26 MED ORDER — PROPOFOL 10 MG/ML IV BOLUS
INTRAVENOUS | Status: AC
  Filled 2023-07-26: qty 20

## 2023-07-26 MED ORDER — HEPARIN 6000 UNIT IRRIGATION SOLUTION
Status: DC | PRN
  Administered 2023-07-26: 1

## 2023-07-26 MED ORDER — ROCURONIUM BROMIDE 10 MG/ML (PF) SYRINGE
PREFILLED_SYRINGE | INTRAVENOUS | Status: AC
  Filled 2023-07-26: qty 10

## 2023-07-26 MED ORDER — ONDANSETRON HCL 4 MG/2ML IJ SOLN
INTRAMUSCULAR | Status: DC | PRN
  Administered 2023-07-26: 4 mg via INTRAVENOUS

## 2023-07-26 MED ORDER — EPHEDRINE 5 MG/ML INJ
INTRAVENOUS | Status: AC
  Filled 2023-07-26: qty 5

## 2023-07-26 MED ORDER — IODIXANOL 320 MG/ML IV SOLN
INTRAVENOUS | Status: DC | PRN
  Administered 2023-07-26: 24 mL

## 2023-07-26 MED ORDER — PROTAMINE SULFATE 10 MG/ML IV SOLN
INTRAVENOUS | Status: AC
  Filled 2023-07-26: qty 5

## 2023-07-26 MED ORDER — DEXAMETHASONE SODIUM PHOSPHATE 10 MG/ML IJ SOLN
INTRAMUSCULAR | Status: DC | PRN
  Administered 2023-07-26: 10 mg via INTRAVENOUS

## 2023-07-26 MED ORDER — LIDOCAINE HCL (PF) 1 % IJ SOLN
INTRAMUSCULAR | Status: AC
  Filled 2023-07-26: qty 30

## 2023-07-26 MED ORDER — OXYCODONE-ACETAMINOPHEN 5-325 MG PO TABS: 1.0000 | ORAL_TABLET | ORAL | Status: DC | PRN

## 2023-07-26 MED ORDER — GLYCOPYRROLATE 0.2 MG/ML IJ SOLN
INTRAMUSCULAR | Status: DC | PRN
Start: 2023-07-26 — End: 2023-07-26
  Administered 2023-07-26: .2 mg via INTRAVENOUS

## 2023-07-26 MED ORDER — ONDANSETRON HCL 4 MG/2ML IJ SOLN: 4.0000 mg | Freq: Four times a day (QID) | INTRAMUSCULAR | Status: DC | PRN

## 2023-07-26 MED ORDER — METOPROLOL TARTRATE 5 MG/5ML IV SOLN: 2.0000 mg | INTRAVENOUS | Status: DC | PRN

## 2023-07-26 MED ORDER — PHENYLEPHRINE HCL-NACL 20-0.9 MG/250ML-% IV SOLN
INTRAVENOUS | Status: DC | PRN
  Administered 2023-07-26: 40 ug/min via INTRAVENOUS

## 2023-07-26 MED ORDER — SUGAMMADEX SODIUM 200 MG/2ML IV SOLN
INTRAVENOUS | Status: DC | PRN
  Administered 2023-07-26: 200 mg via INTRAVENOUS

## 2023-07-26 MED ORDER — CHLORHEXIDINE GLUCONATE 0.12 % MT SOLN: 15.0000 mL | Freq: Once | OROMUCOSAL | Status: AC

## 2023-07-26 MED ORDER — CEFAZOLIN SODIUM-DEXTROSE 2-4 GM/100ML-% IV SOLN
2.0000 g | Freq: Three times a day (TID) | INTRAVENOUS | Status: AC
  Administered 2023-07-26 – 2023-07-27 (×2): 2 g via INTRAVENOUS
  Filled 2023-07-26 (×2): qty 100

## 2023-07-26 MED ORDER — PHENYLEPHRINE 80 MCG/ML (10ML) SYRINGE FOR IV PUSH (FOR BLOOD PRESSURE SUPPORT)
PREFILLED_SYRINGE | INTRAVENOUS | Status: DC | PRN
  Administered 2023-07-26: 120 ug via INTRAVENOUS
  Administered 2023-07-26: 80 ug via INTRAVENOUS
  Administered 2023-07-26: 120 ug via INTRAVENOUS
  Administered 2023-07-26 (×5): 80 ug via INTRAVENOUS

## 2023-07-26 MED ORDER — HYDRALAZINE HCL 20 MG/ML IJ SOLN: 5.0000 mg | INTRAMUSCULAR | Status: DC | PRN

## 2023-07-26 MED ORDER — DOCUSATE SODIUM 100 MG PO CAPS
100.0000 mg | ORAL_CAPSULE | Freq: Every day | ORAL | Status: DC
  Administered 2023-07-27 – 2023-07-28 (×2): 100 mg via ORAL
  Filled 2023-07-26 (×2): qty 1

## 2023-07-26 MED ORDER — EPHEDRINE SULFATE-NACL 50-0.9 MG/10ML-% IV SOSY
PREFILLED_SYRINGE | INTRAVENOUS | Status: DC | PRN
Start: 2023-07-26 — End: 2023-07-26
  Administered 2023-07-26: 10 mg via INTRAVENOUS

## 2023-07-26 MED ORDER — IRBESARTAN 75 MG PO TABS: 75.0000 mg | ORAL_TABLET | Freq: Every day | ORAL | Status: DC

## 2023-07-26 MED ORDER — FENTANYL CITRATE (PF) 250 MCG/5ML IJ SOLN
INTRAMUSCULAR | Status: AC
  Filled 2023-07-26: qty 5

## 2023-07-26 MED ORDER — GUAIFENESIN-DM 100-10 MG/5ML PO SYRP: 15.0000 mL | ORAL_SOLUTION | ORAL | Status: DC | PRN

## 2023-07-26 MED ORDER — MIDAZOLAM HCL 2 MG/2ML IJ SOLN
INTRAMUSCULAR | Status: AC
  Filled 2023-07-26: qty 2

## 2023-07-26 MED ORDER — LIDOCAINE 2% (20 MG/ML) 5 ML SYRINGE
INTRAMUSCULAR | Status: DC | PRN
  Administered 2023-07-26: 100 mg via INTRAVENOUS

## 2023-07-26 MED ORDER — PANTOPRAZOLE SODIUM 40 MG PO TBEC
40.0000 mg | DELAYED_RELEASE_TABLET | Freq: Every day | ORAL | Status: DC
  Administered 2023-07-26 – 2023-07-28 (×3): 40 mg via ORAL
  Filled 2023-07-26 (×3): qty 1

## 2023-07-26 SURGICAL SUPPLY — 38 items
BAG COUNTER SPONGE SURGICOUNT (BAG) ×2 IMPLANT
CANISTER SUCTION 3000ML PPV (SUCTIONS) ×2 IMPLANT
CLIP TI MEDIUM 6 (CLIP) ×4 IMPLANT
CLIP TI WIDE RED SMALL 6 (CLIP) ×4 IMPLANT
COVER PROBE W GEL 5X96 (DRAPES) ×2 IMPLANT
COVER TRANSDUCER ULTRASND GEL (DISPOSABLE) IMPLANT
DERMABOND ADVANCED .7 DNX12 (GAUZE/BANDAGES/DRESSINGS) ×2 IMPLANT
DRAPE FEMORAL ANGIO 80X135IN (DRAPES) ×2 IMPLANT
ELECTRODE REM PT RTRN 9FT ADLT (ELECTROSURGICAL) ×2 IMPLANT
GOWN STRL REUS W/ TWL LRG LVL3 (GOWN DISPOSABLE) ×4 IMPLANT
GOWN STRL REUS W/TWL 2XL LVL3 (GOWN DISPOSABLE) ×4 IMPLANT
GUIDEWIRE ENROUTE 0.014 (WIRE) ×2 IMPLANT
HEMOSTAT SNOW SURGICEL 2X4 (HEMOSTASIS) IMPLANT
KIT BASIN OR (CUSTOM PROCEDURE TRAY) ×2 IMPLANT
KIT ENCORE 26 ADVANTAGE (KITS) ×2 IMPLANT
KIT INTRODUCER GALT 7 (INTRODUCER) ×2 IMPLANT
KIT TURNOVER KIT B (KITS) ×2 IMPLANT
NDL HYPO 25GX1X1/2 BEV (NEEDLE) IMPLANT
NEEDLE HYPO 25GX1X1/2 BEV (NEEDLE) IMPLANT
PACK CAROTID (CUSTOM PROCEDURE TRAY) ×2 IMPLANT
POSITIONER HEAD DONUT 9IN (MISCELLANEOUS) ×2 IMPLANT
SET MICROPUNCTURE 5F STIFF (MISCELLANEOUS) ×2 IMPLANT
STATION PROTECTION PRESSURIZED (MISCELLANEOUS) ×2 IMPLANT
STENT TRANSCAROTID SYS 10X40 (Permanent Stent) ×3 IMPLANT
SUT MNCRL AB 4-0 PS2 18 (SUTURE) ×2 IMPLANT
SUT PROLENE 5 0 C 1 24 (SUTURE) ×2 IMPLANT
SUT PROLENE 6 0 BV (SUTURE) IMPLANT
SUT SILK 2 0 PERMA HAND 18 BK (SUTURE) IMPLANT
SUT SILK 2 0 SH (SUTURE) ×2 IMPLANT
SUT SILK 3-0 18XBRD TIE 12 (SUTURE) IMPLANT
SUT VIC AB 3-0 SH 27X BRD (SUTURE) ×2 IMPLANT
SYR 10ML LL (SYRINGE) ×6 IMPLANT
SYR 20ML LL LF (SYRINGE) ×2 IMPLANT
SYR CONTROL 10ML LL (SYRINGE) IMPLANT
SYSTEM TRANSCAROTID NEUROPRTCT (MISCELLANEOUS) ×2 IMPLANT
TOWEL GREEN STERILE (TOWEL DISPOSABLE) ×2 IMPLANT
WATER STERILE IRR 1000ML POUR (IV SOLUTION) ×2 IMPLANT
WIRE BENTSON .035X145CM (WIRE) ×2 IMPLANT

## 2023-07-26 NOTE — Op Note (Signed)
 NAME: Judy Carlson    MRN: [id number] DOB: 19-Jul-1950    DATE OF OPERATION: 07/26/2023  PREOP DIAGNOSIS:    Right internal carotid artery thrombus  POSTOP DIAGNOSIS:    Same  PROCEDURE:    Right transcarotid artery revascularization, primary repair of the right common carotid artery  SURGEON: Kayla Part  ASSIST: Cordie Deters, PA  ANESTHESIA: General  EBL: 25 mL  INDICATIONS:    Judy Carlson is a 73 y.o. female with prior history of right-sided CEA for symptomatic ICA stenosis.  She presented to the outpatient office with CT scan demonstrating thrombus within the right ICA.  Follow-up scan also demonstrated thrombus in the ED.  The lesion was very concerning, and high risk for embolization.  She was taken today for transcarotid artery revascularization  FINDINGS:   Greater than 95% stenosis of the right internal carotid artery with thrombus appreciated  TECHNIQUE:   The patient was brought to the operating room, where support lines were placed and general anesthesia was secured. The right neck and left groin were prepped and the patient was sterilely draped. A transverse 2-4 cm incision was made between the sternal and clavicular heads of the sternocleidomastoid muscle, below the omohyoid. Following longitudinal division of the carotid sheath the jugular vein was partially dissected and retracted medially. Once 3 cm of common carotid artery (CCA) were isolated, umbilical tape was placed around the proximal 1/3 of the CCA under direct vision. A 5.0 polypropylene suture was pre-placed in the anterior wall of the CCA, in a "U stitch" configuration, close to the clavicle to facilitate hemostasis upon removal of the arterial sheath at completion of the TCAR procedure.  The contralateral (left) common femoral vein (CFV) was accessed under ultrasound guidance, using standard Seldinger and micropuncture access technique. Permanent recorded image(s) was/were saved in the  patient's medical record. The Venous Return Sheath was advanced into the CFV over the 0.035" wire provided. Blood was aspirated from the flow line followed by flushing of the Venous Sheath with heparinized saline. The Venous Sheath was secured to the patient's skin with suture to maintain optimal position in the vessel.  Heparin  was given to obtain a therapeutic activated clotting time >250 seconds prior to arterial access. A 4-French non-stiffened ENHANCE Transcarotid / Peripheral Access set was used, puncturing the artery with the 21G needle through the pre-placed "U" stitch while holding gentle traction on the umbilical tape to stabilize and centralize the CCA within the incision. Careful attention was paid to the change in CCA shape when using the umbilical tape to control or lift the artery. The micropuncture wire was then advanced 3-4 cm into the CCA and, the 21G needle was removed. The micropuncture sheath was advanced 2-3 cm into the CCA and the wire and dilator were removed. Pulsatile backflow indicated correct positioning. The provided 0.035" J-tipped guidewire was inserted as close as possible to the bifurcation without engaging the lesion. After micropuncture sheath removal, the Transcarotid Arterial Sheath was advanced to the 2.5cm marker and the 0.035" wire and dilator were then removed. Arterial Sheath position was assessed under fluoroscopy in two projections to ensure that the sheath tip was oriented coaxially in the CCA. The Arterial Sheath was sutured to the patient with gentle forward tension. Blood was slowly aspirated followed by flushing with heparinized saline. No ingress of air bubbles through the passive hemostatic valve was observed. The stopcocks were closed. Traction applied to the CCA previously to facilitate access was gently released.  The Flow Controller was connected to the Transcarotid Arterial Sheath, prepared by passively allowing a column of arterial blood to fill the line  and connected to the Venous Return Sheath. CCA inflow was occluded proximal to the arteriotomy with a vascular clamp to achieve active flow reversal. To confirm flow reversal, a saline bolus was delivered into the venous flow line on both "High" and "Low" flow settings of the Flow Controller. Angiograms were performed with slow injections of a small amount of contrast filling just past the lesion to minimize antegrade transmission of micro-bubbles.  Prior to lesion manipulation, heart rate (70bpm) and systolic BP (140-150mmHg) were managed upwards to optimize flow reversal and procedural neuroprotection. The lesion was crossed with an 0.014" ENROUTE guidewire and pre-dilation of the lesion was performed with a 6mm x 30mm rapid exchange 0.014" compatible balloon catheter to 8 atmospheres for 10 seconds. Stenting was performed with an 10mm x 40mm ENROUTE Transcarotid stent, sized appropriately to the right CCA, followed by another 10 mm x 40 mm en route transcarotid stent to limit any toothpasting of the thrombus through the stent interstices. AP and lateral angiograms (gentle contrast injections) were performed to confirm stent placement and arterial wall stent apposition.  There appeared to be some thrombus inferior, therefore another 10 x 40 mm and round stent was placed.  Follow-up angiography demonstrated excellent result with resolution of stenosis.  No thrombus in the distal vessels appreciated.  At [facility] case completion, antegrade flow was restored by releasing the clamp on the CCA then closing the NPS stopcocks to the flow lines. The Transcarotid Arterial Sheath was removed and the pre-closure suture was tied. There is a signal discrepancy with Doppler ultrasound, therefore duplex ultrasound was used to visualize the access site.  There appeared to be a luminal irregularity.  The U-stitch was removed, artery clamped, and opened via a transverse arteriotomy.  A dissection flap was appreciated.  This was  tacked up using interrupted 6-0 Prolene suture without issue. Heparin  reversal was employed.  The wound bed was closed in layers using 3-0 Vicryl suture with Monocryl and Dermabond at level of the skin.  The Venous Return Sheath was removed and hemostasis was achieved with brief manual compression.  The patient tolerated the procedure well and was extubated on the table. The patient was moving all four extremities to command prior to transfer to the recovery room.   Kayla Part, MD [facility] and Vein Special[facility] of [facility] DATE OF DICTATION:   07/26/2023

## 2023-07-26 NOTE — Plan of Care (Signed)
  Problem: Education: Goal: Knowledge of General Education information will improve Description: Including pain rating scale, medication(s)/side effects and non-pharmacologic comfort measures Outcome: Progressing   Problem: Health Behavior/Discharge Planning: Goal: Ability to manage health-related needs will improve Outcome: Progressing   Problem: Clinical Measurements: Goal: Ability to maintain clinical measurements within normal limits will improve Outcome: Progressing Goal: Will remain free from infection Outcome: Progressing Goal: Diagnostic test results will improve Outcome: Progressing Goal: Respiratory complications will improve Outcome: Progressing Goal: Cardiovascular complication will be avoided Outcome: Progressing   Problem: Activity: Goal: Risk for activity intolerance will decrease Outcome: Progressing   Problem: Nutrition: Goal: Adequate nutrition will be maintained Outcome: Progressing   Problem: Coping: Goal: Level of anxiety will decrease Outcome: Progressing   Problem: Elimination: Goal: Will not experience complications related to bowel motility Outcome: Progressing Goal: Will not experience complications related to urinary retention Outcome: Progressing   Problem: Pain Managment: Goal: General experience of comfort will improve and/or be controlled Outcome: Progressing   Problem: Safety: Goal: Ability to remain free from injury will improve Outcome: Progressing   Problem: Skin Integrity: Goal: Risk for impaired skin integrity will decrease Outcome: Progressing   Problem: Education: Goal: Knowledge of discharge needs will improve Outcome: Progressing   Problem: Clinical Measurements: Goal: Postoperative complications will be avoided or minimized Outcome: Progressing   Problem: Respiratory: Goal: Will achieve and/or maintain a regular respiratory rate, without signs or symptoms of dyspnea Outcome: Progressing   Problem: Skin  Integrity: Goal: Demonstration of wound healing without infection will improve Outcome: Progressing   Problem: Education: Goal: Knowledge of disease or condition will improve Outcome: Progressing Goal: Knowledge of secondary prevention will improve (MUST DOCUMENT ALL) Outcome: Progressing Goal: Knowledge of patient specific risk factors will improve (DELETE if not current risk factor) Outcome: Progressing   Problem: Ischemic Stroke/TIA Tissue Perfusion: Goal: Complications of ischemic stroke/TIA will be minimized Outcome: Progressing   Problem: Coping: Goal: Will verbalize positive feelings about self Outcome: Progressing Goal: Will identify appropriate support needs Outcome: Progressing   Problem: Health Behavior/Discharge Planning: Goal: Ability to manage health-related needs will improve Outcome: Progressing Goal: Goals will be collaboratively established with patient/family Outcome: Progressing   Problem: Self-Care: Goal: Ability to participate in self-care as condition permits will improve Outcome: Progressing Goal: Verbalization of feelings and concerns over difficulty with self-care will improve Outcome: Progressing Goal: Ability to communicate needs accurately will improve Outcome: Progressing   Problem: Nutrition: Goal: Risk of aspiration will decrease Outcome: Progressing Goal: Dietary intake will improve Outcome: Progressing

## 2023-07-26 NOTE — Progress Notes (Addendum)
 Same day note   Judy Carlson is a 73 y.o. female with medical history significant of carotid artery disease status post right CEA in 01/2019, hypertension, hyperlipidemia, CVA, CKD stage IIIa, prediabetes, COPD, Raynaud's syndrome, nephrolithiasis, PUD, duodenitis, mood disorder was seen in the ED on 07/10/2023 for near syncope.  CT angiogram done at that time was showing a thrombus in the right carotid artery causing 75% stenosis.  Patient was then discharged on Xarelto .  She was then seen by vascular surgery due to ongoing symptoms and was sent back to the ED for CT angiogram of the head and neck.  On arrival patient was hypotensive.  Repeat CTA head and neck showing mild interval distal propagation of thrombus in the right carotid bulb region with persistent severe stenosis of the proximal ICA of 75% or greater. Vascular surgery  recommended starting IV heparin  and intervention next day.  Patient was then admitted hospital for further evaluation treatment.  Patient seen and examined at bedside.  Patient was admitted to the hospital for right carotid thrombus.  At the time of my evaluation, patient complains of none at this time  Physical examination reveals obese built female.  Not in obvious distress no focal deficits.  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  Vascular surgery has followed the patient and plan for carotid intervention today.  Continue IV heparin , NPO.  Will start normal saline for IV hydration.  Hypertension On metoprolol .  Blood pressure better.   Hyperlipidemia Continue Zetia .   History of CVA Continue Plavix  and Zetia .   CKD stage IIIa Creatinine stable, monitor renal function.   Prediabetes Previous A1c values <6.5.   COPD Stable, no signs of acute exacerbation.  Patient is not on any inhalers at home.   History of PUD Continue  Protonix .  DVT prophylaxis On IV heparin  Spoke with the patient's friend at bedside.  No Charge  Signed,  Lindwood Rhody, MD [facility]

## 2023-07-26 NOTE — ED Notes (Signed)
 Spoke with pharmacy regarding aPTT result with possible error due to draw site. New aPTT ordered and will be collected by phlebotomy from alternate site.
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
## 2023-07-26 NOTE — Progress Notes (Signed)
 Patient received from PACU, AO x4. Denies any pain. Vital stable at the time of transfer. CHG completed, connected to tele and CCMD notified. Oriented pt to room and call bell system. Call bell within reach, plan of care continues.
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
## 2023-07-26 NOTE — Plan of Care (Signed)
  Problem: Education: Goal: Knowledge of General Education information will improve Description: Including pain rating scale, medication(s)/side effects and non-pharmacologic comfort measures Outcome: Progressing   Problem: Clinical Measurements: Goal: Ability to maintain clinical measurements within normal limits will improve Outcome: Progressing Goal: Will remain free from infection Outcome: Progressing Goal: Diagnostic test results will improve Outcome: Progressing Goal: Respiratory complications will improve Outcome: Progressing Goal: Cardiovascular complication will be avoided Outcome: Progressing   Problem: Ischemic Stroke/TIA Tissue Perfusion: Goal: Complications of ischemic stroke/TIA will be minimized Outcome: Progressing   

## 2023-07-26 NOTE — Anesthesia Procedure Notes (Signed)
 Procedure Name: Intubation Date/Time: 07/26/2023 2:10 PM  Performed by: Grier Leber, CRNAPre-anesthesia Checklist: Patient identified, Emergency Drugs available, Suction available and Patient being monitored Patient Re-evaluated:Patient Re-evaluated prior to induction Oxygen Delivery Method: Circle System Utilized Preoxygenation: Pre-oxygenation with 100% oxygen Induction Type: IV induction Ventilation: Mask ventilation without difficulty Laryngoscope Size: Miller and 2 Grade View: Grade I Tube type: Oral Tube size: 7.0 mm Number of attempts: 1 Airway Equipment and Method: Stylet Placement Confirmation: ETT inserted through vocal cords under direct vision, positive ETCO2 and breath sounds checked- equal and bilateral Secured at: 23 cm Tube secured with: Tape Dental Injury: Teeth and Oropharynx as per pre-operative assessment

## 2023-07-26 NOTE — Anesthesia Preprocedure Evaluation (Addendum)
 Anesthesia Evaluation  Patient identified by MRN, date of birth, ID band Patient awake    Reviewed: Allergy & Precautions, NPO status , Patient's Chart, lab work & pertinent test results  History of Anesthesia Complications (+) history of anesthetic complications  Airway Mallampati: I  TM Distance: >3 FB Neck ROM: Full    Dental no notable dental hx. (+) Teeth Intact, Dental Advisory Given   Pulmonary COPD, Patient abstained from smoking., former smoker   Pulmonary exam normal        Cardiovascular hypertension, Pt. on medications and Pt. on home beta blockers  Rhythm:Regular Rate:Normal  ECHO 2020  1. Left ventricular ejection fraction, by visual estimation, is 60 to  65%. The left ventricle has normal function. There is no left ventricular  hypertrophy.   2. Left ventricular diastolic function could not be evaluated pattern of  LV diastolic filling.   3. Global right ventricle has normal systolic function.The right  ventricular size is normal. Mildly increased right ventricular wall  thickness.   4. Left atrial size was not well visualized.   5. Right atrial size was not well visualized.   6. The mitral valve is grossly normal. Trace mitral valve regurgitation.   7. The tricuspid valve is grossly normal. Tricuspid valve regurgitation  is trivial.   8. The aortic valve is tricuspid Aortic valve regurgitation was not  visualized by color flow Doppler. Structurally normal aortic valve, with  no evidence of sclerosis or stenosis.   9. The pulmonic valve was not well visualized. Pulmonic valve  regurgitation was not assessed by color flow Doppler.  10. The inferior vena cava is normal in size with greater than 50%  respiratory variability, suggesting right atrial pressure of 3 mmHg.      Neuro/Psych  PSYCHIATRIC DISORDERS Anxiety Depression     Neuromuscular disease CVA negative neurological ROS     GI/Hepatic Neg liver  ROS, PUD,GERD  ,,  Endo/Other  negative endocrine ROS    Renal/GU Renal disease  negative genitourinary   Musculoskeletal  (+) Arthritis , Osteoarthritis,    Abdominal Normal abdominal exam  (+)   Peds  Hematology  (+) Blood dyscrasia, anemia   Anesthesia Other Findings   Reproductive/Obstetrics                             Anesthesia Physical Anesthesia Plan  ASA: 3  Anesthesia Plan: General   Post-op Pain Management: Minimal or no pain anticipated   Induction: Intravenous  PONV Risk Score and Plan: 3 and Ondansetron  and Dexamethasone   Airway Management Planned: Oral ETT  Additional Equipment: Arterial line  Intra-op Plan:   Post-operative Plan: Extubation in OR  Informed Consent: I have reviewed the patients History and Physical, chart, labs and discussed the procedure including the risks, benefits and alternatives for the proposed anesthesia with the patient or authorized representative who has indicated his/her understanding and acceptance.     Dental advisory given  Plan Discussed with: CRNA and Anesthesiologist  Anesthesia Plan Comments:        Anesthesia Quick Evaluation

## 2023-07-26 NOTE — Progress Notes (Signed)
  Daily Progress Note   Subjective: No complaints this morning, neuro intact  Objective: Vitals:   07/26/23 0245 07/26/23 0440  BP: (!) 145/87 138/79  Pulse: 67 72  Resp:  18  Temp:  98.1 F (36.7 C)  SpO2: 98% 99%    Physical Examination HDS Nonlabored breathing Neuro intact  ASSESSMENT/PLAN:  73 y.o. 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 Continues to be fully asymptomatic at this time. Repeat CTA head and neck with stable thrombus in the right ICA Reviewed risks and benefits of carotid intervention with Dr Rosalva Comber today   Philipp Brawn MD [facility] [phone] 07/26/2023  7:51 AM

## 2023-07-26 NOTE — Hospital Course (Addendum)
 Same day note   Judy Carlson is a 73 y.o. female with medical history significant of carotid artery disease status post right CEA in 01/2019, hypertension, hyperlipidemia, CVA, CKD stage IIIa, prediabetes, COPD, Raynaud's syndrome, nephrolithiasis, PUD, duodenitis, mood disorder was seen in the ED on 07/10/2023 for near syncope.  CT angiogram done at that time was showing a thrombus in the right carotid artery causing 75% stenosis.  Patient was then discharged on Xarelto .  She was then seen by vascular surgery due to ongoing symptoms and was sent back to the ED for CT angiogram of the head and neck.  On arrival patient was hypotensive.  Repeat CTA head and neck showing mild interval distal propagation of thrombus in the right carotid bulb region with persistent severe stenosis of the proximal ICA of 75% or greater. Vascular surgery  recommended starting IV heparin  and intervention next day.  Patient was then admitted hospital for further evaluation treatment.  Patient seen and examined at bedside.  Patient was admitted to the hospital for right carotid thrombus.  At the time of my evaluation, patient complains of none at this time  Physical examination reveals obese built female.  Not in obvious distress no focal deficits.  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  Vascular surgery has followed the patient and plan for intervention today.  Continue IV heparin , NPO.    Hypertension On metoprolol .  Blood pressure better.   Hyperlipidemia Continue Zetia .   History of CVA Continue Plavix  and Zetia .   CKD stage IIIa Creatinine stable, monitor renal  function.   Prediabetes Previous A1c values <6.5.   COPD Stable, no signs of acute exacerbation.  Patient is not on any inhalers at home.   History of PUD Continue Protonix .  DVT prophylaxis On IV heparin  Spoke with the patient's friend at bedside.  No Charge  Signed,  Lindwood Rhody, MD [facility]

## 2023-07-26 NOTE — Transfer of Care (Signed)
 Immediate Anesthesia Transfer of Care Note  Patient: Judy Carlson  Procedure(s) Performed: Selmer Dally ARTERY REVASCULARIZATION (TCAR) (Right)  Patient Location: PACU  Anesthesia Type:General  Level of Consciousness: awake, alert , oriented, and patient cooperative  Airway & Oxygen Therapy: Patient Spontanous Breathing and Patient connected to nasal cannula oxygen  Post-op Assessment: Report given to RN, Post -op Vital signs reviewed and stable, and Patient moving all extremities X 4  Post vital signs: Reviewed and stable  Last Vitals:  Vitals Value Taken Time  BP 149/89 07/26/23 1620  Temp    Pulse 82 07/26/23 1627  Resp 10 07/26/23 1627  SpO2 100 % 07/26/23 1627  Vitals shown include unfiled device data.  Last Pain:  Vitals:   07/26/23 1313  TempSrc:   PainSc: 0-No pain         Complications: No notable events documented.

## 2023-07-26 NOTE — Progress Notes (Signed)
 Patient was seen and examined in preoperative holding.  In short, Judy Carlson is a 73 year old female with history of right-sided CVA for amaurosis which was performed in 2020 by my partner Dr. Ena Harries early.  She presented to the outpatient clinic yesterday citing a syncopal event 3 weeks ago.  She underwent CT scan.  Initially, the right ICA was deemed mural thrombus, however on further inspection, this appears to be thrombus that has a target sign.  This is very concerning, therefore the patient was asked to present to the ED for repeat scan to see if the lesion had changed.  I did not.  She presents to preoperative holding today for management of the internal carotid thrombus. This lesion is very concerning.  I am amazed that she has not had a stroke from the lesion.  Management options include repeat endarterectomy versus transcarotid artery revascularization and dropping the thrombus behind the stent.  I evaluated the lesion with my partners.  The lesion is very high, and we do not, I think that there is a very high likelihood for nerve injury.  They agree.  After discussing the risks and benefits of left-sided transcarotid artery revascularization in an effort to effort manage thrombus within the right internal carotid artery, Judy Carlson elected to proceed.   I quoted a very high stroke risk.    Kayla Part MD

## 2023-07-27 ENCOUNTER — Other Ambulatory Visit (HOSPITAL_COMMUNITY): Payer: Self-pay

## 2023-07-27 DIAGNOSIS — N1831 Chronic kidney disease, stage 3a: Secondary | ICD-10-CM

## 2023-07-27 DIAGNOSIS — I10 Essential (primary) hypertension: Secondary | ICD-10-CM

## 2023-07-27 DIAGNOSIS — I6521 Occlusion and stenosis of right carotid artery: Secondary | ICD-10-CM | POA: Diagnosis not present

## 2023-07-27 LAB — CBC
HCT: 32.1 % — ABNORMAL LOW (ref 36.0–46.0)
Hemoglobin: 10.5 g/dL — ABNORMAL LOW (ref 12.0–15.0)
MCH: 27.1 pg (ref 26.0–34.0)
MCHC: 32.7 g/dL (ref 30.0–36.0)
MCV: 82.9 fL (ref 80.0–100.0)
Platelets: 310 10*3/uL (ref 150–400)
RBC: 3.87 MIL/uL (ref 3.87–5.11)
RDW: 16.3 % — ABNORMAL HIGH (ref 11.5–15.5)
WBC: 8.7 10*3/uL (ref 4.0–10.5)
nRBC: 0 % (ref 0.0–0.2)

## 2023-07-27 LAB — BASIC METABOLIC PANEL WITH GFR
Anion gap: 12 (ref 5–15)
BUN: 19 mg/dL (ref 8–23)
CO2: 19 mmol/L — ABNORMAL LOW (ref 22–32)
Calcium: 9.3 mg/dL (ref 8.9–10.3)
Chloride: 109 mmol/L (ref 98–111)
Creatinine, Ser: 1.3 mg/dL — ABNORMAL HIGH (ref 0.44–1.00)
GFR, Estimated: 43 mL/min — ABNORMAL LOW (ref >60)
Glucose, Bld: 127 mg/dL — ABNORMAL HIGH (ref 70–99)
Potassium: 4.3 mmol/L (ref 3.5–5.1)
Sodium: 140 mmol/L (ref 135–145)

## 2023-07-27 LAB — LIPID PANEL
Cholesterol: 238 mg/dL — ABNORMAL HIGH (ref 0–200)
HDL: 58 mg/dL (ref >40)
LDL Cholesterol: 161 mg/dL — ABNORMAL HIGH (ref 0–99)
Total CHOL/HDL Ratio: 4.1 ratio
Triglycerides: 97 mg/dL (ref <150)
VLDL: 19 mg/dL (ref 0–40)

## 2023-07-27 LAB — APTT: aPTT: 31 s (ref 24–36)

## 2023-07-27 LAB — MAGNESIUM: Magnesium: 1.7 mg/dL (ref 1.7–2.4)

## 2023-07-27 MED ORDER — HYDRALAZINE HCL 10 MG PO TABS: 10.0000 mg | ORAL_TABLET | Freq: Three times a day (TID) | ORAL | Status: DC

## 2023-07-27 MED ORDER — FUROSEMIDE 20 MG PO TABS
20.0000 mg | ORAL_TABLET | Freq: Every day | ORAL | Status: DC
  Administered 2023-07-27 – 2023-07-28 (×2): 20 mg via ORAL
  Filled 2023-07-27 (×2): qty 1

## 2023-07-27 MED ORDER — PRAVASTATIN SODIUM 40 MG PO TABS: 40.0000 mg | ORAL_TABLET | Freq: Every day | ORAL | Status: DC

## 2023-07-27 MED ORDER — OLOPATADINE HCL 0.1 % OP SOLN
1.0000 [drp] | Freq: Every day | OPHTHALMIC | Status: DC | PRN
  Filled 2023-07-27: qty 5

## 2023-07-27 MED ORDER — HYDRALAZINE HCL 20 MG/ML IJ SOLN: 5.0000 mg | INTRAMUSCULAR | Status: DC | PRN

## 2023-07-27 MED ORDER — ASPIRIN 81 MG PO TBEC
81.0000 mg | DELAYED_RELEASE_TABLET | Freq: Every day | ORAL | 12 refills | Status: DC
  Filled 2023-07-27: qty 30, 30d supply, fill #0

## 2023-07-27 MED ORDER — PRAVASTATIN SODIUM 40 MG PO TABS
40.0000 mg | ORAL_TABLET | Freq: Every day | ORAL | Status: DC
  Administered 2023-07-27 – 2023-07-28 (×2): 40 mg via ORAL
  Filled 2023-07-27 (×2): qty 1

## 2023-07-27 MED ORDER — TRAMADOL HCL 50 MG PO TABS: 50.0000 mg | ORAL_TABLET | Freq: Four times a day (QID) | ORAL | Status: DC | PRN

## 2023-07-27 MED ORDER — TRAMADOL HCL 50 MG PO TABS
50.0000 mg | ORAL_TABLET | Freq: Four times a day (QID) | ORAL | 0 refills | Status: DC | PRN
  Filled 2023-07-27: qty 15, 4d supply, fill #0

## 2023-07-27 MED ORDER — LABETALOL HCL 5 MG/ML IV SOLN: 10.0000 mg | INTRAVENOUS | Status: DC | PRN

## 2023-07-27 NOTE — Anesthesia Postprocedure Evaluation (Signed)
 Anesthesia Post Note  Patient: Judy Carlson  Procedure(s) Performed: Selmer Dally ARTERY REVASCULARIZATION (TCAR) (Right)     Patient location during evaluation: PACU Anesthesia Type: General Level of consciousness: awake and alert Pain management: pain level controlled Vital Signs Assessment: post-procedure vital signs reviewed and stable Respiratory status: spontaneous breathing, nonlabored ventilation, respiratory function stable and patient connected to nasal cannula oxygen Cardiovascular status: blood pressure returned to baseline and stable Postop Assessment: no apparent nausea or vomiting Anesthetic complications: no   No notable events documented.  Last Vitals:  Vitals:   07/27/23 0500 07/27/23 0600  BP: 121/62 122/86  Pulse: 73 75  Resp: (!) 9 17  Temp:    SpO2: 94% 96%    Last Pain:  Vitals:   07/27/23 0315  TempSrc: Oral  PainSc:                  Joann Kulpa

## 2023-07-27 NOTE — Progress Notes (Addendum)
 Progress Note    07/27/2023 8:12 AM 1 Day Post-Op  Subjective:  no dizziness, L sided weakness, slurred speech, or vision changes   Vitals:   07/27/23 0500 07/27/23 0600  BP: 121/62 122/86  Pulse: 73 75  Resp: (!) 9 17  Temp:    SpO2: 94% 96%   Physical Exam: Lungs:  non labored Incisions:  R neck and L groin without hematoma Extremities:  moving all ext well Neurologic: CN grossly intact; symmetrical grip  CBC    Component Value Date/Time   WBC 8.7 07/27/2023 0610   RBC 3.87 07/27/2023 0610   HGB 10.5 (L) 07/27/2023 0610   HGB 13.3 09/02/2017 1537   HCT 32.1 (L) 07/27/2023 0610   HCT 40.2 09/02/2017 1537   PLT 310 07/27/2023 0610   PLT 385 09/02/2017 1537   MCV 82.9 07/27/2023 0610   MCV 85.3 10/28/2017 1605   MCV 84 09/02/2017 1537   MCV 84 06/19/2011 1233   MCH 27.1 07/27/2023 0610   MCHC 32.7 07/27/2023 0610   RDW 16.3 (H) 07/27/2023 0610   RDW 17.1 (H) 09/02/2017 1537   RDW 16.4 (H) 06/19/2011 1233   LYMPHSABS 1.4 07/25/2023 2053   LYMPHSABS 1.6 09/02/2017 1537   LYMPHSABS 2.8 06/19/2011 1233   MONOABS 0.7 07/25/2023 2053   MONOABS 0.9 06/19/2011 1233   EOSABS 0.1 07/25/2023 2053   EOSABS 0.1 09/02/2017 1537   EOSABS 0.2 06/19/2011 1233   BASOSABS 0.1 07/25/2023 2053   BASOSABS 0.1 09/02/2017 1537   BASOSABS 0.0 06/19/2011 1233    BMET    Component Value Date/Time   NA 140 07/27/2023 0610   NA 141 10/28/2017 1701   NA 142 06/19/2011 1233   K 4.3 07/27/2023 0610   K 4.3 06/19/2011 1233   CL 109 07/27/2023 0610   CL 107 06/19/2011 1233   CO2 19 (L) 07/27/2023 0610   CO2 27 06/19/2011 1233   GLUCOSE 127 (H) 07/27/2023 0610   GLUCOSE 95 06/19/2011 1233   GLUCOSE 95 12/31/2005 0719   BUN 19 07/27/2023 0610   BUN 39 (H) 10/28/2017 1701   BUN 17 06/19/2011 1233   CREATININE 1.30 (H) 07/27/2023 0610   CREATININE 0.84 10/26/2015 0820   CALCIUM  9.3 07/27/2023 0610   CALCIUM  9.7 06/19/2011 1233   GFRNONAA 43 (L) 07/27/2023 0610   GFRNONAA  >60 06/19/2011 1233   GFRAA >60 06/17/2019 1557   GFRAA >60 06/19/2011 1233    INR    Component Value Date/Time   INR 1.1 04/06/2023 0736     Intake/Output Summary (Last 24 hours) at 07/27/2023 1610 Last data filed at 07/26/2023 1900 Gross per 24 hour  Intake 2054.28 ml  Output 50 ml  Net 2004.28 ml     Assessment/Plan:  73 y.o. female is s/p R TCAR due to R carotid thrombus 1 Day Post-Op   No neurological events overnight R neck incision is well appearing without hematoma Continue aspirin  and plavix ; ok to d/c Xarelto ; she has a statin allergy OOB with therapy teams [facility] for discharge from vascular standpoint; office will call to arrange duplex in 1 month   Cordie Deters, PA-C [facility] [phone] 07/27/2023 8:12 AM  VASCULAR STAFF ADDENDUM: I have independently interviewed and examined the patient. I agree with the above.  No deficits this morning Doing well Dual antiplatelet therapy, statin allergy -she has not tried fluvastatin.   Will discuss this with her. Home today  Kayla Part MD [facility]  of [employer] Number: [phone] 07/27/2023 9:32 AM

## 2023-07-27 NOTE — Discharge Instructions (Signed)
   [facility]  Discharge Instructions   Carotid Endarterectomy (CEA)  Please refer to the following instructions for your post-procedure care. Your surgeon or physician assistant will discuss any changes with you.  Activity  You are encouraged to walk as much as you can. You can slowly return to normal activities but must avoid strenuous activity and heavy lifting until your doctor tell you it's OK. Avoid activities such as vacuuming or swinging a golf club. You can drive after one week if you are comfortable and you are no longer taking prescription pain medications. It is normal to feel tired for serval weeks after your surgery. It is also normal to have difficulty with sleep habits, eating, and bowel movements after surgery. These will go away with time.  Bathing/Showering  You may shower after you come home. Do not soak in a bathtub, hot tub, or swim until the incision heals completely.  Incision Care  Shower every day. Clean your incision with mild soap and water. Pat the area dry with a clean towel. You do not need a bandage unless otherwise instructed. Do not apply any ointments or creams to your incision. You may have skin glue on your incision. Do not peel it off. It will come off on its own in about one week. Your incision may feel thickened and raised for several weeks after your surgery. This is normal and the skin will soften over time. For Men Only: It's OK to shave around the incision but do not shave the incision itself for 2 weeks. It is common to have numbness under your chin that could last for several months.  Diet  Resume your normal diet. There are no special food restrictions following this procedure. A low fat/low cholesterol diet is recommended for all patients with vascular disease. In order to heal from your surgery, it is CRITICAL to get adequate nutrition. Your body requires vitamins, minerals, and protein. Vegetables are the best  source of vitamins and minerals. Vegetables also provide the perfect balance of protein. Processed food has little nutritional value, so try to avoid this.        Medications  Resume taking all of your medications unless your doctor or physician assistant tells you not to. If your incision is causing pain, you may take over-the- counter pain relievers such as acetaminophen (Tylenol). If you were prescribed a stronger pain medication, please be aware these medications can cause nausea and constipation. Prevent nausea by taking the medication with a snack or meal. Avoid constipation by drinking plenty of fluids and eating foods with a high amount of fiber, such as fruits, vegetables, and grains. Do not take Tylenol if you are taking prescription pain medications.  Follow Up  Our office will schedule a follow up appointment 2-3 weeks following discharge.  Please call us immediately for any of the following conditions  Increased pain, redness, drainage (pus) from your incision site. Fever of 101 degrees or higher. If you should develop stroke (slurred speech, difficulty swallowing, weakness on one side of your body, loss of vision) you should call 911 and go to the nearest emergency room.  Reduce your risk of vascular disease:  Stop smoking. If you would like help call [facility] at 1-800-QUIT-NOW ((910) 853-8047) or [facility] at [phone]. Manage your cholesterol Maintain a desired weight Control your diabetes Keep your blood pressure down  If you have any questions, please call the office at [phone].

## 2023-07-27 NOTE — Progress Notes (Signed)
 PHARMACIST LIPID MONITORING   Judy Carlson is a 73 y.o. female admitted on 07/25/2023 with carotid artery stenosis.  Pharmacy has been consulted to optimize lipid-lowering therapy with the indication of secondary prevention for clinical ASCVD.  Recent Labs:  Lipid Panel (last 6 months):   Lab Results  Component Value Date   CHOL 238 (H) 07/27/2023   TRIG 97 07/27/2023   HDL 58 07/27/2023   CHOLHDL 4.1 07/27/2023   VLDL 19 07/27/2023   LDLCALC 161 (H) 07/27/2023    Hepatic function panel (last 6 months):   Lab Results  Component Value Date   AST 33 07/10/2023   ALT 23 07/10/2023   ALKPHOS 54 07/10/2023   BILITOT 0.7 07/10/2023    SCr (since admission):   Serum creatinine: 1.3 mg/dL (H) 96/04/54 0981 Estimated creatinine clearance: 41.4 mL/min (A)  Current therapy and lipid therapy tolerance Current lipid-lowering therapy: ezetimibe  10 mg daily  Previous lipid-lowering therapies (if applicable): rosuvastatin , atorvastatin , pravastatin , lovastatin   Documented or reported allergies or intolerances to lipid-lowering therapies (if applicable):  Rosuvastatin , atorvastatin  - myalgias Pravastatin  - switched to rosuvastatin  Lovastatin  - vein issues, rash  Assessment:   LDL 161, which is not at goal of < 55, on ezetimibe  monotherapy due to history of myalgias/intolerances with multiple statins. Noted that pravastatin  seemed to be tolerated but was switched to rosuvastatin  for better LDL lowering. Spoke with patient who stated she cannot remember if she had myalgias with pravastatin , therefore she is willing to retry therapy. Provided education that pravastatin  won't get us  enough LDL lowering to get her to goal, so patient also agreeable to a referral to the lipid clinic for possible initiation of PCKS9i. Patient agrees with changes to lipid-lowering therapy  Plan:    1.Statin intensity (high intensity recommended for all patients regardless of the LDL):  Statin intolerance  noted. Discussed with patient, start pravastatin  40 mg daily as this was the dose previously tolerated.  2.Add ezetimibe  (if any one of the following): already taking  3.Refer to lipid clinic:   Yes  4.Follow-up with:  Lipid clinic referral.  5.Follow-up labs after discharge:  Changes in lipid therapy were made. Check a lipid panel in 8-12 weeks then annually.     Marybelle Smiling, PharmD 07/27/2023, 8:00 AM

## 2023-07-27 NOTE — Evaluation (Signed)
 Physical Therapy Evaluation and Discharge Patient Details Name: Judy Carlson MRN: [id number] DOB: [birth year] Today's Date: 07/27/2023  History of Present Illness  Pt is 73 yo female who presents on 07/25/23 from MD office due to R carotid blockage. Underwent R transcarotid artery revascularization and primary repair of the R common carotid. PMH: anemia, angioedema, R CEA 2020, GERD, HLD, HTN, Raynauds, R CVA, CKD3, COPD, PVD  Clinical Impression  Patient evaluated by Physical Therapy with no further acute PT needs identified. All education has been completed and the patient has no further questions. Pt received in bed, feeling well. Lives alone and is independent but has friends and sister that she can stay with for few days upon initial d/c, encouraged to do this. Pt mobilizing at mod I level. Was light headed with initial ambulation but subsided with time up. Because of this feeling pt asking about going home tomorrow instead of today to feel more stable. Will have mobility team continue to follow pt.  No follow-up Physical Therapy or equipment needs. PT is signing off. Thank you for this referral.         If plan is discharge home, recommend the following: Assist for transportation   Can travel by private vehicle        Equipment Recommendations None recommended by PT  Recommendations for Other Services       Functional Status Assessment Patient has not had a recent decline in their functional status     Precautions / Restrictions Precautions Precautions: None Restrictions Weight Bearing Restrictions Per Provider Order: No      Mobility  Bed Mobility Overal bed mobility: Independent                  Transfers Overall transfer level: Independent Equipment used: None                    Ambulation/Gait Ambulation/Gait assistance: Supervision, Modified independent (Device/Increase time) Gait Distance (Feet): 300 Feet Assistive device: IV Pole Gait  Pattern/deviations: Step-through pattern Gait velocity: WFL Gait velocity interpretation: >4.37 ft/sec, indicative of normal walking speed   General Gait Details: pt mildly light headed with initial ambulation, improved with time up. Supervision progressing to mod I. Pushed pole in hallway, no support in room. Normal mvmt patterns and balance reactions noted  Stairs            Wheelchair Mobility     Tilt Bed    Modified Rankin (Stroke Patients Only)       Balance Overall balance assessment: Modified Independent                                           Pertinent Vitals/Pain Pain Assessment Pain Assessment: Faces Faces Pain Scale: Hurts a little bit Pain Location: neck sore Pain Descriptors / Indicators: Sore Pain Intervention(s): Limited activity within patient's tolerance, Monitored during session    Home Living Family/patient expects to be discharged to:: Private residence Living Arrangements: Alone Available Help at Discharge: Neighbor;Available PRN/intermittently Type of Home: House Home Access: Stairs to enter Entrance Stairs-Rails: None (wall) Entrance Stairs-Number of Steps: 3   Home Layout: One level Home Equipment: Grab bars - tub/shower;Rolling Walker (2 wheels);BSC/3in1 Additional Comments: may go home with sister first couple of nights    Prior Function Prior Level of Function : Independent/Modified Independent;Driving  Mobility Comments: independent ADLs Comments: independent     Extremity/Trunk Assessment   Upper Extremity Assessment Upper Extremity Assessment: Overall WFL for tasks assessed    Lower Extremity Assessment Lower Extremity Assessment: Overall WFL for tasks assessed    Cervical / Trunk Assessment Cervical / Trunk Assessment: Normal  Communication   Communication Communication: No apparent difficulties    Cognition Arousal: Alert Behavior During Therapy: WFL for tasks  assessed/performed   PT - Cognitive impairments: No apparent impairments                         Following commands: Intact       Cueing Cueing Techniques: Verbal cues     General Comments General comments (skin integrity, edema, etc.): VSS on RA. Recommend pt have someone stay night with her first 2 nights for safety. Pt can stay with sister    Exercises Other Exercises Other Exercises: discussed activity level for return home Other Exercises: discussed daily exercise routine, TV based   Assessment/Plan    PT Assessment Patient does not need any further PT services  PT Problem List         PT Treatment Interventions      PT Goals (Current goals can be found in the Care Plan section)  Acute Rehab PT Goals Patient Stated Goal: return home PT Goal Formulation: All assessment and education complete, DC therapy    Frequency       Co-evaluation               AM-PAC PT "6 Clicks" Mobility  Outcome Measure Help needed turning from your back to your side while in a flat bed without using bedrails?: None Help needed moving from lying on your back to sitting on the side of a flat bed without using bedrails?: None Help needed moving to and from a bed to a chair (including a wheelchair)?: None Help needed standing up from a chair using your arms (e.g., wheelchair or bedside chair)?: None Help needed to walk in hospital room?: None Help needed climbing 3-5 steps with a railing? : None 6 Click Score: 24    End of Session Equipment Utilized During Treatment: Gait belt Activity Tolerance: Patient tolerated treatment well Patient left: in chair;with call bell/phone within reach Nurse Communication: Mobility status PT Visit Diagnosis: Difficulty in walking, not elsewhere classified (R26.2)    Time: 1240-1312 PT Time Calculation (min) (ACUTE ONLY): 32 min   Charges:   PT Evaluation $PT Eval Moderate Complexity: 1 Mod PT Treatments $Gait Training: 8-22  mins PT General Charges $$ ACUTE PT VISIT: 1 Visit         Amey Ka, PT  Acute Rehab Services Secure chat preferred Office [phone]   Deloris Fetters Deniece Rankin 07/27/2023, 1:38 PM

## 2023-07-27 NOTE — Progress Notes (Signed)
 OT Screen and Discharge  Patient Details Name: Judy Carlson MRN: [id number] DOB: Aug 04, 1950   Cancelled Treatment:    Reason Eval/Treat Not Completed: OT screened, no needs identified, will sign off (OT screen complete with OT discussing pt current functional level with pt, PT, and RN. No OT needs identified and no equipment needs identified. OT is signing off at this time.)  [name] "[name]" M., OTR/L, MA Acute Rehab [phone]   Walt Gunner 07/27/2023, 1:11 PM

## 2023-07-27 NOTE — Progress Notes (Signed)
 [facility] PROGRESS NOTE    Progress Note  ALEASE FAIT  NGE:[id number] DOB: 1950-08-05 DOA: 07/25/2023 PCP: Genia Kettering, MD     Brief Narrative:   Judy Carlson is an 73 y.o. 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 persistent severe stenosis, vascular surgery was consulted and recommended to start IV heparin  and n.p.o., she status post right transcarotid artery revascularization and repair of the right common artery   Assessment/Plan:   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 She status post right carotid artery revascularization and repair of the right common carotid artery. Further management per vascular surgery. Continue Plavix . She has not gotten out of bed in 2 days. Out of bed to chair consult physical therapy and Occupational Therapy.  Essential  HYPERTENSION Continue metoprolol .  Use hydralazine  IV as needed and labetalol  as needed. Blood pressure significantly elevated start on low-dose hydralazine .   Dyslipidemia Continue Zetia .  History of CVA: Continue Setia and Plavix .  Chronic kidney disease stage IIIa: Her creatinine appears to be at baseline.  Prediabetes mellitus type II: With an A1c less than 6.5 continue diet control.  COPD: Noted no exacerbation.  History of PVD: Continue PPI.   DVT prophylaxis: scd Family Communication:none Status is: Inpatient Remains inpatient appropriate because: Status post  surgical intervention    Code Status:     Code Status Orders  (From admission, onward)           Start     Ordered   07/26/23 0633  Do not attempt resuscitation (DNR) Pre-Arrest Interventions Desired  Continuous       Question Answer Comment  If pulseless and not breathing No CPR or chest compressions.   In Pre-Arrest Conditions (Patient Has Pulse and Is Breathing) May intubate, use advanced airway interventions and cardioversion/ACLS medications if appropriate or indicated. May transfer to ICU.   Consent: Discussion documented in EHR or advanced directives reviewed      07/26/23 0633           Code Status History     Date Active Date Inactive Code Status Order ID Comments User Context   04/05/2023 2231 04/08/2023 1712 Limited: Do not attempt resuscitation (DNR) -DNR-LIMITED -Do Not Intubate/DNI  [id number]  Arnulfo Larch, MD ED   04/05/2023 2150 04/05/2023 2231 Full Code [id number]  Arnulfo Larch, MD ED   07/04/2020 1622 07/07/2020 2216 DNR [id number]  Lorita Rosa, MD ED   01/12/2019 1801 01/13/2019 1445 Full Code [id number]  Butch Cashing, PA-C Inpatient   12/23/2018 1724 12/25/2018 1340 Full Code [id number]  Abron Abt, DO ED   03/03/2016 0122 03/04/2016 1829 DNR [id number]  Selene Dais, MD Inpatient   05/14/2013 2100 05/15/2013 1625 DNR [id number]  Keith Pat, MD Inpatient         IV Access:   Peripheral IV   Procedures and diagnostic studies:   DG C-Arm 1-60 Min Result Date: 07/26/2023 CLINICAL DATA:  Trans carotid  artery revascularization EXAM: DG C-ARM 1-60 MIN FLUOROSCOPY: Fluoroscopy Time:  5 minutes 26.7 seconds Radiation Exposure Index (if provided by the fluoroscopic device): 42.599 mGy COMPARISON:  None. FINDINGS: Fluoroscopic series are obtained during a carotid revascularization procedure with stent placement. IMPRESSION: Fluoroscopic series are obtained during a carotid revascularization procedure with stent placement. See  procedural note for details. Electronically Signed   By: Rozell Cornet M.D.   On: 07/26/2023 20:26   CT ANGIO HEAD NECK W WO CM Result Date: 07/25/2023 CLINICAL DATA:  Known right carotid artery thrombus, now with sensation of neck swelling and ear fullness. EXAM: CT ANGIOGRAPHY HEAD AND NECK WITH AND WITHOUT CONTRAST TECHNIQUE: Multidetector CT imaging of the head and neck was performed using the standard protocol during bolus administration of intravenous contrast. Multiplanar CT image reconstructions and MIPs were obtained to evaluate the vascular anatomy. Carotid stenosis measurements (when applicable) are obtained utilizing NASCET criteria, using the distal internal carotid diameter as the denominator. RADIATION DOSE REDUCTION: This exam was performed according to the departmental dose-optimization program which includes automated exposure control, adjustment of the mA and/or kV according to patient size and/or use of iterative reconstruction technique. CONTRAST:  75mL OMNIPAQUE  IOHEXOL  350 MG/ML SOLN COMPARISON:  CTA head and neck 07/10/2023 FINDINGS: CT HEAD FINDINGS Brain: There is no evidence of an acute infarct, intracranial hemorrhage, mass, midline shift, or extra-axial fluid collection. Cerebral volume is normal. The ventricles are normal in size. Vascular: Calcified atherosclerosis at the skull base. No hyperdense vessel. Skull: No fracture or suspicious lesion. Sinuses/Orbits: Paranasal sinuses and mastoid air cells are clear. Right cataract extraction. Other: None. Review of the MIP images confirms the above findings CTA NECK FINDINGS Aortic arch: Standard branching with moderate atherosclerosis. Persistent mild-to-moderate stenosis of the origin of the left subclavian artery due to calcified plaque with detailed assessment limited by motion artifact. Right carotid system: Prior endarterectomy. Prominent thrombus in the distal common carotid artery and proximal ICA demonstrates mild interval  distal propagation compared to the prior CTA and again results in approximately 75% stenosis of the proximal ICA. The more distal cervical ICA remains widely patent. Left carotid system: Patent with a moderate amount of soft and calcified plaque in the distal common carotid and proximal internal carotid arteries without evidence of a significant stenosis. Vertebral arteries: Patent with the left being dominant. Unchanged severe right and mild-to-moderate left vertebral artery origin stenoses. Skeleton: Widespread advanced disc degeneration in the cervical spine as described on this months earlier dedicated spine CT. Other neck: No evidence of cervical lymphadenopathy or mass. Upper chest: No mass or consolidation in the included lung apices. Review of the MIP images confirms the above findings CTA HEAD FINDINGS Anterior circulation: The internal carotid arteries are patent from skull base to carotid termini with atherosclerosis resulting in moderate siphon stenoses bilaterally, similar to the prior CTA. ACAs and MCAs are patent without evidence of a proximal branch occlusion or significant proximal stenosis. No aneurysm is identified. Posterior circulation: The intracranial vertebral arteries are patent to the basilar with mild atherosclerotic irregularity but no significant stenosis. Patent left PICA, bilateral AICA, and bilateral SCA origins are visualized. The basilar artery is widely patent. There are small left and diminutive or absent right posterior communicating arteries. The PCAs are patent with an unchanged moderate mid left P2 stenosis. No aneurysm is identified. Venous sinuses: As permitted by contrast timing, patent. Anatomic variants: None. Review of the MIP images confirms the above findings IMPRESSION: 1. No evidence of acute intracranial  abnormality. 2. Mild interval distal propagation of thrombus in the right carotid bulb region with persistent severe stenosis of the proximal ICA of 75% or greater.  3. Unchanged moderate bilateral ICA siphon stenoses and left P2 stenosis. 4. Unchanged severe right and mild-to-moderate left vertebral artery origin stenoses. 5.  Aortic Atherosclerosis (ICD10-I70.0). Electronically Signed   By: Aundra Lee M.D.   On: 07/25/2023 18:20     Medical Consultants:   None.   Subjective:    Daryll Epp she relates her pain is controlled does not want narcotics for pain.  Objective:    Vitals:   07/27/23 0100 07/27/23 0200 07/27/23 0307 07/27/23 0315  BP: 134/60 127/60 137/62   Pulse: 73 76 77 69  Resp: 13 16 18 14   Temp:    97.8 F (36.6 C)  TempSrc:    Oral  SpO2: 95% 95% 94% 97%  Weight:      Height:       SpO2: 97 %   Intake/Output Summary (Last 24 hours) at 07/27/2023 0653 Last data filed at 07/26/2023 1900 Gross per 24 hour  Intake 2054.28 ml  Output 50 ml  Net 2004.28 ml   Filed Weights   07/25/23 1155 07/26/23 1300  Weight: 84.4 kg 84.4 kg    Exam: General exam: In no acute distress. Respiratory system: Good air movement and clear to auscultation. Cardiovascular system: S1 & S2 heard, RRR. No JVD. Gastrointestinal system: Abdomen is nondistended, soft and nontender.  Extremities: No pedal edema. Skin: Surgical site appears to be clean no signs of infection Psychiatry: Judgement and insight appear normal. Mood & affect appropriate.    Data Reviewed:    Labs: Basic Metabolic Panel: Recent Labs  Lab 07/25/23 1310 07/25/23 2053  NA 142 139  K 3.7 3.6  CL 108 107  CO2  --  22  GLUCOSE 100* 124*  BUN 24* 19  CREATININE 1.40* 1.18*  CALCIUM   --  9.4   GFR Estimated Creatinine Clearance: 45.6 mL/min (A) (by C-G formula based on SCr of 1.18 mg/dL (H)). Liver Function Tests: No results for input(s): "AST", "ALT", "ALKPHOS", "BILITOT", "PROT", "ALBUMIN " in the last 168 hours. No results for input(s): "LIPASE", "AMYLASE" in the last 168 hours. No results for input(s): "AMMONIA" in the last 168 hours. Coagulation  profile No results for input(s): "INR", "PROTIME" in the last 168 hours. COVID-19 Labs  No results for input(s): "DDIMER", "FERRITIN", "LDH", "CRP" in the last 72 hours.  Lab Results  Component Value Date   SARSCOV2NAA NEGATIVE 07/04/2020   SARSCOV2NAA POSITIVE (A) 12/23/2018   SARSCOV2NAA Detected (A) 11/06/2018    CBC: Recent Labs  Lab 07/22/23 1136 07/25/23 1310 07/25/23 2053 07/26/23 0430 07/27/23 0610  WBC 5.1  --  4.7 5.1 8.7  NEUTROABS 3.0  --  2.5  --   --   HGB 12.8 11.9* 12.1 11.5* 10.5*  HCT 39.6 35.0* 36.9 36.1 32.1*  MCV 84.1  --  83.9 84.3 82.9  PLT 364.0  --  357 351 310   Cardiac Enzymes: No results for input(s): "CKTOTAL", "CKMB", "CKMBINDEX", "TROPONINI" in the last 168 hours. BNP (last 3 results) No results for input(s): "PROBNP" in the last 8760 hours. CBG: No results for input(s): "GLUCAP" in the last 168 hours. D-Dimer: No results for input(s): "DDIMER" in the last 72 hours. Hgb A1c: No results for input(s): "HGBA1C" in the last 72 hours. Lipid Profile: No results for input(s): "CHOL", "HDL", "LDLCALC", "TRIG", "CHOLHDL", "LDLDIRECT" in the last  72 hours. Thyroid  function studies: No results for input(s): "TSH", "T4TOTAL", "T3FREE", "THYROIDAB" in the last 72 hours.  Invalid input(s): "FREET3" Anemia work up: No results for input(s): "VITAMINB12", "FOLATE", "FERRITIN", "TIBC", "IRON ", "RETICCTPCT" in the last 72 hours. Sepsis Labs: Recent Labs  Lab 07/22/23 1136 07/25/23 2053 07/26/23 0430 07/27/23 0610  WBC 5.1 4.7 5.1 8.7   Microbiology Recent Results (from the past 240 hours)  Surgical pcr screen     Status: None   Collection Time: 07/26/23  9:52 AM   Specimen: Nasal Mucosa; Nasal Swab  Result Value Ref Range Status   MRSA, PCR NEGATIVE NEGATIVE Final   Staphylococcus aureus NEGATIVE NEGATIVE Final    Comment: (NOTE) The Xpert SA Assay (FDA approved for NASAL specimens in patients 20 years of age and older), is one component of  a comprehensive surveillance program. It is not intended to diagnose infection nor to guide or monitor treatment. Performed at [facility], [address] [place], [place] [address]      Medications:    aspirin  EC  81 mg Oral Q0600   clopidogrel   75 mg Oral Daily   docusate sodium   100 mg Oral Daily   ezetimibe   10 mg Oral Daily   metoprolol  succinate  100 mg Oral Daily   pantoprazole   40 mg Oral Daily   Continuous Infusions:  sodium chloride  75 mL/hr at 07/26/23 1808   sodium chloride      sodium chloride      magnesium  sulfate bolus IVPB        LOS: 1 day   Macdonald Savoy  [facility]  07/27/2023, 6:53 AM

## 2023-07-28 DIAGNOSIS — I6521 Occlusion and stenosis of right carotid artery: Secondary | ICD-10-CM | POA: Diagnosis not present

## 2023-07-28 LAB — CBC
HCT: 33.1 % — ABNORMAL LOW (ref 36.0–46.0)
Hemoglobin: 10.7 g/dL — ABNORMAL LOW (ref 12.0–15.0)
MCH: 27.4 pg (ref 26.0–34.0)
MCHC: 32.3 g/dL (ref 30.0–36.0)
MCV: 84.7 fL (ref 80.0–100.0)
Platelets: 380 10*3/uL (ref 150–400)
RBC: 3.91 MIL/uL (ref 3.87–5.11)
RDW: 16.6 % — ABNORMAL HIGH (ref 11.5–15.5)
WBC: 6.4 10*3/uL (ref 4.0–10.5)
nRBC: 0 % (ref 0.0–0.2)

## 2023-07-28 LAB — APTT: aPTT: 32 s (ref 24–36)

## 2023-07-28 LAB — BASIC METABOLIC PANEL WITH GFR
Anion gap: 10 (ref 5–15)
BUN: 21 mg/dL (ref 8–23)
CO2: 21 mmol/L — ABNORMAL LOW (ref 22–32)
Calcium: 9.3 mg/dL (ref 8.9–10.3)
Chloride: 109 mmol/L (ref 98–111)
Creatinine, Ser: 1.33 mg/dL — ABNORMAL HIGH (ref 0.44–1.00)
GFR, Estimated: 42 mL/min — ABNORMAL LOW (ref >60)
Glucose, Bld: 107 mg/dL — ABNORMAL HIGH (ref 70–99)
Potassium: 3.8 mmol/L (ref 3.5–5.1)
Sodium: 140 mmol/L (ref 135–145)

## 2023-07-28 MED ORDER — CLOPIDOGREL BISULFATE 75 MG PO TABS: 75.0000 mg | ORAL_TABLET | Freq: Every day | ORAL | 3 refills | Status: DC

## 2023-07-28 MED ORDER — PRAVASTATIN SODIUM 40 MG PO TABS: 40.0000 mg | ORAL_TABLET | Freq: Every day | ORAL | 3 refills | Status: DC

## 2023-07-28 MED ORDER — ASPIRIN 81 MG PO TBEC: 81.0000 mg | DELAYED_RELEASE_TABLET | Freq: Every day | ORAL | 12 refills | Status: DC

## 2023-07-28 NOTE — Progress Notes (Addendum)
  Progress Note    07/28/2023 7:56 AM 2 Days Post-Op  Subjective:  no events overnight   Vitals:   07/27/23 2330 07/28/23 0340  BP: 128/74 128/68  Pulse: 70 72  Resp: 20 20  Temp: 98.3 F (36.8 C) 98.5 F (36.9 C)  SpO2: 97% 94%   Physical Exam: Lungs:  non labored Incisions:  R neck without hematoma Extremities:  moving all ext well Neurologic: CN grossly intact  CBC    Component Value Date/Time   WBC 8.7 07/27/2023 0610   RBC 3.87 07/27/2023 0610   HGB 10.5 (L) 07/27/2023 0610   HGB 13.3 09/02/2017 1537   HCT 32.1 (L) 07/27/2023 0610   HCT 40.2 09/02/2017 1537   PLT 310 07/27/2023 0610   PLT 385 09/02/2017 1537   MCV 82.9 07/27/2023 0610   MCV 85.3 10/28/2017 1605   MCV 84 09/02/2017 1537   MCV 84 06/19/2011 1233   MCH 27.1 07/27/2023 0610   MCHC 32.7 07/27/2023 0610   RDW 16.3 (H) 07/27/2023 0610   RDW 17.1 (H) 09/02/2017 1537   RDW 16.4 (H) 06/19/2011 1233   LYMPHSABS 1.4 07/25/2023 2053   LYMPHSABS 1.6 09/02/2017 1537   LYMPHSABS 2.8 06/19/2011 1233   MONOABS 0.7 07/25/2023 2053   MONOABS 0.9 06/19/2011 1233   EOSABS 0.1 07/25/2023 2053   EOSABS 0.1 09/02/2017 1537   EOSABS 0.2 06/19/2011 1233   BASOSABS 0.1 07/25/2023 2053   BASOSABS 0.1 09/02/2017 1537   BASOSABS 0.0 06/19/2011 1233    BMET    Component Value Date/Time   NA 140 07/27/2023 0610   NA 141 10/28/2017 1701   NA 142 06/19/2011 1233   K 4.3 07/27/2023 0610   K 4.3 06/19/2011 1233   CL 109 07/27/2023 0610   CL 107 06/19/2011 1233   CO2 19 (L) 07/27/2023 0610   CO2 27 06/19/2011 1233   GLUCOSE 127 (H) 07/27/2023 0610   GLUCOSE 95 06/19/2011 1233   GLUCOSE 95 12/31/2005 0719   BUN 19 07/27/2023 0610   BUN 39 (H) 10/28/2017 1701   BUN 17 06/19/2011 1233   CREATININE 1.30 (H) 07/27/2023 0610   CREATININE 0.84 10/26/2015 0820   CALCIUM  9.3 07/27/2023 0610   CALCIUM  9.7 06/19/2011 1233   GFRNONAA 43 (L) 07/27/2023 0610   GFRNONAA >60 06/19/2011 1233   GFRAA >60 06/17/2019  1557   GFRAA >60 06/19/2011 1233    INR    Component Value Date/Time   INR 1.1 04/06/2023 0736     Intake/Output Summary (Last 24 hours) at 07/28/2023 0756 Last data filed at 07/27/2023 1731 Gross per 24 hour  Intake 720 ml  Output --  Net 720 ml     Assessment/Plan:  73 y.o. female is s/p R TCAR 2 Days Post-Op   No neuro events overnight R neck incision healing well Continue aspirin  and plavix  and statin for at least month Ok for discharge from vascular standpoint; office will arrange duplex in 1 month   Cordie Deters, PA-C [facility] [phone] 07/28/2023 7:56 AM  VASCULAR STAFF ADDENDUM: I have independently interviewed and examined the patient. I agree with the above.  Home today, asked to continue all meds until seen in the office   Kayla Part MD [facility] Phone Number: [phone] 07/28/2023 8:26 AM

## 2023-07-28 NOTE — Progress Notes (Signed)
 DISCHARGE NOTE HOME [address] to be discharged Home per MD order. Discussed prescriptions and follow up appointments with the patient. Prescriptions given to patient; medication list explained in detail. Patient verbalized understanding.  Skin clean, dry and intact without evidence of skin break down, no evidence of skin tears noted. IV catheter discontinued intact. Site without signs and symptoms of complications. Dressing and pressure applied. Pt denies pain at the site currently. No complaints noted.  See LDA for incisions at discharge  An After Visit Summary (AVS) was printed and given to the patient. Patient escorted via wheelchair, and discharged home via private auto.  Tonda Francisco, RN
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
## 2023-07-28 NOTE — Plan of Care (Signed)
  Problem: Clinical Measurements: Goal: Diagnostic test results will improve Outcome: Progressing   Problem: Safety: Goal: Ability to remain free from injury will improve Outcome: Progressing   Problem: Clinical Measurements: Goal: Postoperative complications will be avoided or minimized Outcome: Progressing   Problem: Skin Integrity: Goal: Demonstration of wound healing without infection will improve Outcome: Progressing   Problem: Ischemic Stroke/TIA Tissue Perfusion: Goal: Complications of ischemic stroke/TIA will be minimized Outcome: Progressing   Problem: Education: Goal: Knowledge of patient specific risk factors will improve (DELETE if not current risk factor) Outcome: Progressing

## 2023-07-30 ENCOUNTER — Encounter (HOSPITAL_COMMUNITY): Payer: Self-pay | Admitting: Vascular Surgery

## 2023-07-31 ENCOUNTER — Encounter (HOSPITAL_COMMUNITY): Payer: Self-pay | Admitting: Vascular Surgery

## 2023-07-31 ENCOUNTER — Encounter: Payer: Self-pay | Admitting: Internal Medicine

## 2023-07-31 ENCOUNTER — Telehealth: Payer: Self-pay | Admitting: *Deleted

## 2023-07-31 NOTE — Progress Notes (Signed)
 Care Guide Pharmacy Note  07/31/2023 Name: KAMELIA LAMPKINS MRN: [id number] DOB: 09-07-1950  Referred By: Genia Kettering, MD Reason for referral: Complex Care Management and Call Attempt #1 (Outreach to schedule referral with pharmacist )   TINIA ORAVEC is a 73 y.o. year old female who is a primary care patient of Plotnikov, Oakley Bellman, MD.  Daryll Epp was referred to the pharmacist for assistance related to: Xarelto   Pt says upon hospital d/c Xarelto  was d/c'd - pt declined referral at this time   Kandis Ormond, CMA [facility]  [facility], [facility] Guide Direct Dial: [phone]  Fax: [phone] Website: [email or web address]

## 2023-07-31 NOTE — Transitions of Care (Post Inpatient/ED Visit) (Signed)
 07/31/2023  Name: Judy Carlson MRN: [id number] DOB: Oct 09, 1950  Today's TOC FU Call Status: Today's TOC FU Call Status:: Successful TOC FU Call Completed TOC FU Call Complete Date: 07/31/23 (TOC referral received 07/30/23) Patient's Name and Date of Birth confirmed.  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 Thanks for getting me this appointment with Dr. [name] Kipper") Any questions or concerns?: Yes Patient Questions/Concerns:: Intermittent self-limiting, "fleeting" sensations of lightheadedness; "goes away as soon as I am aware of it;" Patient Questions/Concerns Addressed: Other:, Notified Provider of Patient Questions/Concerns (Provided education around action plan for lightheadedness: sit/ lie down immediately; encouraged patient to begin monitoring/ recording blood pressures at home, both when she has this sensation/ when she doesn't- and to take readings to PCP office visit)  Items Reviewed: Did you receive and understand the discharge instructions provided?: Yes (thoroughly reviewed with patient who verbalizes very good understanding of same) Medications obtained,verified, and reconciled?: Yes (Medications Reviewed) (Full medication reconciliation/ review completed; no concerns or discrepancies identified; confirmed patient obtained/ is taking all newly Rx'd medications as instructed; self-manages medications and denies questions/ concerns around medications today) Any new allergies  since your discharge?: No Dietary orders reviewed?: Yes Type of Diet Ordered:: Heart healthy, low sodium diet Do you have support at home?: Yes People in Home [RPT]: alone Name of Support/Comfort Primary Source: Reports independent in self-care activities; resides alone; has local sister/ niece- assists as/ if needed/ indicated  Medications Reviewed Today: Medications Reviewed Today     Reviewed by Elisheva Fallas M, RN (Registered Nurse) on 07/31/23 at 1426  Med List Status: <None>   Medication Order Taking? Sig Documenting Provider Last Dose Status Informant  acetaminophen  (TYLENOL ) 500 MG tablet [id number] Yes Take 1,000 mg by mouth as needed for mild pain (pain score 1-3), headache or fever. [provider] Taking Active Self, Pharmacy Records  aspirin  EC 81 MG tablet [id number] Yes Take 1 tablet (81 mg total) by mouth daily at 6 (six) AM. Swallow whole. Macdonald Savoy, MD Taking Active   Cholecalciferol  (VITAMIN D3) 50 MCG (2000 UT) capsule [id number] Yes TAKE 1 CAPSULE (2,000 UNITS TOTAL) BY MOUTH DAILY.  Patient taking differently: Take 2,000 Units by mouth daily.   Plotnikov, Aleksei V, MD Taking Active Self, Pharmacy Records  clopidogrel  (PLAVIX ) 75 MG tablet [id number] Yes Take 1 tablet (75 mg total) by mouth daily. Macdonald Savoy, MD Taking Active   ezetimibe  (ZETIA ) 10 MG tablet [id number] Yes TAKE 1 TABLET EVERY DAY Plotnikov, Aleksei V, MD Taking Active Self, Pharmacy Records  furosemide  (LASIX ) 20 MG tablet [id number] Yes Take 20 mg by mouth daily. [provider] Taking Active Self, Pharmacy Records  metoprolol  succinate (TOPROL -XL) 100 MG 24 hr tablet [id number] Yes TAKE 1 TABLET DAILY WITH OR IMMEDIATELY FOLLOWING A MEAL. Plotnikov, Aleksei V, MD Taking Active Self, Pharmacy Records  Multiple Vitamin (MULTIVITAMIN) tablet [id number] Yes Take 1 tablet by mouth daily. [provider] Taking Active Self, Pharmacy Records  Olopatadine  HCl (PATADAY  OP)  [id number] Yes Place 1 drop into both eyes daily as needed (For  dry eyes). [provider] Taking Active Self, Pharmacy Records           Med Note Silvestre Drum, Myrtle Atta   Thu Jul 25, 2023  8:23 PM)    pantoprazole  (PROTONIX ) 40 MG tablet [id number] Yes Take 1 tablet (40 mg total) by mouth daily. Zehr, Jessica D, PA-C Taking Active Self, Pharmacy Records  pravastatin  (PRAVACHOL ) 40 MG tablet [id number] Yes Take 1 tablet (40 mg total) by mouth daily. Cordie Deters, PA-C Taking Active   traMADol (ULTRAM) 50 MG tablet [id number] Yes Take 1 tablet (50 mg total) by mouth every 6 (six) hours as needed for severe pain (pain score 7-10). Cordie Deters, PA-C Taking Active   valsartan  (DIOVAN ) 80 MG tablet [id number] Yes Take 1 tablet (80 mg total) by mouth daily. Plotnikov, Oakley Bellman, MD Taking Active Self, Pharmacy Records           Home Care and Equipment/Supplies: Were Home Health Services Ordered?: No Any new equipment or medical supplies ordered?: No  Functional Questionnaire: Do you need assistance with bathing/showering or dressing?: No Do you need assistance with meal preparation?: No Do you need assistance with eating?: No Do you have difficulty maintaining continence: No Do you need assistance with getting out of bed/getting out of a chair/moving?: No Do you have difficulty managing or taking your medications?: No  Follow up appointments reviewed: PCP Follow-up appointment confirmed?: Yes Date of PCP follow-up appointment?: 08/06/23 (care coordination outreach in real-time with scheduling care guide to successfully schedule hospital follow up PCP appointment 08/06/23) Follow-up Provider: PCP- [facility] Follow-up appointment confirmed?: Yes Date of Specialist follow-up appointment?: 09/12/23 (verified this is recommended time frame for follow up per hospital discharging provider notes) Follow-Up Specialty Provider:: vascular surgeon Do you need  transportation to your follow-up appointment?: No Do you understand care options if your condition(s) worsen?: Yes-patient verbalized understanding  SDOH Interventions Today    Flowsheet Row Most Recent Value  SDOH Interventions   Food Insecurity Interventions Intervention Not Indicated  Housing Interventions Intervention Not Indicated  Transportation Interventions Intervention Not Indicated  [drives self]  Utilities Interventions Intervention Not Indicated      Successfully enrolled into 30-day TOC program- provided my direct contact information should questions/ concerns/ needs arise post-TOC call, prior to next RN CM telephone visit    See TOC assessment tabs for additional assessment/ TOC intervention information  Pls call/ message for questions,  Zyron Deeley Mckinney Kito Cuffe, RN, BSN, Media planner  Transitions of Care  [facility] - [facility] [phone]: direct office

## 2023-07-31 NOTE — Patient Instructions (Signed)
 Visit Information  Thank you for taking time to visit with me today. Please don't hesitate to contact me if I can be of assistance to you before our next scheduled telephone appointment.  Our next appointment is by telephone on Thursday, August 08, 2023 at 9:45 am  Please call the care guide team at [phone] if you need to cancel or reschedule your appointment.     Patient Self Care Activities:  Attend all scheduled provider appointments Call provider office for new concerns or questions  Participate in Transition of Care Program/Attend TOC scheduled calls Take medications as prescribed   Continue pacing activity as your recuperation from recent surgery continues- if you begin to feel lightheaded, please sit or lie down right away Please start monitoring and recording your blood pressures at home- take all recorded blood pressures from home monitoring to upcoming PCP provider office visit on 08/06/23 for your doctor to review Please go over all of your medications and medication question/ concerns with your care providers (doctor) If you believe your condition is getting worse- contact your care providers (doctors) promptly- reaching out to your doctor early when you have concerns can prevent you from having to go to the hospital  Following is a copy of your care plan:   Goals Addressed             This Visit's Progress    [facility] Transitions of Care (TOC) Care Plan   On track    Problems:  Recent Hospitalization for treatment of carotid thrombosis Limited social support: resides alone; has local sister/ niece that assists as needed 1 unplanned hospital admission x last 6 months May 22-25, 2025:  Thrombosis of (R) carotid artery with surgical revascularization/ repair (Dr. Rosalva Comber)  Goal:  Over the next 30 days, the patient will not experience hospital readmission  Interventions:  Transitions of Care: week # 1/ day # 1 Durable Medical Equipment (DME) needs assessed with  patient/caregiver Doctor Visits  - discussed the importance of doctor visits Communication with PCP re: enrollment in [facility] 30-day program Arranged PCP follow-up within 7 days (Care Guide Scheduled) Post discharge activity limitations prescribed by provider reviewed Post-op wound/incision care reviewed with patient/caregiver Reviewed Signs and symptoms of infection Confirmed not currently requiring/ using assistive devices for ambulation  Provided education/ reinforcement around fall prevention, given patient's reports today of self-limiting "fleeting" episodes of lightheadedness post- recent hospital discharge Provided education/ reinforcement around benefit of conservative post-op activity; need to pace activity without over-doing  Purpose of BP medication, need to start home BP monitoring/ recording and take to provider office visits; signs/ symptoms hypotension with corresponding action plan  Safe driving/ activity restrictions while taking narcotic pain medication Successfully enrolled into 30-day TOC program- provided my direct contact information should questions/ concerns/ needs arise post-TOC call, prior to next RN CM telephone visit    Surgery ((R) carotid revascularization/ repair secondary to thrombosis): Evaluation of current treatment plan related to VVS- carotid surgery assessed patient/caregiver understanding of surgical procedure   reviewed post-operative instructions with patient/caregiver addressed questions about post - surgical incision care  reviewed medications with patient and addressed questions reviewed scheduled provider appointments with patient  08/06/23- PCP for hospital follow up; 09/12/23- vascular surgeon confirmed availability of transportation to all appointments patient reports surgeon told her at time of discharge to not drive "for 2 days" and the "said it was fine to resume driving myself;" unable to verify this from review of discharge instructions: advised  patient to not drive  if she is continuing to take narcotic pain medicine Pain assessed: reports "pain under good control, not having to use pain medicine hardly at all"  Patient Self Care Activities:  Attend all scheduled provider appointments Call provider office for new concerns or questions  Participate in Transition of Care Program/Attend TOC scheduled calls Take medications as prescribed   Continue pacing activity as your recuperation from recent surgery continues- if you begin to feel lightheaded, please sit or lie down right away Please start monitoring and recording your blood pressures at home- take all recorded blood pressures from home monitoring to upcoming PCP provider office visit on 08/06/23 for your doctor to review Please go over all of your medications and medication question/ concerns with your care providers (doctor) If you believe your condition is getting worse- contact your care providers (doctors) promptly- reaching out to your doctor early when you have concerns can prevent you from having to go to the hospital  Plan:  Telephone follow up appointment with care management team member scheduled for:  Thursday 08/08/23  Plan for next week's call: Review provider office visit PCP 08/08/23/ labs/ medication changes from 07/31/23 Assess intermittent lightheadedness as reported 07/31/23- review home Blood pressure readings Review medication adherence Review upcoming provider office visits- surgical provider 09/12/23 Assess surgical incision/ care         Patient verbalizes understanding of instructions and care plan provided today and agrees to view in MyChart. Active MyChart status and patient understanding of how to access instructions and care plan via MyChart confirmed with patient.     If you are experiencing a Mental Health or Behavioral Health Crisis or need someone to talk to, please  call the Suicide and Crisis Lifeline: 988 call the [place]  National Suicide  Prevention Lifeline: 310-330-3535 or TTY: 308-034-3800 TTY 3328742414) to talk to a trained counselor call 1-800-273-TALK (toll free, 24 hour hotline) go to [facility] [address] [place] [phone]) call the [facility] Crisis Line: 7697412951 call 911   Pls call/ message for questions,  Judy Haskins Mckinney Kylynn Street, RN, BSN, CCRN Alumnus RN Care Manager  Transitions of Care  [facility] - [facility] [phone]: direct office

## 2023-08-06 ENCOUNTER — Ambulatory Visit: Admitting: Internal Medicine

## 2023-08-06 ENCOUNTER — Encounter: Payer: Self-pay | Admitting: Internal Medicine

## 2023-08-06 VITALS — BP 130/80 | HR 83 | Temp 98.3°F | Ht 65.0 in | Wt 184.0 lb

## 2023-08-06 DIAGNOSIS — N1831 Chronic kidney disease, stage 3a: Secondary | ICD-10-CM | POA: Diagnosis not present

## 2023-08-06 DIAGNOSIS — I10 Essential (primary) hypertension: Secondary | ICD-10-CM | POA: Diagnosis not present

## 2023-08-06 DIAGNOSIS — E559 Vitamin D deficiency, unspecified: Secondary | ICD-10-CM

## 2023-08-06 DIAGNOSIS — I6521 Occlusion and stenosis of right carotid artery: Secondary | ICD-10-CM

## 2023-08-06 DIAGNOSIS — E785 Hyperlipidemia, unspecified: Secondary | ICD-10-CM

## 2023-08-06 NOTE — Progress Notes (Signed)
 Subjective:  Patient ID: Judy Carlson, female    DOB: 07-25-1950  Age: 73 y.o. 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 On Plavix  and ASA now Per hx: " Admit date: 07/25/2023 Discharge date: 07/28/2023   Admitted From: Home Disposition:  home   Recommendations for Outpatient Follow-up:  Follow up with PCP in 1-2 weeks Please obtain BMP/CBC in one week     Home Health:no Equipment/Devices:none Discharge Condition:Stable CODE STATUS:Full Diet recommendation: Heart Healthy   Brief/Interim Summary:  73 y.o. 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 persistent severe stenosis, vascular surgery was consulted and recommended to start IV heparin  and n.p.o., she status post right transcarotid artery revascularization and repair of the right common artery    Discharge Diagnoses:  Principal Problem:   Carotid artery stenosis Active Problems:   HYPERTENSION   Dyslipidemia   CKD (chronic kidney disease) stage 3, GFR 30-59 ml/min (HCC)   COPD (chronic obstructive pulmonary disease) (HCC)   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 She will continue aspirin  and  Plavix . Physical therapy evaluated the patient recommended home health PT. She will follow-up with vascular surgery as an outpatient.   Central hypertension: No changes made to her medication.   Dyslipidemia: Continue Setia.   History of CVA: Continue statin and Plavix .   Chronic kidney disease stage IIIa: Her creatinine appears to be at baseline.   Prediabetes mellitus type II: Diet controlled A1c of 6.5.   COPD: Noted.   History of PVD: Continue aspirin  and Plavix    Discharge Instructions   Discharge Instructions       AMB Referral to Advanced Lipid Disorders Clinic   Complete by: As directed      Internal Lipid Clinic Referral Scheduling   Internal lipid clinic referrals are providers within [facility], who wish to refer established patients for routine management (help in starting PCSK9 inhibitor therapy) or advanced therapies.   Internal MD referral criteria:               1. All patients with LDL>190 mg/dL             2. All patients with Triglycerides >500 mg/dL             3. Patients with suspected or confirmed heterozygous familial hyperlipidemia (HeFH) or homozygous familial hyperlipidemia (HoFH)             4. Patients with family history of suspicious for genetic dyslipidemia desiring genetic testing             5. Patients refractory to standard guideline based therapy             6. Patients  with statin intolerance (failed 2 statins, one of which must be a high potency statin)             7. Patients who the provider desires to be seen by MD     Internal PharmD referral criteria:               1. Follow-up patients for medication management             2. Follow-up for compliance monitoring             3. Patients for drug education             4. Patients with statin intolerance             5. PCSK9 inhibitor education and prior authorization approvals             6. Patients with triglycerides <500 mg/dL   External Lipid Clinic Referral   External  lipid clinic referrals are for providers outside of [facility], considered new clinic patients - automatically routed to MD schedule    Diet - low sodium heart healthy   Complete by: As directed      Increase activity slowly   Complete by: As directed          "  Outpatient Medications Prior to Visit  Medication Sig Dispense Refill   acetaminophen  (TYLENOL ) 500 MG tablet Take 1,000 mg by mouth as needed for mild pain (pain score 1-3), headache or fever.     aspirin  EC 81 MG tablet Take 1 tablet (81 mg total) by mouth daily at 6 (six) AM. Swallow whole. 30 tablet 12   Cholecalciferol  (VITAMIN D3) 50 MCG (2000 UT) capsule TAKE 1 CAPSULE (2,000 UNITS TOTAL) BY MOUTH DAILY. (Patient taking differently: Take 2,000 Units by mouth daily.) 90 capsule 3   clopidogrel  (PLAVIX ) 75 MG tablet Take 1 tablet (75 mg total) by mouth daily. 90 tablet 3   ezetimibe  (ZETIA ) 10 MG tablet TAKE 1 TABLET EVERY DAY 90 tablet 3   furosemide  (LASIX ) 20 MG tablet Take 20 mg by mouth daily.     metoprolol  succinate (TOPROL -XL) 100 MG 24 hr tablet TAKE 1 TABLET DAILY WITH OR IMMEDIATELY FOLLOWING A MEAL. 90 tablet 3   Multiple Vitamin (MULTIVITAMIN) tablet Take 1 tablet by mouth daily.     Olopatadine  HCl (PATADAY  OP) Place 1 drop into both eyes daily as needed (For dry eyes).     pantoprazole  (PROTONIX ) 40 MG tablet Take 1 tablet (40 mg total) by mouth daily. 90 tablet 3   pravastatin  (PRAVACHOL ) 40 MG tablet Take 1 tablet (40 mg total) by mouth daily. 30 tablet 3   traMADol  (ULTRAM ) 50 MG tablet Take 1 tablet (50 mg total) by mouth every 6 (six) hours as needed for severe pain (pain score 7-10). 15 tablet 0   valsartan  (DIOVAN ) 80 MG tablet Take 1 tablet (80 mg total) by mouth daily. 30 tablet 1   No facility-administered medications prior to visit.    ROS: Review of Systems  Constitutional:  Negative for activity change, appetite change, chills, fatigue and unexpected weight change.  HENT:  Negative for  congestion, mouth sores and sinus pressure.   Eyes:  Negative for visual disturbance.  Respiratory:  Negative for cough and chest tightness.   Gastrointestinal:  Negative for abdominal pain and nausea.  Genitourinary:  Negative for difficulty urinating, frequency and vaginal pain.  Musculoskeletal:  Negative for back pain and gait problem.  Skin:  Negative for pallor and rash.  Neurological:  Negative for dizziness, tremors, weakness, numbness and headaches.  Psychiatric/Behavioral:  Negative for confusion, sleep disturbance and suicidal ideas.     Objective:  BP 130/80   Pulse 83   Temp 98.3 F (36.8 C) (Oral)   Ht 5\' 5"  (1.651 m)   Wt 184 lb (83.5 kg)   SpO2 97%   BMI 30.62 kg/m   BP Readings from Last 3 Encounters:  08/06/23 130/80  07/28/23 139/77  07/25/23 (!) 179/77    Wt Readings from Last 3 Encounters:  08/06/23 184 lb (83.5 kg)  07/26/23 186 lb (84.4 kg)  07/25/23 186 lb (84.4 kg)    Physical Exam Constitutional:      General: She is not in acute distress.    Appearance: She is well-developed.  HENT:     Head: Normocephalic.     Right Ear: External ear normal.     Left Ear: External ear normal.     Nose: Nose normal.  Eyes:     General:        Right eye: No discharge.        Left eye: No discharge.     Conjunctiva/sclera: Conjunctivae normal.     Pupils: Pupils are equal, round, and reactive to light.  Neck:     Thyroid : No thyromegaly.     Vascular: No JVD.     Trachea: No tracheal deviation.  Cardiovascular:     Rate and Rhythm: Normal rate and regular rhythm.     Heart sounds: Normal heart sounds.  Pulmonary:     Effort: No respiratory distress.     Breath sounds: No stridor. No wheezing.  Abdominal:     General: Bowel sounds are normal. There is no distension.     Palpations: Abdomen is soft. There is no mass.     Tenderness: There is no abdominal tenderness. There is no guarding or rebound.  Musculoskeletal:        General: No tenderness.      Cervical back: Normal range of motion and neck supple. No rigidity.  Lymphadenopathy:     Cervical: No cervical adenopathy.  Skin:    Findings: No erythema or rash.  Neurological:     Cranial Nerves: No cranial nerve deficit.     Motor: No abnormal muscle tone.     Coordination: Coordination normal.     Deep Tendon Reflexes: Reflexes normal.  Psychiatric:        Behavior: Behavior normal.        Thought Content: Thought content normal.        Judgment: Judgment normal.     Lab Results  Component Value Date   WBC 6.4 07/28/2023   HGB 10.7 (L) 07/28/2023   HCT 33.1 (L) 07/28/2023   PLT 380 07/28/2023   GLUCOSE 107 (H) 07/28/2023   CHOL 238 (H) 07/27/2023   TRIG 97 07/27/2023   HDL 58 07/27/2023   LDLDIRECT 132.9 04/30/2007   LDLCALC 161 (H) 07/27/2023   ALT 23 07/10/2023   AST 33 07/10/2023   NA 140 07/28/2023   K 3.8 07/28/2023   CL 109 07/28/2023   CREATININE 1.33 (H) 07/28/2023   BUN 21 07/28/2023   CO2 21 (L) 07/28/2023   TSH 4.28 12/14/2022   INR 1.1 04/06/2023   HGBA1C 5.6 12/14/2022    DG C-Arm 1-60 Min Result Date: 07/26/2023 CLINICAL DATA:  Trans carotid artery revascularization EXAM: DG C-ARM 1-60 MIN FLUOROSCOPY: Fluoroscopy Time:  5 minutes 26.7 seconds Radiation Exposure Index (if provided by the fluoroscopic device): 42.599 mGy COMPARISON:  None. FINDINGS: Fluoroscopic series are obtained during a carotid revascularization procedure with stent placement. IMPRESSION: Fluoroscopic series are obtained during a carotid revascularization procedure with stent placement. See procedural note for details. Electronically Signed   By: Rozell Cornet M.D.   On: 07/26/2023 20:26   CT ANGIO HEAD NECK W WO CM Result Date: 07/25/2023 CLINICAL DATA:  Known right carotid artery thrombus, now with sensation of neck swelling and ear fullness. EXAM: CT ANGIOGRAPHY HEAD AND NECK WITH AND WITHOUT CONTRAST TECHNIQUE: Multidetector CT imaging of the head and neck was performed  using the standard protocol during bolus administration of intravenous contrast. Multiplanar CT image reconstructions and MIPs were obtained to evaluate the vascular anatomy. Carotid stenosis measurements (when applicable) are obtained utilizing NASCET criteria, using the distal internal carotid diameter as the denominator. RADIATION DOSE REDUCTION: This exam was performed according to the departmental dose-optimization program which includes automated exposure control, adjustment of the mA and/or kV according to patient size and/or use of iterative reconstruction technique. CONTRAST:  75mL OMNIPAQUE  IOHEXOL  350 MG/ML SOLN COMPARISON:  CTA head and neck 07/10/2023 FINDINGS: CT HEAD FINDINGS Brain: There is no evidence of an acute infarct, intracranial hemorrhage, mass, midline shift, or extra-axial fluid collection. Cerebral volume is normal. The ventricles are normal in size. Vascular: Calcified atherosclerosis at the skull base. No hyperdense vessel. Skull: No fracture or suspicious lesion. Sinuses/Orbits: Paranasal sinuses and mastoid air cells are clear. Right cataract extraction. Other: None. Review of the MIP images confirms the above findings CTA NECK FINDINGS Aortic arch: Standard branching with moderate atherosclerosis. Persistent mild-to-moderate stenosis of the origin of the left subclavian artery due to calcified plaque with detailed assessment limited by motion artifact. Right carotid system: Prior endarterectomy. Prominent thrombus in the distal common carotid artery and proximal ICA demonstrates mild interval distal propagation compared to the prior CTA and again results in approximately 75% stenosis of the proximal ICA. The more distal cervical ICA remains widely patent. Left carotid system: Patent with a moderate amount of soft and calcified plaque in the distal common carotid and proximal internal carotid arteries without evidence of a significant stenosis. Vertebral arteries: Patent with the left  being dominant. Unchanged severe right and mild-to-moderate left vertebral artery origin stenoses. Skeleton: Widespread advanced disc degeneration in the cervical spine as described on this months earlier dedicated spine CT. Other neck: No evidence of cervical lymphadenopathy or mass. Upper chest: No mass or consolidation in the included lung apices. Review of the MIP images confirms the above findings CTA HEAD FINDINGS Anterior circulation: The internal carotid arteries are patent from skull base to carotid termini with atherosclerosis resulting in moderate siphon stenoses bilaterally, similar to the prior CTA. ACAs and MCAs are patent without evidence of a proximal branch occlusion or significant proximal stenosis. No aneurysm is identified. Posterior circulation: The intracranial vertebral arteries are patent to the basilar with mild atherosclerotic irregularity but no significant stenosis. Patent left PICA, bilateral AICA, and bilateral SCA origins are visualized. The basilar artery is widely patent. There are small left and diminutive or absent right posterior communicating arteries. The PCAs are patent with an unchanged moderate mid left P2 stenosis. No aneurysm is identified. Venous sinuses: As permitted by contrast timing, patent. Anatomic variants: None. Review of the MIP images confirms the above findings IMPRESSION: 1. No evidence of acute intracranial abnormality. 2. Mild interval distal propagation of thrombus in the right  carotid bulb region with persistent severe stenosis of the proximal ICA of 75% or greater. 3. Unchanged moderate bilateral ICA siphon stenoses and left P2 stenosis. 4. Unchanged severe right and mild-to-moderate left vertebral artery origin stenoses. 5.  Aortic Atherosclerosis (ICD10-I70.0). Electronically Signed   By: Aundra Lee M.D.   On: 07/25/2023 18:20    Assessment & Plan:   Problem List Items Addressed This Visit     HYPERTENSION   Good BP On Diovan /Amlod 160-5 -  take 1/2 tab a day      Relevant Orders   Comprehensive metabolic panel with GFR   CK   Dyslipidemia   Relevant Orders   Comprehensive metabolic panel with GFR   Lipid panel   Vitamin D  deficiency   On Vit D      CKD (chronic kidney disease) stage 3, GFR 30-59 ml/min (HCC) - Primary   Monitor GFR Hydrate well      Relevant Orders   Comprehensive metabolic panel with GFR   Carotid artery stenosis   Right carotid bulb thrombus with persistent severe stenosis, status post right transcarotid artery revascularization and repair of the right common artery. On Plavix  and ASA now On Pravachol  and Zetia       Relevant Orders   Comprehensive metabolic panel with GFR   Lipid panel   CK      No orders of the defined types were placed in this encounter.     Follow-up: Return in about 3 months (around 11/06/2023) for a follow-up visit.  Anitra Barn, MD

## 2023-08-06 NOTE — Assessment & Plan Note (Signed)
Monitor GFR Hydrate well 

## 2023-08-06 NOTE — Assessment & Plan Note (Addendum)
 Right carotid bulb thrombus with persistent severe stenosis, status post right transcarotid artery revascularization and repair of the right common artery. On Plavix  and ASA now On Pravachol  and Zetia 

## 2023-08-06 NOTE — Assessment & Plan Note (Addendum)
 Good BP On Diovan /Amlod 160-5 - take 1/2 tab a day

## 2023-08-06 NOTE — Assessment & Plan Note (Signed)
 On Vit D

## 2023-08-08 ENCOUNTER — Other Ambulatory Visit: Payer: Self-pay | Admitting: *Deleted

## 2023-08-08 NOTE — Patient Instructions (Signed)
 Visit Information  Thank you for taking time to visit with me today. Please don't hesitate to contact me if I can be of assistance to you before our next scheduled telephone appointment.  Our next appointment is by telephone on Thursday 08/15/23 at 11:15 am with nurse Barbie and Thursday 08/22/23 at 11:15 am with nurse Tawny Fate  Please call the care guide team at [phone] if you need to cancel or reschedule your appointment.   Following are the goals we discussed today:  Patient Self Care Activities:  Attend all scheduled provider appointments Call provider office for new concerns or questions  Participate in Transition of Care Program/Attend TOC scheduled calls Take medications as prescribed   Continue pacing activity as your recuperation from recent surgery continues- if you begin to feel lightheaded, please sit or lie down right away Consider periodically monitoring and recording your blood pressures at outpatient pharmacies If you believe your condition is getting worse- contact your care providers (doctors) promptly- reaching out to your doctor early when you have concerns can prevent you from having to go to the hospital  If you are experiencing a Mental Health or Behavioral Health Crisis or need someone to talk to, please  call the Suicide and Crisis Lifeline: 988 call the [place]  National Suicide Prevention Lifeline: 312-278-9048 or TTY: 563-600-5254 TTY 531 062 2539) to talk to a trained counselor call 1-800-273-TALK (toll free, 24 hour hotline) go to [facility] [address], [place] [phone]) call the [facility] Crisis Line: [phone] call 911   Patient verbalizes understanding of instructions and care plan provided today and agrees to view in MyChart. Active MyChart status and patient understanding of how to access instructions and care plan via MyChart confirmed with patient.     Nitzia Perren Mckinney Jalexa Pifer, RN, BSN, Marketing executive  Transitions of Care  [facility] - [facility] [phone]: direct office
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
## 2023-08-10 ENCOUNTER — Emergency Department (HOSPITAL_COMMUNITY)
Admission: EM | Admit: 2023-08-10 | Discharge: 2023-08-11 | Disposition: A | Discharge status: Home / Self Care | Attending: Emergency Medicine | Admitting: Emergency Medicine

## 2023-08-10 ENCOUNTER — Other Ambulatory Visit: Payer: Self-pay

## 2023-08-10 ENCOUNTER — Encounter (HOSPITAL_COMMUNITY): Payer: Self-pay | Admitting: *Deleted

## 2023-08-10 DIAGNOSIS — N189 Chronic kidney disease, unspecified: Secondary | ICD-10-CM | POA: Insufficient documentation

## 2023-08-10 DIAGNOSIS — D649 Anemia, unspecified: Secondary | ICD-10-CM | POA: Diagnosis not present

## 2023-08-10 DIAGNOSIS — Z7982 Long term (current) use of aspirin: Secondary | ICD-10-CM | POA: Insufficient documentation

## 2023-08-10 DIAGNOSIS — I6501 Occlusion and stenosis of right vertebral artery: Secondary | ICD-10-CM | POA: Diagnosis not present

## 2023-08-10 DIAGNOSIS — I129 Hypertensive chronic kidney disease with stage 1 through stage 4 chronic kidney disease, or unspecified chronic kidney disease: Secondary | ICD-10-CM | POA: Diagnosis not present

## 2023-08-10 DIAGNOSIS — J449 Chronic obstructive pulmonary disease, unspecified: Secondary | ICD-10-CM | POA: Diagnosis not present

## 2023-08-10 DIAGNOSIS — Z79899 Other long term (current) drug therapy: Secondary | ICD-10-CM | POA: Diagnosis not present

## 2023-08-10 DIAGNOSIS — I6523 Occlusion and stenosis of bilateral carotid arteries: Secondary | ICD-10-CM | POA: Diagnosis not present

## 2023-08-10 DIAGNOSIS — I10 Essential (primary) hypertension: Secondary | ICD-10-CM | POA: Diagnosis not present

## 2023-08-10 DIAGNOSIS — R030 Elevated blood-pressure reading, without diagnosis of hypertension: Secondary | ICD-10-CM | POA: Diagnosis present

## 2023-08-10 DIAGNOSIS — R42 Dizziness and giddiness: Secondary | ICD-10-CM | POA: Diagnosis not present

## 2023-08-10 DIAGNOSIS — I672 Cerebral atherosclerosis: Secondary | ICD-10-CM | POA: Diagnosis not present

## 2023-08-10 LAB — COMPREHENSIVE METABOLIC PANEL WITH GFR
ALT: 19 U/L (ref 0–44)
AST: 24 U/L (ref 15–41)
Albumin: 3.6 g/dL (ref 3.5–5.0)
Alkaline Phosphatase: 60 U/L (ref 38–126)
Anion gap: 10 (ref 5–15)
BUN: 17 mg/dL (ref 8–23)
CO2: 23 mmol/L (ref 22–32)
Calcium: 9.6 mg/dL (ref 8.9–10.3)
Chloride: 110 mmol/L (ref 98–111)
Creatinine, Ser: 1.54 mg/dL — ABNORMAL HIGH (ref 0.44–1.00)
GFR, Estimated: 35 mL/min — ABNORMAL LOW (ref >60)
Glucose, Bld: 110 mg/dL — ABNORMAL HIGH (ref 70–99)
Potassium: 3.7 mmol/L (ref 3.5–5.1)
Sodium: 143 mmol/L (ref 135–145)
Total Bilirubin: 0.4 mg/dL (ref 0.0–1.2)
Total Protein: 6.5 g/dL (ref 6.5–8.1)

## 2023-08-10 LAB — URINALYSIS, ROUTINE W REFLEX MICROSCOPIC
Bacteria, UA: NONE SEEN
Bilirubin Urine: NEGATIVE
Glucose, UA: NEGATIVE mg/dL
Hgb urine dipstick: NEGATIVE
Ketones, ur: NEGATIVE mg/dL
Nitrite: NEGATIVE
Protein, ur: NEGATIVE mg/dL
Specific Gravity, Urine: 1.013 (ref 1.005–1.030)
pH: 5 (ref 5.0–8.0)

## 2023-08-10 LAB — CBC
HCT: 30.6 % — ABNORMAL LOW (ref 36.0–46.0)
Hemoglobin: 9.7 g/dL — ABNORMAL LOW (ref 12.0–15.0)
MCH: 27.2 pg (ref 26.0–34.0)
MCHC: 31.7 g/dL (ref 30.0–36.0)
MCV: 85.7 fL (ref 80.0–100.0)
Platelets: 330 10*3/uL (ref 150–400)
RBC: 3.57 MIL/uL — ABNORMAL LOW (ref 3.87–5.11)
RDW: 16.8 % — ABNORMAL HIGH (ref 11.5–15.5)
WBC: 6.2 10*3/uL (ref 4.0–10.5)
nRBC: 0 % (ref 0.0–0.2)

## 2023-08-10 NOTE — ED Provider Notes (Signed)
 [place] [facility] AT  HOSPITAL Provider Note   CSN: [id number] Arrival date & time: 08/10/23  1902     History {Add pertinent medical, surgical, social history, OB history to HPI:1} Chief Complaint  Patient presents with   Hypertension    Judy Carlson is a 73 y.o. female.  HPI     Home Medications Prior to Admission medications   Medication Sig Start Date End Date Taking? Authorizing Provider  acetaminophen  (TYLENOL ) 500 MG tablet Take 1,000 mg by mouth as needed for mild pain (pain score 1-3), headache or fever.    [provider]  aspirin  EC 81 MG tablet Take 1 tablet (81 mg total) by mouth daily at 6 (six) AM. Swallow whole. 07/28/23   Macdonald Savoy, MD  Cholecalciferol  (VITAMIN D3) 50 MCG (2000 UT) capsule TAKE 1 CAPSULE (2,000 UNITS TOTAL) BY MOUTH DAILY. Patient taking differently: Take 2,000 Units by mouth daily. 03/14/22   Plotnikov, Oakley Bellman, MD  clopidogrel  (PLAVIX ) 75 MG tablet Take 1 tablet (75 mg total) by mouth daily. 07/28/23   Macdonald Savoy, MD  ezetimibe  (ZETIA ) 10 MG tablet TAKE 1 TABLET EVERY DAY 03/05/23   Plotnikov, Aleksei V, MD  furosemide  (LASIX ) 20 MG tablet Take 20 mg by mouth daily.    [provider]  metoprolol  succinate (TOPROL -XL) 100 MG 24 hr tablet TAKE 1 TABLET DAILY WITH OR IMMEDIATELY FOLLOWING A MEAL. 07/23/23   Plotnikov, Aleksei V, MD  Multiple Vitamin (MULTIVITAMIN) tablet Take 1 tablet by mouth daily.    [provider]  Olopatadine  HCl (PATADAY  OP) Place 1 drop into both eyes daily as needed (For dry eyes).    [provider]  pantoprazole  (PROTONIX ) 40 MG tablet Take 1 tablet (40 mg total) by mouth daily. 07/22/23   Zehr, Jessica D, PA-C  pravastatin  (PRAVACHOL ) 40 MG tablet Take 1 tablet (40 mg total) by mouth daily. 07/29/23   Cordie Deters, PA-C  traMADol  (ULTRAM ) 50 MG tablet Take 1 tablet (50 mg total) by mouth every 6 (six) hours as needed for severe pain  (pain score 7-10). 07/27/23   Cordie Deters, PA-C  valsartan  (DIOVAN ) 80 MG tablet Take 1 tablet (80 mg total) by mouth daily. 06/27/23 06/26/24  Plotnikov, Oakley Bellman, MD      Allergies    Crestor  [rosuvastatin  calcium ], Doxycycline, Lipitor [atorvastatin ], Lisinopril, Naproxen, Nsaids, Pravastatin , Prednisone , Amoxicillin , Clindamycin /lincomycin, and Lovastatin     Review of Systems   Review of Systems  Physical Exam Updated Vital Signs BP (!) 151/105   Pulse 92   Temp 98.3 F (36.8 C)   Resp 16   Ht 5\' 5"  (1.651 m)   Wt 83.5 kg   SpO2 100%   BMI 30.63 kg/m  Physical Exam  ED Results / Procedures / Treatments   Labs (all labs ordered are listed, but only abnormal results are displayed) Labs Reviewed  COMPREHENSIVE METABOLIC PANEL WITH GFR - Abnormal; Notable for the following components:      Result Value   Glucose, Bld 110 (*)    Creatinine, Ser 1.54 (*)    GFR, Estimated 35 (*)    All other components within normal limits  CBC - Abnormal; Notable for the following components:   RBC 3.57 (*)    Hemoglobin 9.7 (*)    HCT 30.6 (*)    RDW 16.8 (*)    All other components within normal limits  URINALYSIS, ROUTINE W REFLEX MICROSCOPIC - Abnormal; Notable for the following components:  APPearance HAZY (*)    Leukocytes,Ua MODERATE (*)    All other components within normal limits    EKG None  Radiology No results found.  Procedures Procedures  {Document cardiac monitor, telemetry assessment procedure when appropriate:1}  Medications Ordered in ED Medications - No data to display  ED Course/ Medical Decision Making/ A&P   {   Click here for ABCD2, HEART and other calculatorsREFRESH Note before signing :1}                              Medical Decision Making  ***  {Document critical care time when appropriate:1} {Document review of labs and clinical decision tools ie heart score, Chads2Vasc2 etc:1}  {Document your independent review of radiology images,  and any outside records:1} {Document your discussion with family members, caretakers, and with consultants:1} {Document social determinants of health affecting pt's care:1} {Document your decision making why or why not admission, treatments were needed:1} Final Clinical Impression(s) / ED Diagnoses Final diagnoses:  None    Rx / DC Orders ED Discharge Orders     None

## 2023-08-10 NOTE — ED Triage Notes (Signed)
 2 weeks ago she had a surgical procedure  for a clot removal but the pt does not know  where the clot was ?? The pt has had 3 high readings  of her bp today one of her bp pills was removed at clot removal

## 2023-08-11 ENCOUNTER — Emergency Department (HOSPITAL_COMMUNITY)

## 2023-08-11 DIAGNOSIS — R42 Dizziness and giddiness: Secondary | ICD-10-CM | POA: Diagnosis not present

## 2023-08-11 DIAGNOSIS — I6501 Occlusion and stenosis of right vertebral artery: Secondary | ICD-10-CM | POA: Diagnosis not present

## 2023-08-11 DIAGNOSIS — I672 Cerebral atherosclerosis: Secondary | ICD-10-CM | POA: Diagnosis not present

## 2023-08-11 DIAGNOSIS — I6523 Occlusion and stenosis of bilateral carotid arteries: Secondary | ICD-10-CM | POA: Diagnosis not present

## 2023-08-11 MED ORDER — IOHEXOL 350 MG/ML SOLN
75.0000 mL | Freq: Once | INTRAVENOUS | Status: AC | PRN
  Administered 2023-08-11: 75 mL via INTRAVENOUS

## 2023-08-11 NOTE — Discharge Instructions (Addendum)
 Please resume taking your previously prescribed blood pressure medication.. Follow up with your PCP for recheck of your blood pressure, and return to the ER with any new severe symptoms.

## 2023-08-12 ENCOUNTER — Other Ambulatory Visit: Payer: Self-pay

## 2023-08-12 ENCOUNTER — Encounter (HOSPITAL_COMMUNITY): Payer: Self-pay

## 2023-08-12 ENCOUNTER — Emergency Department (HOSPITAL_COMMUNITY)

## 2023-08-12 ENCOUNTER — Emergency Department (HOSPITAL_COMMUNITY)
Admission: EM | Admit: 2023-08-12 | Discharge: 2023-08-12 | Disposition: A | Discharge status: Home / Self Care | Attending: Emergency Medicine | Admitting: Emergency Medicine

## 2023-08-12 DIAGNOSIS — I10 Essential (primary) hypertension: Secondary | ICD-10-CM | POA: Insufficient documentation

## 2023-08-12 DIAGNOSIS — R030 Elevated blood-pressure reading, without diagnosis of hypertension: Secondary | ICD-10-CM | POA: Diagnosis present

## 2023-08-12 DIAGNOSIS — R059 Cough, unspecified: Secondary | ICD-10-CM | POA: Insufficient documentation

## 2023-08-12 LAB — URINALYSIS, W/ REFLEX TO CULTURE (INFECTION SUSPECTED)
Bacteria, UA: NONE SEEN
Bilirubin Urine: NEGATIVE
Glucose, UA: NEGATIVE mg/dL
Hgb urine dipstick: NEGATIVE
Ketones, ur: NEGATIVE mg/dL
Nitrite: NEGATIVE
Protein, ur: NEGATIVE mg/dL
Specific Gravity, Urine: 1.017 (ref 1.005–1.030)
pH: 6 (ref 5.0–8.0)

## 2023-08-12 LAB — CBC
HCT: 33.1 % — ABNORMAL LOW (ref 36.0–46.0)
Hemoglobin: 10.5 g/dL — ABNORMAL LOW (ref 12.0–15.0)
MCH: 27.2 pg (ref 26.0–34.0)
MCHC: 31.7 g/dL (ref 30.0–36.0)
MCV: 85.8 fL (ref 80.0–100.0)
Platelets: 362 10*3/uL (ref 150–400)
RBC: 3.86 MIL/uL — ABNORMAL LOW (ref 3.87–5.11)
RDW: 16.8 % — ABNORMAL HIGH (ref 11.5–15.5)
WBC: 5.5 10*3/uL (ref 4.0–10.5)
nRBC: 0 % (ref 0.0–0.2)

## 2023-08-12 LAB — COMPREHENSIVE METABOLIC PANEL WITH GFR
ALT: 24 U/L (ref 0–44)
AST: 34 U/L (ref 15–41)
Albumin: 3.7 g/dL (ref 3.5–5.0)
Alkaline Phosphatase: 60 U/L (ref 38–126)
Anion gap: 8 (ref 5–15)
BUN: 19 mg/dL (ref 8–23)
CO2: 23 mmol/L (ref 22–32)
Calcium: 8.6 mg/dL — ABNORMAL LOW (ref 8.9–10.3)
Chloride: 107 mmol/L (ref 98–111)
Creatinine, Ser: 1.28 mg/dL — ABNORMAL HIGH (ref 0.44–1.00)
GFR, Estimated: 44 mL/min — ABNORMAL LOW (ref >60)
Glucose, Bld: 102 mg/dL — ABNORMAL HIGH (ref 70–99)
Potassium: 3.9 mmol/L (ref 3.5–5.1)
Sodium: 138 mmol/L (ref 135–145)
Total Bilirubin: 0.6 mg/dL (ref 0.0–1.2)
Total Protein: 6.9 g/dL (ref 6.5–8.1)

## 2023-08-12 LAB — TSH: TSH: 1.627 u[IU]/mL (ref 0.350–4.500)

## 2023-08-12 LAB — RESP PANEL BY RT-PCR (RSV, FLU A&B, COVID)  RVPGX2
Influenza A by PCR: NEGATIVE
Influenza B by PCR: NEGATIVE
Resp Syncytial Virus by PCR: NEGATIVE
SARS Coronavirus 2 by RT PCR: NEGATIVE

## 2023-08-12 MED ORDER — EZETIMIBE 10 MG PO TABS: 10.0000 mg | ORAL_TABLET | Freq: Every day | ORAL | 3 refills | Status: AC

## 2023-08-12 NOTE — ED Provider Triage Note (Signed)
 Emergency Medicine Provider Triage Evaluation Note  Judy Carlson , a 73 y.o. female  was evaluated in triage.  Pt complains of recurrent elevated blood pressures and right eyelid twitching.  Review of Systems  Positive: Right eyelid twitching, elevated blood pressure Negative: Headache, neck pain, chest pain, back pain, abdominal pain, nausea, vomiting, constipation, diarrhea, urinary changes  Physical Exam  BP (!) 148/90 (BP Location: Right Arm)   Pulse 74   Temp 98.6 F (37 C) (Oral)   Resp 18   SpO2 100%  Gen:   Awake, no distress   Resp:  Normal effort  MSK:   Moves extremities without difficulty  Other:  No focal neurologic deficits on my exam.  Recent carotid endarterectomy scar not focally tender and well-appearing  Medical Decision Making  Medically screening exam initiated at 3:28 PM.  Appropriate orders placed.  Judy Carlson was informed that the remainder of the evaluation will be completed by another provider, this initial triage assessment does not replace that evaluation, and the importance of remaining in the ED until their evaluation is complete.  Judy Carlson is a 73 y.o. female with a past medical history significant for hypertension, dyslipidemia, CKD, COPD, previous GI bleed, previous stroke, and recent carotid endarterectomy who presents with right eyelid twitching and elevated blood pressure.  According to patient, when her blood pressure gets elevated her right eyelid starts twitching and this lets her know her blood pressure is elevated.  She reports that the last 2 days it has been doing it.  She was seen yesterday in the emergency department when her blood pressure was over 200 systolic.  She had a CT of her neck that did not show acute problem with her surgical site and patient's blood pressure improved.  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  She was brought in for evaluation.  She otherwise is denying significant symptoms including no headache, neck pain, chest pain, palpitations, nausea, vomiting, shortness of breath, constipation, diarrhea, or urinary changes.  She reports she only has the eye twitching.  Patient was concerned about her blood pressure being so high given her recent surgery and her other history.  On exam, lungs clear.  Chest nontender.  Neck nontender.  Surgical scar is well-appearing without drainage or erythema or fluctuance.  No focal neurologic deficits on my exam with intact sensation strength and pulses in extremities.  Normal smile.  Clear speech.  Pupil symmetric and reactive with normal extraocular movements.  I did not hear carotid bruit.  Patient otherwise well-appearing.  I did not see any eye twitching today.  Patient's blood pressure had improved into the 160s and now into the 140s.  We discussed that it could be that the eye twitching is unrelated and this causes her to be nervous about her blood pressure and increases her blood pressure respectively.  However, with her vascular disease and the blood pressure being so high, we will get some screening labs to look for other causes.  Will get screening labs and if workup reassuring, dissipate assessment by provider to determine if she needs further outpatient blood pressure management versus admission for blood pressure stabilization.           Cheney Ewart, Marine Sia, MD 08/12/23 [phone]

## 2023-08-12 NOTE — ED Provider Notes (Signed)
 [place] EMERGENCY DEPARTMENT AT [facility] Provider Note   CSN: [id number] Arrival date & time: 08/12/23  1441     History Chief Complaint  Patient presents with   Hypertension    Judy Carlson is a 73 y.o. female w/ PMHx HTN who presents to the ED for evaluation of HTN.  Patient states she was concerned that her blood pressure was elevated earlier today due to eye twitching.  Patient went to the fire station had blood pressure taken which was 200+ systolic.  Patient states he was taken multiple times but still elevated above 200.  For station called EMS to bring her to the ED.  Patient denies any chest pain shortness of breath vision changes abdominal pain nausea vomiting.  Patient states she was recently admitted and multiple medications were changed.  Patient states she has plans to follow-up with PCP in the next few days to go over her medications including blood pressure medications.       Physical Exam Updated Vital Signs BP (!) 166/87 (BP Location: Right Arm)   Pulse 76   Temp 98.3 F (36.8 C) (Oral)   Resp 16   Ht 5\' 5"  (1.651 m)   Wt 80.7 kg   SpO2 96%   BMI 29.62 kg/m  Physical Exam Vitals and nursing note reviewed.  Constitutional:      General: She is not in acute distress.    Appearance: She is well-developed.  HENT:     Head: Normocephalic and atraumatic.     Mouth/Throat:     Mouth: Mucous membranes are moist.  Eyes:     Extraocular Movements: Extraocular movements intact.     Conjunctiva/sclera: Conjunctivae normal.     Pupils: Pupils are equal, round, and reactive to light.  Cardiovascular:     Rate and Rhythm: Normal rate and regular rhythm.     Heart sounds: No murmur heard. Pulmonary:     Effort: Pulmonary effort is normal. No respiratory distress.     Breath sounds: Normal breath sounds.  Abdominal:     Palpations: Abdomen is soft.     Tenderness: There is no abdominal tenderness.  Skin:    General: Skin is warm and dry.      Capillary Refill: Capillary refill takes less than 2 seconds.  Neurological:     Mental Status: She is alert.  Psychiatric:        Mood and Affect: Mood normal.     ED Results / Procedures / Treatments   Labs (all labs ordered are listed, but only abnormal results are displayed) Labs Reviewed  COMPREHENSIVE METABOLIC PANEL WITH GFR - Abnormal; Notable for the following components:      Result Value   Glucose, Bld 102 (*)    Creatinine, Ser 1.28 (*)    Calcium  8.6 (*)    GFR, Estimated 44 (*)    All other components within normal limits  CBC - Abnormal; Notable for the following components:   RBC 3.86 (*)    Hemoglobin 10.5 (*)    HCT 33.1 (*)    RDW 16.8 (*)    All other components within normal limits  URINALYSIS, W/ REFLEX TO CULTURE (INFECTION SUSPECTED) - Abnormal; Notable for the following components:   APPearance HAZY (*)    Leukocytes,Ua TRACE (*)    All other components within normal limits  RESP PANEL BY RT-PCR (RSV, FLU A&B, COVID)  [id number]  TSH    EKG None  Radiology DG Chest 2  View Result Date: 08/12/2023 CLINICAL DATA:  Cough with elevated blood pressure. EXAM: CHEST - 2 VIEW COMPARISON:  Chest radiographs 07/12/2023 and 06/17/2019. Chest CT 07/19/2023. FINDINGS: The heart size and mediastinal contours are normal. The lungs are clear. There is no pleural effusion or pneumothorax. No acute osseous findings are identified. IMPRESSION: No evidence of acute cardiopulmonary process. Electronically Signed   By: Elmon Hagedorn M.D.   On: 08/12/2023 17:17   CT ANGIO HEAD NECK W WO CM Result Date: 08/11/2023 CLINICAL DATA:  Dizziness status post carotid endarterectomy 2 weeks ago EXAM: CT ANGIOGRAPHY HEAD AND NECK WITH AND WITHOUT CONTRAST TECHNIQUE: Multidetector CT imaging of the head and neck was performed using the standard protocol during bolus administration of intravenous contrast. Multiplanar CT image reconstructions and MIPs were obtained to evaluate the vascular  anatomy. Carotid stenosis measurements (when applicable) are obtained utilizing NASCET criteria, using the distal internal carotid diameter as the denominator. RADIATION DOSE REDUCTION: This exam was performed according to the departmental dose-optimization program which includes automated exposure control, adjustment of the mA and/or kV according to patient size and/or use of iterative reconstruction technique. CONTRAST:  75mL OMNIPAQUE  IOHEXOL  350 MG/ML SOLN COMPARISON:  07/25/2023 FINDINGS: CT HEAD FINDINGS Brain: No mass,hemorrhage or extra-axial collection. Normal appearance of the parenchyma and CSF spaces. Vascular: Atherosclerotic calcification of the internal carotid arteries at the skull base. No abnormal hyperdensity of the major intracranial arteries or dural venous sinuses. Skull: The visualized skull base, calvarium and extracranial soft tissues are normal. Sinuses/Orbits: No fluid levels or advanced mucosal thickening of the visualized paranasal sinuses. No mastoid or middle ear effusion. Normal orbits. CTA NECK FINDINGS Skeleton: No acute abnormality or high grade bony spinal canal stenosis. Other neck: Normal pharynx, larynx and major salivary glands. No cervical lymphadenopathy. Unremarkable thyroid  gland. Upper chest: No pneumothorax or pleural effusion. No nodules or masses. Aortic arch: There is calcific atherosclerosis of the aortic arch. Conventional 3 vessel aortic branching pattern. RIGHT carotid system: Status post carotid endarterectomy. ICA is widely patent. LEFT carotid system: No dissection, occlusion or aneurysm. There is mixed density atherosclerosis extending into the proximal ICA, resulting in less than 50% stenosis. Vertebral arteries: Left dominant configuration. 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 Otherwise normal. CTA HEAD FINDINGS POSTERIOR CIRCULATION: Vertebral arteries are normal. No proximal occlusion of the anterior or  inferior cerebellar arteries. Basilar artery is normal. Superior cerebellar arteries are normal. Posterior cerebral arteries are normal. ANTERIOR CIRCULATION: Atherosclerotic calcification of the internal carotid arteries at the skull base with mild stenosis of the distal cavernous segment. Anterior cerebral arteries are normal. Middle cerebral arteries are normal. Venous sinuses: As permitted by contrast timing, patent. Anatomic variants: None Review of the MIP images confirms the above findings. IMPRESSION: 1. No emergent large vessel occlusion. 2. Status post right carotid endarterectomy without residual stenosis. 3. 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 4. Mild stenosis of the distal cavernous segments of the internal carotid arteries. Aortic Atherosclerosis (ICD10-I70.0). Electronically Signed   By: Juanetta Nordmann M.D.   On: 08/11/2023 02:13    Medications Ordered in ED Medications - No data to display  ED Course/ Medical Decision Making/ A&P  Judy Carlson is a 73 y.o. female presents as detailed above  Differential ddx: Uncontrolled hypertension, hypertensive urgency hypertensive emergency   On arrival, patient afebrile hemodynamically stable no hypoxia or respiratory distress.  ED Work-up: Please see details of labs and imaging  listed above. In summary, no evidence of hypertensive urgency or emergency.  No blood pressure over 200 systolic while in ED.  Patient was noted to have new T wave inversions in the lateral leads.  Patient without chest pain or shortness of breath.  This does not require further emergent workup.  Advised to follow-up with PCP in the next 2 to 3 days for further blood pressure management.  Patient voiced understanding agrees plan    Overall impression hypertension   Patient stable for discharge and outpatient follow-up. Strict return precautions provided. Patient voices understanding and agrees with  plan.   Patient seen with supervising physician who agrees with plan.  Final Clinical Impression(s) / ED Diagnoses Final diagnoses:  Hypertension, unspecified type     Angele Keller, DO PGY-3 Emergency Medicine    Angele Keller, DO 08/12/23 2324    Deatra Face, MD 08/13/23 1552

## 2023-08-12 NOTE — ED Notes (Signed)
 Pt care taken, complaining of her blood pressure not being controlled. Was here last week for the same.

## 2023-08-12 NOTE — Discharge Instructions (Signed)
 Please follow-up with your PCP in the next 2-3 days Thank you for allowing us  to take care of you today.  We hope you begin feeling better soon.   To-Do:  Please follow-up with your primary doctor within the next 2-3 days. Please return to the Emergency Department or call 911 if you experience chest pain, shortness of breath, severe pain, severe fever, altered mental status, or have any reason to think that you need emergency medical care.  Thank you again.  Hope you feel better soon.  [facility] of Emergency Medicine

## 2023-08-12 NOTE — ED Triage Notes (Signed)
 BIB EMS-PT went to Fire department to have blood pressure checked stated her eye was twitching and that typically happens when her pressure is high. Initial pressure was 226/112 at [facility] and [facility] Vitals  164/88 74 HR 16 RR 100% RA CBG 116

## 2023-08-14 ENCOUNTER — Ambulatory Visit: Admitting: Internal Medicine

## 2023-08-14 ENCOUNTER — Ambulatory Visit (INDEPENDENT_AMBULATORY_CARE_PROVIDER_SITE_OTHER): Admitting: Internal Medicine

## 2023-08-14 ENCOUNTER — Encounter: Payer: Self-pay | Admitting: Internal Medicine

## 2023-08-14 VITALS — BP 142/100 | HR 60 | Temp 98.0°F | Ht 65.0 in | Wt 185.0 lb

## 2023-08-14 DIAGNOSIS — I161 Hypertensive emergency: Secondary | ICD-10-CM

## 2023-08-14 DIAGNOSIS — I10 Essential (primary) hypertension: Secondary | ICD-10-CM | POA: Diagnosis not present

## 2023-08-14 DIAGNOSIS — E785 Hyperlipidemia, unspecified: Secondary | ICD-10-CM

## 2023-08-14 MED ORDER — CLONIDINE HCL 0.1 MG PO TABS: 0.1000 mg | ORAL_TABLET | Freq: Three times a day (TID) | ORAL | 3 refills | Status: DC | PRN

## 2023-08-14 NOTE — Assessment & Plan Note (Signed)
 On Zetia , Pravachol 

## 2023-08-14 NOTE — Assessment & Plan Note (Addendum)
 Worse. On Exforge  5-160, Toprol  XL 100 mg and Furosemide   Take Clonidine 0.1 mg tid prn if systolic blood pressure >170  Obtain renin/aldosterone,  Get 24 h urine catecholamines if not better Abd/chest CT were OK

## 2023-08-14 NOTE — Progress Notes (Signed)
 Subjective:  Patient ID: Judy Carlson, female    DOB: 01-29-1951  Age: 73 y.o. MRN: [id number]  CC: Hospitalization Follow-up (Pt as been tracking Bp which been elevated after taking 1 whole pill of her Amlodipine -Valsartan  5/160 mg Tabs... pt is wanting to know if an increase in dosage of this combo tab will be better to help manage BP levels as taking them individually doesn't work... Pt also has been taking off her potassium and is wanting to know is he should be back taking it as well.)   HPI Judy Carlson presents for HTN - SBP>200, pt went to ER twice Taking Exforge  5-/160 every day  On Exforge  5-160, Toprol  XL 100 mg and Furosemide . KCl  Outpatient Medications Prior to Visit  Medication Sig Dispense Refill   acetaminophen  (TYLENOL ) 500 MG tablet Take 1,000 mg by mouth as needed for mild pain (pain score 1-3), headache or fever.     aspirin  EC 81 MG tablet Take 1 tablet (81 mg total) by mouth daily at 6 (six) AM. Swallow whole. 30 tablet 12   Cholecalciferol  (VITAMIN D3) 50 MCG (2000 UT) capsule TAKE 1 CAPSULE (2,000 UNITS TOTAL) BY MOUTH DAILY. (Patient taking differently: Take 2,000 Units by mouth daily.) 90 capsule 3   clopidogrel  (PLAVIX ) 75 MG tablet Take 1 tablet (75 mg total) by mouth daily. 90 tablet 3   ezetimibe  (ZETIA ) 10 MG tablet Take 1 tablet (10 mg total) by mouth daily. 90 tablet 3   furosemide  (LASIX ) 20 MG tablet Take 20 mg by mouth daily.     metoprolol  succinate (TOPROL -XL) 100 MG 24 hr tablet TAKE 1 TABLET DAILY WITH OR IMMEDIATELY FOLLOWING A MEAL. 90 tablet 3   Multiple Vitamin (MULTIVITAMIN) tablet Take 1 tablet by mouth daily.     Olopatadine  HCl (PATADAY  OP) Place 1 drop into both eyes daily as needed (For dry eyes).     pantoprazole  (PROTONIX ) 40 MG tablet Take 1 tablet (40 mg total) by mouth daily. 90 tablet 3   pravastatin  (PRAVACHOL ) 40 MG tablet Take 1 tablet (40 mg total) by mouth daily. 30 tablet 3   traMADol  (ULTRAM ) 50 MG tablet Take 1 tablet  (50 mg total) by mouth every 6 (six) hours as needed for severe pain (pain score 7-10). (Patient not taking: Reported on 08/14/2023) 15 tablet 0   valsartan  (DIOVAN ) 80 MG tablet Take 1 tablet (80 mg total) by mouth daily. (Patient not taking: Reported on 08/14/2023) 30 tablet 1   No facility-administered medications prior to visit.    ROS: Review of Systems  Constitutional:  Negative for activity change, appetite change, chills, fatigue and unexpected weight change.  HENT:  Negative for congestion, mouth sores and sinus pressure.   Eyes:  Negative for visual disturbance.  Respiratory:  Negative for cough and chest tightness.   Gastrointestinal:  Negative for abdominal pain and nausea.  Genitourinary:  Negative for difficulty urinating, frequency and vaginal pain.  Musculoskeletal:  Negative for back pain and gait problem.  Skin:  Negative for pallor and rash.  Neurological:  Negative for dizziness, tremors, weakness, numbness and headaches.  Psychiatric/Behavioral:  Negative for confusion and sleep disturbance. The patient is not nervous/anxious.     Objective:  BP (!) 142/100   Pulse 60   Temp 98 F (36.7 C) (Oral)   Ht 5' 5 (1.651 m)   Wt 185 lb (83.9 kg)   SpO2 97%   BMI 30.79 kg/m   BP Readings from Last 3  Encounters:  08/14/23 (!) 142/100  08/12/23 (!) 166/87  08/11/23 (!) 178/90    Wt Readings from Last 3 Encounters:  08/14/23 185 lb (83.9 kg)  08/12/23 178 lb (80.7 kg)  08/10/23 184 lb 1.4 oz (83.5 kg)    Physical Exam Constitutional:      General: She is not in acute distress.    Appearance: She is well-developed. She is not ill-appearing.  HENT:     Head: Normocephalic.     Right Ear: External ear normal.     Left Ear: External ear normal.     Nose: Nose normal.  Eyes:     General:        Right eye: No discharge.        Left eye: No discharge.     Conjunctiva/sclera: Conjunctivae normal.     Pupils: Pupils are equal, round, and reactive to light.   Neck:     Thyroid : No thyromegaly.     Vascular: No JVD.     Trachea: No tracheal deviation.  Cardiovascular:     Rate and Rhythm: Normal rate and regular rhythm.     Heart sounds: Normal heart sounds.  Pulmonary:     Effort: No respiratory distress.     Breath sounds: No stridor. No wheezing.  Abdominal:     General: Bowel sounds are normal. There is no distension.     Palpations: Abdomen is soft. There is no mass.     Tenderness: There is no abdominal tenderness. There is no guarding or rebound.  Musculoskeletal:        General: No tenderness.     Cervical back: Normal range of motion and neck supple. No rigidity.  Lymphadenopathy:     Cervical: No cervical adenopathy.  Skin:    Findings: No erythema or rash.  Neurological:     Cranial Nerves: No cranial nerve deficit.     Motor: No abnormal muscle tone.     Coordination: Coordination normal.     Deep Tendon Reflexes: Reflexes normal.  Psychiatric:        Behavior: Behavior normal.        Thought Content: Thought content normal.        Judgment: Judgment normal.     Lab Results  Component Value Date   WBC 5.5 08/12/2023   HGB 10.5 (L) 08/12/2023   HCT 33.1 (L) 08/12/2023   PLT 362 08/12/2023   GLUCOSE 102 (H) 08/12/2023   CHOL 238 (H) 07/27/2023   TRIG 97 07/27/2023   HDL 58 07/27/2023   LDLDIRECT 132.9 04/30/2007   LDLCALC 161 (H) 07/27/2023   ALT 24 08/12/2023   AST 34 08/12/2023   NA 138 08/12/2023   K 3.9 08/12/2023   CL 107 08/12/2023   CREATININE 1.28 (H) 08/12/2023   BUN 19 08/12/2023   CO2 23 08/12/2023   TSH 1.627 08/12/2023   INR 1.1 04/06/2023   HGBA1C 5.6 12/14/2022    DG Chest 2 View Result Date: 08/12/2023 CLINICAL DATA:  Cough with elevated blood pressure. EXAM: CHEST - 2 VIEW COMPARISON:  Chest radiographs 07/12/2023 and 06/17/2019. Chest CT 07/19/2023. FINDINGS: The heart size and mediastinal contours are normal. The lungs are clear. There is no pleural effusion or pneumothorax. No acute  osseous findings are identified. IMPRESSION: No evidence of acute cardiopulmonary process. Electronically Signed   By: Elmon Hagedorn M.D.   On: 08/12/2023 17:17    Assessment & Plan:   Problem List Items Addressed This Visit  HYPERTENSION - Primary   Worse. On Exforge  5-160, Toprol  XL 100 mg and Furosemide   Take Clonidine 0.1 mg tid prn if systolic blood pressure >170  Obtain renin/aldosterone,  Get 24 h urine catecholamines if not better Abd/chest CT were OK      Relevant Medications   cloNIDine (CATAPRES) 0.1 MG tablet   Other Relevant Orders   Comprehensive metabolic panel with GFR   Aldosterone + renin activity w/ ratio   Dyslipidemia   On Zetia , Pravachol       Hypertensive emergency   Worse. On Exforge  5-160, Toprol  XL 100 mg and Furosemide   Take Clonidine 0.1 mg tid prn if systolic blood pressure >170  Obtain renin/aldosterone,  Get 24 h urine catecholamines if not better Abd/chest CT were OK      Relevant Medications   cloNIDine (CATAPRES) 0.1 MG tablet      Meds ordered this encounter  Medications   cloNIDine (CATAPRES) 0.1 MG tablet    Sig: Take 1 tablet (0.1 mg total) by mouth 3 (three) times daily as needed (Take if systolic blood pressure >170).    Dispense:  90 tablet    Refill:  3      Follow-up: Return in about 4 weeks (around 09/11/2023) for a follow-up visit.  Anitra Barn, MD

## 2023-08-14 NOTE — Assessment & Plan Note (Signed)
 Worse. On Exforge  5-160, Toprol  XL 100 mg and Furosemide   Take Clonidine 0.1 mg tid prn if systolic blood pressure >170  Obtain renin/aldosterone,  Get 24 h urine catecholamines if not better Abd/chest CT were OK

## 2023-08-15 ENCOUNTER — Telehealth: Payer: Self-pay

## 2023-08-15 ENCOUNTER — Telehealth: Payer: Self-pay | Admitting: Acute Care

## 2023-08-15 DIAGNOSIS — R911 Solitary pulmonary nodule: Secondary | ICD-10-CM

## 2023-08-15 NOTE — Telephone Encounter (Signed)
 Dara Ear, NP reviewed results of recent LDCT as LR2.  Advises due to nodule growth, she is recommending a 6 months follow up, as precaution, instead of waiting a full year. Atherosclerosis and emphysema, as previously noted.  New finding of calcifications of the aortic valve.  Finding may need additional follow up.  Defer note to PCP for recommendation.  Please call patient with results.

## 2023-08-15 NOTE — Addendum Note (Signed)
 Addended by: Deann Exon on: 08/15/2023 09:46 AM   Modules accepted: Orders

## 2023-08-15 NOTE — Telephone Encounter (Signed)
 Spoke with patient to review results of recent LDCT. Verified patient by name and DOB.  Results were read as LR2 and findings reviewed with patient.  Patient is in agreement with 6 months follow up LDCT.  Advised we would message Dr. [name] Kipper with results and ask for his review and recommendation on valve calcifications.  Patient had no questions.  New order placed for 6 months LDCT and results faxed to PCP.

## 2023-08-19 ENCOUNTER — Other Ambulatory Visit: Payer: Self-pay | Admitting: *Deleted

## 2023-08-19 ENCOUNTER — Other Ambulatory Visit (INDEPENDENT_AMBULATORY_CARE_PROVIDER_SITE_OTHER)

## 2023-08-19 DIAGNOSIS — I6521 Occlusion and stenosis of right carotid artery: Secondary | ICD-10-CM | POA: Diagnosis not present

## 2023-08-19 DIAGNOSIS — I10 Essential (primary) hypertension: Secondary | ICD-10-CM

## 2023-08-19 DIAGNOSIS — N1831 Chronic kidney disease, stage 3a: Secondary | ICD-10-CM

## 2023-08-19 DIAGNOSIS — E785 Hyperlipidemia, unspecified: Secondary | ICD-10-CM | POA: Diagnosis not present

## 2023-08-19 LAB — COMPREHENSIVE METABOLIC PANEL WITH GFR
ALT: 56 U/L — ABNORMAL HIGH (ref 0–35)
AST: 46 U/L — ABNORMAL HIGH (ref 0–37)
Albumin: 4 g/dL (ref 3.5–5.2)
Alkaline Phosphatase: 64 U/L (ref 39–117)
BUN: 15 mg/dL (ref 6–23)
CO2: 25 meq/L (ref 19–32)
Calcium: 9.4 mg/dL (ref 8.4–10.5)
Chloride: 106 meq/L (ref 96–112)
Creatinine, Ser: 1.01 mg/dL (ref 0.40–1.20)
GFR: 55.37 mL/min — ABNORMAL LOW (ref >60.00)
Glucose, Bld: 106 mg/dL — ABNORMAL HIGH (ref 70–99)
Potassium: 3.9 meq/L (ref 3.5–5.1)
Sodium: 139 meq/L (ref 135–145)
Total Bilirubin: 0.3 mg/dL (ref 0.2–1.2)
Total Protein: 7.3 g/dL (ref 6.0–8.3)

## 2023-08-19 LAB — LIPID PANEL
Cholesterol: 198 mg/dL (ref 0–200)
HDL: 72.7 mg/dL (ref >39.00)
LDL Cholesterol: 101 mg/dL — ABNORMAL HIGH (ref 0–99)
NonHDL: 125.13
Total CHOL/HDL Ratio: 3
Triglycerides: 123 mg/dL (ref 0.0–149.0)
VLDL: 24.6 mg/dL (ref 0.0–40.0)

## 2023-08-19 LAB — CK: Total CK: 44 U/L (ref 7–177)
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
## 2023-08-22 ENCOUNTER — Telehealth: Admitting: *Deleted

## 2023-08-22 ENCOUNTER — Other Ambulatory Visit: Payer: Self-pay | Admitting: *Deleted
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
## 2023-08-22 NOTE — Patient Instructions (Signed)
 Visit Information  Thank you for taking time to visit with me today. Please don't hesitate to contact me if I can be of assistance to you before our next scheduled telephone appointment.  Our next appointment is by telephone on Friday 08/30/23 at 11:15 am  Please call the care guide team at [phone] if you need to cancel or reschedule your appointment.   Following are the goals we discussed today:  Patient Self Care Activities:  Attend all scheduled provider appointments Call provider office for new concerns or questions  Participate in Transition of Care Program/Attend TOC scheduled calls Take medications as prescribed   Continue pacing activity as your recuperation from recent surgery continues- if you begin to feel lightheaded, please sit or lie down right away Continue monitoring and recording your blood pressures at home as you have been doing: take the as needed clonidine  if your blood pressures are greater than 170 on the top number If you feel lightheaded at any time: lie or sit down with your phone near you; drink fluid, move and change positions very slowly If you believe your condition is getting worse- contact your care providers (doctors) promptly- reaching out to your doctor early when you have concerns can prevent you from having to go to the hospital  If you are experiencing a Mental Health or Behavioral Health Crisis or need someone to talk to, please  call the Suicide and Crisis Lifeline: 988 call the [place]  National Suicide Prevention Lifeline: 234-167-7475 or TTY: 310-054-4658 TTY (272)568-5014) to talk to a trained counselor call 1-800-273-TALK (toll free, 24 hour hotline) go to [facility] [address], [place] [phone]) call the [facility] Crisis Line: [phone] call 911   Patient verbalizes understanding of instructions and care plan provided today and agrees to view in MyChart. Active MyChart status and  patient understanding of how to access instructions and care plan via MyChart confirmed with patient.     Saanya Zieske Mckinney Helen Winterhalter, RN, BSN, Media planner  Transitions of Care  [facility] - [facility] [phone]: direct office

## 2023-08-23 ENCOUNTER — Ambulatory Visit: Payer: Self-pay | Admitting: Internal Medicine

## 2023-08-23 DIAGNOSIS — E785 Hyperlipidemia, unspecified: Secondary | ICD-10-CM

## 2023-08-24 LAB — ALDOSTERONE + RENIN ACTIVITY W/ RATIO
ALDO / PRA Ratio: 12.2 ratio (ref 0.9–28.9)
Aldosterone: 10 ng/dL
Renin Activity: 0.82 ng/mL/h (ref 0.25–5.82)

## 2023-08-28 ENCOUNTER — Other Ambulatory Visit: Payer: Self-pay

## 2023-08-28 DIAGNOSIS — I6529 Occlusion and stenosis of unspecified carotid artery: Secondary | ICD-10-CM

## 2023-08-30 ENCOUNTER — Other Ambulatory Visit: Payer: Self-pay | Admitting: *Deleted

## 2023-08-30 VITALS — BP 117/76

## 2023-08-30 DIAGNOSIS — Z8673 Personal history of transient ischemic attack (TIA), and cerebral infarction without residual deficits: Secondary | ICD-10-CM

## 2023-08-30 DIAGNOSIS — I6521 Occlusion and stenosis of right carotid artery: Secondary | ICD-10-CM

## 2023-08-30 DIAGNOSIS — I10 Essential (primary) hypertension: Secondary | ICD-10-CM

## 2023-08-30 NOTE — Patient Instructions (Signed)
 Visit Information  Thank you for taking time to visit with me today. Please don't hesitate to contact me if I can be of assistance to you before our next scheduled telephone appointment.  It has been a pleasure working with you over the last 30 days!  Great job managing your care after your hospital visit!  Please do not hesitate to contact me in the future if I can be of assistance to you!  Following are the goals we discussed today:  Patient Self Care Activities:  Attend all scheduled provider appointments Call provider office for new concerns or questions  Take medications as prescribed   Continue pacing activity as your recuperation from recent surgery continues- if you begin to feel lightheaded, please sit or lie down right away Continue monitoring and recording your blood pressures at home as you have been doing: take the as needed clonidine  if your blood pressures are greater than 170 on the top number If you feel lightheaded at any time: lie or sit down with your phone near you; drink fluid, move and change positions very slowly If you believe your condition is getting worse- contact your care providers (doctors) promptly- reaching out to your doctor early when you have concerns can prevent you from having to go to the hospita  If you are experiencing a Mental Health or Behavioral Health Crisis or need someone to talk to, please  call the Suicide and Crisis Lifeline: 988 call the [place]  National Suicide Prevention Lifeline: 3098448166 or TTY: 432-646-9088 TTY 539-382-6168) to talk to a trained counselor call 1-800-273-TALK (toll free, 24 hour hotline) go to [facility] [address], [place] [phone]) call the [facility] Crisis Line: 4056879392 call 911   Patient verbalizes understanding of instructions and care plan provided today and agrees to view in MyChart. Active MyChart status and patient understanding of how to access  instructions and care plan via MyChart confirmed with patient.     Nanami Whitelaw Mckinney Khylei Wilms, RN, BSN, Media planner  Transitions of Care  [facility] - [facility] [phone]: direct office
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
## 2023-09-02 ENCOUNTER — Telehealth: Payer: Self-pay | Admitting: *Deleted

## 2023-09-02 NOTE — Progress Notes (Signed)
 Chart reviewed and had viral testing to look for a viral cause of her symptoms and vital sign changes in the setting of the ongoing pandemic and viral amount in the community.

## 2023-09-02 NOTE — Progress Notes (Signed)
 Complex Care Management Note  Care Guide Note 09/02/2023 Name: Judy Carlson MRN: [id number] DOB: 20-May-1950  Judy Carlson is a 73 y.o. year old female who sees Plotnikov, Aleksei V, MD for primary care. I reached out to Merlynn GORMAN Bohr by phone today to offer complex care management services.  [name] was given information about Complex Care Management services today including:   The Complex Care Management services include support from the care team which includes your Nurse Care Manager, Clinical Social Worker, or Pharmacist.  The Complex Care Management team is here to help remove barriers to the health concerns and goals most important to you. Complex Care Management services are voluntary, and the patient may decline or stop services at any time by request to their care team member.   Complex Care Management Consent Status: Patient agreed to services and verbal consent obtained.   Follow up plan:  Telephone appointment with complex care management team member scheduled for:  09/10/2023  Encounter Outcome:  Patient Scheduled  Thedford Franks, CMA [place]  [facility], [facility] Guide Direct Dial: [phone]  Fax: [phone] Website: [email or web address]

## 2023-09-10 ENCOUNTER — Other Ambulatory Visit: Payer: Self-pay

## 2023-09-10 NOTE — Patient Instructions (Signed)
 Visit Information  Thank you for taking time to visit with me today. Please don't hesitate to contact me if I can be of assistance to you before our next scheduled appointment.  Our next appointment is by telephone on 09/24/23 at 2:pm Please call the care guide team at [phone] if you need to cancel or reschedule your appointment.   Following is a copy of your care plan:   Goals Addressed             This Visit's Progress    VBCI RN Care Plan       Problems:  Chronic Disease Management support and education needs related to HTN and recent R Carotid Artery revascularization/repair(May 2025)  Goal: Over the next 90 days the Patient will attend all scheduled medical appointments: labs/duplex u/s and surgeon on 09/12/23 and primary care provider on 09/17/23  as evidenced by patient report and or chart review        continue to work with RN Care Manager and/or Social Worker to address care management and care coordination needs related to HTN as evidenced by adherence to care management team scheduled appointments     take all medications exactly as prescribed and will call provider for medication related questions as evidenced by patient report and or review of chart    verbalize basic understanding of HTN and management of R carotid thrombosis revascularization/ disease process and self health management plan as evidenced by patient report and/or chart review  Interventions:   Hypertension Interventions: Last practice recorded BP readings:  BP Readings from Last 3 Encounters:  09/10/23 139/87  08/30/23 117/76  08/22/23 (!) 147/87   Most recent eGFR/CrCl: No results found for: EGFR  No components found for: CRCL  Evaluation of current treatment plan related to hypertension self management and patient's adherence to plan as established by provider Reviewed medications with patient and discussed importance of compliance Discussed plans with patient for ongoing care management  follow up and provided patient with direct contact information for care management team Reviewed scheduled/upcoming provider appointments including:  Advised patient to discuss any health questions or concerns  with provider Discussed complications of poorly controlled blood pressure such as heart disease, stroke, circulatory complications, vision complications, kidney impairment, sexual dysfunction Screening for signs and symptoms of depression related to chronic disease state  Assessed social determinant of health barriers Counseled on importance of checking BP and HR. Advised to take provider office at next visit Discussed healthy eating habits  Patient Self-Care Activities:  Attend all scheduled provider appointments Call provider office for new concerns or questions  Take medications as prescribed   keep a blood pressure log take blood pressure log to all doctor appointments keep all doctor appointments Eat Healthy  Plan:  Telephone follow up appointment with care management team member scheduled for:  09/24/23 at 2:00 pm      Please call the Suicide and Crisis Lifeline: 988 call the [place]  National Suicide Prevention Lifeline: 443-496-8587 or TTY: 856 362 1812 TTY 813-272-5453) to talk to a trained counselor if you are experiencing a Mental Health or Behavioral Health Crisis or need someone to talk to.  Patient verbalizes understanding of instructions and care plan provided today and agrees to view in MyChart. Active MyChart status and patient understanding of how to access instructions and care plan via MyChart confirmed with patient.     Heddy Shutter, RN, MSN, BSN, CCM [place]  [facility], Population Health Case Manager Phone: [phone]
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
## 2023-09-12 ENCOUNTER — Other Ambulatory Visit (INDEPENDENT_AMBULATORY_CARE_PROVIDER_SITE_OTHER)

## 2023-09-12 ENCOUNTER — Ambulatory Visit: Attending: Vascular Surgery | Admitting: Physician Assistant

## 2023-09-12 ENCOUNTER — Ambulatory Visit (HOSPITAL_COMMUNITY)
Admission: RE | Admit: 2023-09-12 | Discharge: 2023-09-12 | Disposition: A | Discharge status: Home / Self Care | Source: Ambulatory Visit | Attending: Vascular Surgery | Admitting: Vascular Surgery

## 2023-09-12 VITALS — BP 106/73 | HR 82 | Temp 97.9°F | Ht 65.0 in | Wt 182.6 lb

## 2023-09-12 DIAGNOSIS — I6529 Occlusion and stenosis of unspecified carotid artery: Secondary | ICD-10-CM | POA: Insufficient documentation

## 2023-09-12 DIAGNOSIS — I6521 Occlusion and stenosis of right carotid artery: Secondary | ICD-10-CM

## 2023-09-12 DIAGNOSIS — E785 Hyperlipidemia, unspecified: Secondary | ICD-10-CM | POA: Diagnosis not present

## 2023-09-12 LAB — COMPREHENSIVE METABOLIC PANEL WITH GFR
ALT: 30 U/L (ref 0–35)
AST: 25 U/L (ref 0–37)
Albumin: 4.4 g/dL (ref 3.5–5.2)
Alkaline Phosphatase: 72 U/L (ref 39–117)
BUN: 16 mg/dL (ref 6–23)
CO2: 24 meq/L (ref 19–32)
Calcium: 9.9 mg/dL (ref 8.4–10.5)
Chloride: 107 meq/L (ref 96–112)
Creatinine, Ser: 1.41 mg/dL — ABNORMAL HIGH (ref 0.40–1.20)
GFR: 37.09 mL/min — ABNORMAL LOW (ref >60.00)
Glucose, Bld: 136 mg/dL — ABNORMAL HIGH (ref 70–99)
Potassium: 3.4 meq/L — ABNORMAL LOW (ref 3.5–5.1)
Sodium: 140 meq/L (ref 135–145)
Total Bilirubin: 0.3 mg/dL (ref 0.2–1.2)
Total Protein: 7.6 g/dL (ref 6.0–8.3)

## 2023-09-12 LAB — LIPID PANEL
Cholesterol: 178 mg/dL (ref 0–200)
HDL: 67.3 mg/dL (ref >39.00)
LDL Cholesterol: 89 mg/dL (ref 0–99)
NonHDL: 110.86
Total CHOL/HDL Ratio: 3
Triglycerides: 111 mg/dL (ref 0.0–149.0)
VLDL: 22.2 mg/dL (ref 0.0–40.0)
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
## 2023-09-13 ENCOUNTER — Other Ambulatory Visit: Payer: Self-pay | Admitting: *Deleted

## 2023-09-13 ENCOUNTER — Ambulatory Visit: Payer: Self-pay | Admitting: Internal Medicine

## 2023-09-13 DIAGNOSIS — I6529 Occlusion and stenosis of unspecified carotid artery: Secondary | ICD-10-CM

## 2023-09-13 DIAGNOSIS — I6521 Occlusion and stenosis of right carotid artery: Secondary | ICD-10-CM

## 2023-09-17 ENCOUNTER — Ambulatory Visit: Admitting: Internal Medicine

## 2023-09-19 ENCOUNTER — Other Ambulatory Visit: Payer: Self-pay | Admitting: Internal Medicine

## 2023-09-19 NOTE — Telephone Encounter (Unsigned)
 Copied from CRM [id number]. Topic: Clinical - Medication Refill >> Sep 19, 2023 12:28 PM Mia F wrote: Medication: furosemide  (LASIX ) 40 MG tablet & Amlodipine /Valsartin 5-160MG  Tab (not listed in list of medications. Pt said it was stopped when she had surgery)  Has the patient contacted their pharmacy? No (Agent: If no, request that the patient contact the pharmacy for the refill. If patient does not wish to contact the pharmacy document the reason why and proceed with request.) (Agent: If yes, when and what did the pharmacy advise?)  This is the patient's preferred pharmacy:    [facility] [address], [place], [place] [address] Phone: [phone]  Is this the correct pharmacy for this prescription? Yes If no, delete pharmacy and type the correct one.   Has the prescription been filled recently? No  Is the patient out of the medication? No  Has the patient been seen for an appointment in the last year OR does the patient have an upcoming appointment? Yes  Can we respond through MyChart? Yes  Agent: Please be advised that Rx refills may take up to 3 business days. We ask that you follow-up with your pharmacy.

## 2023-09-20 MED ORDER — FUROSEMIDE 20 MG PO TABS: 20.0000 mg | ORAL_TABLET | Freq: Every day | ORAL | 3 refills | Status: DC

## 2023-09-24 ENCOUNTER — Other Ambulatory Visit: Payer: Self-pay

## 2023-09-24 VITALS — BP 168/105 | HR 85

## 2023-09-24 DIAGNOSIS — I10 Essential (primary) hypertension: Secondary | ICD-10-CM

## 2023-09-24 DIAGNOSIS — F419 Anxiety disorder, unspecified: Secondary | ICD-10-CM
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
## 2023-09-24 NOTE — Patient Instructions (Signed)
 Visit Information  Thank you for taking time to visit with me today. Please don't hesitate to contact me if I can be of assistance to you before our next scheduled appointment.  Our next appointment is by telephone on 10/23/23 at 2:00 pm Please call the care guide team at [phone] if you need to cancel or reschedule your appointment.   Following is a copy of your care plan:   Goals Addressed             This Visit's Progress    VBCI RN Care Plan       Problems:  Chronic Disease Management support and education needs related to HTN and recent R Carotid Artery revascularization/repair(May 2025) Back pain 4/10- (patient denies back pain at this time 09/24/23). Fall risk Depression-patient acknowledges some depression-agreeable to social work referral  Goal: Over the next 90 days the Patient will attend all scheduled medical appointments: with providers as evidenced by patient report and or chart review        continue to work with Medical illustrator and/or Social Worker to address care management and care coordination needs related to HTN as evidenced by adherence to care management team scheduled appointments     take all medications exactly as prescribed and will call provider for medication related questions as evidenced by patient report and or review of chart    verbalize basic understanding of HTN and management of R carotid thrombosis revascularization/ disease process and self health management plan as evidenced by patient report and/or chart review work with Child psychotherapist to address Mental Health Concerns  related to the management of HTN as evidenced by review of electronic medical record and patient or social worker report      Interventions:   Hypertension Interventions: Last practice recorded BP readings:  BP Readings from Last 3 Encounters:  09/24/23 (!) 168/105  09/12/23 106/73  09/10/23 139/87   Most recent eGFR/CrCl: No results found for: EGFR  No components  found for: CRCL  Evaluation of current treatment plan related to hypertension self management and patient's adherence to plan as established by provider Reviewed medications with patient and discussed importance of compliance Discussed plans with patient for ongoing care management follow up and provided patient with direct contact information for care management team Reviewed scheduled/upcoming provider appointments including:  Advised patient to discuss any health questions or concerns  with provider Discussed complications of poorly controlled blood pressure such as heart disease, stroke, circulatory complications, vision complications, kidney impairment, sexual dysfunction Screening for signs and symptoms of depression related to chronic disease state  Counseled on importance of continuing to check BP and HR. Advised to take prn clonidine  as prescribed for BP systolic >170. And notify primary care if questions or concerns.  Advised patient on the how to check BP using a wrist BP monitor to have the cuff at the level of the heart with arm crossed over chest, feet flat on floor, encouraged patient to remain still and do not talk while taking.  Advised patient to take BP monitor with her to upcoming appointment scheduled for 09/26/23 to check monitor for accuracy.   Encouraged patient to discuss pain/soreness to chest area following episode where she feel and hit chest leaning over to pick something up on 09/13/23.   Assigned Emmi video: emmi education: how to measure blood pressure, HTN, relieving stress, lifestyle changes to lower Blood pressure  Fall risk Interventions: Discussed fall prevention strategies Advised patient to contact provider to notify  if any future episodes Advised patient to attend upcoming appointment on 09/26/23. Provided educational material fall prevention  Patient Self-Care Activities:  Attend all scheduled provider appointments Call provider office for new concerns  or questions  Take medications as prescribed   keep a blood pressure log take blood pressure log to all doctor appointments keep all doctor appointments Eat Healthy, monitor salt intake, eat lean meats, vegetables/fruits. Avoid food with trans fats and high in saturated fats Review emmi education: how to measure blood pressure, HTN, relieving stress, lifestyle changes to lower Blood pressure  Plan:  Telephone follow up appointment with care management team member scheduled for:  10/23/23 at 2:00 pm      Please call the Suicide and Crisis Lifeline: 988 call the [place]  National Suicide Prevention Lifeline: 413-234-1357 or TTY: (409)049-1237 TTY (408)243-7886) to talk to a trained counselor call 1-800-273-TALK (toll free, 24 hour hotline) if you are experiencing a Mental Health or Behavioral Health Crisis or need someone to talk to.  Patient verbalizes understanding of instructions and care plan provided today and agrees to view in MyChart. Active MyChart status and patient understanding of how to access instructions and care plan via MyChart confirmed with patient.     Heddy Shutter, RN, MSN, BSN, CCM [place]  [facility], Population Health Case Manager Phone: [phone]   Understanding Your Risk for Falls Millions of people have serious injuries from falls each year. It is important to understand your risk of falling. Talk with your health care provider about your risk and what you can do to lower it. If you do have a serious fall, make sure to tell your provider. Falling once raises your risk of falling again. How can falls affect me? Serious injuries from falls are common. These include: Broken bones, such as hip fractures. Head injuries, such as traumatic brain injuries (TBI) or concussions. A fear of falling can cause you to avoid activities and stay at home. This can make your muscles weaker and raise your risk for a fall. What can increase my risk? There are  a number of risk factors that increase your risk for falling. The more risk factors you have, the higher your risk of falling. Serious injuries from a fall happen most often to people who are older than 73 years old. Teenagers and young adults ages 14-29 are also at higher risk. Common risk factors include: Weakness in the lower body. Being generally weak or confused due to long-term (chronic) illness. Dizziness or balance problems. Poor vision. Medicines that cause dizziness or drowsiness. These may include: Medicines for your blood pressure, heart, anxiety, insomnia, or swelling (edema). Pain medicines. Muscle relaxants. Other risk factors include: Drinking alcohol. Having had a fall in the past. Having foot pain or wearing improper footwear. Working at a dangerous job. Having any of the following in your home: Tripping hazards, such as floor clutter or loose rugs. Poor lighting. Pets. Having dementia or memory loss. What actions can I take to lower my risk of falling?     Physical activity Stay physically fit. Do strength and balance exercises. Consider taking a regular class to build strength and balance. Yoga and tai chi are good options. Vision Have your eyes checked every year and your prescription for glasses or contacts updated as needed. Shoes and walking aids Wear non-skid shoes. Wear shoes that have rubber soles and low heels. Do not wear high heels. Do not walk around the house in socks or slippers. Use a cane  or walker as told by your provider. Home safety Attach secure railings on both sides of your stairs. Install grab bars for your bathtub, shower, and toilet. Use a non-skid mat in your bathtub or shower. Attach bath mats securely with double-sided, non-slip rug tape. Use good lighting in all rooms. Keep a flashlight near your bed. Make sure there is a clear path from your bed to the bathroom. Use night-lights. Do not use throw rugs. Make sure all carpeting is  taped or tacked down securely. Remove all clutter from walkways and stairways, including extension cords. Repair uneven or broken steps and floors. Avoid walking on icy or slippery surfaces. Walk on the grass instead of on icy or slick sidewalks. Use ice melter to get rid of ice on walkways in the winter. Use a cordless phone. Questions to ask your health care provider Can you help me check my risk for a fall? Do any of my medicines make me more likely to fall? Should I take a vitamin D  supplement? What exercises can I do to improve my strength and balance? Should I make an appointment to have my vision checked? Do I need a bone density test to check for weak bones (osteoporosis)? Would it help to use a cane or a walker? Where to find more information Centers for Disease Control and Prevention, STEADI: TonerPromos.no Community-Based Fall Prevention Programs: TonerPromos.no [employer] on Aging: BaseRingTones.pl Contact a health care provider if: You fall at home. You are afraid of falling at home. You feel weak, drowsy, or dizzy. This information is not intended to replace advice given to you by your health care provider. Make sure you discuss any questions you have with your health care provider. Document Revised: 10/23/2021 Document Reviewed: 10/23/2021 Elsevier Patient Education  2024 [employer].

## 2023-09-26 ENCOUNTER — Ambulatory Visit: Admitting: Internal Medicine

## 2023-09-26 ENCOUNTER — Encounter: Payer: Self-pay | Admitting: Internal Medicine

## 2023-09-26 VITALS — BP 118/68 | HR 74 | Temp 98.0°F | Ht 65.0 in | Wt 184.0 lb

## 2023-09-26 DIAGNOSIS — I10 Essential (primary) hypertension: Secondary | ICD-10-CM

## 2023-09-26 DIAGNOSIS — F419 Anxiety disorder, unspecified: Secondary | ICD-10-CM | POA: Diagnosis not present

## 2023-09-26 DIAGNOSIS — E559 Vitamin D deficiency, unspecified: Secondary | ICD-10-CM | POA: Diagnosis not present

## 2023-09-26 DIAGNOSIS — N1831 Chronic kidney disease, stage 3a: Secondary | ICD-10-CM | POA: Diagnosis not present

## 2023-09-26 DIAGNOSIS — F32A Depression, unspecified: Secondary | ICD-10-CM | POA: Diagnosis not present

## 2023-09-26 MED ORDER — ESCITALOPRAM OXALATE 5 MG PO TABS: 5.0000 mg | ORAL_TABLET | Freq: Every day | ORAL | 1 refills | Status: DC
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
## 2023-09-26 NOTE — Assessment & Plan Note (Signed)
Monitor GFR Hydrate well 

## 2023-09-26 NOTE — Assessment & Plan Note (Signed)
 On Vit D

## 2023-09-26 NOTE — Patient Instructions (Signed)
 Get a rib belt

## 2023-09-26 NOTE — Assessment & Plan Note (Signed)
Worse  Start Lexapro 

## 2023-09-26 NOTE — Assessment & Plan Note (Signed)
 On Valsartan , Toprol 

## 2023-10-07 ENCOUNTER — Ambulatory Visit: Payer: Self-pay

## 2023-10-07 DIAGNOSIS — I10 Essential (primary) hypertension: Secondary | ICD-10-CM

## 2023-10-07 MED ORDER — AMLODIPINE BESYLATE-VALSARTAN 5-160 MG PO TABS: 1.0000 | ORAL_TABLET | Freq: Every day | ORAL | 0 refills | Status: DC

## 2023-10-07 NOTE — Telephone Encounter (Signed)
 FYI Only or Action Required?: Action required by provider: Medication refill for Judy /Valsartan  or new BP medication request.  Patient was last seen in primary care on 09/26/2023 by Plotnikov, Judy GAILS, MD.  Called Nurse Triage reporting Hypertension.  Symptoms began today.  Interventions attempted: Prescription medications: Clonidine .  Symptoms are: gradually improving.  Triage Disposition: See PCP When Office is Open (Within 3 Days)  Patient/caregiver understands and will follow disposition?: No, wishes to speak with PCP      Copied from CRM #[id number]. Topic: Clinical - Medical Advice >> Oct 07, 2023  9:04 AM Judy Carlson wrote: Reason for CRM: Patient called in on 09/19/2023 in regards to a refill for Judy /Valsartin Carlson , patient has not heard back. She is calling in check on status. Please give her a call back #[phone]. Blood pressure 121/106 has been taking the blood pressure pill that she's supposed to take when her blood pressure is 170/100 way more often. She needs something else to take if she is not going to be prescribed the Judy /Valsartin.       Reason for Disposition  Systolic BP >= 160 OR Diastolic >= 100  Answer Assessment - Initial Assessment Questions Patient would like to know if her Judy /Valsartan  can be refilled or if another medication could be prescribed due to her taking her Clonidine  regularly for her blood pressure.      1. BLOOD PRESSURE: What is your blood pressure? Did you take at least two measurements 5 minutes apart?     161/104 this morning, now 161/94 after taking Clonidine  2. ONSET: When did you take your blood pressure?     Today  3. HOW: How did you take your blood pressure? (e.g., automatic home BP monitor, visiting nurse)     Automatic BP cuff 4. HISTORY: Do you have a history of high blood pressure?     Yes 5. MEDICINES: Are you taking any medicines for blood pressure? Have you missed any doses  recently?     Takes PRN medication 6. OTHER SYMPTOMS: Do you have any symptoms? (e.g., blurred vision, chest pain, difficulty breathing, headache, weakness)     Right eye twitching which is consistent with elevated blood pressures  Protocols used: Blood Pressure - High-A-AH

## 2023-10-07 NOTE — Addendum Note (Signed)
 Addended by: CLEATUS SUZEN SAILOR on: 10/07/2023 05:36 PM   Modules accepted: Orders

## 2023-10-07 NOTE — Telephone Encounter (Signed)
 Patient in need of refill as her BP has been elevating. She has been taking half table of the Exforge  and will need refill soon.   Spoke with MD, okay to refill. Spoke with patient she is aware the RX refill sent to preferred pharmacy [facility].   Send in 30-supply. Patient to keep track of BP over the next 2 weeks if continues to be elevated greater than 140/90 then please call out office so we can adjust dosage or schedule appointment to consider other options

## 2023-10-15 DIAGNOSIS — I129 Hypertensive chronic kidney disease with stage 1 through stage 4 chronic kidney disease, or unspecified chronic kidney disease: Secondary | ICD-10-CM | POA: Diagnosis not present

## 2023-10-15 DIAGNOSIS — E785 Hyperlipidemia, unspecified: Secondary | ICD-10-CM | POA: Diagnosis not present

## 2023-10-15 DIAGNOSIS — N183 Chronic kidney disease, stage 3 unspecified: Secondary | ICD-10-CM | POA: Diagnosis not present

## 2023-10-15 DIAGNOSIS — Z008 Encounter for other general examination: Secondary | ICD-10-CM | POA: Diagnosis not present

## 2023-10-15 DIAGNOSIS — F321 Major depressive disorder, single episode, moderate: Secondary | ICD-10-CM | POA: Diagnosis not present

## 2023-10-21 ENCOUNTER — Other Ambulatory Visit: Payer: Self-pay | Admitting: *Deleted
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
## 2023-10-22 NOTE — Patient Instructions (Signed)
 Visit Information  Thank you for taking time to visit with me today. Please don't hesitate to contact me if I can be of assistance to you before our next scheduled appointment.  Our next appointment is by telephone on 11/05/23 at 10:00am Please call the care guide team at [phone] if you need to cancel or reschedule your appointment.   Following is a copy of your care plan:   Goals Addressed             This Visit's Progress    VBCI Social Work Care Plan       Problems:   Mental Health Concerns related to grief and loss  CSW Clinical Goal(s):   Over the next 90 days the Patient will will follow up with Grief Share as directed by Social Work.  Interventions:  Mental Health:  Evaluation of current treatment plan related to Grief Stages of grief discussed-grief response normalized Active listening / Reflection utilized Discussed referral options to connect for ongoing therapy: Grief Share resources provided Emotional Support Provided PHQ2/PHQ9 completed Problem Solving /Task Center strategies reviewed Solution-Focued Strategies employed:discussed active participation in grief share, breathing exercises, prioritizing tasks spiritual practices  Patient Goals/Self-Care Activities:  Continue taking your medication as prescribed.   Connect with Grief Share    Plan:   Telephone follow up appointment with care management team member scheduled for:  11/05/23        Please call the Suicide and Crisis Lifeline: 988 call the [place]  National Suicide Prevention Lifeline: 985-664-7619 or TTY: (917) 518-9228 TTY 201-151-7248) to talk to a trained counselor call 1-800-273-TALK (toll free, 24 hour hotline) if you are experiencing a Mental Health or Behavioral Health Crisis or need someone to talk to.  Patient verbalizes understanding of instructions and care plan provided today and agrees to view in MyChart. Active MyChart status and patient understanding of how to access  instructions and care plan via MyChart confirmed with patient.     Elizabeth Haff, LCSW [place]  [facility], [facility] Licensed Clinical Social Worker  Direct Dial: [phone]

## 2023-10-23 ENCOUNTER — Ambulatory Visit: Payer: Self-pay

## 2023-10-23 ENCOUNTER — Other Ambulatory Visit: Payer: Self-pay
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
## 2023-10-23 NOTE — Telephone Encounter (Signed)
 FYI Only or Action Required?: FYI only for provider.  Patient was last seen in primary care on 09/26/2023 by Plotnikov, Karlynn GAILS, MD.  Called Nurse Triage reporting Hypertension.  Symptoms began several days ago.  Interventions attempted: Prescription medications:  SABRA  Symptoms are: unchanged.Pt. Wants PCP aware of recent BP readings. Had mild headache yesterday.No symptoms today.  Triage Disposition: See PCP When Office is Open (Within 3 Days)  Patient/caregiver understands and will follow disposition?: No, wishes to speak with PCP   Reason for Disposition  Systolic BP >= 160 OR Diastolic >= 100  Answer Assessment - Initial Assessment Questions 1. BLOOD PRESSURE: What is your blood pressure? Did you take at least two measurements 5 minutes apart?     Before medication - 156/97   after medicine 122/80  before 2:00 pm       Yesterday's - 115/72 am, 12:00- 150/97, 3:30 - 150/91 and 6:00 - 151/87  2. ONSET: When did you take your blood pressure?     This week 3. HOW: How did you take your blood pressure? (e.g., automatic home BP monitor, visiting nurse)     Home cuff 4. HISTORY: Do you have a history of high blood pressure?     yes 5. MEDICINES: Are you taking any medicines for blood pressure? Have you missed any doses recently?     no 6. OTHER SYMPTOMS: Do you have any symptoms? (e.g., blurred vision, chest pain, difficulty breathing, headache, weakness)     Headache yesterday 7. PREGNANCY: Is there any chance you are pregnant? When was your last menstrual period?     no  Protocols used: Blood Pressure - High-A-AH

## 2023-10-23 NOTE — Patient Instructions (Signed)
 Visit Information  Thank you for taking time to visit with me today. Please don't hesitate to contact me if I can be of assistance to you before our next scheduled appointment.  Your next care management appointment is by telephone on 11/19/23 at 10am  Please call the care guide team at [phone] if you need to cancel, schedule, or reschedule an appointment.   Please call the Suicide and Crisis Lifeline: 988 call the [place]  National Suicide Prevention Lifeline: 239-178-2058 or TTY: 239-344-7604 TTY 416-534-6093) to talk to a trained counselor call 1-800-273-TALK (toll free, 24 hour hotline) if you are experiencing a Mental Health or Behavioral Health Crisis or need someone to talk to.  Heddy Shutter, RN, MSN, BSN, CCM [place]  [facility], Population Health Case Manager Phone: [phone]

## 2023-10-28 NOTE — Telephone Encounter (Signed)
 Noted.  If blood pressure remains elevated we need to increase her valsartan -amlodipine  to 320/5 dose.  Thanks

## 2023-10-31 ENCOUNTER — Other Ambulatory Visit: Payer: Self-pay | Admitting: Internal Medicine

## 2023-10-31 DIAGNOSIS — I10 Essential (primary) hypertension: Secondary | ICD-10-CM

## 2023-10-31 MED ORDER — AMLODIPINE BESYLATE-VALSARTAN 5-160 MG PO TABS: 1.0000 | ORAL_TABLET | Freq: Every day | ORAL | 0 refills | Status: DC

## 2023-10-31 NOTE — Telephone Encounter (Signed)
 Med refill

## 2023-10-31 NOTE — Telephone Encounter (Signed)
 Copied from CRM #[id number]. Topic: Clinical - Medication Refill >> Oct 31, 2023 10:30 AM Lauren C wrote: Medication: amLODipine -valsartan  (EXFORGE ) 5-160 MG tablet  Has the patient contacted their pharmacy? Yes (Agent: If no, request that the patient contact the pharmacy for the refill. If patient does not wish to contact the pharmacy document the reason why and proceed with request.) (Agent: If yes, when and what did the pharmacy advise?)  [facility] calling to get refill.  This is the patient's preferred pharmacy:   [facility] [place], [name] - [facility] AT Portal to Registered Caremark Sites [address] [name] [address] Phone: [phone] Fax: [phone]   Is this the correct pharmacy for this prescription? Yes If no, delete pharmacy and type the correct one.   Has the prescription been filled recently? Yes  Is the patient out of the medication? No  Has the patient been seen for an appointment in the last year OR does the patient have an upcoming appointment? Yes  Can we respond through MyChart? No  Agent: Please be advised that Rx refills may take up to 3 business days. We ask that you follow-up with your pharmacy.

## 2023-11-01 NOTE — Telephone Encounter (Signed)
 Rx has been faxed.

## 2023-11-02 ENCOUNTER — Other Ambulatory Visit: Payer: Self-pay | Admitting: Internal Medicine

## 2023-11-02 DIAGNOSIS — I10 Essential (primary) hypertension: Secondary | ICD-10-CM

## 2023-11-05 ENCOUNTER — Other Ambulatory Visit: Payer: Self-pay | Admitting: *Deleted

## 2023-11-05 ENCOUNTER — Telehealth: Admitting: *Deleted

## 2023-11-05 DIAGNOSIS — I129 Hypertensive chronic kidney disease with stage 1 through stage 4 chronic kidney disease, or unspecified chronic kidney disease: Secondary | ICD-10-CM | POA: Diagnosis not present

## 2023-11-05 DIAGNOSIS — Z008 Encounter for other general examination: Secondary | ICD-10-CM | POA: Diagnosis not present
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
## 2023-11-05 NOTE — Telephone Encounter (Signed)
 Error

## 2023-11-05 NOTE — Patient Instructions (Signed)
 Visit Information  Thank you for taking time to visit with me today. Please don't hesitate to contact me if I can be of assistance to you before our next scheduled appointment.  Your next care management appointment is by telephone on 11/26/23 at 10am   Please call the care guide team at [phone] if you need to cancel, schedule, or reschedule an appointment.   Please call the Suicide and Crisis Lifeline: 988 call the [place]  National Suicide Prevention Lifeline: (848)019-0187 or TTY: 402-784-4120 TTY 303-412-8748) to talk to a trained counselor call 1-800-273-TALK (toll free, 24 hour hotline) if you are experiencing a Mental Health or Behavioral Health Crisis or need someone to talk to.    Karishma Unrein, LCSW [place]  [facility], [facility] Licensed Clinical Social Worker  Direct Dial: [phone]

## 2023-11-06 ENCOUNTER — Telehealth: Payer: Self-pay | Admitting: Radiology

## 2023-11-06 DIAGNOSIS — I161 Hypertensive emergency: Secondary | ICD-10-CM

## 2023-11-06 DIAGNOSIS — I10 Essential (primary) hypertension: Secondary | ICD-10-CM

## 2023-11-06 NOTE — Telephone Encounter (Signed)
 Copied from CRM [id number]. Topic: General - Other >> Nov 06, 2023  2:26 PM [place] J wrote: Reason for CRM:  Jonette calling from [facility] wanting to let Dr Plotnikov's clinical team be aware of her concern. 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 Her lowest was 140. Jonette will be sending over a fax requesting us  to check on her kidneys as well. She mentioned reaching out to cardiology but she would like is we could reach out to the patient directly.   Callback: [phone] or [phone]

## 2023-11-07 MED ORDER — CLONIDINE HCL 0.2 MG PO TABS: 0.2000 mg | ORAL_TABLET | Freq: Three times a day (TID) | ORAL | 3 refills | Status: DC

## 2023-11-07 NOTE — Addendum Note (Signed)
 Addended by: Jetaun Colbath on: 11/07/2023 03:30 PM   Modules accepted: Orders

## 2023-11-07 NOTE — Telephone Encounter (Signed)
 Spoke with patient about PCP notes on increasing Clonidine  to 0.2mg  3 times a day and referral to HTN clinic. Medication changed and sent in.

## 2023-11-07 NOTE — Telephone Encounter (Signed)
 Increase his clonidine  to 0.2 mg 3 times a day if blood pressure is high. I will make a referral to hypertension clinic. Thanks

## 2023-11-07 NOTE — Addendum Note (Signed)
 Addended by: Emmali Karow V on: 11/07/2023 07:41 AM   Modules accepted: Orders

## 2023-11-12 ENCOUNTER — Other Ambulatory Visit: Payer: Self-pay | Admitting: Internal Medicine

## 2023-11-12 NOTE — Telephone Encounter (Unsigned)
 Copied from CRM #[id number]. Topic: Clinical - Medication Refill >> Nov 12, 2023  9:15 AM Ahlexyia S wrote: Medication: pravastatin  (PRAVACHOL ) 40 MG tablet   Has the patient contacted their pharmacy? Yes, pt was advised to contact PCP offices. (Agent: If no, request that the patient contact the pharmacy for the refill. If patient does not wish to contact the pharmacy document the reason why and proceed with request.) (Agent: If yes, when and what did the pharmacy advise?)  This is the patient's preferred pharmacy:  [id number] - [place] - [address] [place] [address] Phone: [phone] Fax: [phone]  Is this the correct pharmacy for this prescription? Yes If no, delete pharmacy and type the correct one.   Has the prescription been filled recently? Yes, Aug 17th for 30 day supply.  Is the patient out of the medication? No, limited supply  Has the patient been seen for an appointment in the last year OR does the patient have an upcoming appointment? Yes  Can we respond through MyChart? No, prefers phone calls.  Agent: Please be advised that Rx refills may take up to 3 business days. We ask that you follow-up with your pharmacy.

## 2023-11-14 MED ORDER — PRAVASTATIN SODIUM 40 MG PO TABS: 40.0000 mg | ORAL_TABLET | Freq: Every day | ORAL | 3 refills | Status: DC

## 2023-11-15 ENCOUNTER — Other Ambulatory Visit: Payer: Self-pay | Admitting: Internal Medicine

## 2023-11-19 ENCOUNTER — Other Ambulatory Visit: Payer: Self-pay

## 2023-11-19 VITALS — BP 167/108

## 2023-11-19 DIAGNOSIS — I10 Essential (primary) hypertension: Secondary | ICD-10-CM

## 2023-11-19 NOTE — Patient Instructions (Signed)
 Visit Information  Thank you for taking time to visit with me today. Please don't hesitate to contact me if I can be of assistance to you before our next scheduled appointment.  Your next care management appointment is by telephone on 12/19/23 at 11:30 am   Please call the care guide team at [phone] if you need to cancel, schedule, or reschedule an appointment.   Please call the Suicide and Crisis Lifeline: 988 call the [place]  National Suicide Prevention Lifeline: 917 303 7813 or TTY: (706)663-2401 TTY 318-244-1282) to talk to a trained counselor call 1-800-273-TALK (toll free, 24 hour hotline) if you are experiencing a Mental Health or Behavioral Health Crisis or need someone to talk to.   Heddy Shutter, RN, MSN, BSN, CCM [place]  [facility], Population Health Case Manager Phone: [phone]
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
## 2023-11-22 ENCOUNTER — Telehealth: Payer: Self-pay | Admitting: Pharmacy Technician

## 2023-11-22 ENCOUNTER — Encounter (HOSPITAL_BASED_OUTPATIENT_CLINIC_OR_DEPARTMENT_OTHER): Payer: Self-pay | Admitting: Internal Medicine

## 2023-11-22 ENCOUNTER — Ambulatory Visit (HOSPITAL_BASED_OUTPATIENT_CLINIC_OR_DEPARTMENT_OTHER): Admitting: Internal Medicine

## 2023-11-22 ENCOUNTER — Other Ambulatory Visit (HOSPITAL_COMMUNITY): Payer: Self-pay

## 2023-11-22 VITALS — BP 140/80 | HR 72 | Resp 17 | Ht 65.0 in | Wt 178.0 lb

## 2023-11-22 DIAGNOSIS — R5383 Other fatigue: Secondary | ICD-10-CM | POA: Diagnosis not present

## 2023-11-22 DIAGNOSIS — Z8673 Personal history of transient ischemic attack (TIA), and cerebral infarction without residual deficits: Secondary | ICD-10-CM | POA: Diagnosis not present

## 2023-11-22 DIAGNOSIS — T466X5A Adverse effect of antihyperlipidemic and antiarteriosclerotic drugs, initial encounter: Secondary | ICD-10-CM

## 2023-11-22 DIAGNOSIS — I10 Essential (primary) hypertension: Secondary | ICD-10-CM

## 2023-11-22 DIAGNOSIS — M791 Myalgia, unspecified site: Secondary | ICD-10-CM

## 2023-11-22 DIAGNOSIS — E785 Hyperlipidemia, unspecified: Secondary | ICD-10-CM

## 2023-11-22 DIAGNOSIS — N1831 Chronic kidney disease, stage 3a: Secondary | ICD-10-CM

## 2023-11-22 DIAGNOSIS — I739 Peripheral vascular disease, unspecified: Secondary | ICD-10-CM | POA: Diagnosis not present

## 2023-11-22 DIAGNOSIS — R0602 Shortness of breath: Secondary | ICD-10-CM | POA: Diagnosis not present

## 2023-11-22 DIAGNOSIS — T466X5D Adverse effect of antihyperlipidemic and antiarteriosclerotic drugs, subsequent encounter: Secondary | ICD-10-CM | POA: Diagnosis not present

## 2023-11-22 DIAGNOSIS — R0989 Other specified symptoms and signs involving the circulatory and respiratory systems: Secondary | ICD-10-CM

## 2023-11-22 DIAGNOSIS — Z79899 Other long term (current) drug therapy: Secondary | ICD-10-CM

## 2023-11-22 MED ORDER — REPATHA SURECLICK 140 MG/ML ~~LOC~~ SOAJ: 140.0000 mg | SUBCUTANEOUS | 6 refills | Status: AC

## 2023-11-22 MED ORDER — AMLODIPINE BESYLATE-VALSARTAN 5-160 MG PO TABS: 1.0000 | ORAL_TABLET | Freq: Two times a day (BID) | ORAL | 1 refills | Status: DC

## 2023-11-22 NOTE — Patient Instructions (Addendum)
 Medication Instructions:   STOP TAKING PRAVASTATIN  NOW  STOP TAKING CLONIDINE  NOW  PLEASE CONTINUE YOUR ZETIA  REGIMEN  INCREASE YOUR AMLODIPINE -VALSARTAN  (EXFORGE ) 5-160 MG TABLET--TAKE ONE TABLET BY MOUTH TWICE DAILY  Dr. Mona recommends REPATHA  (PCSK9). This is an injectable cholesterol medication self-administered once every 14 days. This medication will likely need prior approval with your insurance company, which we will work on. If the medication is not approved initially, we may need to do an appeal with your insurance. If approved, we will provide you with copay and cost information. We'll then send the prescription to your pharmacy. We would have you complete another set of fasting labs between 3-4 months to reassess cholesterol.   REPATHA   is self-injected once every 14 days in subcutaneous or fatty tissue - such as belly or side/outer/upper thigh. It is best stored in the refrigerator but is stable at room temp up to 28 days. Please take the pen-injector out of fridge about 30 minutes - 1 hour prior to injection, to allow it to warm closer to room temperature.   This medication is very effective in lowering LDL and can lower LPa, as Dr. Mona mentioned. It is also generally well tolerated -- most common reaction may be cold-like symptoms such as runny nose, scratchy throat, as this is an antibody therapy. It is generally self-limiting and after a few doses, your body should have normalized to the medication.   Here is a demo video: https://www.repatha .com/how-to-start-repatha -injection   If you need a co-pay card for Repatha : https://www.repatha .com/repatha -cost If you need a co-pay card for Praluent: [email or web address]  Patient Assistance:    These foundations have funds at various times.   The [employer] Foundation: https://www.panfoundation.org/disease-funds/hypercholesterolemia/ -- can sign up for wait list  The [facility] offers assistance to  help pay for medication copays.  They will cover copays for all cholesterol lowering meds, including statins, fibrates, omega-3 fish oils like Vascepa, ezetimibe , Repatha , Praluent, Nexletol, Nexlizet.  The cards are usually good for $2,500 or 12 months, whichever comes first. Our fax # is [phone] (you will need this to apply) Go to healthwellfoundation.org Click on "Apply Now" Answer questions as to whom is applying (patient or representative) Your disease fund will be "hypercholesterolemia - Medicare access" They will ask questions about finances and which medications you are taking for cholesterol When you submit, the approval is usually within minutes.  You will need to print the card information from the site You will need to show this information to your pharmacy, they will bill your Medicare Part D plan first -then bill Health Well --for the copay.   You can also call them at [phone], although the hold times can be quite long.     *If you need a refill on your cardiac medications before your next appointment, please call your pharmacy*    Lab Work:  1.) TODAY---BMET  1.) IN 3 MONTHS HERE AT [facility] ON 3RD FLOOR [address]---NMR LIPOPROFILE AND LIPOPROTEIN A--PLEASE COME FASTING TO THIS LAB APPOINTMENT  If you have labs (blood work) drawn today and your tests are completely normal, you will receive your results only by: MyChart Message (if you have MyChart) OR A paper copy in the mail If you have any lab test that is abnormal or we need to change your treatment, we will call you to review the results.    Testing/Procedures:    Your cardiac CT will be scheduled at one of the below locations:     [facility] [address]  [place], [place] [address] [phone]   If scheduled at the Heart and Vascular Tower at [facility] street, please enter the parking lot using the [address] entrance and use the FREE valet service at the  patient drop-off area. Enter the building and check-in with registration on the main floor.  Please follow these instructions carefully (unless otherwise directed):  An IV will be required for this test and Nitroglycerin  will be given.  Hold all erectile dysfunction medications at least 3 days (72 hrs) prior to test. (Ie viagra, cialis, sildenafil, tadalafil, etc)   On the Night Before the Test: Be sure to Drink plenty of water . Do not consume any caffeinated/decaffeinated beverages or chocolate 12 hours prior to your test. Do not take any antihistamines 12 hours prior to your test.    On the Day of the Test: Drink plenty of water  until 1 hour prior to the test. Do not eat any food 1 hour prior to test. You may take your regular medications prior to the test.  Take metoprolol  SUCCINATE (TOPROL  XL) 100 MG BY MOUTH two hours prior to test. If you take Furosemide /Hydrochlorothiazide/Spironolactone /Chlorthalidone, please HOLD on the morning of the test. Patients who wear a continuous glucose monitor MUST remove the device prior to scanning. FEMALES- please wear underwire-free bra if available, avoid dresses & tight clothing       After the Test: Drink plenty of water . After receiving IV contrast, you may experience a mild flushed feeling. This is normal. On occasion, you may experience a mild rash up to 24 hours after the test. This is not dangerous. If this occurs, you can take Benadryl  25 mg, Zyrtec, Claritin , or Allegra and increase your fluid intake. (Patients taking Tikosyn should avoid Benadryl , and may take Zyrtec, Claritin , or Allegra) If you experience trouble breathing, this can be serious. If it is severe call 911 IMMEDIATELY. If it is mild, please call our office.  We will call to schedule your test 2-4 weeks out understanding that some insurance companies will need an authorization prior to the service being performed.   For more information and frequently asked questions,  please visit our website : [email or web address]  For non-scheduling related questions, please contact the cardiac imaging nurse navigator should you have any questions/concerns: Cardiac Imaging Nurse Navigators Direct Office Dial: [phone]   For scheduling needs, including cancellations and rescheduling, please call [place], [phone].   Follow-Up:  4 MONTHS WITH DR. HILTY AT [address]    Other Instructions  Tips to Measure your Blood Pressure Correctly  To determine whether you have hypertension, a medical professional will take a blood pressure reading. How you prepare for the test, the position of your arm, and other factors can change a blood pressure reading by 10% or more. That could be enough to hide high blood pressure, start you on a drug you don't really need, or lead your doctor to incorrectly adjust your medications.  National and international guidelines offer specific instructions for measuring blood pressure. If a doctor, nurse, or medical assistant isn't doing it right, don't hesitate to ask him or her to get with the guidelines.  Here's what you can do to ensure a correct reading:  Don't drink a caffeinated beverage or smoke during the 30 minutes before the test.  Sit quietly for five minutes before the test begins.  During the measurement, sit in a chair with your feet on the floor and your arm supported so your elbow  is at about heart level.  The inflatable part of the cuff should completely cover at least 80% of your upper arm, and the cuff should be placed on bare skin, not over a shirt.  Don't talk during the measurement.  Have your blood pressure measured twice, with a brief break in between. If the readings are different by 5 points or more, have it done a third time.  In 2017, new guidelines from the American Heart Association, the [employer] of Cardiology, and nine other health organizations lowered the diagnosis of high blood  pressure to 130/80 mm Hg or higher for all adults. The guidelines also redefined the various blood pressure categories to now include normal, elevated, Stage 1 hypertension, Stage 2 hypertension, and hypertensive crisis (see Blood pressure categories).  Blood pressure categories  Blood pressure category SYSTOLIC (upper number)  DIASTOLIC (lower number)  Normal Less than 120 mm Hg and Less than 80 mm Hg  Elevated 120-129 mm Hg and Less than 80 mm Hg  High blood pressure: Stage 1 hypertension 130-139 mm Hg or 80-89 mm Hg  High blood pressure: Stage 2 hypertension 140 mm Hg or higher or 90 mm Hg or higher  Hypertensive crisis (consult your doctor immediately) Higher than 180 mm Hg and/or Higher than 120 mm Hg  Source: American Heart Association and American Stroke Association. For more on getting your blood pressure under control, buy Controlling Your Blood Pressure, a Special Health Report from [facility].   Blood Pressure Log   Date   Time  Blood Pressure  Position  Example: Nov 1 9 AM 124/78 sitting

## 2023-11-22 NOTE — Telephone Encounter (Signed)
 Pt aware of PA approval for Repatha  and script sent to her pharmacy on file.   Pt verbalized understanding and agrees with this plan.

## 2023-11-22 NOTE — Telephone Encounter (Signed)
 Pharmacy Patient Advocate Encounter  Received notification from [facility] that Prior Authorization for repatha  has been APPROVED from 11/22/23 to 11/21/24   PA #/Case ID/Reference #: [id number]

## 2023-11-22 NOTE — Telephone Encounter (Signed)
   Pharmacy Patient Advocate Encounter   Received notification from STAFF that prior authorization for REPATHA  is required/requested.   Insurance verification completed.   The patient is insured through [facility] .   Per test claim: PA required; PA submitted to above mentioned insurance via Latent Key/confirmation #/EOC [id number] Status is pending

## 2023-11-23 LAB — BASIC METABOLIC PANEL WITH GFR
BUN/Creatinine Ratio: 13 (ref 12–28)
BUN: 17 mg/dL (ref 8–27)
CO2: 18 mmol/L — ABNORMAL LOW (ref 20–29)
Calcium: 10.4 mg/dL — ABNORMAL HIGH (ref 8.7–10.3)
Chloride: 105 mmol/L (ref 96–106)
Creatinine, Ser: 1.35 mg/dL — ABNORMAL HIGH (ref 0.57–1.00)
Glucose: 108 mg/dL — ABNORMAL HIGH (ref 70–99)
Potassium: 4.4 mmol/L (ref 3.5–5.2)
Sodium: 144 mmol/L (ref 134–144)
eGFR: 41 mL/min/1.73 — ABNORMAL LOW (ref >59)
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
## 2023-11-25 ENCOUNTER — Ambulatory Visit: Payer: Self-pay | Admitting: Internal Medicine

## 2023-11-25 ENCOUNTER — Telehealth: Payer: Self-pay | Admitting: *Deleted

## 2023-11-25 NOTE — Progress Notes (Unsigned)
 Care Guide Pharmacy Note  11/25/2023 Name: QAMAR ROSMAN MRN: [id number] DOB: Mar 27, 1950  Referred By: Garald Karlynn GAILS, MD Reason for referral: Call Attempt #1 and Complex Care Management (Outreach to schedule referral with pharmacist )   HERO MCCATHERN is a 73 y.o. year old female who is a primary care patient of Plotnikov, Karlynn GAILS, MD.  Merlynn GORMAN Bohr was referred to the pharmacist for assistance related to: HTN  An unsuccessful telephone outreach was attempted today to contact the patient who was referred to the pharmacy team for assistance with medication management. Additional attempts will be made to contact the patient.  Thedford Franks, CMA [place]  [facility], [facility] Guide Direct Dial: [phone]  Fax: [phone] Website: [email or web address]

## 2023-11-26 ENCOUNTER — Other Ambulatory Visit: Payer: Self-pay | Admitting: *Deleted

## 2023-11-26 ENCOUNTER — Encounter (HOSPITAL_BASED_OUTPATIENT_CLINIC_OR_DEPARTMENT_OTHER): Payer: Self-pay

## 2023-11-26 NOTE — Patient Instructions (Signed)
 Visit Information  Thank you for taking time to visit with me today. Please don't hesitate to contact me if I can be of assistance to you before our next scheduled appointment.  Your next care management appointment is by telephone on 12/16/23 at 1pm    Please call the care guide team at [phone] if you need to cancel, schedule, or reschedule an appointment.   Please call the Suicide and Crisis Lifeline: 988 call the [place]  National Suicide Prevention Lifeline: 2722274549 or TTY: 616-094-0137 TTY 530-506-3320) to talk to a trained counselor call 1-800-273-TALK (toll free, 24 hour hotline) call 911 if you are experiencing a Mental Health or Behavioral Health Crisis or need someone to talk to.  Daffney Greenly, LCSW [place]  [facility], [facility] Licensed Clinical Social Worker  Direct Dial: [phone]
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
## 2023-11-27 ENCOUNTER — Other Ambulatory Visit (HOSPITAL_COMMUNITY): Payer: Self-pay

## 2023-11-27 ENCOUNTER — Telehealth (HOSPITAL_BASED_OUTPATIENT_CLINIC_OR_DEPARTMENT_OTHER): Payer: Self-pay | Admitting: *Deleted

## 2023-11-27 ENCOUNTER — Telehealth: Payer: Self-pay | Admitting: Pharmacy Technician

## 2023-11-27 ENCOUNTER — Encounter (HOSPITAL_COMMUNITY): Payer: Self-pay

## 2023-11-27 NOTE — Progress Notes (Signed)
 Care Guide Pharmacy Note  11/27/2023 Name: Judy Carlson MRN: [id number] DOB: October 13, 1950  Referred By: Garald Karlynn GAILS, MD Reason for referral: Call Attempt #1 and Complex Care Management (Outreach to schedule referral with pharmacist )   [name] is a 73 y.o. year old female who is a primary care patient of Plotnikov, Aleksei V, MD.  Merlynn GORMAN Bohr was referred to the pharmacist for assistance related to: HTN  Successful contact was made with the patient to discuss pharmacy services.  Patient declines engagement at this time. Contact information was provided to the patient should they wish to reach out for assistance at a later time.Pt declined pharmacist at this time - says she already has enough appts and has THN appt with [facility]   Thedford Franks, CMA [facility]  [facility], [facility] Guide Direct Dial: [phone]  Fax: [phone] Website: [email or web address]

## 2023-11-27 NOTE — Telephone Encounter (Signed)
  EXFORGE  5/160MG  1 BID AMLODIPINE /VALSARTAN  Pharmacy Patient Advocate Encounter   Received notification from Pt Calls Messages that prior authorization for AMLODIPINE /VALSARTAN  is required/requested.   Insurance verification completed.   The patient is insured through [employer] .   Per test claim: PA required; PA submitted to above mentioned insurance via Latent Key/confirmation #/EOC [id number] Status is pending

## 2023-11-27 NOTE — Telephone Encounter (Signed)
 Pharmacy Patient Advocate Encounter  Received notification from [employer] [facility] that Prior Authorization for AMLODIPINE /VALSARTAN  has been APPROVED from 08/29/23 to 11/26/24   PA #/Case ID/Reference #: [id number]

## 2023-11-27 NOTE — Telephone Encounter (Signed)
 Received message from patient needs PA for Exforge  at twice a day   Will forward to PA team for review

## 2023-11-28 ENCOUNTER — Telehealth: Payer: Self-pay

## 2023-11-28 ENCOUNTER — Encounter (HOSPITAL_BASED_OUTPATIENT_CLINIC_OR_DEPARTMENT_OTHER): Payer: Self-pay

## 2023-11-28 ENCOUNTER — Telehealth (HOSPITAL_BASED_OUTPATIENT_CLINIC_OR_DEPARTMENT_OTHER): Payer: Self-pay | Admitting: Internal Medicine

## 2023-11-28 ENCOUNTER — Other Ambulatory Visit (HOSPITAL_COMMUNITY): Payer: Self-pay

## 2023-11-28 ENCOUNTER — Ambulatory Visit (HOSPITAL_BASED_OUTPATIENT_CLINIC_OR_DEPARTMENT_OTHER): Admitting: Family

## 2023-11-28 ENCOUNTER — Encounter (HOSPITAL_BASED_OUTPATIENT_CLINIC_OR_DEPARTMENT_OTHER): Payer: Self-pay | Admitting: Family

## 2023-11-28 VITALS — BP 128/76 | HR 85 | Ht 65.0 in | Wt 178.6 lb

## 2023-11-28 DIAGNOSIS — R0989 Other specified symptoms and signs involving the circulatory and respiratory systems: Secondary | ICD-10-CM | POA: Diagnosis not present

## 2023-11-28 DIAGNOSIS — R0602 Shortness of breath: Secondary | ICD-10-CM

## 2023-11-28 DIAGNOSIS — I25118 Atherosclerotic heart disease of native coronary artery with other forms of angina pectoris: Secondary | ICD-10-CM | POA: Diagnosis not present

## 2023-11-28 DIAGNOSIS — E785 Hyperlipidemia, unspecified: Secondary | ICD-10-CM

## 2023-11-28 MED ORDER — REPATHA SURECLICK 140 MG/ML ~~LOC~~ SOAJ: 140.0000 mg | SUBCUTANEOUS | 0 refills | Status: DC
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
## 2023-11-28 NOTE — Telephone Encounter (Signed)
 Spoke with patient and stated she had eaten and is feeling better Advised patient added CBC to lab work will have tomorrow after CT

## 2023-11-28 NOTE — Telephone Encounter (Signed)
 Will send the pt a mychart message making her aware of [employer] grant approval with BIN, PCN, Group, ID number information included.   Will also place a call to her with this information.  Pt verbalized understanding and was gracious for the call back.

## 2023-11-28 NOTE — Telephone Encounter (Signed)
 Received call from [facility] Patient went upstairs to get lab done.  She was in chair and was feeling poorly BS 128 blood pressure 117/60, nausea and shaky Had not eaten since last night  Went upstairs to get patient and bring her back down O2 sats 99 HR 74  Patient ate some peanut butter crackers and had a drink Felt much better and felt she could drive home  Does have intermittent dizzy/lightheadedness when she goes from sitting to standing or getting out of the car.   Discussed with Reche ORN NP and will have patient proceed with Cardiac CT as scheduled  Walked patient to car and she felt fine until she bent over in the car and became a little dizzy again  Did advise to stay hydrated, make sure she is eating, and position changes slowly   Advised patient would call he shortly to check on her

## 2023-11-28 NOTE — Telephone Encounter (Signed)
 Patient Advocate Encounter   The patient was approved for a [employer] grant that will help cover the cost of REPATHA  Total amount awarded, $2,500.  Effective: 10/29/23 - 10/27/24   APW:[id number] ERW:[id number] Hmnle:[id number] PI:[id number]   Pharmacy provided with approval and processing information.   Ileana Lehmann, CPhT  Pharmacy Patient Advocate Specialist  Direct Number: [phone] Fax: [phone]

## 2023-11-28 NOTE — Telephone Encounter (Signed)
 Discuss with Reche ORN NP and will add CBC to her labs tomorrow with h/o of anemia

## 2023-11-28 NOTE — Telephone Encounter (Signed)
 Caller [facility]) stated patient is in lab and needs to consult.

## 2023-11-28 NOTE — Telephone Encounter (Signed)
 Appreciate nursing team care and attention to detail. Her symptoms are consistent with orthostatic hypotension and precautions were reviewed.   Arron Tetrault S Annissa Andreoni, NP

## 2023-11-28 NOTE — Patient Instructions (Signed)
 Medication Instructions:   Your physician recommends that you continue on your current medications as directed. Please refer to the Current Medication list given to you today.    Labwork:  TODAY--3RD FLOOR [address] AT [facility]--BMET AND RENIN-ALDOSTERONE    Testing/Procedures:  Your physician has requested that you have a renal artery duplex. During this test, an ultrasound is used to evaluate blood flow to the kidneys. Allow one hour for this exam. Do not eat after midnight the day before and avoid carbonated beverages. Take your medications as you usually do.     Follow-Up: Please follow up in _2-3_ months in ADV HTN CLINIC with Dr. Raford, Reche Finder, NP or Allean Mink PharmD

## 2023-11-28 NOTE — Telephone Encounter (Signed)
-----   Message from Nurse Porter HERO sent at 11/28/2023 10:58 AM EDT ----- Regarding: BETHENE FERRETTI FOR REPATHA  Reche Finder,  NP wanted me to reach out to you guys about trying for the [facility] for Repatha  for this pt  Can you help with this and let us  know?   Thanks, [employer]

## 2023-11-29 ENCOUNTER — Other Ambulatory Visit: Payer: Self-pay | Admitting: Cardiology

## 2023-11-29 ENCOUNTER — Other Ambulatory Visit (HOSPITAL_COMMUNITY): Payer: Self-pay

## 2023-11-29 ENCOUNTER — Ambulatory Visit: Payer: Self-pay | Admitting: Internal Medicine

## 2023-11-29 ENCOUNTER — Ambulatory Visit (HOSPITAL_COMMUNITY)
Admission: RE | Admit: 2023-11-29 | Discharge: 2023-11-29 | Disposition: A | Discharge status: Home / Self Care | Source: Ambulatory Visit | Attending: Internal Medicine | Admitting: Internal Medicine

## 2023-11-29 ENCOUNTER — Ambulatory Visit (HOSPITAL_BASED_OUTPATIENT_CLINIC_OR_DEPARTMENT_OTHER)
Admission: RE | Admit: 2023-11-29 | Discharge: 2023-11-29 | Disposition: A | Discharge status: Home / Self Care | Source: Ambulatory Visit | Attending: Cardiology | Admitting: Cardiology

## 2023-11-29 DIAGNOSIS — I2584 Coronary atherosclerosis due to calcified coronary lesion: Secondary | ICD-10-CM

## 2023-11-29 DIAGNOSIS — R5383 Other fatigue: Secondary | ICD-10-CM | POA: Diagnosis not present

## 2023-11-29 DIAGNOSIS — R0602 Shortness of breath: Secondary | ICD-10-CM | POA: Insufficient documentation

## 2023-11-29 DIAGNOSIS — E785 Hyperlipidemia, unspecified: Secondary | ICD-10-CM | POA: Insufficient documentation

## 2023-11-29 DIAGNOSIS — R931 Abnormal findings on diagnostic imaging of heart and coronary circulation: Secondary | ICD-10-CM | POA: Insufficient documentation

## 2023-11-29 DIAGNOSIS — I358 Other nonrheumatic aortic valve disorders: Secondary | ICD-10-CM | POA: Insufficient documentation

## 2023-11-29 DIAGNOSIS — I700 Atherosclerosis of aorta: Secondary | ICD-10-CM | POA: Insufficient documentation

## 2023-11-29 DIAGNOSIS — I2510 Atherosclerotic heart disease of native coronary artery without angina pectoris: Secondary | ICD-10-CM | POA: Diagnosis not present

## 2023-11-29 DIAGNOSIS — R6889 Other general symptoms and signs: Secondary | ICD-10-CM

## 2023-11-29 MED ORDER — IOHEXOL 350 MG/ML SOLN
100.0000 mL | Freq: Once | INTRAVENOUS | Status: AC | PRN
  Administered 2023-11-29: 100 mL via INTRAVENOUS

## 2023-11-29 MED ORDER — NITROGLYCERIN 0.4 MG SL SUBL
0.8000 mg | SUBLINGUAL_TABLET | Freq: Once | SUBLINGUAL | Status: AC
  Administered 2023-11-29: 0.8 mg via SUBLINGUAL

## 2023-11-29 NOTE — Addendum Note (Signed)
 Addended by: Laquentin Loudermilk S on: 11/29/2023 02:30 PM   Modules accepted: Orders

## 2023-12-06 LAB — CBC WITH DIFFERENTIAL/PLATELET
Basophils Absolute: 0.1 x10E3/uL (ref 0.0–0.2)
Basos: 1 %
EOS (ABSOLUTE): 0.1 x10E3/uL (ref 0.0–0.4)
Eos: 2 %
Hematocrit: 23.6 % — ABNORMAL LOW (ref 34.0–46.6)
Hemoglobin: 7 g/dL — ABNORMAL LOW (ref 11.1–15.9)
Immature Grans (Abs): 0 x10E3/uL (ref 0.0–0.1)
Immature Granulocytes: 0 %
Lymphocytes Absolute: 1.2 x10E3/uL (ref 0.7–3.1)
Lymphs: 24 %
MCH: 21.8 pg — ABNORMAL LOW (ref 26.6–33.0)
MCHC: 29.7 g/dL — ABNORMAL LOW (ref 31.5–35.7)
MCV: 74 fL — ABNORMAL LOW (ref 79–97)
Monocytes Absolute: 0.7 x10E3/uL (ref 0.1–0.9)
Monocytes: 14 %
Neutrophils Absolute: 2.9 x10E3/uL (ref 1.4–7.0)
Neutrophils: 59 %
Platelets: 457 x10E3/uL — ABNORMAL HIGH (ref 150–450)
RBC: 3.21 x10E6/uL — ABNORMAL LOW (ref 3.77–5.28)
RDW: 18 % — ABNORMAL HIGH (ref 11.7–15.4)
WBC: 4.9 x10E3/uL (ref 3.4–10.8)

## 2023-12-06 LAB — BASIC METABOLIC PANEL WITH GFR
BUN/Creatinine Ratio: 13 (ref 12–28)
BUN: 17 mg/dL (ref 8–27)
CO2: 18 mmol/L — ABNORMAL LOW (ref 20–29)
Calcium: 9.4 mg/dL (ref 8.7–10.3)
Chloride: 102 mmol/L (ref 96–106)
Creatinine, Ser: 1.31 mg/dL — ABNORMAL HIGH (ref 0.57–1.00)
Glucose: 91 mg/dL (ref 70–99)
Potassium: 4.2 mmol/L (ref 3.5–5.2)
Sodium: 138 mmol/L (ref 134–144)
eGFR: 43 mL/min/1.73 — ABNORMAL LOW (ref >59)

## 2023-12-06 LAB — ALDOSTERONE + RENIN ACTIVITY W/ RATIO
Aldos/Renin Ratio: 4 (ref 0.0–30.0)
Aldosterone: 6.6 ng/dL (ref 0.0–30.0)
Renin Activity, Plasma: 1.646 ng/mL/h (ref 0.167–5.380)

## 2023-12-09 ENCOUNTER — Other Ambulatory Visit: Payer: Self-pay

## 2023-12-09 ENCOUNTER — Inpatient Hospital Stay (HOSPITAL_COMMUNITY)
Admission: EM | Admit: 2023-12-09 | Discharge: 2023-12-12 | DRG: 377 | Disposition: A | Discharge status: Home / Self Care | Source: Ambulatory Visit | Attending: Internal Medicine | Admitting: Internal Medicine

## 2023-12-09 ENCOUNTER — Ambulatory Visit (HOSPITAL_BASED_OUTPATIENT_CLINIC_OR_DEPARTMENT_OTHER): Payer: Self-pay | Admitting: Family

## 2023-12-09 ENCOUNTER — Observation Stay (HOSPITAL_COMMUNITY)

## 2023-12-09 ENCOUNTER — Encounter (HOSPITAL_COMMUNITY): Payer: Self-pay

## 2023-12-09 DIAGNOSIS — Z8249 Family history of ischemic heart disease and other diseases of the circulatory system: Secondary | ICD-10-CM

## 2023-12-09 DIAGNOSIS — Z7902 Long term (current) use of antithrombotics/antiplatelets: Secondary | ICD-10-CM

## 2023-12-09 DIAGNOSIS — K922 Gastrointestinal hemorrhage, unspecified: Secondary | ICD-10-CM | POA: Diagnosis not present

## 2023-12-09 DIAGNOSIS — D62 Acute posthemorrhagic anemia: Secondary | ICD-10-CM | POA: Diagnosis present

## 2023-12-09 DIAGNOSIS — Z818 Family history of other mental and behavioral disorders: Secondary | ICD-10-CM

## 2023-12-09 DIAGNOSIS — K921 Melena: Secondary | ICD-10-CM

## 2023-12-09 DIAGNOSIS — D649 Anemia, unspecified: Secondary | ICD-10-CM | POA: Diagnosis not present

## 2023-12-09 DIAGNOSIS — Z888 Allergy status to other drugs, medicaments and biological substances status: Secondary | ICD-10-CM

## 2023-12-09 DIAGNOSIS — Z880 Allergy status to penicillin: Secondary | ICD-10-CM

## 2023-12-09 DIAGNOSIS — Z83438 Family history of other disorder of lipoprotein metabolism and other lipidemia: Secondary | ICD-10-CM

## 2023-12-09 DIAGNOSIS — Z8673 Personal history of transient ischemic attack (TIA), and cerebral infarction without residual deficits: Secondary | ICD-10-CM

## 2023-12-09 DIAGNOSIS — I6521 Occlusion and stenosis of right carotid artery: Secondary | ICD-10-CM | POA: Diagnosis present

## 2023-12-09 DIAGNOSIS — R0602 Shortness of breath: Secondary | ICD-10-CM | POA: Diagnosis not present

## 2023-12-09 DIAGNOSIS — Z87891 Personal history of nicotine dependence: Secondary | ICD-10-CM

## 2023-12-09 DIAGNOSIS — D509 Iron deficiency anemia, unspecified: Secondary | ICD-10-CM | POA: Diagnosis not present

## 2023-12-09 DIAGNOSIS — K31819 Angiodysplasia of stomach and duodenum without bleeding: Secondary | ICD-10-CM | POA: Diagnosis not present

## 2023-12-09 DIAGNOSIS — F32A Depression, unspecified: Secondary | ICD-10-CM | POA: Diagnosis present

## 2023-12-09 DIAGNOSIS — Z91410 Personal history of adult physical and sexual abuse: Secondary | ICD-10-CM

## 2023-12-09 DIAGNOSIS — K31811 Angiodysplasia of stomach and duodenum with bleeding: Principal | ICD-10-CM | POA: Diagnosis present

## 2023-12-09 DIAGNOSIS — Z886 Allergy status to analgesic agent status: Secondary | ICD-10-CM

## 2023-12-09 DIAGNOSIS — J690 Pneumonitis due to inhalation of food and vomit: Secondary | ICD-10-CM | POA: Diagnosis not present

## 2023-12-09 DIAGNOSIS — K5521 Angiodysplasia of colon with hemorrhage: Secondary | ICD-10-CM

## 2023-12-09 DIAGNOSIS — Z825 Family history of asthma and other chronic lower respiratory diseases: Secondary | ICD-10-CM

## 2023-12-09 DIAGNOSIS — Z860100 Personal history of colon polyps, unspecified: Secondary | ICD-10-CM

## 2023-12-09 DIAGNOSIS — Z90710 Acquired absence of both cervix and uterus: Secondary | ICD-10-CM

## 2023-12-09 DIAGNOSIS — K449 Diaphragmatic hernia without obstruction or gangrene: Secondary | ICD-10-CM | POA: Diagnosis present

## 2023-12-09 DIAGNOSIS — N179 Acute kidney failure, unspecified: Secondary | ICD-10-CM | POA: Diagnosis present

## 2023-12-09 DIAGNOSIS — M2550 Pain in unspecified joint: Secondary | ICD-10-CM | POA: Diagnosis present

## 2023-12-09 DIAGNOSIS — N1831 Chronic kidney disease, stage 3a: Secondary | ICD-10-CM | POA: Diagnosis present

## 2023-12-09 DIAGNOSIS — F419 Anxiety disorder, unspecified: Secondary | ICD-10-CM | POA: Diagnosis present

## 2023-12-09 DIAGNOSIS — E611 Iron deficiency: Secondary | ICD-10-CM

## 2023-12-09 DIAGNOSIS — Z881 Allergy status to other antibiotic agents status: Secondary | ICD-10-CM

## 2023-12-09 DIAGNOSIS — R55 Syncope and collapse: Secondary | ICD-10-CM | POA: Diagnosis present

## 2023-12-09 DIAGNOSIS — J449 Chronic obstructive pulmonary disease, unspecified: Secondary | ICD-10-CM | POA: Diagnosis present

## 2023-12-09 DIAGNOSIS — R112 Nausea with vomiting, unspecified: Secondary | ICD-10-CM | POA: Diagnosis present

## 2023-12-09 DIAGNOSIS — H5461 Unqualified visual loss, right eye, normal vision left eye: Secondary | ICD-10-CM | POA: Diagnosis present

## 2023-12-09 DIAGNOSIS — Q2730 Arteriovenous malformation, site unspecified: Secondary | ICD-10-CM

## 2023-12-09 DIAGNOSIS — I10 Essential (primary) hypertension: Secondary | ICD-10-CM | POA: Diagnosis present

## 2023-12-09 DIAGNOSIS — F439 Reaction to severe stress, unspecified: Secondary | ICD-10-CM | POA: Diagnosis not present

## 2023-12-09 DIAGNOSIS — Z66 Do not resuscitate: Secondary | ICD-10-CM | POA: Diagnosis present

## 2023-12-09 DIAGNOSIS — Z7982 Long term (current) use of aspirin: Secondary | ICD-10-CM

## 2023-12-09 DIAGNOSIS — B9681 Helicobacter pylori [H. pylori] as the cause of diseases classified elsewhere: Secondary | ICD-10-CM | POA: Diagnosis present

## 2023-12-09 DIAGNOSIS — I129 Hypertensive chronic kidney disease with stage 1 through stage 4 chronic kidney disease, or unspecified chronic kidney disease: Secondary | ICD-10-CM | POA: Diagnosis present

## 2023-12-09 DIAGNOSIS — Z9841 Cataract extraction status, right eye: Secondary | ICD-10-CM

## 2023-12-09 DIAGNOSIS — R5383 Other fatigue: Secondary | ICD-10-CM

## 2023-12-09 DIAGNOSIS — D75839 Thrombocytosis, unspecified: Secondary | ICD-10-CM | POA: Diagnosis not present

## 2023-12-09 DIAGNOSIS — Z833 Family history of diabetes mellitus: Secondary | ICD-10-CM

## 2023-12-09 DIAGNOSIS — E785 Hyperlipidemia, unspecified: Secondary | ICD-10-CM | POA: Diagnosis present

## 2023-12-09 DIAGNOSIS — I2510 Atherosclerotic heart disease of native coronary artery without angina pectoris: Secondary | ICD-10-CM | POA: Diagnosis present

## 2023-12-09 DIAGNOSIS — Z79899 Other long term (current) drug therapy: Secondary | ICD-10-CM

## 2023-12-09 LAB — FOLATE: Folate: >20 ng/mL (ref >5.9)

## 2023-12-09 LAB — PREPARE RBC (CROSSMATCH)

## 2023-12-09 LAB — IRON AND TIBC
Iron: 18 ug/dL — ABNORMAL LOW (ref 28–170)
Saturation Ratios: 4 % — ABNORMAL LOW (ref 10.4–31.8)
TIBC: 438 ug/dL (ref 250–450)
UIBC: 420 ug/dL

## 2023-12-09 LAB — CBC
HCT: 24.7 % — ABNORMAL LOW (ref 36.0–46.0)
HCT: 21.6 % — ABNORMAL LOW (ref 36.0–46.0)
Hemoglobin: 7.2 g/dL — ABNORMAL LOW (ref 12.0–15.0)
Hemoglobin: 6.4 g/dL — CL (ref 12.0–15.0)
MCH: 20.6 pg — ABNORMAL LOW (ref 26.0–34.0)
MCH: 20.7 pg — ABNORMAL LOW (ref 26.0–34.0)
MCHC: 29.1 g/dL — ABNORMAL LOW (ref 30.0–36.0)
MCHC: 29.6 g/dL — ABNORMAL LOW (ref 30.0–36.0)
MCV: 70.6 fL — ABNORMAL LOW (ref 80.0–100.0)
MCV: 69.9 fL — ABNORMAL LOW (ref 80.0–100.0)
Platelets: 420 K/uL — ABNORMAL HIGH (ref 150–400)
Platelets: 380 K/uL (ref 150–400)
RBC: 3.5 MIL/uL — ABNORMAL LOW (ref 3.87–5.11)
RBC: 3.09 MIL/uL — ABNORMAL LOW (ref 3.87–5.11)
RDW: 20.4 % — ABNORMAL HIGH (ref 11.5–15.5)
RDW: 19.8 % — ABNORMAL HIGH (ref 11.5–15.5)
WBC: 7.2 K/uL (ref 4.0–10.5)
WBC: 7.6 K/uL (ref 4.0–10.5)
nRBC: 0 % (ref 0.0–0.2)
nRBC: 0.3 % — ABNORMAL HIGH (ref 0.0–0.2)

## 2023-12-09 LAB — COMPREHENSIVE METABOLIC PANEL WITH GFR
ALT: 20 U/L (ref 0–44)
AST: 22 U/L (ref 15–41)
Albumin: 3.7 g/dL (ref 3.5–5.0)
Alkaline Phosphatase: 72 U/L (ref 38–126)
Anion gap: 10 (ref 5–15)
BUN: 19 mg/dL (ref 8–23)
CO2: 19 mmol/L — ABNORMAL LOW (ref 22–32)
Calcium: 9.5 mg/dL (ref 8.9–10.3)
Chloride: 111 mmol/L (ref 98–111)
Creatinine, Ser: 1.35 mg/dL — ABNORMAL HIGH (ref 0.44–1.00)
GFR, Estimated: 41 mL/min — ABNORMAL LOW (ref >60)
Glucose, Bld: 157 mg/dL — ABNORMAL HIGH (ref 70–99)
Potassium: 4 mmol/L (ref 3.5–5.1)
Sodium: 140 mmol/L (ref 135–145)
Total Bilirubin: 0.5 mg/dL (ref 0.0–1.2)
Total Protein: 7.1 g/dL (ref 6.5–8.1)

## 2023-12-09 LAB — PROTIME-INR
INR: 1 (ref 0.8–1.2)
INR: 1 (ref 0.8–1.2)
Prothrombin Time: 13.3 s (ref 11.4–15.2)
Prothrombin Time: 14.2 s (ref 11.4–15.2)

## 2023-12-09 LAB — FERRITIN: Ferritin: 4 ng/mL — ABNORMAL LOW (ref 11–307)

## 2023-12-09 LAB — RETICULOCYTES
Immature Retic Fract: 32.9 % — ABNORMAL HIGH (ref 2.3–15.9)
RBC.: 3.06 MIL/uL — ABNORMAL LOW (ref 3.87–5.11)
Retic Count, Absolute: 74.4 K/uL (ref 19.0–186.0)
Retic Ct Pct: 2.4 % (ref 0.4–3.1)

## 2023-12-09 LAB — VITAMIN B12: Vitamin B-12: 517 pg/mL (ref 180–914)

## 2023-12-09 MED ORDER — DEXTROSE-SODIUM CHLORIDE 5-0.9 % IV SOLN: INTRAVENOUS | Status: AC

## 2023-12-09 MED ORDER — PANTOPRAZOLE SODIUM 40 MG IV SOLR
40.0000 mg | Freq: Once | INTRAVENOUS | Status: AC
  Administered 2023-12-09: 40 mg via INTRAVENOUS
  Filled 2023-12-09: qty 10

## 2023-12-09 MED ORDER — SODIUM CHLORIDE 0.9% IV SOLUTION: Freq: Once | INTRAVENOUS | Status: DC

## 2023-12-09 MED ORDER — ACETAMINOPHEN 325 MG PO TABS
650.0000 mg | ORAL_TABLET | Freq: Once | ORAL | Status: DC
  Filled 2023-12-09: qty 2

## 2023-12-09 MED ORDER — SODIUM CHLORIDE 0.9 % IV BOLUS
1000.0000 mL | Freq: Once | INTRAVENOUS | Status: AC
  Administered 2023-12-09: 1000 mL via INTRAVENOUS

## 2023-12-09 MED ORDER — SODIUM CHLORIDE 0.9% IV SOLUTION
Freq: Once | INTRAVENOUS | Status: DC
Start: 2023-12-09 — End: 2023-12-09

## 2023-12-09 MED ORDER — ONDANSETRON HCL 4 MG/2ML IJ SOLN
4.0000 mg | Freq: Four times a day (QID) | INTRAMUSCULAR | Status: DC | PRN
  Administered 2023-12-09: 4 mg via INTRAVENOUS
  Filled 2023-12-09: qty 2

## 2023-12-09 MED ORDER — PROCHLORPERAZINE EDISYLATE 10 MG/2ML IJ SOLN: 10.0000 mg | Freq: Four times a day (QID) | INTRAMUSCULAR | Status: DC | PRN

## 2023-12-09 MED ORDER — ACETAMINOPHEN 325 MG PO TABS
650.0000 mg | ORAL_TABLET | Freq: Four times a day (QID) | ORAL | Status: DC | PRN
  Administered 2023-12-11: 650 mg via ORAL
  Filled 2023-12-09: qty 2

## 2023-12-09 MED ORDER — PANTOPRAZOLE SODIUM 40 MG IV SOLR
40.0000 mg | Freq: Two times a day (BID) | INTRAVENOUS | Status: DC
  Administered 2023-12-10 – 2023-12-12 (×4): 40 mg via INTRAVENOUS
  Filled 2023-12-09 (×4): qty 10

## 2023-12-09 MED ORDER — ACETAMINOPHEN 650 MG RE SUPP: 650.0000 mg | Freq: Four times a day (QID) | RECTAL | Status: DC | PRN

## 2023-12-09 MED ORDER — PANTOPRAZOLE SODIUM 40 MG IV SOLR: 40.0000 mg | Freq: Two times a day (BID) | INTRAVENOUS | Status: DC

## 2023-12-09 NOTE — Significant Event (Signed)
 Patient's hemoglobin has further declined to 6.4.  I discussed with patient about the need for transfusion after explanation about the adverse effects.  Patient consented to blood transfusion.  Also witnessed by patient's nurse Ms. Neda.  Redia Cleaver MD

## 2023-12-09 NOTE — ED Provider Triage Note (Signed)
 Emergency Medicine Provider Triage Evaluation Note  Judy Carlson , a 73 y.o. female  was evaluated in triage.  Pt complains of black tarry stools over the last month, it was at her cardiology appointment and was initially to be referred for lab work but which she related that she has been having generalized weakness, shortness of breath, exacerbated with movement, she was referred to ED for further evaluation..  Review of Systems  Positive: As above Negative:   Physical Exam  BP 108/70   Pulse 98   Temp 98.9 F (37.2 C)   Resp 18   Ht 5' 5 (1.651 m)   Wt 80.3 kg   SpO2 100%   BMI 29.45 kg/m  Gen:   Awake, no distress   Resp:  Normal effort  MSK:   Moves extremities without difficulty  Other:  Bilateral conjunctive are pale  Medical Decision Making  Medically screening exam initiated at 2:55 PM.  Appropriate orders placed.  Judy Carlson was informed that the remainder of the evaluation will be completed by another provider, this initial triage assessment does not replace that evaluation, and the importance of remaining in the ED until their evaluation is complete.  Labs ordered prior to assessment, resulted, hemoglobin is 7.2, creatinine is elevated 1.35 however this is baseline for the patient.  Type and screen already ordered.   Judy Carlson, Judy Carlson 12/09/23 [phone]
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
## 2023-12-09 NOTE — Telephone Encounter (Signed)
 Patient returned my call.   Reviewed results w her.  She updated the following:  1.) this past Saturday (2 days ago), she blacked out in the presence of her niece.  When she came to - she threw up and then had to move her bowels.  After that she was exhaused, laid down and slept.  Felt better when she woke up.  During the morning that day, before blacking out - she had a fullness above my belly in my abdomen.   2.) she has been have dark/black stools.  Started to notice these after Dr. Mona took her off pravastatin  on 11/22/23  3.) concerned about low BP - states it was 106 before she took the second dose of EXFORGE  on Saturday before blacking out.  She said sometimes in the mornings it is >110/  other times it is only 94/ --concerns her because she is not taking the clonidine  and we had been treating her for high readings.  We discussed how due to low blood counts pressure would be lower and meds may need to be held or adjusted for the short term.  She does not feel bad enough to come into the ER at this time.  I placed an order for stat CBC and she will go into [facility] lab for this today.  Adv of possible ER visit anyway depending on results.  She voices understanding.  She is established w GI and has seen Harlene Mail.  I will route this note to her and back to Reche Castor, NP for further recommendations.
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
## 2023-12-09 NOTE — Plan of Care (Signed)

## 2023-12-09 NOTE — ED Triage Notes (Signed)
 Patient also reports black, tarry stools x 1 month. Patient reports she has been taking her protonix  as prescribed.

## 2023-12-09 NOTE — Telephone Encounter (Signed)
 Called patient back to give the ER recommendation.  The patient reports that she is on the way now and will go to [facility].  She said after eating a short while ago, she got light headed, very fatigued and threw up again.
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
## 2023-12-09 NOTE — ED Triage Notes (Signed)
 Patient states she has been dizzy, shob, nauseous and vomiting since this morning. Cardiology referred her over to be evaluated. Denies chest pain.
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
## 2023-12-09 NOTE — Telephone Encounter (Signed)
 Given evidence of syncope, dark/tarry stool would recommend ED evaluation. Her hypotension suggests that her anemia may be worsening as symptomatic anemia can lead to low blood pressure. Reduce Amlodipine -Valsartan  to once daily instead of BID.   Given nature of symptoms, would advise ED evaluation for treatment of anemia.   Demont Linford S Yuchen Fedor, NP

## 2023-12-10 ENCOUNTER — Observation Stay (HOSPITAL_COMMUNITY)

## 2023-12-10 ENCOUNTER — Encounter (HOSPITAL_COMMUNITY)
Admission: EM | Disposition: A | Discharge status: Home Health | Payer: Self-pay | Source: Ambulatory Visit | Attending: Internal Medicine

## 2023-12-10 ENCOUNTER — Inpatient Hospital Stay (HOSPITAL_COMMUNITY)

## 2023-12-10 ENCOUNTER — Encounter (HOSPITAL_COMMUNITY): Payer: Self-pay | Admitting: Gastroenterology

## 2023-12-10 DIAGNOSIS — K5521 Angiodysplasia of colon with hemorrhage: Secondary | ICD-10-CM | POA: Diagnosis not present

## 2023-12-10 DIAGNOSIS — D75839 Thrombocytosis, unspecified: Secondary | ICD-10-CM | POA: Diagnosis not present

## 2023-12-10 DIAGNOSIS — Z8249 Family history of ischemic heart disease and other diseases of the circulatory system: Secondary | ICD-10-CM | POA: Diagnosis not present

## 2023-12-10 DIAGNOSIS — E785 Hyperlipidemia, unspecified: Secondary | ICD-10-CM | POA: Diagnosis not present

## 2023-12-10 DIAGNOSIS — N183 Chronic kidney disease, stage 3 unspecified: Secondary | ICD-10-CM | POA: Diagnosis not present

## 2023-12-10 DIAGNOSIS — K31811 Angiodysplasia of stomach and duodenum with bleeding: Secondary | ICD-10-CM | POA: Diagnosis not present

## 2023-12-10 DIAGNOSIS — Q2730 Arteriovenous malformation, site unspecified: Secondary | ICD-10-CM

## 2023-12-10 DIAGNOSIS — K31819 Angiodysplasia of stomach and duodenum without bleeding: Secondary | ICD-10-CM

## 2023-12-10 DIAGNOSIS — N179 Acute kidney failure, unspecified: Secondary | ICD-10-CM | POA: Diagnosis not present

## 2023-12-10 DIAGNOSIS — D72819 Decreased white blood cell count, unspecified: Secondary | ICD-10-CM | POA: Diagnosis not present

## 2023-12-10 DIAGNOSIS — F32A Depression, unspecified: Secondary | ICD-10-CM | POA: Diagnosis not present

## 2023-12-10 DIAGNOSIS — Z7982 Long term (current) use of aspirin: Secondary | ICD-10-CM | POA: Diagnosis not present

## 2023-12-10 DIAGNOSIS — I2510 Atherosclerotic heart disease of native coronary artery without angina pectoris: Secondary | ICD-10-CM | POA: Diagnosis not present

## 2023-12-10 DIAGNOSIS — Z818 Family history of other mental and behavioral disorders: Secondary | ICD-10-CM | POA: Diagnosis not present

## 2023-12-10 DIAGNOSIS — D62 Acute posthemorrhagic anemia: Secondary | ICD-10-CM | POA: Diagnosis not present

## 2023-12-10 DIAGNOSIS — F419 Anxiety disorder, unspecified: Secondary | ICD-10-CM | POA: Diagnosis not present

## 2023-12-10 DIAGNOSIS — Z87891 Personal history of nicotine dependence: Secondary | ICD-10-CM

## 2023-12-10 DIAGNOSIS — D649 Anemia, unspecified: Secondary | ICD-10-CM | POA: Diagnosis not present

## 2023-12-10 DIAGNOSIS — Z825 Family history of asthma and other chronic lower respiratory diseases: Secondary | ICD-10-CM | POA: Diagnosis not present

## 2023-12-10 DIAGNOSIS — R059 Cough, unspecified: Secondary | ICD-10-CM | POA: Diagnosis not present

## 2023-12-10 DIAGNOSIS — D509 Iron deficiency anemia, unspecified: Secondary | ICD-10-CM | POA: Diagnosis not present

## 2023-12-10 DIAGNOSIS — Z83438 Family history of other disorder of lipoprotein metabolism and other lipidemia: Secondary | ICD-10-CM | POA: Diagnosis not present

## 2023-12-10 DIAGNOSIS — Z66 Do not resuscitate: Secondary | ICD-10-CM | POA: Diagnosis not present

## 2023-12-10 DIAGNOSIS — K922 Gastrointestinal hemorrhage, unspecified: Secondary | ICD-10-CM | POA: Diagnosis not present

## 2023-12-10 DIAGNOSIS — K921 Melena: Secondary | ICD-10-CM | POA: Diagnosis not present

## 2023-12-10 DIAGNOSIS — Z7902 Long term (current) use of antithrombotics/antiplatelets: Secondary | ICD-10-CM | POA: Diagnosis not present

## 2023-12-10 DIAGNOSIS — Z833 Family history of diabetes mellitus: Secondary | ICD-10-CM | POA: Diagnosis not present

## 2023-12-10 DIAGNOSIS — N1831 Chronic kidney disease, stage 3a: Secondary | ICD-10-CM | POA: Diagnosis not present

## 2023-12-10 DIAGNOSIS — K449 Diaphragmatic hernia without obstruction or gangrene: Secondary | ICD-10-CM | POA: Diagnosis not present

## 2023-12-10 DIAGNOSIS — I129 Hypertensive chronic kidney disease with stage 1 through stage 4 chronic kidney disease, or unspecified chronic kidney disease: Secondary | ICD-10-CM | POA: Diagnosis not present

## 2023-12-10 DIAGNOSIS — J449 Chronic obstructive pulmonary disease, unspecified: Secondary | ICD-10-CM | POA: Diagnosis not present

## 2023-12-10 DIAGNOSIS — B9681 Helicobacter pylori [H. pylori] as the cause of diseases classified elsewhere: Secondary | ICD-10-CM | POA: Diagnosis not present

## 2023-12-10 DIAGNOSIS — J690 Pneumonitis due to inhalation of food and vomit: Secondary | ICD-10-CM | POA: Diagnosis not present

## 2023-12-10 DIAGNOSIS — H5461 Unqualified visual loss, right eye, normal vision left eye: Secondary | ICD-10-CM | POA: Diagnosis not present

## 2023-12-10 HISTORY — PX: HEMOSTASIS CLIP PLACEMENT: SHX6857

## 2023-12-10 HISTORY — PX: ENTEROSCOPY: SHX5533

## 2023-12-10 LAB — CBC
HCT: 25.5 % — ABNORMAL LOW (ref 36.0–46.0)
HCT: 31.5 % — ABNORMAL LOW (ref 36.0–46.0)
HCT: 33.7 % — ABNORMAL LOW (ref 36.0–46.0)
Hemoglobin: 10.3 g/dL — ABNORMAL LOW (ref 12.0–15.0)
Hemoglobin: 10.9 g/dL — ABNORMAL LOW (ref 12.0–15.0)
Hemoglobin: 7.9 g/dL — ABNORMAL LOW (ref 12.0–15.0)
MCH: 22.8 pg — ABNORMAL LOW (ref 26.0–34.0)
MCH: 23.7 pg — ABNORMAL LOW (ref 26.0–34.0)
MCH: 24 pg — ABNORMAL LOW (ref 26.0–34.0)
MCHC: 31 g/dL (ref 30.0–36.0)
MCHC: 32.3 g/dL (ref 30.0–36.0)
MCHC: 32.7 g/dL (ref 30.0–36.0)
MCV: 73.4 fL — ABNORMAL LOW (ref 80.0–100.0)
MCV: 73.4 fL — ABNORMAL LOW (ref 80.0–100.0)
MCV: 73.7 fL — ABNORMAL LOW (ref 80.0–100.0)
Platelets: 296 K/uL (ref 150–400)
Platelets: 342 K/uL (ref 150–400)
Platelets: 359 K/uL (ref 150–400)
RBC: 3.46 MIL/uL — ABNORMAL LOW (ref 3.87–5.11)
RBC: 4.29 MIL/uL (ref 3.87–5.11)
RBC: 4.59 MIL/uL (ref 3.87–5.11)
RDW: 19.9 % — ABNORMAL HIGH (ref 11.5–15.5)
RDW: 19.9 % — ABNORMAL HIGH (ref 11.5–15.5)
RDW: 21.4 % — ABNORMAL HIGH (ref 11.5–15.5)
WBC: 7.1 K/uL (ref 4.0–10.5)
WBC: 7.8 K/uL (ref 4.0–10.5)
WBC: 8 K/uL (ref 4.0–10.5)
nRBC: 0 % (ref 0.0–0.2)
nRBC: 0 % (ref 0.0–0.2)
nRBC: 0 % (ref 0.0–0.2)

## 2023-12-10 LAB — URINALYSIS, COMPLETE (UACMP) WITH MICROSCOPIC
Bacteria, UA: NONE SEEN
Bilirubin Urine: NEGATIVE
Glucose, UA: NEGATIVE mg/dL
Hgb urine dipstick: NEGATIVE
Ketones, ur: NEGATIVE mg/dL
Leukocytes,Ua: NEGATIVE
Nitrite: NEGATIVE
Protein, ur: NEGATIVE mg/dL
Specific Gravity, Urine: 1.014 (ref 1.005–1.030)
pH: 6 (ref 5.0–8.0)

## 2023-12-10 LAB — BASIC METABOLIC PANEL WITH GFR
Anion gap: 9 (ref 5–15)
BUN: 14 mg/dL (ref 8–23)
CO2: 22 mmol/L (ref 22–32)
Calcium: 9.1 mg/dL (ref 8.9–10.3)
Chloride: 109 mmol/L (ref 98–111)
Creatinine, Ser: 1.15 mg/dL — ABNORMAL HIGH (ref 0.44–1.00)
GFR, Estimated: 50 mL/min — ABNORMAL LOW (ref >60)
Glucose, Bld: 107 mg/dL — ABNORMAL HIGH (ref 70–99)
Potassium: 3.8 mmol/L (ref 3.5–5.1)
Sodium: 140 mmol/L (ref 135–145)

## 2023-12-10 LAB — GLUCOSE, CAPILLARY: Glucose-Capillary: 130 mg/dL — ABNORMAL HIGH (ref 70–99)

## 2023-12-10 LAB — PROCALCITONIN: Procalcitonin: 16.96 ng/mL

## 2023-12-10 SURGERY — ENTEROSCOPY
Anesthesia: General

## 2023-12-10 MED ORDER — ONDANSETRON HCL 4 MG/2ML IJ SOLN
4.0000 mg | Freq: Four times a day (QID) | INTRAMUSCULAR | Status: DC
  Administered 2023-12-10 – 2023-12-12 (×8): 4 mg via INTRAVENOUS
  Filled 2023-12-10 (×8): qty 2

## 2023-12-10 MED ORDER — IRON SUCROSE 200 MG IVPB - SIMPLE MED
200.0000 mg | Freq: Once | Status: AC
  Administered 2023-12-10: 200 mg via INTRAVENOUS
  Filled 2023-12-10: qty 200

## 2023-12-10 MED ORDER — SUCRALFATE 1 GM/10ML PO SUSP
1.0000 g | Freq: Three times a day (TID) | ORAL | Status: DC
  Administered 2023-12-10 – 2023-12-12 (×7): 1 g via ORAL
  Filled 2023-12-10 (×7): qty 10

## 2023-12-10 MED ORDER — PROCHLORPERAZINE EDISYLATE 10 MG/2ML IJ SOLN
10.0000 mg | Freq: Once | INTRAMUSCULAR | Status: AC
Start: 2023-12-10 — End: 2023-12-10
  Administered 2023-12-10: 10 mg via INTRAVENOUS
  Filled 2023-12-10: qty 2

## 2023-12-10 MED ORDER — SODIUM CHLORIDE 0.9 % IV SOLN
3.0000 g | Freq: Three times a day (TID) | INTRAVENOUS | Status: DC
  Administered 2023-12-10 – 2023-12-12 (×6): 3 g via INTRAVENOUS
  Filled 2023-12-10 (×6): qty 8

## 2023-12-10 MED ORDER — SODIUM CHLORIDE 0.9 % IV SOLN: INTRAVENOUS | Status: DC | PRN

## 2023-12-10 MED ORDER — ONDANSETRON HCL 4 MG/2ML IJ SOLN: 4.0000 mg | Freq: Four times a day (QID) | INTRAMUSCULAR | Status: DC

## 2023-12-10 MED ORDER — LIDOCAINE 2% (20 MG/ML) 5 ML SYRINGE
INTRAMUSCULAR | Status: DC | PRN
  Administered 2023-12-10: 80 mg via INTRAVENOUS

## 2023-12-10 MED ORDER — PROCHLORPERAZINE EDISYLATE 10 MG/2ML IJ SOLN: 10.0000 mg | Freq: Four times a day (QID) | INTRAMUSCULAR | Status: DC | PRN

## 2023-12-10 MED ORDER — PROPOFOL 10 MG/ML IV BOLUS
INTRAVENOUS | Status: DC | PRN
  Administered 2023-12-10 (×2): 40 mg via INTRAVENOUS
  Administered 2023-12-10: 20 mg via INTRAVENOUS
  Administered 2023-12-10 (×2): 40 mg via INTRAVENOUS

## 2023-12-10 MED ORDER — PROPOFOL 500 MG/50ML IV EMUL
INTRAVENOUS | Status: DC | PRN
  Administered 2023-12-10: 130 ug/kg/min via INTRAVENOUS

## 2023-12-10 MED ORDER — ONDANSETRON HCL 4 MG/2ML IJ SOLN
4.0000 mg | Freq: Four times a day (QID) | INTRAMUSCULAR | Status: DC
Start: 2023-12-10 — End: 2023-12-10

## 2023-12-10 MED ORDER — FERROUS SULFATE 325 (65 FE) MG PO TABS
325.0000 mg | ORAL_TABLET | Freq: Every day | ORAL | Status: DC
  Administered 2023-12-11 – 2023-12-12 (×2): 325 mg via ORAL
  Filled 2023-12-10 (×2): qty 1

## 2023-12-10 NOTE — Transfer of Care (Signed)
 Immediate Anesthesia Transfer of Care Note  Patient: Judy Carlson  Procedure(s) Performed: ENTEROSCOPY CONTROL OF HEMORRHAGE, GI TRACT, ENDOSCOPIC, BY CLIPPING OR OVERSEWING  Patient Location: PACU; Endo  Anesthesia Type:MAC  Level of Consciousness: drowsy  Airway & Oxygen Therapy: Patient Spontanous Breathing and Patient connected to face mask oxygen  Post-op Assessment: Report given to RN and Post -op Vital signs reviewed and stable  Post vital signs: Reviewed and stable  Last Vitals:  Vitals Value Taken Time  BP 104/74 12/10/23 10:21  Temp 35.9 C 12/10/23 10:12  Pulse 86 12/10/23 10:23  Resp 28 12/10/23 10:22  SpO2 75 % 12/10/23 10:23  Vitals shown include unfiled device data.  Last Pain:  Vitals:   12/10/23 1020  TempSrc:   PainSc: 0-No pain         Complications: No notable events documented.

## 2023-12-10 NOTE — Progress Notes (Signed)
 OT Cancellation Note  Patient Details Name: Judy Carlson MRN: [id number] DOB: Jan 08, 1951   Cancelled Treatment:    Reason Eval/Treat Not Completed: Patient at procedure or test/ unavailable.  Continue efforts as appropriate.    Bryant Lipps D Weslyn Holsonback 12/10/2023, 2:14 PM 12/10/2023  RP, OTR/L  Acute Rehabilitation Services  Office:  [phone]

## 2023-12-10 NOTE — Hospital Course (Signed)
 Patient with PMH of HTN, COPD, recurrent GI bleed, HLD on Repatha , CAD medically managed, GERD presented to the hospital with complaints of nausea vomiting and syncope. Presents with nausea and vomiting.  Also chronic shortness of breath and cough.  And dizziness.  Reports black hard bowel movement.  And found to have symptomatic anemia.   Assessment and Plan: Symptomatic anemia. Recurrent syncope. Recurrent episodes of passing out.  Blood pressure stable.  EKG unremarkable. Witnessed syncope twice. No loss of pulse.  No significant post event confusion. No focal deficit. Received PRBC transfusion.  Iron  deficiency anemia. Replaced. Monitor.   Upper GI bleed. With melena and vomiting. AVM. Has prior history of recurrent upper GI bleed with iron  deficiency and folate deficiency with EGD showing gastric ulceration and gastritis. Continue PPI twice daily. GI consulted. Underwent enteroscopy.  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 Continue clear liquid diet. Monitor H&H.   CAD. PAD. Right carotid artery endarterectomy. CVA. Chronic right-sided vision loss. On aspirin  and Plavix . Currently on hold.   Intractable nausea and vomiting. Etiology not clear. Likely associate with gastritis Continue PPI twice daily. Zofran  and Compazine  as needed.   Concern for BPPV. Patient reports that she has chronic dizziness and vertigo especially with movement of her head. Will check vestibular rehab   Thrombocytosis. Likely stress reaction. For now monitor.   Goals of care conversation. Per family patient has DNR. Consistent with her prior CODE STATUS. Will continue DNR.  Fever. Patient's temp was 101.6 at 4 PM on 10/7.  Patient is tachycardic.  X-ray abdomen does not show any evidence of acute abnormality. With patient's recurrent nausea and vomiting concern for  gastroenteritis versus aspiration pneumonitis. Check UA, GI pathogen panel, blood cultures.  Initiate on Unasyn. Continue with IV fluids.

## 2023-12-10 NOTE — Anesthesia Postprocedure Evaluation (Signed)
 Anesthesia Post Note  Patient: Judy Carlson  Procedure(s) Performed: ENTEROSCOPY CONTROL OF HEMORRHAGE, GI TRACT, ENDOSCOPIC, BY CLIPPING OR OVERSEWING     Patient location during evaluation: PACU Anesthesia Type: MAC Level of consciousness: awake Pain management: pain level controlled Vital Signs Assessment: post-procedure vital signs reviewed and stable Respiratory status: spontaneous breathing, nonlabored ventilation and respiratory function stable Cardiovascular status: blood pressure returned to baseline and stable Postop Assessment: no apparent nausea or vomiting Anesthetic complications: no   No notable events documented.  Last Vitals:  Vitals:   12/10/23 1040 12/10/23 1102  BP: 95/68 123/69  Pulse:  (!) 48  Resp:  18  Temp:  36.6 C  SpO2: 92% (!) 86%    Last Pain:  Vitals:   12/10/23 1040  TempSrc:   PainSc: 0-No pain                 Delon Aisha Arch

## 2023-12-10 NOTE — Progress Notes (Signed)
 PT Cancellation Note  Patient Details Name: Judy Carlson MRN: [id number] DOB: [birth year]   Cancelled Treatment:    Reason Eval/Treat Not Completed: Patient at procedure or test/unavailable; will follow up as schedule permits and pt available.   [name] 12/10/2023, 8:54 AM [name], PT Acute Rehabilitation Services [phone] 12/10/2023

## 2023-12-10 NOTE — Anesthesia Preprocedure Evaluation (Addendum)
 Anesthesia Evaluation  Patient identified by MRN, date of birth, ID band Patient awake    Reviewed: Allergy & Precautions, NPO status , Patient's Chart, lab work & pertinent test results  History of Anesthesia Complications (+) history of anesthetic complications (woke up during procedure)  Airway Mallampati: III  TM Distance: >3 FB Neck ROM: Full   Comment: Previous grade I view with Miller 2, easy mask Dental  (+) Dental Advisory Given   Pulmonary neg shortness of breath, neg sleep apnea, COPD, neg recent URI, former smoker   Pulmonary exam normal breath sounds clear to auscultation       Cardiovascular hypertension (amlodipine -valsartan , metoprolol ), Pt. on medications and Pt. on home beta blockers (-) angina (-) Past MI, (-) Cardiac Stents and (-) CABG + dysrhythmias (palpitations)  Rhythm:Regular Rate:Normal  HLD, Raynaud's, carotid artery stenosis s/p TCAR  TTE 12/24/2018: IMPRESSIONS    1. Left ventricular ejection fraction, by visual estimation, is 60 to  65%. The left ventricle has normal function. There is no left ventricular  hypertrophy.   2. Left ventricular diastolic function could not be evaluated pattern of  LV diastolic filling.   3. Global right ventricle has normal systolic function.The right  ventricular size is normal. Mildly increased right ventricular wall  thickness.   4. Left atrial size was not well visualized.   5. Right atrial size was not well visualized.   6. The mitral valve is grossly normal. Trace mitral valve regurgitation.   7. The tricuspid valve is grossly normal. Tricuspid valve regurgitation  is trivial.   8. The aortic valve is tricuspid Aortic valve regurgitation was not  visualized by color flow Doppler. Structurally normal aortic valve, with  no evidence of sclerosis or stenosis.   9. The pulmonic valve was not well visualized. Pulmonic valve  regurgitation was not assessed by  color flow Doppler.  10. The inferior vena cava is normal in size with greater than 50%  respiratory variability, suggesting right atrial pressure of 3 mmHg.     Neuro/Psych neg Seizures PSYCHIATRIC DISORDERS Anxiety Depression    TIA Neuromuscular disease (lumbosacral spondylosis) CVA, No Residual Symptoms    GI/Hepatic Neg liver ROS, PUD,GERD  Medicated,,Diverticulosis    Endo/Other  negative endocrine ROS    Renal/GU CRFRenal disease     Musculoskeletal  (+) Arthritis , Osteoarthritis,    Abdominal   Peds  Hematology  (+) Blood dyscrasia, anemia Lab Results      Component                Value               Date                      WBC                      7.8                 12/10/2023                HGB                      10.3 (L)            12/10/2023                HCT  31.5 (L)            12/10/2023                MCV                      73.4 (L)            12/10/2023                PLT                      342                 12/10/2023              Anesthesia Other Findings Last Plavix : yesterday morning  S/p blood transfusion  Reproductive/Obstetrics                              Anesthesia Physical Anesthesia Plan  ASA: 3  Anesthesia Plan: MAC   Post-op Pain Management: Minimal or no pain anticipated   Induction: Intravenous  PONV Risk Score and Plan: 2 and Propofol  infusion, TIVA and Treatment may vary due to age or medical condition  Airway Management Planned: Natural Airway and Simple Face Mask  Additional Equipment:   Intra-op Plan:   Post-operative Plan:   Informed Consent: I have reviewed the patients History and Physical, chart, labs and discussed the procedure including the risks, benefits and alternatives for the proposed anesthesia with the patient or authorized representative who has indicated his/her understanding and acceptance.   Patient has DNR.  Discussed DNR with patient and  Suspend DNR.   Dental advisory given  Plan Discussed with: Anesthesiologist and CRNA  Anesthesia Plan Comments: (Discussed with patient risks of MAC including, but not limited to, minor pain or discomfort, hearing people in the room, and possible need for backup general anesthesia. Risks for general anesthesia also discussed including, but not limited to, sore throat, hoarse voice, chipped/damaged teeth, injury to vocal cords, nausea and vomiting, allergic reactions, lung infection, heart attack, stroke, and death. All questions answered. )         Anesthesia Quick Evaluation

## 2023-12-10 NOTE — Plan of Care (Signed)

## 2023-12-10 NOTE — Op Note (Signed)
 [facility] Patient Name: Judy Carlson Procedure Date : 12/10/2023 MRN: [id number] Attending MD: Elspeth SQUIBB. Leigh , MD, [id number] Date of Birth: 10-23-1950 CSN: [id number] Age: 73 Admit Type: Inpatient Procedure:                Small bowel enteroscopy Indications:              Iron  deficiency anemia, Melena - on Plavix  / aspirin  Providers:                Elspeth SQUIBB. Leigh, MD, Ozell Pouch, Corene Southgate, Technician Referring MD:              Medicines:                Monitored Anesthesia Care Complications:            No immediate complications. Estimated blood loss:                            Minimal. Estimated Blood Loss:     Estimated blood loss was minimal. Procedure:                Pre-Anesthesia Assessment:                           - Prior to the procedure, a History and Physical                            was performed, and patient medications and                            allergies were reviewed. The patient's tolerance of                            previous anesthesia was also reviewed. The risks                            and benefits of the procedure and the sedation                            options and risks were discussed with the patient.                            All questions were answered, and informed consent                            was obtained. Prior Anticoagulants: The patient has                            taken Plavix  (clopidogrel ), last dose was 2 days                            prior to procedure. ASA Grade Assessment: III - A  patient with severe systemic disease. After                            reviewing the risks and benefits, the patient was                            deemed in satisfactory condition to undergo the                            procedure.                           After obtaining informed consent, the endoscope was                            passed under  direct vision. Throughout the                            procedure, the patient's blood pressure, pulse, and                            oxygen saturations were monitored continuously. The                            [id number]-HQ190L ([id number]) Olympus colonoscope was                            introduced through the mouth and advanced to the                            proximal jejunum. The small bowel enteroscopy was                            accomplished without difficulty. The patient                            tolerated the procedure well. Scope In: Scope Out: Findings:      Esophagogastric landmarks were identified: the Z-line was found at 36       cm, the gastroesophageal junction was found at 36 cm and the upper       extent of the gastric folds was found at 38 cm from the incisors.      A 2 cm hiatal hernia was present.      The exam of the esophagus was otherwise normal.      Three small angiodysplastic lesions with no bleeding were found in the       proximal gastric body. Fulguration to ablate the lesions to prevent       bleeding by argon plasma was successful. The largest of these was quite       friable and bled with initial APC therapy application. Multiple       treatments with APC were peformed to achieve hemostasis. To prevent       bleeding post-intervention, two hemostatic clips were successfully       placed across the site.      The exam of the stomach was otherwise normal.  Three angiodysplastic lesions with bleeding were found in the duodenal       bulb. One of the AVMs in the distal bulb was actively bleeding with red       blood and clot in the area. The area was lavaged, site identified, and       treated with APC. Multiple applications were needed to control bleeding.       The other AVMS were treated with APC with good result. To prevent       bleeding post-intervention at the actively bleeding site that was       treated with APC, two hemostatic clips were  successfully placed across       the lesion.      Two angiodysplastic lesions with no bleeding were found in the second       portion of the duodenum. Fulguration to ablate the lesions to prevent       bleeding by argon plasma was successful.      The exam of the duodenum was otherwise normal.      There was no evidence of significant pathology in the proximal jejunum. Impression:               - Esophagogastric landmarks identified.                           - 2 cm hiatal hernia.                           - Three non-bleeding angiodysplastic lesions in the                            stomach. Treated with argon plasma coagulation                            (APC). 2 Clips were placed.                           - Three bleeding angiodysplastic lesions in the                            duodenum. Treated with argon plasma coagulation                            (APC). 2 Clips were placed.                           - Two non-bleeding angiodysplastic lesions in the                            duodenum. Treated with argon plasma coagulation                            (APC).                           - The examined portion of the jejunum was normal.                           Overall, 8  AVMs treated. Actively bleeding lesion                            in the duodenal bulb was the culprit for her                            symptoms, treated with APC and clips. One quite                            friable AVM in the stomach treated, clips also                            applied given bleeding during endoscopic therapy. Recommendation:           - Return patient to hospital ward for ongoing care.                           - Clear liquid diet today.                           - Continue protonix  40mg  IV BID                           - Continue present medications.                           - Continue to hold Plavix                            - Trend Hgb, we will reassess the patient tomorrow. Procedure  Code(s):        --- Professional ---                           [phone], Small intestinal endoscopy, enteroscopy                            beyond second portion of duodenum, not including                            ileum; with control of bleeding (eg, injection,                            bipolar cautery, unipolar cautery, laser, heater                            probe, stapler, plasma coagulator) Diagnosis Code(s):        --- Professional ---                           K44.9, Diaphragmatic hernia without obstruction or                            gangrene                           K31.811, Angiodysplasia of  stomach and duodenum                            with bleeding                           D50.9, Iron  deficiency anemia, unspecified                           K92.1, Melena (includes Hematochezia) CPT copyright 2022 American [employer] Association. All rights reserved. The codes documented in this report are preliminary and upon coder review may  be revised to meet current compliance requirements. Elspeth P. Taevon Aschoff, MD 12/10/2023 10:22:04 AM This report has been signed electronically. Number of Addenda: 0

## 2023-12-10 NOTE — Progress Notes (Signed)
 [facility] Progress Note Patient: Judy Carlson FMW:[id number] DOB: 10/23/1950  DOA: 12/09/2023 DOS: the patient was seen and examined on 12/10/2023  Brief Hospital Course: Patient with PMH of HTN, COPD, recurrent GI bleed, HLD on Repatha , CAD medically managed, GERD presented to the hospital with complaints of nausea vomiting and syncope. Presents with nausea and vomiting.  Also chronic shortness of breath and cough.  And dizziness.  Reports black hard bowel movement.  And found to have symptomatic anemia.   Assessment and Plan: Symptomatic anemia. Recurrent syncope. Recurrent episodes of passing out.  Blood pressure stable.  EKG unremarkable. Witnessed syncope twice. No loss of pulse.  No significant post event confusion. No focal deficit. Received PRBC transfusion.  Iron  deficiency anemia. Replaced. Monitor.   Upper GI bleed. With melena and vomiting. AVM. Has prior history of recurrent upper GI bleed with iron  deficiency and folate deficiency with EGD showing gastric ulceration and gastritis. Continue PPI twice daily. GI consulted. Underwent enteroscopy.  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 Continue clear liquid diet. Monitor H&H.   CAD. PAD. Right carotid artery endarterectomy. CVA. Chronic right-sided vision loss. On aspirin  and Plavix . Currently on hold.   Intractable nausea and vomiting. Etiology not clear. Likely associate with gastritis Continue PPI twice daily. Zofran  and Compazine  as needed.   Concern for BPPV. Patient reports that she has chronic dizziness and vertigo especially with movement of her head. Will check vestibular rehab   Thrombocytosis. Likely stress reaction. For now monitor.   Goals of care conversation. Per family patient has DNR. Consistent with her prior CODE STATUS. Will continue DNR.  Fever. Patient's temp was  101.6 at 4 PM on 10/7.  Patient is tachycardic.  X-ray abdomen does not show any evidence of acute abnormality. With patient's recurrent nausea and vomiting concern for gastroenteritis versus aspiration pneumonitis. Check UA, GI pathogen panel, blood cultures.  Initiate on Unasyn. Continue with IV fluids.   Subjective: Reported nausea after the procedure.  Also had an episode of dry heaving without any actual vomiting.  Denies any chest pain or abdominal pain.  No fever no chills at the time of my evaluation but later in the day had an episode of fever.  Passing gas.  Physical Exam: Clear to auscultation. S1-S2 present Bowel sound present. Nontender. No edema.  Data Reviewed: I have Reviewed nursing notes, Vitals, and Lab results. Since last encounter, pertinent lab results CBC and BMP   . I have ordered test including CBC blood cultures UA and x-ray abdomen  . I have discussed pt's care plan and test results with GI  .   Disposition: Status is: Inpatient Remains inpatient appropriate because: Monitor for improvement in fever and mentation and H&H  SCDs Start: 12/09/23 1817   Family Communication: No one at bedside Level of care: Telemetry Medical   Vitals:   12/10/23 1040 12/10/23 1102 12/10/23 1318 12/10/23 1651  BP: 95/68 123/69 (!) 162/143 128/78  Pulse:  (!) 48 (!) 51 (!) 111  Resp:  18    Temp:  97.8 F (36.6 C) (!) 97.5 F (36.4 C) (!) 101.6 F (38.7 C)  TempSrc:   Oral Oral  SpO2: 92% (!) 86% 90% 96%  Weight:      Height:         Author: Yetta Blanch, MD 12/10/2023 5:20 PM  Please look on [email or web address] to find  out who is on call.

## 2023-12-10 NOTE — Plan of Care (Signed)
  Problem: Education: Goal: Knowledge of General Education information will improve Description: Including pain rating scale, medication(s)/side effects and non-pharmacologic comfort measures Outcome: Progressing   Problem: Clinical Measurements: Goal: Ability to maintain clinical measurements within normal limits will improve Outcome: Progressing Goal: Will remain free from infection Outcome: Progressing Goal: Diagnostic test results will improve Outcome: Progressing Goal: Respiratory complications will improve Outcome: Progressing Goal: Cardiovascular complication will be avoided Outcome: Progressing   Problem: Health Behavior/Discharge Planning: Goal: Ability to manage health-related needs will improve Outcome: Progressing   Problem: Activity: Goal: Risk for activity intolerance will decrease Outcome: Progressing   Problem: Elimination: Goal: Will not experience complications related to bowel motility Outcome: Progressing Goal: Will not experience complications related to urinary retention Outcome: Progressing   Problem: Pain Managment: Goal: General experience of comfort will improve and/or be controlled Outcome: Progressing   Problem: Skin Integrity: Goal: Risk for impaired skin integrity will decrease Outcome: Progressing

## 2023-12-10 NOTE — Interval H&P Note (Signed)
 History and Physical Interval Note: Patient stable overnight - transfused PRBC, responded appropriately. IRon  studies returned c/w iron  deficiency. Because of dark stools, IDA, symptomatic anemia, planning enteroscopy today. Have discussed risks / benefits of the exam and anesthesia she wishes to proceed. PLavix  has been held for 1 day. No interval changes overnight.   12/10/2023 8:39 AM  Judy Carlson  has presented today for surgery, with the diagnosis of Symptomatic anemia, syncope, black stool in setting of Plavix  and aspirin .  The various methods of treatment have been discussed with the patient and family. After consideration of risks, benefits and other options for treatment, the patient has consented to  Procedure(s): ENTEROSCOPY (N/A) as a surgical intervention.  The patient's history has been reviewed, patient examined, no change in status, stable for surgery.  I have reviewed the patient's chart and labs.  Questions were answered to the patient's satisfaction.     Elspeth P Japheth Diekman

## 2023-12-11 ENCOUNTER — Telehealth (HOSPITAL_COMMUNITY): Payer: Self-pay | Admitting: Internal Medicine

## 2023-12-11 ENCOUNTER — Telehealth: Payer: Self-pay

## 2023-12-11 DIAGNOSIS — K31819 Angiodysplasia of stomach and duodenum without bleeding: Secondary | ICD-10-CM

## 2023-12-11 DIAGNOSIS — D72819 Decreased white blood cell count, unspecified: Secondary | ICD-10-CM

## 2023-12-11 DIAGNOSIS — J690 Pneumonitis due to inhalation of food and vomit: Secondary | ICD-10-CM

## 2023-12-11 DIAGNOSIS — D649 Anemia, unspecified: Secondary | ICD-10-CM | POA: Diagnosis not present

## 2023-12-11 DIAGNOSIS — K5521 Angiodysplasia of colon with hemorrhage: Secondary | ICD-10-CM | POA: Diagnosis not present

## 2023-12-11 DIAGNOSIS — R059 Cough, unspecified: Secondary | ICD-10-CM

## 2023-12-11 DIAGNOSIS — D509 Iron deficiency anemia, unspecified: Secondary | ICD-10-CM | POA: Insufficient documentation

## 2023-12-11 DIAGNOSIS — Z7902 Long term (current) use of antithrombotics/antiplatelets: Secondary | ICD-10-CM

## 2023-12-11 DIAGNOSIS — K922 Gastrointestinal hemorrhage, unspecified: Secondary | ICD-10-CM

## 2023-12-11 LAB — COMPREHENSIVE METABOLIC PANEL WITH GFR
ALT: 15 U/L (ref 0–44)
AST: 18 U/L (ref 15–41)
Albumin: 2.7 g/dL — ABNORMAL LOW (ref 3.5–5.0)
Alkaline Phosphatase: 56 U/L (ref 38–126)
Anion gap: 8 (ref 5–15)
BUN: 12 mg/dL (ref 8–23)
CO2: 21 mmol/L — ABNORMAL LOW (ref 22–32)
Calcium: 8.6 mg/dL — ABNORMAL LOW (ref 8.9–10.3)
Chloride: 110 mmol/L (ref 98–111)
Creatinine, Ser: 1.5 mg/dL — ABNORMAL HIGH (ref 0.44–1.00)
GFR, Estimated: 37 mL/min — ABNORMAL LOW (ref >60)
Glucose, Bld: 102 mg/dL — ABNORMAL HIGH (ref 70–99)
Potassium: 3.9 mmol/L (ref 3.5–5.1)
Sodium: 139 mmol/L (ref 135–145)
Total Bilirubin: 0.6 mg/dL (ref 0.0–1.2)
Total Protein: 5.7 g/dL — ABNORMAL LOW (ref 6.5–8.1)

## 2023-12-11 LAB — CBC WITH DIFFERENTIAL/PLATELET
Abs Immature Granulocytes: 0.1 K/uL — ABNORMAL HIGH (ref 0.00–0.07)
Basophils Absolute: 0.1 K/uL (ref 0.0–0.1)
Basophils Relative: 0 %
Eosinophils Absolute: 0 K/uL (ref 0.0–0.5)
Eosinophils Relative: 0 %
HCT: 29.8 % — ABNORMAL LOW (ref 36.0–46.0)
Hemoglobin: 9.7 g/dL — ABNORMAL LOW (ref 12.0–15.0)
Immature Granulocytes: 1 %
Lymphocytes Relative: 8 %
Lymphs Abs: 1.5 K/uL (ref 0.7–4.0)
MCH: 23.8 pg — ABNORMAL LOW (ref 26.0–34.0)
MCHC: 32.6 g/dL (ref 30.0–36.0)
MCV: 73.2 fL — ABNORMAL LOW (ref 80.0–100.0)
Monocytes Absolute: 1.3 K/uL — ABNORMAL HIGH (ref 0.1–1.0)
Monocytes Relative: 6 %
Neutro Abs: 16.9 K/uL — ABNORMAL HIGH (ref 1.7–7.7)
Neutrophils Relative %: 85 %
Platelets: 317 K/uL (ref 150–400)
RBC: 4.07 MIL/uL (ref 3.87–5.11)
RDW: 20.7 % — ABNORMAL HIGH (ref 11.5–15.5)
WBC: 19.8 K/uL — ABNORMAL HIGH (ref 4.0–10.5)
nRBC: 0 % (ref 0.0–0.2)

## 2023-12-11 LAB — MAGNESIUM: Magnesium: 1.7 mg/dL (ref 1.7–2.4)

## 2023-12-11 MED ORDER — SODIUM CHLORIDE 0.45 % IV SOLN: INTRAVENOUS | Status: AC

## 2023-12-11 NOTE — Telephone Encounter (Signed)
 Auth Submission: NO AUTH NEEDED Site of care: Site of care: [facility] Payer: [employer] Medication & CPT/J Code(s) submitted: Venofer  (Iron  Sucrose) J1756 Diagnosis Code:  Route of submission (phone, fax, portal): portal Phone # Fax # Auth type: Buy/Bill PB Units/visits requested: 200mg  x 4 doses Reference number:  Approval from: 12/11/23 to 03/04/24

## 2023-12-11 NOTE — Progress Notes (Signed)
 Progress Note   Subjective  Fever overnight, but she is feeling okay. Some cough, no chest pain or shortness of breath. No bleeding symptoms. Tolerating clears okay.    Objective   Vital signs in last 24 hours: Temp:  [96.6 F (35.9 C)-101.6 F (38.7 C)] 99.3 F (37.4 C) (10/08 0751) Pulse Rate:  [48-111] 96 (10/08 0751) Resp:  [16-30] 17 (10/08 0350) BP: (95-162)/(53-143) 112/69 (10/08 0751) SpO2:  [86 %-96 %] 93 % (10/08 0751) Last BM Date : 12/09/23 General:    AA female in NAD Neurologic:  Alert and oriented,  grossly normal neurologically. Psych:  Cooperative. Normal mood and affect.  Intake/Output from previous day: 10/07 0701 - 10/08 0700 In: 1392.3 [P.O.:320; I.V.:861.7; IV Piggyback:210.7] Out: 700 [Urine:700] Intake/Output this shift: No intake/output data recorded.  Lab Results: Recent Labs    12/10/23 0420 12/10/23 1219 12/11/23 0325  WBC 7.8 8.0 19.8*  HGB 10.3* 10.9* 9.7*  HCT 31.5* 33.7* 29.8*  PLT 342 359 317   BMET Recent Labs    12/09/23 1338 12/10/23 0420 12/11/23 0325  NA 140 140 139  K 4.0 3.8 3.9  CL 111 109 110  CO2 19* 22 21*  GLUCOSE 157* 107* 102*  BUN 19 14 12   CREATININE 1.35* 1.15* 1.50*  CALCIUM  9.5 9.1 8.6*   LFT Recent Labs    12/11/23 0325  PROT 5.7*  ALBUMIN  2.7*  AST 18  ALT 15  ALKPHOS 56  BILITOT 0.6   PT/INR Recent Labs    12/09/23 1532 12/09/23 1928  LABPROT 13.3 14.2  INR 1.0 1.0    Studies/Results: DG Abd Portable 1V Result Date: 12/10/2023 CLINICAL DATA:  Intractable nausea and vomiting. EXAM: PORTABLE ABDOMEN - 1 VIEW COMPARISON:  CT abdomen and pelvis 04/05/2023 FINDINGS: Scattered stool throughout the colon. No small or large bowel distention. Linear foreign bodies projecting over the upper abdomen likely representing postoperative change or surface structures. No radiopaque stones. Degenerative changes in the spine and hips. Soft tissue contours appear intact. Visualized lung bases are  clear. IMPRESSION: Normal nonobstructive bowel gas pattern. Electronically Signed   By: Elsie Gravely M.D.   On: 12/10/2023 20:08   CT HEAD WO CONTRAST ( ) Result Date: 12/09/2023 EXAM: CT HEAD WITHOUT CONTRAST 12/09/2023 09:14:04 PM TECHNIQUE: CT of the head was performed without the administration of intravenous contrast. Automated exposure control, iterative reconstruction, and/or weight based adjustment of the mA/kV was utilized to reduce the radiation dose to as low as reasonably achievable. COMPARISON: CT Head dated 08/11/2023. CLINICAL HISTORY: Syncope/presyncope, suspected cerebrovascular cause. FINDINGS: BRAIN AND VENTRICLES: No acute hemorrhage, no evidence of acute infarct, no hydrocephalus, and no extra-axial collection or mass effect. ORBITS: No acute abnormality. SINUSES: No acute abnormality. SOFT TISSUES AND SKULL: No acute soft tissue abnormality and no skull fracture. IMPRESSION: 1. No acute intracranial abnormality. Electronically signed by: Franky Stanford MD 12/09/2023 11:05 PM EDT RP Workstation: [id number]       Assessment / Plan:    73 y/o female here with the following:  Upper GI bleed Iron  deficiency anemia Small bowel AVMs Gastric AVMs Antiplatelet use Leukocytosis  Enteroscopy done yesterday.  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  She had a total of 5 BMs in the small bowel and 3 AVMs in the stomach.  One of the AVMs in the stomach was rather friable and had bleeding during endoscopic treatment, also treated with  2 hemostasis clips.  I suspect the duodenal bulb AVM was the cause of her symptoms and anemia recently.  Post procedure she has not had any further bleeding symptoms, BUN is normal, I do not think she is having any further active bleeding and tolerating clears.  I think we can advance her to a soft diet today.  I would continue twice daily PPI for a few weeks to help reduce risk  for post APC ulcer related bleeding.  I would hold Plavix  for a few more days, perhaps resume in 3 days if she is otherwise stable from a bleeding perspective.  Otherwise, has had some cough and a leukocytosis that has developed post procedure, with a fever overnight.  Possible she had an aspiration pneumonitis.  Would recommend chest x-ray this morning.  She has been covered empirically with antibiotics and would continue that for now.  She has mild cough but otherwise no chest pain.  Given this occurrence would keep her in the hospital today and monitor her course, discharge possible tomorrow if she does well today.  RECOMMEND: - soft diet - continue protonix  40mg  BID - hold Plavix  for now - check Hgb tomorrow, monitor for recurrent bleeding - CXR -PA/lat - continue empiric antibiotics  Call with questions we will reassess her tomorrow.   Marcey Naval, MD [facility]

## 2023-12-11 NOTE — Progress Notes (Signed)
 Transition of Care [facility]) - Inpatient Brief Assessment   Patient Details  Name: Judy Carlson MRN: [id number] Date of Birth: November 16, 1950  Transition of Care [facility]) CM/SW Contact:    Rosaline JONELLE Joe, RN Phone Number: 12/11/2023, 2:58 PM   Clinical Narrative: Patient admitted to the hospital from home with nausea, vomiting and syncope.  Patient lives with family.  Patient was seen by PT with no needs.  No IP Care management needs at this time.   Transition of Care Asessment: Insurance and Status: (P) Insurance coverage has been reviewed Patient has primary care physician: (P) Yes Home environment has been reviewed: (P) from home with family Prior level of function:: (P) self Prior/Current Home Services: (P) No current home services Social Drivers of Health Review: (P) SDOH reviewed no interventions necessary Readmission risk has been reviewed: (P) Yes Transition of care needs: (P) no transition of care needs at this time

## 2023-12-11 NOTE — Telephone Encounter (Signed)
 Patient referred to infusion pharmacy team for ambulatory infusion of IV iron .  Insurance - [employer] - Site of care: [facility] Dx code - D64.9/K92.2/E61.1 IV Iron  Therapy - Patient received Venofer  200 mg IV x 1. Will enter Venofer  200 mg Iv  x4 Infusion appointments - Scheduling team will schedule patient as soon as possible.  Miracle Mongillo D. Evianna Chandran, PharmD

## 2023-12-11 NOTE — Progress Notes (Signed)
 [facility] PROGRESS NOTE   DENE LANDSBERG FMW:[id number] DOB: 06-Apr-1950 DOA: 12/09/2023  PCP: Garald Karlynn GAILS, MD  Brief History: Patient with PMH of HTN, COPD, recurrent GI bleed, HLD on Repatha , CAD medically managed, GERD presented to the hospital with complaints of nausea vomiting and syncope.  Mentioned dark-colored stool.  Was found to have severe anemia.  Patient was hospitalized for further management.    Consultants: Gastroenterology  Procedures:  Small bowel enteroscopy Impression:               - Esophagogastric landmarks identified.                           - 2 cm hiatal hernia.                           - Three non-bleeding angiodysplastic lesions in the                            stomach. Treated with argon plasma coagulation                            (APC). 2 Clips were placed.                           - Three bleeding angiodysplastic lesions in the                            duodenum. Treated with argon plasma coagulation                            (APC). 2 Clips were placed.                           - Two non-bleeding angiodysplastic lesions in the                            duodenum. Treated with argon plasma coagulation                            (APC).                           - The examined portion of the jejunum was normal.                           Overall, 8 AVMs treated. Actively bleeding lesion                            in the duodenal bulb was the culprit for her                            symptoms, treated with APC and clips. One quite                            friable AVM in the stomach treated, clips also  applied given bleeding during endoscopic therapy. Recommendation:           - Return patient to hospital ward for ongoing care.                           - Clear liquid diet today.                           - Continue protonix  40mg  IV BID                           - Continue present medications.                            - Continue to hold Plavix                            - Trend Hgb, we will reassess the patient tomorrow.    Subjective/Interval History: Patient feels well.  No further bleeding episodes.  Denies any nausea vomiting or abdominal pain.  Denies any shortness of breath but has noticed a cough.  No chest pain.    Assessment/Plan:  Recurrent syncope. Syncope most likely due to anemia.  No further episodes after blood transfusion.  Ambulate in the hallway.  Iron  deficiency anemia/symptomatic anemia Transfused PRBC.  Was given iron  supplements.  Improvement in hemoglobin noted.   Upper GI bleed/AVM Has prior history of recurrent upper GI bleed with iron  deficiency and folate deficiency with EGD showing gastric ulceration and gastritis. Patient was seen by gastroenterology.  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  These were treated. Remains on PPI.  Also noted to be on Carafate. Plavix  on hold to be held for another 3 days.    Aspiration pneumonia Noted to have a fever on 10/7.  Abdominal x-ray did not show any acute findings.  Patient with cough.  Likely aspirated.  Lungs with good air entry today.  Not hypoxic.  No clear indication to do chest x-ray since she is otherwise stable.  Will continue with Unasyn for today.  If she remains stable she can be transition to oral antibiotics tomorrow.  WBC is noted to be elevated and we will recheck tomorrow.  Acute kidney injury on chronic kidney disease stage IIIa Mild increase in creatinine noted today.  Possibly from hypovolemia from fever.  Will gently hydrate and recheck labs in the morning.  Avoid nephrotoxic agents.  Monitor urine output.   CAD. PAD. Right carotid artery endarterectomy. CVA. Chronic right-sided vision loss. Antiplatelets on hold currently.  Resume in 3 days.   Intractable nausea and vomiting. Probably as a result of GI bleed.  Improved.  On soft diet currently.    Concern for BPPV. Patient reports that she has chronic dizziness and vertigo especially with movement of her head.  No neurological deficits noted.  Seen by physical therapy.   Thrombocytosis. Likely stress reaction.    DVT Prophylaxis: SCDs Code Status: DNR Family Communication: Discussed with patient Disposition Plan: Anticipate discharge in 24 to 48 hours    Medications: Scheduled:  sodium chloride    Intravenous Once   sodium chloride    Intravenous Once   ferrous sulfate   325 mg Oral Q breakfast   ondansetron   4 mg Intravenous Q6H   pantoprazole  (PROTONIX ) IV  40 mg Intravenous Q12H   sucralfate  1 g Oral TID WC & HS   Continuous:  sodium chloride      ampicillin-sulbactam (UNASYN) IV 3 g (12/11/23 0837)   PRN:acetaminophen  **OR** acetaminophen , prochlorperazine   Antibiotics: Anti-infectives (From admission, onward)    Start     Dose/Rate Route Frequency Ordered Stop   12/10/23 1815  Ampicillin-Sulbactam (UNASYN) 3 g in sodium chloride  0.9 % 100 mL IVPB        3 g 200 mL/hr over 30 Minutes Intravenous Every 8 hours 12/10/23 1719         Objective:  Vital Signs  Vitals:   12/10/23 2003 12/11/23 0100 12/11/23 0350 12/11/23 0751  BP: 128/61 125/65 125/72 112/69  Pulse: (!) 106 90 (!) 109 96  Resp: 18 16 17    Temp: 99.9 F (37.7 C) 99 F (37.2 C) 98.6 F (37 C) 99.3 F (37.4 C)  TempSrc: Oral Oral Oral Oral  SpO2: 95% 92% 93% 93%  Weight:      Height:        Intake/Output Summary (Last 24 hours) at 12/11/2023 0925 Last data filed at 12/11/2023 0341 Gross per 24 hour  Intake 1392.33 ml  Output 700 ml  Net 692.33 ml   Filed Weights   12/09/23 1320  Weight: 80.3 kg    General appearance: Awake alert.  In no distress Resp: Normal effort at rest.  Coarse breath sounds bilaterally.  Few crackles at the right base.  No wheezing or rhonchi. Cardio: S1-S2 is normal regular.  No S3-S4.  No rubs murmurs or bruit GI: Abdomen is soft.  Nontender  nondistended.  Bowel sounds are present normal.  No masses organomegaly Extremities: No edema.  Full range of motion of lower extremities. Neurologic: Alert and oriented x3.  No focal neurological deficits.    Lab Results:  Data Reviewed: I have personally reviewed following labs and reports of the imaging studies  CBC: Recent Labs  Lab 12/09/23 1928 12/10/23 0012 12/10/23 0420 12/10/23 1219 12/11/23 0325  WBC 7.6 7.1 7.8 8.0 19.8*  NEUTROABS  --   --   --   --  16.9*  HGB 6.4* 7.9* 10.3* 10.9* 9.7*  HCT 21.6* 25.5* 31.5* 33.7* 29.8*  MCV 69.9* 73.7* 73.4* 73.4* 73.2*  PLT 380 296 342 359 317    Basic Metabolic Panel: Recent Labs  Lab 12/09/23 1338 12/10/23 0420 12/11/23 0325  NA 140 140 139  K 4.0 3.8 3.9  CL 111 109 110  CO2 19* 22 21*  GLUCOSE 157* 107* 102*  BUN 19 14 12   CREATININE 1.35* 1.15* 1.50*  CALCIUM  9.5 9.1 8.6*  MG  --   --  1.7    GFR: Estimated Creatinine Clearance: 35 mL/min (A) (by C-G formula based on SCr of 1.5 mg/dL (H)).  Liver Function Tests: Recent Labs  Lab 12/09/23 1338 12/11/23 0325  AST 22 18  ALT 20 15  ALKPHOS 72 56  BILITOT 0.5 0.6  PROT 7.1 5.7*  ALBUMIN  3.7 2.7*    Coagulation Profile: Recent Labs  Lab 12/09/23 1532 12/09/23 1928  INR 1.0 1.0    CBG: Recent Labs  Lab 12/10/23 1323  GLUCAP 130*   Anemia Panel: Recent Labs    12/09/23 1928  VITAMINB12 517  FOLATE >20.0  FERRITIN 4*  TIBC 438  IRON  18*  RETICCTPCT 2.4    Recent Results (from the past 240 hours)  Culture, blood (Routine X 2) w Reflex to ID Panel  Status: None (Preliminary result)   Collection Time: 12/10/23  6:39 PM   Specimen: BLOOD  Result Value Ref Range Status   Specimen Description BLOOD SITE NOT SPECIFIED  Final   Special Requests   Final    BOTTLES DRAWN AEROBIC AND ANAEROBIC Blood Culture results may not be optimal due to an inadequate volume of blood received in culture bottles   Culture   Final    NO GROWTH < 12  HOURS Performed at [facility], [address] [place], [place] [address]    Report Status PENDING  Incomplete  Culture, blood (Routine X 2) w Reflex to ID Panel     Status: None (Preliminary result)   Collection Time: 12/10/23  6:42 PM   Specimen: BLOOD  Result Value Ref Range Status   Specimen Description BLOOD SITE NOT SPECIFIED  Final   Special Requests   Final    BOTTLES DRAWN AEROBIC AND ANAEROBIC Blood Culture results may not be optimal due to an inadequate volume of blood received in culture bottles   Culture   Final    NO GROWTH < 12 HOURS Performed at [facility], [address] [place], [place] [address]    Report Status PENDING  Incomplete      Radiology Studies: DG Abd Portable 1V Result Date: 12/10/2023 CLINICAL DATA:  Intractable nausea and vomiting. EXAM: PORTABLE ABDOMEN - 1 VIEW COMPARISON:  CT abdomen and pelvis 04/05/2023 FINDINGS: Scattered stool throughout the colon. No small or large bowel distention. Linear foreign bodies projecting over the upper abdomen likely representing postoperative change or surface structures. No radiopaque stones. Degenerative changes in the spine and hips. Soft tissue contours appear intact. Visualized lung bases are clear. IMPRESSION: Normal nonobstructive bowel gas pattern. Electronically Signed   By: Elsie Gravely M.D.   On: 12/10/2023 20:08   CT HEAD WO CONTRAST ( ) Result Date: 12/09/2023 EXAM: CT HEAD WITHOUT CONTRAST 12/09/2023 09:14:04 PM TECHNIQUE: CT of the head was performed without the administration of intravenous contrast. Automated exposure control, iterative reconstruction, and/or weight based adjustment of the mA/kV was utilized to reduce the radiation dose to as low as reasonably achievable. COMPARISON: CT Head dated 08/11/2023. CLINICAL HISTORY: Syncope/presyncope, suspected cerebrovascular cause. FINDINGS: BRAIN AND VENTRICLES: No acute hemorrhage, no evidence of acute infarct, no hydrocephalus, and no  extra-axial collection or mass effect. ORBITS: No acute abnormality. SINUSES: No acute abnormality. SOFT TISSUES AND SKULL: No acute soft tissue abnormality and no skull fracture. IMPRESSION: 1. No acute intracranial abnormality. Electronically signed by: Franky Stanford MD 12/09/2023 11:05 PM EDT RP Workstation: [id number]       LOS: 1 day   Joette Pebbles  [facility] Pager on [email or web address]  12/11/2023, 9:25 AM

## 2023-12-11 NOTE — Evaluation (Signed)
 Physical Therapy Evaluation Patient Details Name: Judy Carlson MRN: [id number] DOB: Oct 24, 1950 Today's Date: 12/11/2023  History of Present Illness  73 yo female present with N/V and syncope. Pt found to have upper GIB. PMh HTN COPD GIB HLD CAD GERD 07/25/23 R transcarotid revascularization repair R CEA 2020 Raynaurds R CVA CKDIII PVD  Clinical Impression  Patient presents with mobility close to her baseline though with HR into 120's with ambulation.  She denied symptoms though initiated education on energy conservation.  She normally completes ADL/IADL's on her own and prefers not to be reliant on her niece to help.  She will benefit from skilled PT in the acute setting to address mobility and stairs prior to home.  No c/o dizziness though not specifically tested.  Will complete vestibular evaluation as needed next session.         If plan is discharge home, recommend the following:     Can travel by private vehicle        Equipment Recommendations None recommended by PT  Recommendations for Other Services       Functional Status Assessment Patient has had a recent decline in their functional status and demonstrates the ability to make significant improvements in function in a reasonable and predictable amount of time.     Precautions / Restrictions Precautions Precautions: Fall      Mobility  Bed Mobility Overal bed mobility: Independent                  Transfers Overall transfer level: Independent                      Ambulation/Gait Ambulation/Gait assistance: Supervision, Contact guard assist Gait Distance (Feet): 200 Feet Assistive device: IV Pole Gait Pattern/deviations: Step-through pattern, Decreased stride length, Wide base of support       General Gait Details: pushing IV pole per pt preference, A for safety initially  Stairs            Wheelchair Mobility     Tilt Bed    Modified Rankin (Stroke Patients Only)        Balance Overall balance assessment: Needs assistance   Sitting balance-Leahy Scale: Good       Standing balance-Leahy Scale: Good Standing balance comment: static standing without UE support, though with mobility preferring to hold on to IV pole                             Pertinent Vitals/Pain Pain Assessment Pain Assessment: No/denies pain    Home Living Family/patient expects to be discharged to:: Private residence Living Arrangements: Other relatives (neice) Available Help at Discharge: Available 24 hours/day Type of Home: House Home Access: Stairs to enter Entrance Stairs-Rails: None     Home Layout: One level Home Equipment: Agricultural consultant (2 wheels) Additional Comments: takes tub baths    Prior Function Prior Level of Function : Independent/Modified Independent;Driving             Mobility Comments: independent ADLs Comments: independent     Extremity/Trunk Assessment   Upper Extremity Assessment Upper Extremity Assessment: Defer to OT evaluation    Lower Extremity Assessment Lower Extremity Assessment: Overall WFL for tasks assessed    Cervical / Trunk Assessment Cervical / Trunk Assessment: Normal  Communication        Cognition Arousal: Alert Behavior During Therapy: [facility] for tasks assessed/performed  Cueing       General Comments General comments (skin integrity, edema, etc.): HR 115 on EOB, up to 128 with ambulation, though no c/o of symptoms; initiated education on energy conservation prior to handoff to OT    Exercises     Assessment/Plan    PT Assessment Patient needs continued PT services  PT Problem List Decreased activity tolerance;Decreased balance;Cardiopulmonary status limiting activity       PT Treatment Interventions Gait training;Stair training;Therapeutic activities;Functional mobility training;Balance training    PT Goals (Current goals can be found in  the Care Plan section)  Acute Rehab PT Goals Patient Stated Goal: return home to independent PT Goal Formulation: With patient Time For Goal Achievement: 12/25/23 Potential to Achieve Goals: Good    Frequency Min 2X/week     Co-evaluation               AM-PAC PT 6 Clicks Mobility  Outcome Measure Help needed turning from your back to your side while in a flat bed without using bedrails?: None Help needed moving from lying on your back to sitting on the side of a flat bed without using bedrails?: None Help needed moving to and from a bed to a chair (including a wheelchair)?: None Help needed standing up from a chair using your arms (e.g., wheelchair or bedside chair)?: None Help needed to walk in hospital room?: A Little Help needed climbing 3-5 steps with a railing? : A Little 6 Click Score: 22    End of Session Equipment Utilized During Treatment: Gait belt Activity Tolerance: Patient tolerated treatment well Patient left: in bed;Other (comment) (handoff to OT)   PT Visit Diagnosis: Other abnormalities of gait and mobility (R26.89)    Time: 9062-9044 PT Time Calculation (min) (ACUTE ONLY): 18 min   Charges:   PT Evaluation $PT Eval Low Complexity: 1 Low   PT General Charges $$ ACUTE PT VISIT: 1 Visit         Judy Carlson, PT Acute Rehabilitation Services Office:[phone] 12/11/2023   Judy Carlson 12/11/2023, 12:43 PM

## 2023-12-11 NOTE — Plan of Care (Signed)

## 2023-12-11 NOTE — Evaluation (Signed)
 Occupational Therapy Evaluation Patient Details Name: Judy Carlson MRN: [id number] DOB: 12/05/1950 Today's Date: 12/11/2023   History of Present Illness   73 yo female present with N/V and syncope. Pt found to have upper GIB. PMh HTN COPD GIB HLD CAD GERD 07/25/23 R transcarotid revascularization repair R CEA 2020 Raynaurds R CVA CKDIII PVD     Clinical Impressions Patient evaluated by Occupational Therapy with no further acute OT needs identified. All education has been completed and the patient has no further questions. See below for any follow-up Occupational Therapy or equipment needs. OT to sign off. Thank you for referral.       If plan is discharge home, recommend the following:         Functional Status Assessment   Patient has had a recent decline in their functional status and demonstrates the ability to make significant improvements in function in a reasonable and predictable amount of time.     Equipment Recommendations   None recommended by OT     Recommendations for Other Services         Precautions/Restrictions   Precautions Precautions: Fall Restrictions Weight Bearing Restrictions Per Provider Order: No     Mobility Bed Mobility               General bed mobility comments: oob with PT    Transfers Overall transfer level: Independent                        Balance                                           ADL either performed or assessed with clinical judgement   ADL Overall ADL's : Independent                                       General ADL Comments: able to figure 4 cross for bil LE ,     Vision Baseline Vision/History: 1 Wears glasses Patient Visual Report: No change from baseline Vision Assessment?: No apparent visual deficits     Perception         Praxis         Pertinent Vitals/Pain Pain Assessment Pain Assessment: No/denies pain     Extremity/Trunk  Assessment Upper Extremity Assessment Upper Extremity Assessment: Overall WFL for tasks assessed   Lower Extremity Assessment Lower Extremity Assessment: Overall WFL for tasks assessed   Cervical / Trunk Assessment Cervical / Trunk Assessment: Normal   Communication Communication Communication: No apparent difficulties   Cognition Arousal: Alert Behavior During Therapy: WFL for tasks assessed/performed Cognition: No apparent impairments                               Following commands: Intact       Cueing  General Comments      increased HR with activity max 120s without any self awareness to change. PT educated on energy conservation strategies   Exercises     Shoulder Instructions      Home Living Family/patient expects to be discharged to:: Private residence Living Arrangements: Other relatives (neice) Available Help at Discharge: Available 24 hours/day Type of Home: House Home Access: Stairs to enter  Entrance Stairs-Number of Steps: 3 Entrance Stairs-Rails: None Home Layout: One level     Bathroom Shower/Tub: Walk-in shower;Tub/shower unit   Bathroom Toilet: Handicapped height Bathroom Accessibility: Yes How Accessible: Accessible via walker Home Equipment: Rolling Walker (2 wheels)   Additional Comments: takes tub baths      Prior Functioning/Environment Prior Level of Function : Independent/Modified Independent;Driving             Mobility Comments: independent ADLs Comments: independent    OT Problem List:     OT Treatment/Interventions:        OT Goals(Current goals can be found in the care plan section)   Acute Rehab OT Goals Patient Stated Goal: to return home   OT Frequency:       Co-evaluation              AM-PAC OT 6 Clicks Daily Activity     Outcome Measure Help from another person eating meals?: None Help from another person taking care of personal grooming?: None Help from another person  toileting, which includes using toliet, bedpan, or urinal?: None Help from another person bathing (including washing, rinsing, drying)?: None Help from another person to put on and taking off regular upper body clothing?: None Help from another person to put on and taking off regular lower body clothing?: None 6 Click Score: 24   End of Session Nurse Communication: Mobility status  Activity Tolerance: Patient tolerated treatment well Patient left: in chair;with call bell/phone within reach;with chair alarm set  OT Visit Diagnosis: Unsteadiness on feet (R26.81)                Time: 9043-8988 OT Time Calculation (min): 15 min Charges:  OT General Charges $OT Visit: 1 Visit OT Evaluation $OT Eval Low Complexity: 1 Low   Brynn, OTR/L  Acute Rehabilitation Services Office: [phone] .   Ely Molt 12/11/2023, 10:35 AM

## 2023-12-12 ENCOUNTER — Telehealth: Payer: Self-pay

## 2023-12-12 ENCOUNTER — Other Ambulatory Visit: Payer: Self-pay

## 2023-12-12 DIAGNOSIS — R059 Cough, unspecified: Secondary | ICD-10-CM | POA: Diagnosis not present

## 2023-12-12 DIAGNOSIS — K31819 Angiodysplasia of stomach and duodenum without bleeding: Secondary | ICD-10-CM | POA: Diagnosis not present

## 2023-12-12 DIAGNOSIS — D72819 Decreased white blood cell count, unspecified: Secondary | ICD-10-CM | POA: Diagnosis not present

## 2023-12-12 DIAGNOSIS — Z862 Personal history of diseases of the blood and blood-forming organs and certain disorders involving the immune mechanism: Secondary | ICD-10-CM

## 2023-12-12 DIAGNOSIS — K5521 Angiodysplasia of colon with hemorrhage: Secondary | ICD-10-CM | POA: Diagnosis not present

## 2023-12-12 DIAGNOSIS — D649 Anemia, unspecified: Secondary | ICD-10-CM | POA: Diagnosis not present

## 2023-12-12 LAB — BASIC METABOLIC PANEL WITH GFR
Anion gap: 12 (ref 5–15)
BUN: 12 mg/dL (ref 8–23)
CO2: 20 mmol/L — ABNORMAL LOW (ref 22–32)
Calcium: 8.9 mg/dL (ref 8.9–10.3)
Chloride: 109 mmol/L (ref 98–111)
Creatinine, Ser: 1.51 mg/dL — ABNORMAL HIGH (ref 0.44–1.00)
GFR, Estimated: 36 mL/min — ABNORMAL LOW (ref >60)
Glucose, Bld: 95 mg/dL (ref 70–99)
Potassium: 3.3 mmol/L — ABNORMAL LOW (ref 3.5–5.1)
Sodium: 141 mmol/L (ref 135–145)

## 2023-12-12 LAB — CBC
HCT: 30.4 % — ABNORMAL LOW (ref 36.0–46.0)
Hemoglobin: 9.8 g/dL — ABNORMAL LOW (ref 12.0–15.0)
MCH: 23.8 pg — ABNORMAL LOW (ref 26.0–34.0)
MCHC: 32.2 g/dL (ref 30.0–36.0)
MCV: 73.8 fL — ABNORMAL LOW (ref 80.0–100.0)
Platelets: 318 K/uL (ref 150–400)
RBC: 4.12 MIL/uL (ref 3.87–5.11)
RDW: 21.4 % — ABNORMAL HIGH (ref 11.5–15.5)
WBC: 15 K/uL — ABNORMAL HIGH (ref 4.0–10.5)
nRBC: 0 % (ref 0.0–0.2)

## 2023-12-12 MED ORDER — CLOPIDOGREL BISULFATE 75 MG PO TABS: 75.0000 mg | ORAL_TABLET | Freq: Every day | ORAL | 3 refills | Status: AC

## 2023-12-12 MED ORDER — ASPIRIN 81 MG PO TBEC: 81.0000 mg | DELAYED_RELEASE_TABLET | Freq: Every day | ORAL | Status: AC

## 2023-12-12 MED ORDER — PANTOPRAZOLE SODIUM 40 MG PO TBEC: DELAYED_RELEASE_TABLET | ORAL | 1 refills | Status: DC

## 2023-12-12 MED ORDER — CEFDINIR 300 MG PO CAPS: 300.0000 mg | ORAL_CAPSULE | Freq: Two times a day (BID) | ORAL | 0 refills | Status: AC

## 2023-12-12 MED ORDER — FERROUS SULFATE 325 (65 FE) MG PO TABS: 325.0000 mg | ORAL_TABLET | Freq: Every day | ORAL | 3 refills | Status: DC

## 2023-12-12 MED ORDER — MAGNESIUM SULFATE 2 GM/50ML IV SOLN
2.0000 g | Freq: Once | INTRAVENOUS | Status: AC
  Administered 2023-12-12: 2 g via INTRAVENOUS
  Filled 2023-12-12: qty 50

## 2023-12-12 MED ORDER — POTASSIUM CHLORIDE CRYS ER 20 MEQ PO TBCR
40.0000 meq | EXTENDED_RELEASE_TABLET | Freq: Once | ORAL | Status: AC
  Administered 2023-12-12: 40 meq via ORAL
  Filled 2023-12-12: qty 2

## 2023-12-12 NOTE — Discharge Summary (Signed)
 [facility]  Physician Discharge Summary   Patient ID: Judy Carlson MRN: [id number] DOB/AGE: October 19, 1950 73 y.o.  Admit date: 12/09/2023 Discharge date: 12/12/2023  ***  PCP: Garald Karlynn GAILS, MD  DISCHARGE DIAGNOSES:  Active Problems:   Essential hypertension, benign   Anxiety and depression   COPD (chronic obstructive pulmonary disease) (HCC)   DNR (do not resuscitate)   Polyarthralgia   Black stools   Carotid artery thrombosis, right   Syncopal episodes   Upper GI bleed   Intractable nausea and vomiting   AVM (arteriovenous malformation) of small bowel, acquired with hemorrhage   Gastric AVM   Antiplatelet or antithrombotic long-term use   RECOMMENDATIONS FOR OUTPATIENT FOLLOW UP: *** (include homehealth, outpatient follow-up instructions, specific recommendations for PCP to follow-up on, etc.)   Home Health:***  Equipment/Devices:***   CODE STATUS:***   DISCHARGE CONDITION: {condition:18240}  Diet recommendation: ***  INITIAL HISTORY: ***  Consultations: ***  Procedures: *** (i.e. Studies not automatically included, echos, thoracentesis, etc; not x-rays)  HOSPITAL COURSE: ***         Estimated body mass index is 29.45 kg/m as calculated from the following:   Height as of this encounter: 5' 5 (1.651 m).   Weight as of this encounter: 80.3 kg.              PERTINENT LABS:  The results of significant diagnostics from this hospitalization (including imaging, microbiology, ancillary and laboratory) are listed below for reference.    Microbiology: Recent Results (from the past 240 hours)  Culture, blood (Routine X 2) w Reflex to ID Panel     Status: None (Preliminary result)   Collection Time: 12/10/23  6:39 PM   Specimen: BLOOD  Result Value Ref Range Status   Specimen Description BLOOD SITE NOT SPECIFIED  Final   Special Requests   Final    BOTTLES DRAWN AEROBIC AND ANAEROBIC Blood Culture results may not be optimal  due to an inadequate volume of blood received in culture bottles   Culture   Final    NO GROWTH 2 DAYS Performed at [facility], [address], [place], [place] [address]    Report Status PENDING  Incomplete  Culture, blood (Routine X 2) w Reflex to ID Panel     Status: None (Preliminary result)   Collection Time: 12/10/23  6:42 PM   Specimen: BLOOD  Result Value Ref Range Status   Specimen Description BLOOD SITE NOT SPECIFIED  Final   Special Requests   Final    BOTTLES DRAWN AEROBIC AND ANAEROBIC Blood Culture results may not be optimal due to an inadequate volume of blood received in culture bottles   Culture   Final    NO GROWTH 2 DAYS Performed at [facility], [address] [place], [place] [address]    Report Status PENDING  Incomplete     Labs:   Basic Metabolic Panel: Recent Labs  Lab 12/09/23 1338 12/10/23 0420 12/11/23 0325 12/12/23 0314  NA 140 140 139 141  K 4.0 3.8 3.9 3.3*  CL 111 109 110 109  CO2 19* 22 21* 20*  GLUCOSE 157* 107* 102* 95  BUN 19 14 12 12   CREATININE 1.35* 1.15* 1.50* 1.51*  CALCIUM  9.5 9.1 8.6* 8.9  MG  --   --  1.7  --    Liver Function Tests: Recent Labs  Lab 12/09/23 1338 12/11/23 0325  AST 22 18  ALT 20 15  ALKPHOS 72 56  BILITOT 0.5 0.6  PROT 7.1 5.7*  ALBUMIN  3.7 2.7*   No results for input(s): LIPASE, AMYLASE in the last 168 hours. No results for input(s): AMMONIA in the last 168 hours. CBC: Recent Labs  Lab 12/10/23 0012 12/10/23 0420 12/10/23 1219 12/11/23 0325 12/12/23 0314  WBC 7.1 7.8 8.0 19.8* 15.0*  NEUTROABS  --   --   --  16.9*  --   HGB 7.9* 10.3* 10.9* 9.7* 9.8*  HCT 25.5* 31.5* 33.7* 29.8* 30.4*  MCV 73.7* 73.4* 73.4* 73.2* 73.8*  PLT 296 342 359 317 318   Cardiac Enzymes: No results for input(s): CKTOTAL, CKMB, CKMBINDEX, TROPONINI in the last 168 hours. BNP: BNP (last 3 results) No results for input(s): BNP in the last 8760 hours.  ProBNP (last 3  results) No results for input(s): PROBNP in the last 8760 hours.  CBG: Recent Labs  Lab 12/10/23 1323  GLUCAP 130*     IMAGING STUDIES DG Abd Portable 1V Result Date: 12/10/2023 CLINICAL DATA:  Intractable nausea and vomiting. EXAM: PORTABLE ABDOMEN - 1 VIEW COMPARISON:  CT abdomen and pelvis 04/05/2023 FINDINGS: Scattered stool throughout the colon. No small or large bowel distention. Linear foreign bodies projecting over the upper abdomen likely representing postoperative change or surface structures. No radiopaque stones. Degenerative changes in the spine and hips. Soft tissue contours appear intact. Visualized lung bases are clear. IMPRESSION: Normal nonobstructive bowel gas pattern. Electronically Signed   By: Elsie Gravely M.D.   On: 12/10/2023 20:08   CT HEAD WO CONTRAST ( ) Result Date: 12/09/2023 EXAM: CT HEAD WITHOUT CONTRAST 12/09/2023 09:14:04 PM TECHNIQUE: CT of the head was performed without the administration of intravenous contrast. Automated exposure control, iterative reconstruction, and/or weight based adjustment of the mA/kV was utilized to reduce the radiation dose to as low as reasonably achievable. COMPARISON: CT Head dated 08/11/2023. CLINICAL HISTORY: Syncope/presyncope, suspected cerebrovascular cause. FINDINGS: BRAIN AND VENTRICLES: No acute hemorrhage, no evidence of acute infarct, no hydrocephalus, and no extra-axial collection or mass effect. ORBITS: No acute abnormality. SINUSES: No acute abnormality. SOFT TISSUES AND SKULL: No acute soft tissue abnormality and no skull fracture. IMPRESSION: 1. No acute intracranial abnormality. Electronically signed by: Franky Stanford MD 12/09/2023 11:05 PM EDT RP Workstation: [id number]   CT CORONARY MORPH W/CTA COR W/SCORE DEL W/CM &/OR WO/CM Addendum Date: 12/07/2023 ADDENDUM REPORT: 12/07/2023 13:54 EXAM: OVER-READ INTERPRETATION  CT CHEST The following report is an over-read performed by radiologist Dr. Andrea Gasman of  [facility] on 12/07/2023. This over-read does not include interpretation of cardiac or coronary anatomy or pathology. The coronary CTA interpretation by the cardiologist is attached. COMPARISON:  Lung cancer screening chest CT 07/19/2023 FINDINGS: Vascular: Aortic atherosclerosis, including irregular plaque in the transverse aorta. The included aorta is normal in caliber. Mediastinum/nodes: No adenopathy or mass. Unremarkable esophagus. Lungs: No focal airspace disease. Unchanged right middle lobe solid pulmonary nodule. The additional pulmonary nodules on prior grossly stable but not well-defined. No new pulmonary nodule. No pleural fluid. The included airways are patent. Upper abdomen: No acute findings. Tiny hypodensities in the dome of the liver too small to characterize but likely cysts. Musculoskeletal: There are no acute or suspicious osseous abnormalities. IMPRESSION: 1. No acute extracardiac findings. 2. Unchanged right middle lobe solid pulmonary nodule. The additional pulmonary nodules on prior grossly stable but not well-defined. Recommend continued annual lung cancer screening. Aortic Atherosclerosis (ICD10-I70.0). Electronically Signed   By: Andrea Gasman M.D.   On: 12/07/2023 13:54   Result  Date: 12/07/2023 HISTORY: Shortness of breath and decreased endurance EXAM: Cardiac/Coronary  CT PROTOCOL: A non-contrast, gated CT scan was obtained with axial slices of 2.5 mm through the heart for calcium  scoring. Calcium  scoring was performed using the Agatston method. A 120 kV prospective, gated, contrast cardiac CT scan was obtained. Gantry rotation speed was 230 msec and collimation was 0.63 mm. Two sublingual nitroglycerin  tablets (0.8 mg) were given. The 3D data set was reconstructed with motion correction for the best systolic or diastolic phase. Images were analyzed on a dedicated workstation using MPR, MIP, and VRT modes. The patient received 95 cc of contrast. FINDINGS: Image quality:  excellent. Artifact: Limited. Coronary calcium  score is 841, which places the patient in the 96th percentile for age and sex matched control. Coronary arteries: Normal coronary origins.  Right dominance. Left Main Coronary Artery: Normal caliber vessel, originates from the left coronary cusp, bifurcates to form a left anterior descending artery (LAD) and a left circumflex artery (LCX). Minimal mixed plaque (< 24%) distal left main. Left Anterior Descending Artery: Normal caliber vessel, wraps the apex, gives off 2 diagonal branches. Moderate stenosis (50-69%) proximal to mid LAD (at the takeoff of second diagonal branch) due to mixed plaque. First Diagonal branch: Patent Second Diagonal branch: Moderate stenosis (50-69%) proximal D2 due to mixed plaque. Left Circumflex Artery: Normal caliber vessel, non-dominant, travels within the atrioventricular groove, gives off 2 obtuse marginal branches. Mild calcified plaque (25-49%) mid LCX. First Obtuse Marginal branch: Not well-visualized Second Obtuse Marginal branch: Mild stenosis (25-49%) mixed plaque at the proximal OM2. Right Coronary Artery: Dominant vessel, originates from the right coronary cusp, terminates as a PDA and right posterolateral branch. Mild stenosis (25-49%) at the proximal RCA due to mixed plaque. Severe stenosis (70-99%) closer to 70% at the proximal to mid RCA due to soft plaque. Mild mixed plaque 25-49% at the mid RCA. Remainder of the vessel is patent. Aorta: Normal size, 32 mm at the mid ascending aorta (level of the PA bifurcation) measured double oblique. Diffuse aortic atherosclerosis. Aortic Valve: Native valve, trileaflet aortic valve, aortic valve sclerosis. Mitral valve: Native valve, no significant mitral annular calcification. Other findings: Normal pulmonary vein drainage into the left atrium. Normal left atrial appendage without thrombus. Normal size of the pulmonary artery. Please see separate report from [facility] for  non-cardiac findings. IMPRESSION: 1. Coronary calcium  score of 841. This was 96th percentile for age and sex matched control. 2. Normal coronary origins with right dominance. 3. CAD-RADS 4 severe coronary artery disease. 4. Moderate stenosis (50-69%) proximal to mid LAD (at the takeoff of second diagonal branch) due to mixed plaque. 5. Moderate stenosis (50-69%) proximal D2 due to mixed plaque. 6. Mild stenosis (25-49%) mixed plaque at the proximal OM2. 7. Severe stenosis (70-99%) closer to 70% at the proximal to mid RCA due to soft plaque. 8. CT FFR will be performed and reported separately to evaluate RCA, LAD, and second diagonal branch. 9. Diffuse aortic atherosclerosis. 10. Aortic valve sclerosis, recommend echocardiography to evaluate for valvular heart disease. RECOMMENDATION: Await CT FFR results. Consider symptom-guided anti-ischemic pharmacotherapy as well as risk factor modification per guideline directed care. Consider up titration of lipid-lowering therapy. Electronically Signed: By: Madonna Large On: 11/29/2023 13:11   CT CORONARY FFR DATA PREP & FLUID ANALYSIS Result Date: 11/29/2023 EXAM: CT FFR ANALYSIS CLINICAL DATA:  Shortness of breath and decreased endurance FINDINGS: FFRct analysis was performed on the original cardiac CT angiogram dataset. Diagrammatic representation of the FFRct analysis  is provided in a separate PDF document in PACS. This dictation was created using the PDF document and an interactive 3D model of the results. 3D model is not available in the EMR/PACS. Normal FFR range is >0.80. Indeterminate (grey) zone is 0.76-0.80. FFR delta of 0.13 is considered significant. 1. Left Main: FFR = 0.97 2. LAD: Proximal FFR = 0.97, Mid FFR = 0.91, distal FFR = 0.87 3. D1: Proximal FFR = 0.92, Distal FFR = not modeled 4. D2: Proximal FFR = 0.92, distal FFR = 0.79 5. LCX: Proximal FFR = 0.97, distal FFR = 0.94 6. OM1: Not modeled 7. OM2:Proximal FFR = 0.94, distal FFR = not modeled 8. RCA:  Proximal FFR = 0.99, mid FFR =0.82, Distal FFR = 0.81 IMPRESSION: 1. CT FFR analysis showed significant stenosis in proximal/mid RCA and proximal/mid second diagonal branch based on translational difference in FFR values. CCTA illustrates disease in the respective segments. RECOMMENDATIONS: Guideline-directed medical therapy and aggressive risk factor modification for secondary prevention of coronary artery disease. Consider coronary angiography for symptomatic disease or if refractory to medical therapy (if no contraindications). Clinical correlation required. Electronically Signed   By: Madonna Large   On: 11/29/2023 13:21    DISCHARGE EXAMINATION: Vitals:   12/11/23 2105 12/12/23 0000 12/12/23 0400 12/12/23 0813  BP: 139/72 (!) 164/81 130/65 (!) 151/71  Pulse: (!) 106 95 85 92  Resp: 15 18 18 18   Temp: 98.3 F (36.8 C) 98.3 F (36.8 C) 98.9 F (37.2 C) 98.7 F (37.1 C)  TempSrc: Oral Oral Oral Oral  SpO2:  93% 94% 97%  Weight:      Height:       {physical exam:3041130}  DISPOSITION: ***  Discharge Instructions     Amb Referral to Intravenous Iron  Therapy   Complete by: As directed    You have been referred to [facility]fusion team for IV Iron  Infusions. The infusion pharmacy team will reach out to you with appointment information.    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)   Secondary diagnosis code for IV iron : Other   Comment: hematemesis   Call MD for:  difficulty breathing, headache or visual disturbances   Complete by: As directed    Call MD for:  extreme fatigue   Complete by: As directed    Call MD for:  persistant dizziness or light-headedness   Complete by: As directed    Call MD for:  persistant nausea and vomiting   Complete by: As directed    Call MD for:  severe uncontrolled pain   Complete by: As directed    Call MD for:  temperature >100.4   Complete by: As directed    Diet - low sodium heart  healthy   Complete by: As directed    Discharge instructions   Complete by: As directed    Please take your medications as prescribed.  GI will arrange follow-up.  Seek attention if you see blood in the stool or black-colored stool.  You were cared for by a hospitalist during your hospital stay. If you have any questions about your discharge medications or the care you received while you were in the hospital after you are discharged, you can call the unit and asked to speak with the hospitalist on call if the hospitalist that took care of you is not available. Once you are discharged, your primary care physician will handle any further medical issues. Please note  that NO REFILLS for any discharge medications will be authorized once you are discharged, as it is imperative that you return to your primary care physician (or establish a relationship with a primary care physician if you do not have one) for your aftercare needs so that they can reassess your need for medications and monitor your lab values. If you do not have a primary care physician, you can call [phone] for a physician referral.   Increase activity slowly   Complete by: As directed         Current Inpatient Medications: {medication reviewed/display:3041432}  Allergies as of 12/12/2023       Reactions   Crestor  [rosuvastatin  Calcium ] Other (See Comments)   myalgias   Lipitor [atorvastatin ] Other (See Comments)   Myalgias/feet pain   Lisinopril Swelling, Other (See Comments)   Tongue swelling; facial swelling looks like she was hit in the face.   Naproxen Other (See Comments)   Advised not to take due to BP meds   Nsaids Other (See Comments)   Pt advised not to take due to blood pressure medications    Pravastatin  Other (See Comments)   Myalgias.   Prednisone  Itching, Swelling, Other (See Comments)   Toe swelling    Amoxicillin  Anxiety, Cough   Clindamycin /lincomycin Rash   Doxycycline Other (See Comments)   dizziness    Lovastatin  Hives, Rash, Other (See Comments)   Pt reports issues with her veins.        Medication List     TAKE these medications    acetaminophen  500 MG tablet Commonly known as: TYLENOL  Take 1,000 mg by mouth as needed for mild pain (pain score 1-3), headache or fever.   amLODipine -valsartan  5-160 MG tablet Commonly known as: Exforge  Take 1 tablet by mouth 2 (two) times daily.   aspirin  EC 81 MG tablet Take 1 tablet (81 mg total) by mouth daily at 6 (six) AM. Please resume on 12/16/2023 Start taking on: December 16, 2023 What changed:  additional instructions These instructions start on December 16, 2023. If you are unsure what to do until then, ask your doctor or other care provider.   cefdinir 300 MG capsule Commonly known as: OMNICEF Take 1 capsule (300 mg total) by mouth 2 (two) times daily for 5 days.   clopidogrel  75 MG tablet Commonly known as: PLAVIX  Take 1 tablet (75 mg total) by mouth daily. Please resume on 12/14/2023 Start taking on: December 14, 2023 What changed:  additional instructions These instructions start on December 14, 2023. If you are unsure what to do until then, ask your doctor or other care provider.   escitalopram  5 MG tablet Commonly known as: Lexapro  Take 1 tablet (5 mg total) by mouth daily.   ezetimibe  10 MG tablet Commonly known as: ZETIA  Take 1 tablet (10 mg total) by mouth daily.   ferrous sulfate  325 (65 FE) MG tablet Take 1 tablet (325 mg total) by mouth daily with breakfast. Start taking on: December 13, 2023   furosemide  20 MG tablet Commonly known as: LASIX  TAKE 1 TABLET BY MOUTH EVERY DAY   Klor-Con  M10 10 MEQ tablet Generic drug: potassium chloride  Take 1 mEq by mouth daily.   metoprolol  succinate 100 MG 24 hr tablet Commonly known as: TOPROL -XL TAKE 1 TABLET DAILY WITH OR IMMEDIATELY FOLLOWING A MEAL.   multivitamin tablet Take 1 tablet by mouth daily.   pantoprazole  40 MG tablet Commonly known as:  PROTONIX  Take 40 mg twice a day for 2 weeks  and then once a day What changed:  how much to take how to take this when to take this additional instructions   PATADAY  OP Place 1 drop into both eyes daily as needed (For dry eyes).   Repatha  SureClick 140 MG/ML Soaj Generic drug: Evolocumab  Inject 140 mg into the skin every 14 (fourteen) days.   Vitamin D3 50 MCG (2000 UT) capsule TAKE 1 CAPSULE (2,000 UNITS TOTAL) BY MOUTH DAILY.          Follow-up Information     Plotnikov, Karlynn GAILS, MD. Schedule an appointment as soon as possible for a visit in 1 week(s).   Specialty: Internal Medicine Why: post hospitalization follow up Contact information: [address] [place] [address] [phone]         [facility] (Formerly Follow up.   Why: [facility] will provide home health services.  They will call you in the next 24-48 hours to set up services. Contact information: [address] [place] [address] [phone]                 TOTAL DISCHARGE TIME: ***  Joette Pebbles  [facility] Pager on [email or web address]  12/12/2023, 11:03 AM

## 2023-12-12 NOTE — Progress Notes (Signed)
 Progress Note   Subjective  She is feeling well. Eating okay, no further bleeding. No fevers but has had some cough. WBC trending down.    Objective   Vital signs in last 24 hours: Temp:  [98.3 F (36.8 C)-99.3 F (37.4 C)] 98.9 F (37.2 C) (10/09 0400) Pulse Rate:  [64-106] 85 (10/09 0400) Resp:  [15-18] 18 (10/09 0400) BP: (102-164)/(61-81) 130/65 (10/09 0400) SpO2:  [93 %-100 %] 94 % (10/09 0400) Last BM Date : 12/09/23 General:    AA female in NAD Neurologic:  Alert and oriented,  grossly normal neurologically. Psych:  Cooperative. Normal mood and affect.  Intake/Output from previous day: 10/08 0701 - 10/09 0700 In: 1615.5 [P.O.:360; I.V.:1055.5; IV Piggyback:200] Out: -  Intake/Output this shift: No intake/output data recorded.  Lab Results: Recent Labs    12/10/23 1219 12/11/23 0325 12/12/23 0314  WBC 8.0 19.8* 15.0*  HGB 10.9* 9.7* 9.8*  HCT 33.7* 29.8* 30.4*  PLT 359 317 318   BMET Recent Labs    12/10/23 0420 12/11/23 0325 12/12/23 0314  NA 140 139 141  K 3.8 3.9 3.3*  CL 109 110 109  CO2 22 21* 20*  GLUCOSE 107* 102* 95  BUN 14 12 12   CREATININE 1.15* 1.50* 1.51*  CALCIUM  9.1 8.6* 8.9   LFT Recent Labs    12/11/23 0325  PROT 5.7*  ALBUMIN  2.7*  AST 18  ALT 15  ALKPHOS 56  BILITOT 0.6   PT/INR Recent Labs    12/09/23 1532 12/09/23 1928  LABPROT 13.3 14.2  INR 1.0 1.0    Studies/Results: DG Abd Portable 1V Result Date: 12/10/2023 CLINICAL DATA:  Intractable nausea and vomiting. EXAM: PORTABLE ABDOMEN - 1 VIEW COMPARISON:  CT abdomen and pelvis 04/05/2023 FINDINGS: Scattered stool throughout the colon. No small or large bowel distention. Linear foreign bodies projecting over the upper abdomen likely representing postoperative change or surface structures. No radiopaque stones. Degenerative changes in the spine and hips. Soft tissue contours appear intact. Visualized lung bases are clear. IMPRESSION: Normal nonobstructive  bowel gas pattern. Electronically Signed   By: Elsie Gravely M.D.   On: 12/10/2023 20:08       Assessment / Plan:    73 y/o female here with the following:   Upper GI bleed Iron  deficiency anemia Small bowel AVMs Gastric AVMs Antiplatelet use Leukocytosis   Enteroscopy done 10/7.  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  Total of 5 AVMs in the small bowel and 3 AVMs in the stomach.  One of the AVMs in the stomach was rather friable and had bleeding during endoscopic treatment, also treated with 2 hemostasis clips.  I suspect the duodenal bulb AVM was the cause of her symptoms and anemia recently.   No further bleeding since the procedure and hemoglobin is stable.  BUN normal.  Tolerating diet.  From a bleeding/anemia perspective I think she can go home today.  Resume Plavix  in about 48 hours if she continues to do well at home.  I would continue twice daily Protonix  for at least 2 weeks post procedure to prevent bleeding from APC sites.  Our office can coordinate blood work next week for her to make sure stable.  Otherwise, suspect she probably aspirated from the procedure with cough and leukocytosis and fever that night.  She has been placed on empiric antibiotics and doing better, WBC trending down, no fevers,  has some cough but improving.  Would continue empiric course of antibiotics for aspiration.   RECOMMEND: - I think okay for discharge home today - continue protonix  40mg  BID for 2 weeks and then once daily - resume Plavix  in 48 hours - continue course of empiric antibiotics for suspected aspiration - our office can coordinate follow up labs and office visit upon discharge. Will plan for a CBC next week.    Call with questions, we will sign off for now.   Marcey Naval, MD [facility]

## 2023-12-12 NOTE — Progress Notes (Signed)
 Discharge Nurse Summary: DC order noted per MD. DC RN at bedside with patient. Patient agreeable with discharge plan, states family will arrive soon for pickup. AVS printed/reviewed. DNR placed in discharge packet. PIV removed, skin intact. No DME needs. No home/TOC meds. CP/Edu resolved. Telemonitor returned to charging station. All belongings accounted for. Patient wheeled downstairs for discharge by private auto.   Rosario EMERSON Lund, RN

## 2023-12-12 NOTE — Plan of Care (Signed)

## 2023-12-12 NOTE — TOC Progression Note (Signed)
 Transition of Care [facility]) - Progression Note    Patient Details  Name: RHYLEN PULIDO MRN: [id number] Date of Birth: 1950/07/04  Transition of Care [facility]) CM/SW Contact  Rosaline JONELLE Joe, RN Phone Number: 12/12/2023, 10:44 AM  Clinical Narrative:    CM spoke with PT this morning and patient was recommended for [facility] Pt - the patient did not have  a preference for home health services.  HH order for [facility] PT placed to be co-signed by MD.  I called and spoke with Amy, RNCM with [facility] and am waiting for acceptance from the agency.                     Expected Discharge Plan and Services         Expected Discharge Date: 12/12/23                                     Social Drivers of Health (SDOH) Interventions SDOH Screenings   Food Insecurity: No Food Insecurity (12/09/2023)  Housing: Low Risk  (12/09/2023)  Transportation Needs: No Transportation Needs (12/09/2023)  Utilities: At Risk (12/09/2023)  Alcohol Screen: Low Risk  (11/28/2023)  Depression (PHQ2-9): Medium Risk (10/21/2023)  Financial Resource Strain: Low Risk  (11/28/2023)  Physical Activity: Insufficiently Active (11/28/2023)  Social Connections: Socially Isolated (12/09/2023)  Stress: Stress Concern Present (11/28/2023)  Tobacco Use: Medium Risk (12/09/2023)  Health Literacy: Adequate Health Literacy (11/28/2023)    Readmission Risk Interventions    12/11/2023    2:58 PM  Readmission Risk Prevention Plan  Transportation Screening Complete  HRI or Home Care Consult Complete  Social Work Consult for Recovery Care Planning/Counseling Complete  Palliative Care Screening Complete  Medication Review Oceanographer) Referral to Pharmacy

## 2023-12-12 NOTE — Progress Notes (Signed)
 Physical Therapy Treatment Patient Details Name: Judy Carlson MRN: [id number] DOB: 10/29/1950 Today's Date: 12/12/2023   History of Present Illness 73 yo female present with N/V and syncope. Pt found to have upper GIB. PMh HTN COPD GIB HLD CAD GERD 07/25/23 R transcarotid revascularization repair R CEA 2020 Raynaurds R CVA CKDIII PVD    PT Comments  Patient seen just prior to d/c today.  Discussion regarding recent events at home that have kept her from her baseline for mobility.  She states started in May after a procedure she had and has had several other hits since then.  Feel she is appropriate for HHPT.  Denies true vertigo symptoms though seems most are due to her anemia from GI source hopefully now addressed.  RNCM aware.     If plan is discharge home, recommend the following: Help with stairs or ramp for entrance   Can travel by private vehicle        Equipment Recommendations  None recommended by PT    Recommendations for Other Services       Precautions / Restrictions Precautions Precautions: Fall Recall of Precautions/Restrictions: Intact     Mobility  Bed Mobility Overal bed mobility: Independent             General bed mobility comments: on EOB prepping for d/c with RN delivering meds    Transfers                        Ambulation/Gait                   Stairs Stairs:  (discussed steps for home entry, pt without concern and declined to practice)           Wheelchair Mobility     Tilt Bed    Modified Rankin (Stroke Patients Only)       Balance Overall balance assessment: Modified Independent                                          Communication Communication Communication: No apparent difficulties  Cognition Arousal: Alert Behavior During Therapy: WFL for tasks assessed/performed                             Following commands: Intact      Cueing    Exercises      General  Comments General comments (skin integrity, edema, etc.): Discussion regarding dizziness symptoms.  Denies spinning or balance though does state has had progressive decline since May this year when she had a procedure then she pulled her back using riding mower, then she broke rib then now dealing for some time with light headedness/pre-syncope symptoms needing to sit or even lay on the floor to prevent passing out.  States her Neice did not feel safe with her doing this though pt feels she is managing it the best she can.  Now had GI procedure and hopeful for improvement.  Discussed progress at home and pt not sure she will be able to get back to her baseline.  Did state she goes out once a week to grief support group on Thursdays and has to start the night before to get herself able to go out for this one outing a week.  Felt best to start with HHPT and pt  in agreement.  RNCM aware.      Pertinent Vitals/Pain Pain Assessment Pain Assessment: No/denies pain    Home Living                          Prior Function            PT Goals (current goals can now be found in the care plan section) Progress towards PT goals: Progressing toward goals    Frequency    Min 2X/week      PT Plan      Co-evaluation              AM-PAC PT 6 Clicks Mobility   Outcome Measure  Help needed turning from your back to your side while in a flat bed without using bedrails?: None Help needed moving from lying on your back to sitting on the side of a flat bed without using bedrails?: None Help needed moving to and from a bed to a chair (including a wheelchair)?: None Help needed standing up from a chair using your arms (e.g., wheelchair or bedside chair)?: None Help needed to walk in hospital room?: A Little Help needed climbing 3-5 steps with a railing? : A Little 6 Click Score: 22    End of Session   Activity Tolerance: Patient tolerated treatment well Patient left: in bed   PT  Visit Diagnosis: Other abnormalities of gait and mobility (R26.89)     Time: 0950-1005 PT Time Calculation (min) (ACUTE ONLY): 15 min  Charges:    $Self Care/Home Management: 8-22 PT General Charges $$ ACUTE PT VISIT: 1 Visit                     [name], PT Acute Rehabilitation Services Office:[phone] 12/12/2023    [name] 12/12/2023, 10:54 AM

## 2023-12-12 NOTE — Telephone Encounter (Signed)
 Left message about the PREP program and asked her to return my call.

## 2023-12-13 ENCOUNTER — Telehealth: Payer: Self-pay | Admitting: *Deleted

## 2023-12-13 LAB — BPAM RBC
Blood Product Expiration Date: 202511012359
Blood Product Expiration Date: 202511012359
Blood Product Expiration Date: 202511012359
Blood Product Expiration Date: 202511022359
ISSUE DATE / TIME: 202510062126
ISSUE DATE / TIME: 202510070023
Unit Type and Rh: 202511012359
Unit Type and Rh: 6200
Unit Type and Rh: 6200
Unit Type and Rh: 6200
Unit Type and Rh: 6200

## 2023-12-13 LAB — TYPE AND SCREEN
ABO/RH(D): A POS
Antibody Screen: NEGATIVE
Unit division: 0
Unit division: 0
Unit division: 0
Unit division: 0

## 2023-12-13 NOTE — Transitions of Care (Post Inpatient/ED Visit) (Signed)
 12/13/2023  Name: Judy Carlson MRN: [id number] DOB: 1950/08/04  Today's TOC FU Call Status: Today's TOC FU Call Status:: Successful TOC FU Call Completed TOC FU Call Complete Date: 12/13/23 Patient's Name and Date of Birth confirmed.  Transition Care Management Follow-up Telephone Call Date of Discharge: 12/12/23 Discharge Facility: [facility]) Type of Discharge: Inpatient Admission Primary Inpatient Discharge Diagnosis:: symptomatic anemia secondary to GI bleeding: pre-syncope How have you been since you were released from the hospital?: Better (I am feeling much better and back to my normal self now.  I understand to look for fresh blood in my poop- they told me some old dark blood may still come out but should eventually go away.  Doing well, do not want to re-start weekly phone calls.) Any questions or concerns?: No  Items Reviewed: Did you receive and understand the discharge instructions provided?: Yes (thoroughly reviewed with patient who verbalizes good understanding of same) Medications obtained,verified, and reconciled?: Yes (Medications Reviewed) (Full medication reconciliation/ review completed; no concerns or discrepancies identified; confirmed patient obtained/ is taking all newly Rx'd medications as instructed; self-manages medications and denies questions/ concerns around medications today) Any new allergies since your discharge?: No Dietary orders reviewed?: Yes Type of Diet Ordered:: Conservative Do you have support at home?: Yes People in Home [RPT]: other relative(s) Name of Support/Comfort Primary Source: Reports independent in self-care activities; resides with supportive niece- assists as/ if needed/ indicated  Medications Reviewed Today: Medications Reviewed Today     Reviewed by Ed Rayson M, RN (Registered Nurse) on 12/13/23 at 1116  Med List Status: <None>   Medication Order Taking? Sig Documenting Provider Last Dose Status Informant   acetaminophen  (TYLENOL ) 500 MG tablet [id number] Yes Take 1,000 mg by mouth as needed for mild pain (pain score 1-3), headache or fever. [provider]  Active Self, Pharmacy Records  amLODipine -valsartan  (EXFORGE ) 5-160 MG tablet [id number] Yes Take 1 tablet by mouth 2 (two) times daily. Mona Vinie BROCKS, MD  Active Self, Pharmacy Records  aspirin  EC 81 MG tablet [id number] Yes Take 1 tablet (81 mg total) by mouth daily at 6 (six) AM. Please resume on 12/16/2023  Patient taking differently: Take 81 mg by mouth daily at 6 (six) AM. Please resume on 12/16/2023:  12/13/23: Reports during TOC call very good understanding to resume taking on 12/16/23   Verdene Purchase, MD  Active   cefdinir (OMNICEF) 300 MG capsule [id number] Yes Take 1 capsule (300 mg total) by mouth 2 (two) times daily for 5 days. Krishnan, Gokul, MD  Active   Cholecalciferol  (VITAMIN D3) 50 MCG (2000 UT) capsule [id number] Yes TAKE 1 CAPSULE (2,000 UNITS TOTAL) BY MOUTH DAILY. Plotnikov, Aleksei V, MD  Active Self, Pharmacy Records  clopidogrel  (PLAVIX ) 75 MG tablet [id number] Yes Take 1 tablet (75 mg total) by mouth daily. Please resume on 12/14/2023  Patient taking differently: Take 75 mg by mouth daily. Please resume on 12/14/2023 12/13/23: Reports during TOC call very good understanding to reume taking on December 14, 2023   Verdene Purchase, MD  Active   escitalopram  (LEXAPRO ) 5 MG tablet [id number] Yes Take 1 tablet (5 mg total) by mouth daily. Plotnikov, Aleksei V, MD  Active Self, Pharmacy Records  Evolocumab  (REPATHA  SURECLICK) 140 MG/ML EMMANUEL [id number] Yes Inject 140 mg into the skin every 14 (fourteen) days. Mona Vinie BROCKS, MD  Active Self, Pharmacy Records           Med Note [place], ALFREIDA CROME   Tue Dec 10, 2023  5:16 PM) Next dose 12/15/23  ezetimibe  (ZETIA ) 10 MG tablet [id number] Yes Take 1 tablet (10 mg total) by mouth daily. Plotnikov, Aleksei V, MD  Active Self, Pharmacy Records  ferrous sulfate  325 [phone] FE) MG  tablet [id number] Yes Take 1 tablet (325 mg total) by mouth daily with breakfast. Verdene Purchase, MD  Active   furosemide  (LASIX ) 20 MG tablet [id number] Yes TAKE 1 TABLET BY MOUTH EVERY DAY Plotnikov, Aleksei V, MD  Active Self, Pharmacy Records  metoprolol  succinate (TOPROL -XL) 100 MG 24 hr tablet [id number] Yes TAKE 1 TABLET DAILY WITH OR IMMEDIATELY FOLLOWING A MEAL. Plotnikov, Aleksei V, MD  Active Self, Pharmacy Records  Multiple Vitamin (MULTIVITAMIN) tablet [id number] Yes Take 1 tablet by mouth daily. [provider]  Active Self, Pharmacy Records  Olopatadine  HCl (PATADAY  OP) [id number] Yes Place 1 drop into both eyes daily as needed (For dry eyes). [provider]  Active Self, Pharmacy Records           Med Note EFRAIM, ALFREIDA CROME   Thu Jul 25, 2023  8:23 PM)    pantoprazole  (PROTONIX ) 40 MG tablet [id number] Yes Take 40 mg twice a day for 2 weeks and then once a day Krishnan, Gokul, MD  Active   potassium chloride  (KLOR-CON  M10) 10 MEQ tablet [id number] Yes Take 1 mEq by mouth daily. [provider]  Active Self, Pharmacy Records           Home Care and Equipment/Supplies: Were Home Health Services Ordered?: Yes (They told me they were going to order it, but I never heard back as to whether they did or not; IP TOC notes appear that referral was placed: unable to determine if referral was accepted by agency) Name of Home Health Agency:: [facility]: PT [phone] Has Agency set up a time to come to your home?: No (Provided education that home health agency has up to 48 hours to contact/ initiate services: provided contact number to patient and advised her to call agency if she has not heard from agency by Monday 12/16/23) EMR reviewed for Home Health Orders: Orders present/patient has not received call (refer to CM for follow-up) (confirmed established with [facility] RN CCM: provided education to discuss need for home health with PCP at time of hospital follow up  visit 12/19/23--- IF there is a problem with hospital referral) Any new equipment or medical supplies ordered?: No  Functional Questionnaire: Do you need assistance with bathing/showering or dressing?: No Do you need assistance with meal preparation?: No Do you need assistance with eating?: No Do you have difficulty maintaining continence: No Do you need assistance with getting out of bed/getting out of a chair/moving?: No Do you have difficulty managing or taking your medications?: No  Follow up appointments reviewed: PCP Follow-up appointment confirmed?: Yes Date of PCP follow-up appointment?: 12/19/23 Follow-up Provider: [facility]CP- [facility] Follow-up appointment confirmed?: No (noted referral to iron  infusion clinic; patient confirms has contact information for GI provider) Reason Specialist Follow-Up Not Confirmed: Patient has Specialist Provider Number and will Call for Appointment Do you need transportation to your follow-up appointment?: No Do you understand care options if your condition(s) worsen?: Yes-patient verbalized understanding  SDOH Interventions Today    Flowsheet Row Most Recent Value  SDOH Interventions   Food Insecurity Interventions Intervention Not Indicated  Housing Interventions Intervention Not Indicated  Transportation Interventions Intervention Not Indicated  [normally drives self,  niece assists as/ if indicated]  Utilities Interventions Intervention Not Indicated  [  During TOC call 12/13/23: patient denies issues aorund having utilities]   See TOC assessment tabs for additional assessment/ TOC intervention information  Patient declines need for ongoing weekly TOC outreach: confirmed patient well-established with [facility] CCM team- she reports that she prefers to maintain contact with them- as scheduled next week; declines enrollment in 30-day TOC program- declines taking my direct phone number should needs/ concerns arise post-TOC call: made  established [facility] team members aware of successful TOC call today  Pls call/ message for questions,  Jacqulene Huntley Mckinney Macey Wurtz, RN, BSN, CCRN Alumnus RN Care Manager  Transitions of Care  [facility] - [facility] [phone]: direct office

## 2023-12-15 LAB — CULTURE, BLOOD (ROUTINE X 2)
Culture: NO GROWTH
Culture: NO GROWTH

## 2023-12-16 ENCOUNTER — Other Ambulatory Visit: Payer: Self-pay | Admitting: *Deleted

## 2023-12-16 NOTE — Patient Instructions (Signed)
 Visit Information  Thank you for taking time to visit with me today. Please don't hesitate to contact me if I can be of assistance to you before our next scheduled appointment.  Your next care management appointment is by telephone on 01/03/24 at 1pm   Please call the care guide team at [phone] if you need to cancel, schedule, or reschedule an appointment.   Please call the Suicide and Crisis Lifeline: 988 call the [place]  National Suicide Prevention Lifeline: 434-466-2009 or TTY: 9163514674 TTY 725-152-4119) to talk to a trained counselor call 1-800-273-TALK (toll free, 24 hour hotline) call 911 if you are experiencing a Mental Health or Behavioral Health Crisis or need someone to talk to.  Olof Marcil, LCSW [place]  [facility], [facility] Licensed Clinical Social Worker  Direct Dial: [phone]
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
## 2023-12-17 DIAGNOSIS — Z008 Encounter for other general examination: Secondary | ICD-10-CM | POA: Diagnosis not present

## 2023-12-17 DIAGNOSIS — D509 Iron deficiency anemia, unspecified: Secondary | ICD-10-CM | POA: Diagnosis not present

## 2023-12-17 DIAGNOSIS — K250 Acute gastric ulcer with hemorrhage: Secondary | ICD-10-CM | POA: Diagnosis not present

## 2023-12-18 ENCOUNTER — Telehealth: Payer: Self-pay | Admitting: Family

## 2023-12-18 ENCOUNTER — Encounter: Payer: Self-pay | Admitting: Acute Care

## 2023-12-18 ENCOUNTER — Telehealth: Payer: Self-pay

## 2023-12-18 NOTE — Telephone Encounter (Signed)
 Spoke with patient regarding recent hospitalization, wanted Reche to know what was going on  Was admitted for GI bleed and anemia Blood pressure today 116/79 Takes Exforge  5-160 mg twice a day. She does hold afternoon dose if SBP less than 110  Has follow up with PCP tomorrow  Advised to continue as doing and keep appointment as scheduled   Will forward to Caitlin W NP for review

## 2023-12-18 NOTE — Telephone Encounter (Signed)
 Copied from CRM #[id number]. Topic: Clinical - Home Health Verbal Orders >> Dec 18, 2023  1:06 PM Alfonso ORN wrote: Caller/Agency: [facility] Number: [phone] Service Requested: to delay admission until tomorrow Frequency: n/a Any new concerns about the patient? No

## 2023-12-18 NOTE — Telephone Encounter (Signed)
 Hope she is feeling better post hospitalization. Agree to follow up wih PCP as scheduled. Okay to skip afternoon dose of antihypertensive if SBP <110. If she will just keep a log of how often she is having to skip dose, if frequently skipping may need  to reduce antihypertensive regimen. Suspect previous significant hypotension was due to her anemia.   Mandy Peeks S Anuar Walgren, NP

## 2023-12-18 NOTE — Telephone Encounter (Signed)
 Pt would like a c/b to discuss her hospital visit.

## 2023-12-18 NOTE — Telephone Encounter (Signed)
 Left message to call back.

## 2023-12-19 ENCOUNTER — Encounter: Payer: Self-pay | Admitting: Internal Medicine

## 2023-12-19 ENCOUNTER — Other Ambulatory Visit: Payer: Self-pay

## 2023-12-19 ENCOUNTER — Ambulatory Visit (INDEPENDENT_AMBULATORY_CARE_PROVIDER_SITE_OTHER): Admitting: Internal Medicine

## 2023-12-19 VITALS — BP 104/64 | HR 87 | Ht 65.0 in | Wt 177.0 lb

## 2023-12-19 DIAGNOSIS — I6521 Occlusion and stenosis of right carotid artery: Secondary | ICD-10-CM

## 2023-12-19 DIAGNOSIS — M10071 Idiopathic gout, right ankle and foot: Secondary | ICD-10-CM

## 2023-12-19 DIAGNOSIS — K922 Gastrointestinal hemorrhage, unspecified: Secondary | ICD-10-CM

## 2023-12-19 DIAGNOSIS — F32A Depression, unspecified: Secondary | ICD-10-CM

## 2023-12-19 DIAGNOSIS — N1831 Chronic kidney disease, stage 3a: Secondary | ICD-10-CM

## 2023-12-19 DIAGNOSIS — K5521 Angiodysplasia of colon with hemorrhage: Secondary | ICD-10-CM

## 2023-12-19 LAB — CBC WITH DIFFERENTIAL/PLATELET
Basophils Absolute: 0.1 K/uL (ref 0.0–0.1)
Basophils Relative: 0.6 % (ref 0.0–3.0)
Eosinophils Absolute: 0.1 K/uL (ref 0.0–0.7)
Eosinophils Relative: 1.3 % (ref 0.0–5.0)
HCT: 33.5 % — ABNORMAL LOW (ref 36.0–46.0)
Hemoglobin: 10.6 g/dL — ABNORMAL LOW (ref 12.0–15.0)
Lymphocytes Relative: 11.8 % — ABNORMAL LOW (ref 12.0–46.0)
Lymphs Abs: 1 K/uL (ref 0.7–4.0)
MCHC: 31.7 g/dL (ref 30.0–36.0)
MCV: 73.4 fl — ABNORMAL LOW (ref 78.0–100.0)
Monocytes Absolute: 0.6 K/uL (ref 0.1–1.0)
Monocytes Relative: 6.5 % (ref 3.0–12.0)
Neutro Abs: 7 K/uL (ref 1.4–7.7)
Neutrophils Relative %: 79.8 % — ABNORMAL HIGH (ref 43.0–77.0)
Platelets: 546 K/uL — ABNORMAL HIGH (ref 150.0–400.0)
RBC: 4.56 Mil/uL (ref 3.87–5.11)
RDW: 24.5 % — ABNORMAL HIGH (ref 11.5–15.5)
WBC: 8.7 K/uL (ref 4.0–10.5)

## 2023-12-19 LAB — COMPREHENSIVE METABOLIC PANEL WITH GFR
ALT: 20 U/L (ref 0–35)
AST: 18 U/L (ref 0–37)
Albumin: 4 g/dL (ref 3.5–5.2)
Alkaline Phosphatase: 77 U/L (ref 39–117)
BUN: 15 mg/dL (ref 6–23)
CO2: 24 meq/L (ref 19–32)
Calcium: 9.8 mg/dL (ref 8.4–10.5)
Chloride: 106 meq/L (ref 96–112)
Creatinine, Ser: 1.27 mg/dL — ABNORMAL HIGH (ref 0.40–1.20)
GFR: 41.97 mL/min — ABNORMAL LOW (ref >60.00)
Glucose, Bld: 124 mg/dL — ABNORMAL HIGH (ref 70–99)
Potassium: 4.1 meq/L (ref 3.5–5.1)
Sodium: 141 meq/L (ref 135–145)
Total Bilirubin: 0.4 mg/dL (ref 0.2–1.2)
Total Protein: 7.5 g/dL (ref 6.0–8.3)

## 2023-12-19 LAB — URIC ACID: Uric Acid, Serum: 9.8 mg/dL — ABNORMAL HIGH (ref 2.4–7.0)

## 2023-12-19 MED ORDER — POLYSACCHARIDE IRON COMPLEX 150 MG PO CAPS: 150.0000 mg | ORAL_CAPSULE | Freq: Every day | ORAL | 5 refills | Status: DC

## 2023-12-19 MED ORDER — COLCHICINE 0.6 MG PO TABS: ORAL_TABLET | ORAL | 1 refills | Status: AC

## 2023-12-19 MED ORDER — HYDROCODONE-ACETAMINOPHEN 5-325 MG PO TABS: 1.0000 | ORAL_TABLET | Freq: Four times a day (QID) | ORAL | 0 refills | Status: DC | PRN

## 2023-12-19 NOTE — Telephone Encounter (Signed)
 Pt returning nurses call fro yesterday. Please advise

## 2023-12-19 NOTE — Assessment & Plan Note (Signed)
 On Lexapro

## 2023-12-19 NOTE — Assessment & Plan Note (Signed)
 New upper GI bleed due to   AVM (arteriovenous malformation) of small bowel, and gastric AVM Last Hgb 9.8
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
## 2023-12-19 NOTE — Telephone Encounter (Signed)
 Spoke with the patient and gave Judy Carlson's recommendations.  She is going to keep track of how often she is holding the pm dose and let us  know.  Depending on how much we may reduce her medication.

## 2023-12-19 NOTE — Patient Instructions (Signed)
 Visit Information  Thank you for taking time to visit with me today. Please don't hesitate to contact me if I can be of assistance to you before our next scheduled appointment.  Your next care management appointment is by telephone on 01/01/24 at 11:30 am   Please call the care guide team at [phone] if you need to cancel, schedule, or reschedule an appointment.   Please call the Suicide and Crisis Lifeline: 988 call the [place]  National Suicide Prevention Lifeline: 479-078-5360 or TTY: 6298353892 TTY 351-580-2994) to talk to a trained counselor if you are experiencing a Mental Health or Behavioral Health Crisis or need someone to talk to.  Heddy Shutter, RN, MSN, BSN, CCM [facility]  [facility], Population Health Case Manager Phone: [phone]

## 2023-12-19 NOTE — Assessment & Plan Note (Signed)
 Monitor GFR Hydrate well

## 2023-12-19 NOTE — Progress Notes (Signed)
 Subjective:  Patient ID: Judy Carlson, female    DOB: April 16, 1950  Age: 73 y.o. MRN: [id number]  CC: Hospitalization Follow-up [facility] follow up 10/06-10/09 for symptomatic anemia/GI bleed)   HPI Judy Carlson presents for anemia, upper GI bleed due to   AVM (arteriovenous malformation) of small bowel, and gastric AVM Last Hgb 9.8   Per hx:  Admit date: 12/09/2023 Discharge date: 12/12/2023     PCP: Garald Karlynn GAILS, MD   DISCHARGE DIAGNOSES:  Upper GI bleed   Essential hypertension, benign   Anxiety and depression   COPD (chronic obstructive pulmonary disease) (HCC)   Polyarthralgia   Carotid artery thrombosis, right   Syncopal episodes   AVM (arteriovenous malformation) of small bowel, acquired with hemorrhage   Gastric AVM   Antiplatelet or antithrombotic long-term use     RECOMMENDATIONS FOR OUTPATIENT FOLLOW UP: To resume Plavix  in 48 hours.  Aspirin  can be resumed early next week Gastroenterology to arrange outpatient follow-up     Home Health: PT Equipment/Devices: None   CODE STATUS: Full code   DISCHARGE CONDITION: fair   Diet recommendation: As before   INITIAL HISTORY: Patient with PMH of HTN, COPD, recurrent GI bleed, HLD on Repatha , CAD medically managed, GERD presented to the hospital with complaints of nausea vomiting and syncope.  Mentioned dark-colored stool.  Was found to have severe anemia.  Patient was hospitalized for further management.     Consultants: Gastroenterology   Procedures:  Small bowel enteroscopy Impression:               - Esophagogastric landmarks identified.                           - 2 cm hiatal hernia.                           - Three non-bleeding angiodysplastic lesions in the                            stomach. Treated with argon plasma coagulation                            (APC). 2 Clips were placed.                           - Three bleeding angiodysplastic lesions in the                             duodenum. Treated with argon plasma coagulation                            (APC). 2 Clips were placed.                           - Two non-bleeding angiodysplastic lesions in the                            duodenum. Treated with argon plasma coagulation                            (APC).                           -  The examined portion of the jejunum was normal.                           Overall, 8 AVMs treated. Actively bleeding lesion                            in the duodenal bulb was the culprit for her                            symptoms, treated with APC and clips. One quite                            friable AVM in the stomach treated, clips also                            applied given bleeding during endoscopic therapy. Recommendation:           - Return patient to hospital ward for ongoing care.                           - Clear liquid diet today.                           - Continue protonix  40mg  IV BID                           - Continue present medications.                           - Continue to hold Plavix                            - Trend Hgb, we will reassess the patient tomorrow.   HOSPITAL COURSE:    Recurrent syncope. Syncope most likely due to anemia.  No further episodes after blood transfusion.  Seen by physical therapy.  Home health has been ordered.   Iron  deficiency anemia/symptomatic anemia Transfused PRBC.  Was given iron  supplements.  Improvement in hemoglobin noted.   Upper GI bleed/AVM Has prior history of recurrent upper GI bleed with iron  deficiency and folate deficiency with EGD showing gastric ulceration and gastritis. Patient was seen by gastroenterology.  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  These were treated. They be discharged on PPI.  Plavix  can be resumed in 48 hours.   Aspiration pneumonia Noted to have a fever on 10/7.  Abdominal x-ray did not show any acute findings.  Patient with cough.  Likely  aspirated.  Treated with antibiotics.  Did not have any oxygen requirements.  No shortness of breath.  No indication for chest x-ray.  Diagnosis based on clinical examination.   Acute kidney injury on chronic kidney disease stage IIIa Mild increase in creatinine noted.  Given IV fluids with improvement.   CAD. PAD. Right carotid artery endarterectomy. CVA. Chronic right-sided vision loss. Resume antiplatelets as discussed above.   Intractable nausea and vomiting. Probably as a result of GI bleed.  Improved.  Tolerating diet.   Concern for BPPV. Patient reports that she has chronic dizziness and vertigo especially with  movement of her head.  No neurological deficits noted.  Seen by physical therapy.   Thrombocytosis. Likely stress reaction.   Patient is stable.  Okay for discharge home today.   PERTINENT LABS:   The results of significant diagnostics from this hospitalization (including imaging, microbiology, ancillary and laboratory) are listed below for reference.     Microbiology:        Recent Results (from the past 240 hours)  Culture, blood (Routine X 2) w Reflex to ID Panel     Status: None (Preliminary result)    Collection Time: 12/10/23  6:39 PM    Specimen: BLOOD  Result Value Ref Range Status    Specimen Description BLOOD SITE NOT SPECIFIED   Final    Special Requests     Final      BOTTLES DRAWN AEROBIC AND ANAEROBIC Blood Culture results may not be optimal due to an inadequate volume of blood received in culture bottles    Culture     Final      NO GROWTH 3 DAYS Performed at [facility], [address], [place], [place] [address]      Report Status PENDING   Incomplete  Culture, blood (Routine X 2) w Reflex to ID Panel     Status: None (Preliminary result)    Collection Time: 12/10/23  6:42 PM    Specimen: BLOOD  Result Value Ref Range Status    Specimen Description BLOOD SITE NOT SPECIFIED   Final    Special Requests     Final      BOTTLES DRAWN  AEROBIC AND ANAEROBIC Blood Culture results may not be optimal due to an inadequate volume of blood received in culture bottles    Culture     Final      NO GROWTH 3 DAYS Performed at [facility], [address], [place], [place] [address]      Report Status PENDING   Incomplete      Labs:     Basic Metabolic Panel: Last Labs        Recent Labs  Lab 12/09/23 1338 12/10/23 0420 12/11/23 0325 12/12/23 0314  NA 140 140 139 141  K 4.0 3.8 3.9 3.3*  CL 111 109 110 109  CO2 19* 22 21* 20*  GLUCOSE 157* 107* 102* 95  BUN 19 14 12 12   CREATININE 1.35* 1.15* 1.50* 1.51*  CALCIUM  9.5 9.1 8.6* 8.9  MG  --   --  1.7  --       Outpatient Medications Prior to Visit  Medication Sig Dispense Refill   acetaminophen  (TYLENOL ) 500 MG tablet Take 1,000 mg by mouth as needed for mild pain (pain score 1-3), headache or fever.     amLODipine -valsartan  (EXFORGE ) 5-160 MG tablet Take 1 tablet by mouth 2 (two) times daily. 180 tablet 1   aspirin  EC 81 MG tablet Take 1 tablet (81 mg total) by mouth daily at 6 (six) AM. Please resume on 12/16/2023     Cholecalciferol  (VITAMIN D3) 50 MCG (2000 UT) capsule TAKE 1 CAPSULE (2,000 UNITS TOTAL) BY MOUTH DAILY. 90 capsule 3   clopidogrel  (PLAVIX ) 75 MG tablet Take 1 tablet (75 mg total) by mouth daily. Please resume on 12/14/2023 90 tablet 3   escitalopram  (LEXAPRO ) 5 MG tablet Take 1 tablet (5 mg total) by mouth daily. 90 tablet 1   Evolocumab  (REPATHA  SURECLICK) 140 MG/ML SOAJ Inject 140 mg into the skin every 14 (fourteen) days. 2  mL 6   ezetimibe  (ZETIA ) 10 MG tablet Take 1 tablet (10 mg total) by mouth daily. 90 tablet 3   furosemide  (LASIX ) 20 MG tablet TAKE 1 TABLET BY MOUTH EVERY DAY 90 tablet 2   metoprolol  succinate (TOPROL -XL) 100 MG 24 hr tablet TAKE 1 TABLET DAILY WITH OR IMMEDIATELY FOLLOWING A MEAL. 90 tablet 3   Multiple Vitamin (MULTIVITAMIN) tablet Take 1 tablet by mouth daily.     Olopatadine  HCl (PATADAY  OP) Place 1 drop into  both eyes daily as needed (For dry eyes).     pantoprazole  (PROTONIX ) 40 MG tablet Take 40 mg twice a day for 2 weeks and then once a day 60 tablet 1   potassium chloride  (KLOR-CON  M10) 10 MEQ tablet Take 1 mEq by mouth daily.     ferrous sulfate  325 (65 FE) MG tablet Take 1 tablet (325 mg total) by mouth daily with breakfast. 30 tablet 3   No facility-administered medications prior to visit.    ROS: Review of Systems  Constitutional:  Positive for fatigue. Negative for activity change, appetite change, chills and unexpected weight change.  HENT:  Negative for congestion, mouth sores and sinus pressure.   Eyes:  Negative for visual disturbance.  Respiratory:  Negative for cough and chest tightness.   Gastrointestinal:  Negative for abdominal pain and nausea.  Genitourinary:  Negative for difficulty urinating, frequency and vaginal pain.  Musculoskeletal:  Negative for back pain and gait problem.  Skin:  Negative for pallor and rash.  Neurological:  Negative for dizziness, tremors, weakness, numbness and headaches.  Psychiatric/Behavioral:  Negative for confusion and sleep disturbance.     Objective:  BP 104/64   Pulse 87   Ht 5' 5 (1.651 m)   Wt 177 lb (80.3 kg)   SpO2 99%   BMI 29.45 kg/m   BP Readings from Last 3 Encounters:  12/19/23 104/64  12/19/23 (!) 143/94  12/12/23 (!) 151/71    Wt Readings from Last 3 Encounters:  12/19/23 177 lb (80.3 kg)  12/09/23 177 lb (80.3 kg)  11/28/23 178 lb 9.6 oz (81 kg)    Physical Exam Constitutional:      General: She is not in acute distress.    Appearance: She is well-developed.  HENT:     Head: Normocephalic.     Right Ear: External ear normal.     Left Ear: External ear normal.     Nose: Nose normal.  Eyes:     General:        Right eye: No discharge.        Left eye: No discharge.     Conjunctiva/sclera: Conjunctivae normal.     Pupils: Pupils are equal, round, and reactive to light.  Neck:     Thyroid : No  thyromegaly.     Vascular: No JVD.     Trachea: No tracheal deviation.  Cardiovascular:     Rate and Rhythm: Normal rate and regular rhythm.     Heart sounds: Normal heart sounds.  Pulmonary:     Effort: No respiratory distress.     Breath sounds: No stridor. No wheezing.  Abdominal:     General: Bowel sounds are normal. There is no distension.     Palpations: Abdomen is soft. There is no mass.     Tenderness: There is no abdominal tenderness. There is no guarding or rebound.  Musculoskeletal:        General: No tenderness.     Cervical back: Normal range  of motion and neck supple. No rigidity.  Lymphadenopathy:     Cervical: No cervical adenopathy.  Skin:    Findings: No erythema or rash.  Neurological:     Cranial Nerves: No cranial nerve deficit.     Motor: No abnormal muscle tone.     Coordination: Coordination normal.     Deep Tendon Reflexes: Reflexes normal.  Psychiatric:        Behavior: Behavior normal.        Thought Content: Thought content normal.        Judgment: Judgment normal.     Lab Results  Component Value Date   WBC 8.7 12/19/2023   HGB 10.6 (L) 12/19/2023   HCT 33.5 (L) 12/19/2023   PLT 546.0 (H) 12/19/2023   GLUCOSE 124 (H) 12/19/2023   CHOL 178 09/12/2023   TRIG 111.0 09/12/2023   HDL 67.30 09/12/2023   LDLDIRECT 132.9 04/30/2007   LDLCALC 89 09/12/2023   ALT 20 12/19/2023   AST 18 12/19/2023   NA 141 12/19/2023   K 4.1 12/19/2023   CL 106 12/19/2023   CREATININE 1.27 (H) 12/19/2023   BUN 15 12/19/2023   CO2 24 12/19/2023   TSH 1.627 08/12/2023   INR 1.0 12/09/2023   HGBA1C 5.6 12/14/2022    DG Abd Portable 1V Result Date: 12/10/2023 CLINICAL DATA:  Intractable nausea and vomiting. EXAM: PORTABLE ABDOMEN - 1 VIEW COMPARISON:  CT abdomen and pelvis 04/05/2023 FINDINGS: Scattered stool throughout the colon. No small or large bowel distention. Linear foreign bodies projecting over the upper abdomen likely representing postoperative change  or surface structures. No radiopaque stones. Degenerative changes in the spine and hips. Soft tissue contours appear intact. Visualized lung bases are clear. IMPRESSION: Normal nonobstructive bowel gas pattern. Electronically Signed   By: Elsie Gravely M.D.   On: 12/10/2023 20:08   CT HEAD WO CONTRAST ( ) Result Date: 12/09/2023 EXAM: CT HEAD WITHOUT CONTRAST 12/09/2023 09:14:04 PM TECHNIQUE: CT of the head was performed without the administration of intravenous contrast. Automated exposure control, iterative reconstruction, and/or weight based adjustment of the mA/kV was utilized to reduce the radiation dose to as low as reasonably achievable. COMPARISON: CT Head dated 08/11/2023. CLINICAL HISTORY: Syncope/presyncope, suspected cerebrovascular cause. FINDINGS: BRAIN AND VENTRICLES: No acute hemorrhage, no evidence of acute infarct, no hydrocephalus, and no extra-axial collection or mass effect. ORBITS: No acute abnormality. SINUSES: No acute abnormality. SOFT TISSUES AND SKULL: No acute soft tissue abnormality and no skull fracture. IMPRESSION: 1. No acute intracranial abnormality. Electronically signed by: Franky Stanford MD 12/09/2023 11:05 PM EDT RP Workstation: [id number]    Assessment & Plan:   Problem List Items Addressed This Visit     Anxiety and depression   On Lexapro       AVM (arteriovenous malformation) of small bowel, acquired with hemorrhage   New upper GI bleed due to   AVM (arteriovenous malformation) of small bowel, and gastric AVM Last Hgb 9.8      Relevant Orders   Comprehensive metabolic panel with GFR (Completed)   CBC with Differential/Platelet (Completed)   Carotid artery stenosis   On Plavix       Carotid artery thrombosis, right   On Plavix  Off Xarelto  due to AVM related GI bleeding      Chronic upper GI bleeding - Primary   New upper GI bleed due to   AVM (arteriovenous malformation) of small bowel, and gastric AVM Last Hgb 9.8      CKD (chronic kidney  disease) stage  3, GFR 30-59 ml/min (HCC)   Monitor GFR Hydrate well      Other Visit Diagnoses       Acute idiopathic gout of right foot       Relevant Medications   colchicine 0.6 MG tablet   HYDROcodone -acetaminophen  (NORCO/VICODIN) 5-325 MG tablet   Other Relevant Orders   Uric acid (Completed)         Meds ordered this encounter  Medications   colchicine 0.6 MG tablet    Sig: Take two tablets prn gout attack.Then take another one in 1-2 hrs. Do not repeat for 3 days.    Dispense:  18 tablet    Refill:  1   HYDROcodone -acetaminophen  (NORCO/VICODIN) 5-325 MG tablet    Sig: Take 1 tablet by mouth every 6 (six) hours as needed for severe pain (pain score 7-10).    Dispense:  20 tablet    Refill:  0   iron  polysaccharides (NIFEREX) 150 MG capsule    Sig: Take 1 capsule (150 mg total) by mouth daily.    Dispense:  30 capsule    Refill:  5      Follow-up: Return in about 6 weeks (around 01/30/2024) for a follow-up visit.  Marolyn Noel, MD

## 2023-12-20 ENCOUNTER — Telehealth: Payer: Self-pay

## 2023-12-20 ENCOUNTER — Ambulatory Visit: Payer: Self-pay | Admitting: Internal Medicine

## 2023-12-20 NOTE — Telephone Encounter (Signed)
 Okay.  Thanks.

## 2023-12-20 NOTE — Telephone Encounter (Signed)
 Copied from CRM #[id number]. Topic: Clinical - Home Health Verbal Orders >> Dec 20, 2023 10:45 AM Laymon HERO wrote: Caller/Agency: [facility] Callback Number: [phone] Service Requested: Physical Therapy Frequency: 2x2- 1x6  Any new concerns about the patient? No

## 2023-12-20 NOTE — Telephone Encounter (Signed)
 LDVM for [facility] Requested: Physical Therapy Frequency: 2x2- 1x6

## 2023-12-23 ENCOUNTER — Ambulatory Visit: Admitting: Gastroenterology

## 2023-12-23 ENCOUNTER — Encounter: Payer: Self-pay | Admitting: Gastroenterology

## 2023-12-23 ENCOUNTER — Other Ambulatory Visit (INDEPENDENT_AMBULATORY_CARE_PROVIDER_SITE_OTHER)

## 2023-12-23 VITALS — BP 126/68 | HR 86 | Ht 65.0 in | Wt 176.0 lb

## 2023-12-23 DIAGNOSIS — R195 Other fecal abnormalities: Secondary | ICD-10-CM | POA: Diagnosis not present

## 2023-12-23 DIAGNOSIS — D509 Iron deficiency anemia, unspecified: Secondary | ICD-10-CM

## 2023-12-23 DIAGNOSIS — K922 Gastrointestinal hemorrhage, unspecified: Secondary | ICD-10-CM | POA: Diagnosis not present

## 2023-12-23 DIAGNOSIS — K31819 Angiodysplasia of stomach and duodenum without bleeding: Secondary | ICD-10-CM

## 2023-12-23 DIAGNOSIS — K5521 Angiodysplasia of colon with hemorrhage: Secondary | ICD-10-CM | POA: Diagnosis not present

## 2023-12-23 DIAGNOSIS — R42 Dizziness and giddiness: Secondary | ICD-10-CM

## 2023-12-23 DIAGNOSIS — R1010 Upper abdominal pain, unspecified: Secondary | ICD-10-CM | POA: Diagnosis not present

## 2023-12-23 LAB — COMPREHENSIVE METABOLIC PANEL WITH GFR
ALT: 18 U/L (ref 0–35)
AST: 23 U/L (ref 0–37)
Albumin: 4.3 g/dL (ref 3.5–5.2)
Alkaline Phosphatase: 79 U/L (ref 39–117)
BUN: 19 mg/dL (ref 6–23)
CO2: 23 meq/L (ref 19–32)
Calcium: 10.1 mg/dL (ref 8.4–10.5)
Chloride: 106 meq/L (ref 96–112)
Creatinine, Ser: 1.43 mg/dL — ABNORMAL HIGH (ref 0.40–1.20)
GFR: 36.39 mL/min — ABNORMAL LOW (ref >60.00)
Glucose, Bld: 117 mg/dL — ABNORMAL HIGH (ref 70–99)
Potassium: 4.7 meq/L (ref 3.5–5.1)
Sodium: 142 meq/L (ref 135–145)
Total Bilirubin: 0.2 mg/dL (ref 0.2–1.2)
Total Protein: 8 g/dL (ref 6.0–8.3)

## 2023-12-23 LAB — CBC WITH DIFFERENTIAL/PLATELET
Basophils Absolute: 0.1 K/uL (ref 0.0–0.1)
Basophils Relative: 1.1 % (ref 0.0–3.0)
Eosinophils Absolute: 0.1 K/uL (ref 0.0–0.7)
Eosinophils Relative: 1.5 % (ref 0.0–5.0)
HCT: 35.9 % — ABNORMAL LOW (ref 36.0–46.0)
Hemoglobin: 11.3 g/dL — ABNORMAL LOW (ref 12.0–15.0)
Lymphocytes Relative: 14 % (ref 12.0–46.0)
Lymphs Abs: 1 K/uL (ref 0.7–4.0)
MCHC: 31.6 g/dL (ref 30.0–36.0)
MCV: 73 fl — ABNORMAL LOW (ref 78.0–100.0)
Monocytes Absolute: 0.7 K/uL (ref 0.1–1.0)
Monocytes Relative: 9.8 % (ref 3.0–12.0)
Neutro Abs: 5.3 K/uL (ref 1.4–7.7)
Neutrophils Relative %: 73.6 % (ref 43.0–77.0)
Platelets: 754 K/uL — ABNORMAL HIGH (ref 150.0–400.0)
RBC: 4.91 Mil/uL (ref 3.87–5.11)
RDW: 24.1 % — ABNORMAL HIGH (ref 11.5–15.5)
WBC: 7.2 K/uL (ref 4.0–10.5)
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
## 2023-12-23 NOTE — Assessment & Plan Note (Signed)
 On Plavix

## 2023-12-23 NOTE — Patient Instructions (Addendum)
 Let us  know if your symptoms worsen.   Continue protonix  40 mg twice daily.   Miralax 1 capful daily for constipation.   Your provider has requested that you go to the basement level for lab work before leaving today. Press B on the elevator. The lab is located at the first door on the left as you exit the elevator.   _______________________________________________________  If your blood pressure at your visit was 140/90 or greater, please contact your primary care physician to follow up on this.  _______________________________________________________  If you are age 51 or older, your body mass index should be between 23-30. Your Body mass index is 29.29 kg/m. If this is out of the aforementioned range listed, please consider follow up with your Primary Care Provider.  If you are age 11 or younger, your body mass index should be between 19-25. Your Body mass index is 29.29 kg/m. If this is out of the aformentioned range listed, please consider follow up with your Primary Care Provider.   ________________________________________________________  The [facility] providers would like to encourage you to use MYCHART to communicate with providers for non-urgent requests or questions.  Due to long hold times on the telephone, sending your provider a message by [facility] may be a faster and more efficient way to get a response.  Please allow 48 business hours for a response.  Please remember that this is for non-urgent requests.  _______________________________________________________  [facility] is using a team-based approach to care.  Your team is made up of your doctor and two to three APPS. Our APPS (Nurse Practitioners and Physician Assistants) work with your physician to ensure care continuity for you. They are fully qualified to address your health concerns and develop a treatment plan. They communicate directly with your gastroenterologist to care for you. Seeing the Advanced  Practice Practitioners on your physician's team can help you by facilitating care more promptly, often allowing for earlier appointments, access to diagnostic testing, procedures, and other specialty referrals.

## 2023-12-23 NOTE — Assessment & Plan Note (Signed)
 On Plavix  Off Xarelto  due to AVM related GI bleeding

## 2023-12-24 ENCOUNTER — Encounter: Payer: Self-pay | Admitting: Gastroenterology

## 2023-12-24 ENCOUNTER — Ambulatory Visit

## 2023-12-24 VITALS — BP 126/80 | HR 80 | Temp 98.1°F | Resp 20 | Ht 65.0 in | Wt 175.8 lb

## 2023-12-24 DIAGNOSIS — D509 Iron deficiency anemia, unspecified: Secondary | ICD-10-CM

## 2023-12-24 DIAGNOSIS — D508 Other iron deficiency anemias: Secondary | ICD-10-CM

## 2023-12-24 MED ORDER — IRON SUCROSE 20 MG/ML IV SOLN
200.0000 mg | Freq: Once | INTRAVENOUS | Status: AC
  Administered 2023-12-24: 200 mg via INTRAVENOUS
  Filled 2023-12-24: qty 10

## 2023-12-24 NOTE — Progress Notes (Signed)
 ____________________________________________________________  Attending physician addendum:  Thank you for sending this case to me. I have reviewed the entire note and agree with the plan.  IV iron  would be preferable long-term to the oral iron  that is causing her GI upset.  Victory Brand, MD  ____________________________________________________________

## 2023-12-24 NOTE — Progress Notes (Signed)
 Diagnosis: Iron  Deficiency Anemia  Provider:  Praveen Mannam MD  Procedure: IV Push  IV Type: Peripheral, IV Location: L Antecubital  Venofer  (Iron  Sucrose), Dose: 200 mg  Post Infusion IV Care: Observation period completed and Peripheral IV Discontinued  Discharge: Condition: Good, Destination: Home . AVS Declined  Performed by:  Leita FORBES Miles, LPN

## 2023-12-26 ENCOUNTER — Ambulatory Visit (INDEPENDENT_AMBULATORY_CARE_PROVIDER_SITE_OTHER)

## 2023-12-26 ENCOUNTER — Ambulatory Visit: Payer: Self-pay | Admitting: Gastroenterology

## 2023-12-26 ENCOUNTER — Institutional Professional Consult (permissible substitution) (HOSPITAL_BASED_OUTPATIENT_CLINIC_OR_DEPARTMENT_OTHER): Admitting: Family

## 2023-12-26 VITALS — BP 118/73 | HR 75 | Temp 98.0°F | Resp 16 | Ht 65.0 in | Wt 176.6 lb

## 2023-12-26 DIAGNOSIS — R0989 Other specified symptoms and signs involving the circulatory and respiratory systems: Secondary | ICD-10-CM | POA: Diagnosis not present

## 2023-12-26 DIAGNOSIS — D509 Iron deficiency anemia, unspecified: Secondary | ICD-10-CM

## 2023-12-26 DIAGNOSIS — D508 Other iron deficiency anemias: Secondary | ICD-10-CM | POA: Diagnosis not present

## 2023-12-26 MED ORDER — IRON SUCROSE 20 MG/ML IV SOLN
200.0000 mg | Freq: Once | INTRAVENOUS | Status: AC
  Administered 2023-12-26: 200 mg via INTRAVENOUS

## 2023-12-26 NOTE — Progress Notes (Signed)
 Diagnosis: Iron Deficiency Anemia  Provider:  Chilton Greathouse MD  Procedure: IV Push  IV Type: Peripheral, IV Location: R Antecubital  Venofer (Iron Sucrose), Dose: 200 mg  Post Infusion IV Care: Observation period completed and Peripheral IV Discontinued  Discharge: Condition: Good, Destination: Home . AVS Declined  Performed by:  Loney Hering, LPN

## 2023-12-27 ENCOUNTER — Ambulatory Visit (HOSPITAL_BASED_OUTPATIENT_CLINIC_OR_DEPARTMENT_OTHER): Payer: Self-pay | Admitting: Family

## 2023-12-27 MED ORDER — AMLODIPINE BESYLATE-VALSARTAN 5-160 MG PO TABS: 1.0000 | ORAL_TABLET | Freq: Every day | ORAL | 1 refills | Status: AC

## 2023-12-27 NOTE — Addendum Note (Signed)
 Addended by: GLADIS PORTER HERO on: 12/27/2023 12:35 PM   Modules accepted: Orders

## 2023-12-27 NOTE — Telephone Encounter (Signed)
Pt returned nurse call.

## 2023-12-27 NOTE — Telephone Encounter (Signed)
 Pt returned a call back to the office to get her renal US  results and further discuss return of symptoms.  Provided the pt with her renal US  results per Reche Finder, NP, and she verbalized understanding.   Pt states she did want to let Reche Finder, NP know that her symptoms of dizziness and pre-syncope have returned.  She voiced no other cardiac complaints.  She states her BP on 10/22 in the AM was 107/74 then rechecked again in 30 mins and was 97/72.   She states she then HELD her Exforge  that morning and rechecked her BP later that afternoon and BP was 148/82.  She then took her Exforge  at that time, and was fine the remainder of the night.   She did take her Toprol  XL as scheduled that day.  Pt states her BP yesterday at 7 am was 127/87 and she then went in for her scheduled iron  infusion and rechecked her BP at 3:52 pm and was 134/87.   She took her regular cardiac meds as scheduled that day.  Pt has not checked her BP's today, and not around her cuff to recheck.  Pt  was recently admitted to the hospital in early Oct for symptomatic anemia with reoccurring syncope and ended up having upper GI bleed/AVM.  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  She did see GI for post-hospital follow-up on 10/20, but had no mention of reoccurring symptoms to voice at that visit, for she was feeling fine at that time.   She states her return of dizziness and pre-syncope has just occurred over the last 2 days.  Pt is wanting to run symptoms by Reche Finder, NP for further recommendation.   Asked the pt if she has called GI about her symptoms she has experienced over the last 2 days, and she confirmed she had not.   Advised her to call them today to report symptoms, for complaints could likely be related to her GI issues and recent hospitalization. She did have a CBC 4 days ago, and numbers have improved, Hbg 10/16-10.6 and 10/20 -11.3.    Pt aware I will  forward this call to Reche Finder, NP to further review and advise on, and I will follow-up with her accordingly thereafter with any new recommendations.  Pt verbalized understanding and and agrees with this plan.

## 2023-12-27 NOTE — Telephone Encounter (Signed)
-----   Message from Hamilton Bergeron sent at 12/27/2023 10:14 AM EDT -----

## 2023-12-27 NOTE — Telephone Encounter (Signed)
 Returned a call back to the pt and endorsed recommendations per Reche Finder, NP.  Pt aware to decrease her Exforge  to taking it once daily vs bid.   She is aware if BP less than 110/60 prior to medications, she may HOLD lasix .    Advised the pt to proceed with calling GI MD.   Scheduled the pt to come into the office to see Reche Finder, NP in 2 weeks on Tuesday 11/4 at 1055.  Pt aware to arrive 15 mins prior to this visit.   Confirmed the pharmacy of choice with the pt.   Pt verbalized understanding and agrees with this plan.

## 2023-12-27 NOTE — Telephone Encounter (Signed)
 Camie see update from pt last office visit in 10/20

## 2023-12-27 NOTE — Telephone Encounter (Signed)
Addressed via separate phone encounter.   Alver Sorrow, NP

## 2023-12-27 NOTE — Telephone Encounter (Signed)
 Reduce Amlodipine -Valsartan  to one tablet once per day. If BP less than 110/60 prior to medications, hold FUrosemide  (lasix ). Agree with plan to follow up with GI as well. Let's get her an OV within the next 1-2 weeks for BP follow up with me. (Okay to use a non-HTN day)  Vitali Seibert S Everley Evora, NP

## 2023-12-30 ENCOUNTER — Ambulatory Visit (INDEPENDENT_AMBULATORY_CARE_PROVIDER_SITE_OTHER)

## 2023-12-30 VITALS — BP 136/82 | HR 69 | Temp 97.4°F | Resp 18 | Ht 65.0 in | Wt 176.4 lb

## 2023-12-30 DIAGNOSIS — D508 Other iron deficiency anemias: Secondary | ICD-10-CM

## 2023-12-30 DIAGNOSIS — D509 Iron deficiency anemia, unspecified: Secondary | ICD-10-CM | POA: Diagnosis not present

## 2023-12-30 MED ORDER — IRON SUCROSE 20 MG/ML IV SOLN
200.0000 mg | Freq: Once | INTRAVENOUS | Status: AC
  Administered 2023-12-30: 200 mg via INTRAVENOUS
  Filled 2023-12-30: qty 10

## 2023-12-30 NOTE — Progress Notes (Signed)
 Diagnosis: Iron  Deficiency Anemia  Provider:  Praveen Mannam MD  Procedure: IV Push  IV Type: Peripheral, IV Location: L Antecubital  Venofer  (Iron  Sucrose), Dose: 200 mg  Post Infusion IV Care: Observation period completed and Peripheral IV Discontinued  Discharge: Condition: Good, Destination: Home . AVS Declined  Performed by:  Maximiano JONELLE Pouch, LPN

## 2023-12-31 ENCOUNTER — Ambulatory Visit: Admitting: Internal Medicine

## 2023-12-31 ENCOUNTER — Telehealth: Payer: Self-pay

## 2023-12-31 NOTE — Telephone Encounter (Unsigned)
 Copied from CRM [id number]. Topic: Clinical - Home Health Verbal Orders >> Dec 31, 2023 11:47 AM Rea BROCKS wrote: Caller/Agency: [facility]  Callback Number: [phone] Service Requested:  Frequency:  Any new concerns about the patient? Patient rolled out of bed this morning. No injuries.

## 2024-01-01 ENCOUNTER — Encounter (HOSPITAL_BASED_OUTPATIENT_CLINIC_OR_DEPARTMENT_OTHER): Payer: Self-pay

## 2024-01-01 ENCOUNTER — Other Ambulatory Visit: Payer: Self-pay
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
## 2024-01-01 NOTE — Telephone Encounter (Signed)
 Not clear how or if all the symptoms are related to each other.  Agree that we can no longer implicate the anemia for lightheadedness with hemoglobin 11.3 nine days ago (and another IV iron  treatment afterward).  If she is still on oral iron , please have her stop that because it could be causing digestive upset.  My advice is that she contact cardiology for further investigation of her lightheadedness to see if she could be having any issues with her blood pressure or heart rate/rhythm.  VEAR Brand MD

## 2024-01-01 NOTE — Patient Instructions (Signed)
 Visit Information  Thank you for taking time to visit with me today. Please don't hesitate to contact me if I can be of assistance to you before our next scheduled appointment.  Your next care management appointment is by telephone on 01/22/24 at 10:30 am  Please call the care guide team at [phone] if you need to cancel, schedule, or reschedule an appointment.   Please call the Suicide and Crisis Lifeline: 988 call the [place]  National Suicide Prevention Lifeline: 305 098 1960 or TTY: 773-689-3017 TTY 330 624 4781) to talk to a trained counselor if you are experiencing a Mental Health or Behavioral Health Crisis or need someone to talk to.  Heddy Shutter, RN, MSN, BSN, CCM [place]  [facility], Population Health Case Manager Phone: [phone]

## 2024-01-02 ENCOUNTER — Ambulatory Visit (INDEPENDENT_AMBULATORY_CARE_PROVIDER_SITE_OTHER)

## 2024-01-02 VITALS — BP 138/81 | HR 69 | Temp 98.4°F | Resp 16 | Ht 65.0 in | Wt 179.4 lb

## 2024-01-02 DIAGNOSIS — D508 Other iron deficiency anemias: Secondary | ICD-10-CM | POA: Diagnosis not present

## 2024-01-02 DIAGNOSIS — D509 Iron deficiency anemia, unspecified: Secondary | ICD-10-CM | POA: Diagnosis not present

## 2024-01-02 MED ORDER — IRON SUCROSE 20 MG/ML IV SOLN
200.0000 mg | Freq: Once | INTRAVENOUS | Status: AC
  Administered 2024-01-02: 200 mg via INTRAVENOUS
  Filled 2024-01-02: qty 10

## 2024-01-02 NOTE — Progress Notes (Signed)
 Diagnosis: Iron Deficiency Anemia  Provider:  Chilton Greathouse MD  Procedure: IV Push  IV Type: Peripheral, IV Location: L Antecubital  Venofer (Iron Sucrose), Dose: 200 mg  Post Infusion IV Care: Observation period completed and Peripheral IV Discontinued  Discharge: Condition: Stable, Destination: Home . AVS Provided  Performed by:  Wyvonne Lenz, RN

## 2024-01-03 ENCOUNTER — Other Ambulatory Visit: Payer: Self-pay | Admitting: *Deleted

## 2024-01-03 NOTE — Patient Instructions (Signed)
 Visit Information  Thank you for taking time to visit with me today. Please don't hesitate to contact me if I can be of assistance to you before our next scheduled appointment.  Your next care management appointment is no further scheduled appointments.   Closing From: VBCI clinical social work  Please call the care guide team at [phone] if you need to cancel, schedule, or reschedule an appointment.   Please call the Suicide and Crisis Lifeline: 988 call the [place]  National Suicide Prevention Lifeline: 623-242-5942 or TTY: 5312210907 TTY (431)002-7948) to talk to a trained counselor call 1-800-273-TALK (toll free, 24 hour hotline) call 911 if you are experiencing a Mental Health or Behavioral Health Crisis or need someone to talk to.  Muhammad Vacca, LCSW [place]  [facility], [facility] Licensed Clinical Social Worker  Direct Dial: [phone]
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
## 2024-01-07 ENCOUNTER — Ambulatory Visit (HOSPITAL_BASED_OUTPATIENT_CLINIC_OR_DEPARTMENT_OTHER): Admitting: Family

## 2024-01-07 ENCOUNTER — Encounter (HOSPITAL_BASED_OUTPATIENT_CLINIC_OR_DEPARTMENT_OTHER): Payer: Self-pay | Admitting: Family

## 2024-01-07 ENCOUNTER — Ambulatory Visit: Admitting: Internal Medicine

## 2024-01-07 VITALS — BP 112/60 | HR 86 | Ht 65.0 in | Wt 180.0 lb

## 2024-01-07 DIAGNOSIS — E876 Hypokalemia: Secondary | ICD-10-CM | POA: Diagnosis not present

## 2024-01-07 DIAGNOSIS — Z79899 Other long term (current) drug therapy: Secondary | ICD-10-CM

## 2024-01-07 DIAGNOSIS — I10 Essential (primary) hypertension: Secondary | ICD-10-CM

## 2024-01-07 MED ORDER — FUROSEMIDE 20 MG PO TABS: 20.0000 mg | ORAL_TABLET | ORAL | 0 refills | Status: AC | PRN

## 2024-01-07 NOTE — Patient Instructions (Signed)
 Medication Instructions:    TAKE Furosemide  (lasix ) 20 mg by mouth daily as needed for lower extremity swelling  STOP taking Potassium chloride  now    *If you need a refill on your cardiac medications before your next appointment, please call your pharmacy*  Lab Work:  RETURN FOR LAB WORK IN ONE WEEK--BMET  If you have labs (blood work) drawn today and your tests are completely normal, you will receive your results only by: MyChart Message (if you have MyChart) OR A paper copy in the mail If you have any lab test that is abnormal or we need to change your treatment, we will call you to review the results.   Follow-Up:  AS SCHEDULED
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
## 2024-01-14 ENCOUNTER — Ambulatory Visit (HOSPITAL_BASED_OUTPATIENT_CLINIC_OR_DEPARTMENT_OTHER): Payer: Self-pay | Admitting: Family

## 2024-01-14 LAB — BASIC METABOLIC PANEL WITH GFR
BUN/Creatinine Ratio: 9 — ABNORMAL LOW (ref 12–28)
BUN: 10 mg/dL (ref 8–27)
CO2: 21 mmol/L (ref 20–29)
Calcium: 9.9 mg/dL (ref 8.7–10.3)
Chloride: 106 mmol/L (ref 96–106)
Creatinine, Ser: 1.08 mg/dL — ABNORMAL HIGH (ref 0.57–1.00)
Glucose: 98 mg/dL (ref 70–99)
Potassium: 4.5 mmol/L (ref 3.5–5.2)
Sodium: 143 mmol/L (ref 134–144)
eGFR: 54 mL/min/1.73 — ABNORMAL LOW (ref >59)

## 2024-01-16 ENCOUNTER — Telehealth: Payer: Self-pay

## 2024-01-16 ENCOUNTER — Encounter: Payer: Self-pay | Admitting: Internal Medicine

## 2024-01-16 ENCOUNTER — Other Ambulatory Visit: Payer: Self-pay | Admitting: Internal Medicine

## 2024-01-16 DIAGNOSIS — Z1231 Encounter for screening mammogram for malignant neoplasm of breast: Secondary | ICD-10-CM

## 2024-01-16 NOTE — Telephone Encounter (Signed)
 Is interested in the PREP Program and taking the class at [facility] starting in the new year. We will call her back when dates are set.

## 2024-01-20 ENCOUNTER — Ambulatory Visit
Admission: RE | Admit: 2024-01-20 | Discharge: 2024-01-20 | Disposition: A | Discharge status: Home / Self Care | Source: Ambulatory Visit | Attending: Acute Care | Admitting: Acute Care

## 2024-01-20 DIAGNOSIS — R911 Solitary pulmonary nodule: Secondary | ICD-10-CM

## 2024-01-22 ENCOUNTER — Other Ambulatory Visit: Payer: Self-pay

## 2024-01-22 ENCOUNTER — Encounter (HOSPITAL_BASED_OUTPATIENT_CLINIC_OR_DEPARTMENT_OTHER): Payer: Self-pay

## 2024-01-22 NOTE — Patient Instructions (Signed)
 Visit Information  Thank you for taking time to visit with me today. Please don't hesitate to contact me if I can be of assistance to you before our next scheduled appointment.  Your next care management appointment is by telephone on 02/21/24 at 1:30 pm  Please call the care guide team at [phone] if you need to cancel, schedule, or reschedule an appointment.   Please call the Suicide and Crisis Lifeline: 988 call the [place]  National Suicide Prevention Lifeline: 220-863-2496 or TTY: 647-811-4352 TTY 940-086-8244) to talk to a trained counselor if you are experiencing a Mental Health or Behavioral Health Crisis or need someone to talk to.  Heddy Shutter, RN, MSN, BSN, CCM [place]  [facility], Population Health Case Manager Phone: [phone]
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
## 2024-01-27 ENCOUNTER — Encounter (HOSPITAL_BASED_OUTPATIENT_CLINIC_OR_DEPARTMENT_OTHER): Payer: Self-pay

## 2024-01-28 ENCOUNTER — Telehealth: Payer: Self-pay | Admitting: *Deleted

## 2024-01-28 NOTE — Telephone Encounter (Signed)
 Call report from [facility]:  IMPRESSION: 1. Lung-RADS 0, incomplete. New left basilar scattered nodularity and irregular consolidations measuring up to 7.6 mm, likely infectious/inflammatory. Recommend follow-up low-dose chest CT in 1-3 months. 2. Aortic Atherosclerosis (ICD10-I70.0) and Emphysema (ICD10-J43.9). Coronary artery calcifications. Assessment for potential risk factor modification, dietary therapy or pharmacologic therapy may be warranted, if clinically indicated.

## 2024-01-29 ENCOUNTER — Emergency Department (HOSPITAL_COMMUNITY)
Admission: EM | Admit: 2024-01-29 | Discharge: 2024-01-29 | Disposition: A | Discharge status: Home / Self Care | Attending: Emergency Medicine | Admitting: Emergency Medicine

## 2024-01-29 ENCOUNTER — Encounter (HOSPITAL_COMMUNITY): Payer: Self-pay | Admitting: Emergency Medicine

## 2024-01-29 DIAGNOSIS — N3090 Cystitis, unspecified without hematuria: Secondary | ICD-10-CM | POA: Diagnosis not present

## 2024-01-29 DIAGNOSIS — R300 Dysuria: Secondary | ICD-10-CM | POA: Diagnosis present

## 2024-01-29 LAB — URINALYSIS, ROUTINE W REFLEX MICROSCOPIC
Bilirubin Urine: NEGATIVE
Glucose, UA: NEGATIVE mg/dL
Ketones, ur: NEGATIVE mg/dL
Nitrite: NEGATIVE
Protein, ur: 100 mg/dL — AB
RBC / HPF: >50 RBC/hpf (ref 0–5)
Specific Gravity, Urine: 1.011 (ref 1.005–1.030)
WBC, UA: >50 WBC/hpf (ref 0–5)
pH: 7 (ref 5.0–8.0)

## 2024-01-29 LAB — CBC WITH DIFFERENTIAL/PLATELET
Abs Immature Granulocytes: 0.03 K/uL (ref 0.00–0.07)
Basophils Absolute: 0.1 K/uL (ref 0.0–0.1)
Basophils Relative: 1 %
Eosinophils Absolute: 0.2 K/uL (ref 0.0–0.5)
Eosinophils Relative: 3 %
HCT: 42.2 % (ref 36.0–46.0)
Hemoglobin: 13.3 g/dL (ref 12.0–15.0)
Immature Granulocytes: 0 %
Lymphocytes Relative: 21 %
Lymphs Abs: 1.7 K/uL (ref 0.7–4.0)
MCH: 25.1 pg — ABNORMAL LOW (ref 26.0–34.0)
MCHC: 31.5 g/dL (ref 30.0–36.0)
MCV: 79.6 fL — ABNORMAL LOW (ref 80.0–100.0)
Monocytes Absolute: 0.8 K/uL (ref 0.1–1.0)
Monocytes Relative: 10 %
Neutro Abs: 5.3 K/uL (ref 1.7–7.7)
Neutrophils Relative %: 65 %
Platelets: 381 K/uL (ref 150–400)
RBC: 5.3 MIL/uL — ABNORMAL HIGH (ref 3.87–5.11)
RDW: 25.4 % — ABNORMAL HIGH (ref 11.5–15.5)
Smear Review: NORMAL
WBC: 8.1 K/uL (ref 4.0–10.5)
nRBC: 0 % (ref 0.0–0.2)

## 2024-01-29 LAB — BASIC METABOLIC PANEL WITH GFR
Anion gap: 11 (ref 5–15)
BUN: 12 mg/dL (ref 8–23)
CO2: 22 mmol/L (ref 22–32)
Calcium: 9.4 mg/dL (ref 8.9–10.3)
Chloride: 104 mmol/L (ref 98–111)
Creatinine, Ser: 1.03 mg/dL — ABNORMAL HIGH (ref 0.44–1.00)
GFR, Estimated: 57 mL/min — ABNORMAL LOW (ref >60)
Glucose, Bld: 117 mg/dL — ABNORMAL HIGH (ref 70–99)
Potassium: 3.6 mmol/L (ref 3.5–5.1)
Sodium: 137 mmol/L (ref 135–145)

## 2024-01-29 MED ORDER — CEPHALEXIN 500 MG PO CAPS: 500.0000 mg | ORAL_CAPSULE | Freq: Two times a day (BID) | ORAL | 0 refills | Status: DC

## 2024-01-29 MED ORDER — CEPHALEXIN 250 MG PO CAPS
500.0000 mg | ORAL_CAPSULE | Freq: Once | ORAL | Status: AC
  Administered 2024-01-29: 500 mg via ORAL
  Filled 2024-01-29: qty 2

## 2024-01-29 NOTE — Telephone Encounter (Signed)
 Confirmed receipt of call report LDCT [id number]

## 2024-01-29 NOTE — ED Triage Notes (Addendum)
 Pain on urination starting tonight with bladder pressure associated. No fever. Also endorses hematuria.

## 2024-01-29 NOTE — Telephone Encounter (Signed)
 Copied from CRM #[id number]. Topic: Clinical - Lab/Test Results >> Jan 28, 2024  1:17 PM Whitney O wrote: Reason for CRM: ms tiffany from [facility] wanting to make sure those results were received in epic . Just wanted to make sure the team got those results  If any questions please call ms tiffany  [phone]  These were received.seen by [facility] nurses.NFN

## 2024-01-29 NOTE — Telephone Encounter (Signed)
 Called and spoke to pt. We discussed her results of the LDCT she had on 01/20/2024. She states she had very slight chest congestion a couple mornings around the time of the scan. Patient states the congestions did not bother her enough to make her seek treatment. Patient states she does not have any acute s/s at this time.   Will send to [facility] to advise when her next follow up scan should be. Radiology has recommended 1-3 month follow up.

## 2024-01-29 NOTE — ED Provider Notes (Signed)
 [facility] Emergency Department Provider Note MRN:  [id number]  Arrival date & time: 01/29/24     Chief Complaint   Hematuria   History of Present Illness   Judy Carlson is a 73 y.o. year-old female presents to the ED with chief complaint of dysuria.  Symptom onset was tonight after midnight.  She reports having to urinate about 6 times throughout the night.  Noticed some hematuria this morning.  She denies any fever, chills, nausea, or vomiting.  States that she does not frequently get UTIs.  Denies any flank pain or back pain.  Denies any treatment prior to arrival.  Denies any other associated symptoms..  History provided by patient.   Review of Systems  Pertinent positive and negative review of systems noted in HPI.    Physical Exam   Vitals:   01/29/24 0451  BP: (!) 151/91  Pulse: 98  Resp: 18  Temp: 97.6 F (36.4 C)  SpO2: 97%    CONSTITUTIONAL:  non toxic-appearing, NAD NEURO:  Alert and oriented x 3, CN 3-12 grossly intact EYES:  eyes equal and reactive ENT/NECK:  Supple, no stridor  CARDIO:  normal rate, regular rhythm, appears well-perfused  PULM:  No respiratory distress, CTAB GI/GU:  non-distended, generalized suprapubic discomfort MSK/SPINE:  No gross deformities, no edema, moves all extremities  SKIN:  no rash, atraumatic   *Additional and/or pertinent findings included in MDM below  Diagnostic and Interventional Summary    EKG Interpretation Date/Time:    Ventricular Rate:    PR Interval:    QRS Duration:    QT Interval:    QTC Calculation:   R Axis:      Text Interpretation:         Labs Reviewed  URINALYSIS, ROUTINE W REFLEX MICROSCOPIC - Abnormal; Notable for the following components:      Result Value   APPearance CLOUDY (*)    Hgb urine dipstick LARGE (*)    Protein, ur 100 (*)    Leukocytes,Ua LARGE (*)    Bacteria, UA FEW (*)    All other components within normal limits  CBC WITH  DIFFERENTIAL/PLATELET - Abnormal; Notable for the following components:   RBC 5.30 (*)    MCV 79.6 (*)    MCH 25.1 (*)    RDW 25.4 (*)    All other components within normal limits  BASIC METABOLIC PANEL WITH GFR - Abnormal; Notable for the following components:   Glucose, Bld 117 (*)    Creatinine, Ser 1.03 (*)    GFR, Estimated 57 (*)    All other components within normal limits    No orders to display    Medications  cephALEXin  (KEFLEX ) capsule 500 mg (500 mg Oral Given 01/29/24 0551)     Procedures  /  Critical Care Procedures  ED Course and Medical Decision Making  I have reviewed the triage vital signs, the nursing notes, and pertinent available records from the EMR.  Social Determinants Affecting Complexity of Care: Patient has no clinically significant social determinants affecting this chief complaint..   ED Course:    Medical Decision Making Patient here with dysuria and urinary frequency and urgency that started last night around midnight.  Vitals are stable.  She is afebrile.  Urinalysis is worrisome for infection.  No marked leukocytosis.  No significant electrolyte abnormality.  She looks very well.  Will treat with oral antibiotics and outpatient treatment.  Patient understands agrees plan.  Return precautions discussed.  Amount and/or Complexity of Data Reviewed Labs: ordered.  Risk Prescription drug management.         Consultants: No consultations were needed in caring for this patient.   Treatment and Plan: I considered admission due to patient's initial presentation, but after considering the examination and diagnostic results, patient will not require admission and can be discharged with outpatient follow-up.    Final Clinical Impressions(s) / ED Diagnoses     ICD-10-CM   1. Cystitis  N30.90       ED Discharge Orders          Ordered    cephALEXin  (KEFLEX ) 500 MG capsule  2 times daily,   Status:  Discontinued        01/29/24 0549     cephALEXin  (KEFLEX ) 500 MG capsule  2 times daily        01/29/24 9373              Discharge Instructions Discussed with and Provided to Patient:     Discharge Instructions      Please return for new or worsening symptoms including fever, vomiting, and increasing pain.       Vicky Charleston, PA-C 01/29/24 9372    Emil Share, DO 01/29/24 [phone]

## 2024-01-29 NOTE — Discharge Instructions (Addendum)
 Please return for new or worsening symptoms including fever, vomiting, and increasing pain.

## 2024-02-03 ENCOUNTER — Ambulatory Visit: Admitting: Internal Medicine

## 2024-02-03 ENCOUNTER — Encounter: Payer: Self-pay | Admitting: Internal Medicine

## 2024-02-03 VITALS — BP 134/82 | HR 69 | Ht 65.0 in | Wt 177.8 lb

## 2024-02-03 DIAGNOSIS — F4321 Adjustment disorder with depressed mood: Secondary | ICD-10-CM

## 2024-02-03 DIAGNOSIS — F419 Anxiety disorder, unspecified: Secondary | ICD-10-CM | POA: Diagnosis not present

## 2024-02-03 DIAGNOSIS — L910 Hypertrophic scar: Secondary | ICD-10-CM | POA: Insufficient documentation

## 2024-02-03 DIAGNOSIS — K5521 Angiodysplasia of colon with hemorrhage: Secondary | ICD-10-CM

## 2024-02-03 DIAGNOSIS — I10 Essential (primary) hypertension: Secondary | ICD-10-CM | POA: Diagnosis not present

## 2024-02-03 DIAGNOSIS — N1831 Chronic kidney disease, stage 3a: Secondary | ICD-10-CM

## 2024-02-03 DIAGNOSIS — F32A Depression, unspecified: Secondary | ICD-10-CM

## 2024-02-03 DIAGNOSIS — N3000 Acute cystitis without hematuria: Secondary | ICD-10-CM

## 2024-02-03 MED ORDER — TRIAMCINOLONE ACETONIDE 0.5 % EX OINT: 1.0000 | TOPICAL_OINTMENT | Freq: Four times a day (QID) | CUTANEOUS | 2 refills | Status: AC

## 2024-02-03 MED ORDER — ESCITALOPRAM OXALATE 10 MG PO TABS: 10.0000 mg | ORAL_TABLET | Freq: Every day | ORAL | 1 refills | Status: AC

## 2024-02-03 MED ORDER — CEPHALEXIN 500 MG PO CAPS: 500.0000 mg | ORAL_CAPSULE | Freq: Two times a day (BID) | ORAL | 1 refills | Status: DC

## 2024-02-03 NOTE — Progress Notes (Signed)
 Subjective:  Patient ID: Judy Carlson, female    DOB: 02-24-1951  Age: 73 y.o. MRN: [id number]  CC: Medical Management of Chronic Issues (6 Week follow up, has since had iron  infusions. FYI of hospital visit 11/26, UTI and hematuria. Currently treating antibiotics//Recent episode of gout 11/29)   HPI Judy Carlson presents for a f/u  Medical Management of Chronic Issues (6 Week follow up, has since had iron  infusions. FYI of hospital visit 11/26, UTI and hematuria. Currently treating antibiotics//Recent episode of gout 11/29). F/u on AVMs/GI bleed/anemia. Not on iron  after iron  Infusions x4... C/o itchy keloid on the R neck. Just had a UTI   Outpatient Medications Prior to Visit  Medication Sig Dispense Refill   acetaminophen  (TYLENOL ) 500 MG tablet Take 1,000 mg by mouth as needed for mild pain (pain score 1-3), headache or fever.     amLODipine -valsartan  (EXFORGE ) 5-160 MG tablet Take 1 tablet by mouth daily. 90 tablet 1   aspirin  EC 81 MG tablet Take 1 tablet (81 mg total) by mouth daily at 6 (six) AM. Please resume on 12/16/2023     Cholecalciferol  (VITAMIN D3) 50 MCG (2000 UT) capsule TAKE 1 CAPSULE (2,000 UNITS TOTAL) BY MOUTH DAILY. 90 capsule 3   clopidogrel  (PLAVIX ) 75 MG tablet Take 1 tablet (75 mg total) by mouth daily. Please resume on 12/14/2023 90 tablet 3   colchicine  0.6 MG tablet Take two tablets prn gout attack.Then take another one in 1-2 hrs. Do not repeat for 3 days. 18 tablet 1   Evolocumab  (REPATHA  SURECLICK) 140 MG/ML SOAJ Inject 140 mg into the skin every 14 (fourteen) days. 2 mL 6   ezetimibe  (ZETIA ) 10 MG tablet Take 1 tablet (10 mg total) by mouth daily. 90 tablet 3   furosemide  (LASIX ) 20 MG tablet Take 1 tablet (20 mg total) by mouth as needed for edema. 30 tablet 0   metoprolol  succinate (TOPROL -XL) 100 MG 24 hr tablet TAKE 1 TABLET DAILY WITH OR IMMEDIATELY FOLLOWING A MEAL. 90 tablet 3   Multiple Vitamin (MULTIVITAMIN) tablet Take 1 tablet by mouth  daily.     Olopatadine  HCl (PATADAY  OP) Place 1 drop into both eyes daily as needed (For dry eyes).     pantoprazole  (PROTONIX ) 40 MG tablet Take 40 mg twice a day for 2 weeks and then once a day 60 tablet 1   cephALEXin  (KEFLEX ) 500 MG capsule Take 1 capsule (500 mg total) by mouth 2 (two) times daily. 14 capsule 0   escitalopram  (LEXAPRO ) 5 MG tablet Take 1 tablet (5 mg total) by mouth daily. 90 tablet 1   No facility-administered medications prior to visit.    ROS: Review of Systems  Constitutional:  Positive for fatigue. Negative for activity change, appetite change, chills and unexpected weight change.  HENT:  Negative for congestion, mouth sores and sinus pressure.   Eyes:  Negative for visual disturbance.  Respiratory:  Negative for cough and chest tightness.   Gastrointestinal:  Negative for abdominal pain and nausea.  Genitourinary:  Negative for difficulty urinating, frequency and vaginal pain.  Musculoskeletal:  Negative for back pain and gait problem.  Skin:  Negative for pallor and rash.  Neurological:  Negative for dizziness, tremors, weakness, numbness and headaches.  Psychiatric/Behavioral:  Negative for confusion, sleep disturbance and suicidal ideas. The patient is not nervous/anxious.     Objective:  BP 134/82   Pulse 69   Ht 5' 5 (1.651 m)   Wt 177 lb 12.8  oz (80.6 kg)   SpO2 97%   BMI 29.59 kg/m   BP Readings from Last 3 Encounters:  02/03/24 134/82  01/29/24 (!) 151/91  01/22/24 137/86    Wt Readings from Last 3 Encounters:  02/03/24 177 lb 12.8 oz (80.6 kg)  01/22/24 177 lb (80.3 kg)  01/07/24 180 lb (81.6 kg)    Physical Exam Constitutional:      General: She is not in acute distress.    Appearance: She is well-developed. She is obese.  HENT:     Head: Normocephalic.     Right Ear: External ear normal.     Left Ear: External ear normal.     Nose: Nose normal.  Eyes:     General:        Right eye: No discharge.        Left eye: No  discharge.     Conjunctiva/sclera: Conjunctivae normal.     Pupils: Pupils are equal, round, and reactive to light.  Neck:     Thyroid : No thyromegaly.     Vascular: No JVD.     Trachea: No tracheal deviation.  Cardiovascular:     Rate and Rhythm: Normal rate and regular rhythm.     Heart sounds: Normal heart sounds.  Pulmonary:     Effort: No respiratory distress.     Breath sounds: No stridor. No wheezing.  Abdominal:     General: Bowel sounds are normal. There is no distension.     Palpations: Abdomen is soft. There is no mass.     Tenderness: There is no abdominal tenderness. There is no guarding or rebound.  Musculoskeletal:        General: No tenderness.     Cervical back: Normal range of motion and neck supple. No rigidity.  Lymphadenopathy:     Cervical: No cervical adenopathy.  Skin:    Findings: No erythema or rash.  Neurological:     Cranial Nerves: No cranial nerve deficit.     Motor: No abnormal muscle tone.     Coordination: Coordination normal.     Deep Tendon Reflexes: Reflexes normal.  Psychiatric:        Behavior: Behavior normal.        Thought Content: Thought content normal.        Judgment: Judgment normal.    Neck w/keloid on the R   Lab Results  Component Value Date   WBC 8.1 01/29/2024   HGB 13.3 01/29/2024   HCT 42.2 01/29/2024   PLT 381 01/29/2024   GLUCOSE 117 (H) 01/29/2024   CHOL 178 09/12/2023   TRIG 111.0 09/12/2023   HDL 67.30 09/12/2023   LDLDIRECT 132.9 04/30/2007   LDLCALC 89 09/12/2023   ALT 18 12/23/2023   AST 23 12/23/2023   NA 137 01/29/2024   K 3.6 01/29/2024   CL 104 01/29/2024   CREATININE 1.03 (H) 01/29/2024   BUN 12 01/29/2024   CO2 22 01/29/2024   TSH 1.627 08/12/2023   INR 1.0 12/09/2023   HGBA1C 5.6 12/14/2022    No results found.  Assessment & Plan:   Problem List Items Addressed This Visit     Anxiety and depression   On Lexapro . Increase to 10 mg/d      Relevant Medications   escitalopram   (LEXAPRO ) 10 MG tablet   AVM (arteriovenous malformation) of small bowel, acquired with hemorrhage - Primary   New upper GI bleed due to   AVM (arteriovenous malformation) of small bowel, and gastric  AVM Last Hgb is better       CKD (chronic kidney disease) stage 3, GFR 30-59 ml/min (HCC)   Monitor GFR Hydrate well      Essential hypertension, benign   On Valsartan , Toprol       Grief   Increase Lexapro  to 10 mg/d - grieving      Keloid scar   Neck w/keloid on the R Triamc ont qid We can use cryo, steroid inj      Urinary tract infection   Relevant Medications   cephALEXin  (KEFLEX ) 500 MG capsule   Other Relevant Orders   Urinalysis      Meds ordered this encounter  Medications   escitalopram  (LEXAPRO ) 10 MG tablet    Sig: Take 1 tablet (10 mg total) by mouth daily.    Dispense:  90 tablet    Refill:  1   triamcinolone ointment (KENALOG) 0.5 %    Sig: Apply 1 Application topically 4 (four) times daily.    Dispense:  60 g    Refill:  2   cephALEXin  (KEFLEX ) 500 MG capsule    Sig: Take 1 capsule (500 mg total) by mouth 2 (two) times daily.    Dispense:  14 capsule    Refill:  1      Follow-up: Return in about 3 months (around 05/03/2024) for a follow-up visit.  Marolyn Noel, MD

## 2024-02-03 NOTE — Assessment & Plan Note (Signed)
 Monitor GFR Hydrate well

## 2024-02-03 NOTE — Assessment & Plan Note (Signed)
 On Valsartan , Toprol 

## 2024-02-03 NOTE — Assessment & Plan Note (Signed)
 New upper GI bleed due to   AVM (arteriovenous malformation) of small bowel, and gastric AVM Last Hgb is better

## 2024-02-03 NOTE — Assessment & Plan Note (Signed)
 On Lexapro . Increase to 10 mg/d

## 2024-02-03 NOTE — Assessment & Plan Note (Signed)
 Neck w/keloid on the R Triamc ont qid We can use cryo, steroid inj

## 2024-02-03 NOTE — Telephone Encounter (Signed)
 LR 0, As she has not been sick, lets do a 6-8 week follow up Ct Chest.Thanks so much.This will be due about the end of December. Please fax results to PCP so they know the plan.

## 2024-02-03 NOTE — Assessment & Plan Note (Signed)
 Increase Lexapro  to 10 mg/d - grieving

## 2024-02-04 ENCOUNTER — Other Ambulatory Visit: Payer: Self-pay

## 2024-02-04 DIAGNOSIS — Z122 Encounter for screening for malignant neoplasm of respiratory organs: Secondary | ICD-10-CM

## 2024-02-04 DIAGNOSIS — R911 Solitary pulmonary nodule: Secondary | ICD-10-CM

## 2024-02-04 DIAGNOSIS — Z87891 Personal history of nicotine dependence: Secondary | ICD-10-CM

## 2024-02-04 NOTE — Telephone Encounter (Signed)
 Attempted to call patient and review results. No answer, LVM on home phone.

## 2024-02-04 NOTE — Telephone Encounter (Signed)
 Spoke with patient and reviewed recent Lung CT results. Advises she was sick at the time of her recent LDCT. Cough has now cleared with no meds. Pt has been scheduled for a 8 week follow up CT 03/24/2024, order placed.

## 2024-02-07 ENCOUNTER — Other Ambulatory Visit

## 2024-02-07 DIAGNOSIS — N3000 Acute cystitis without hematuria: Secondary | ICD-10-CM

## 2024-02-07 LAB — URINALYSIS, ROUTINE W REFLEX MICROSCOPIC
Nitrite: NEGATIVE
Specific Gravity, Urine: 1.02 (ref 1.000–1.030)
Total Protein, Urine: 30 — AB
Urine Glucose: NEGATIVE
Urobilinogen, UA: 0.2 (ref 0.0–1.0)
pH: 6 (ref 5.0–8.0)

## 2024-02-09 ENCOUNTER — Ambulatory Visit: Payer: Self-pay | Admitting: Internal Medicine

## 2024-02-11 ENCOUNTER — Encounter: Payer: Self-pay | Admitting: Gastroenterology

## 2024-02-11 ENCOUNTER — Ambulatory Visit: Admitting: Gastroenterology

## 2024-02-11 ENCOUNTER — Other Ambulatory Visit

## 2024-02-11 VITALS — BP 130/72 | HR 78 | Ht 66.0 in | Wt 180.4 lb

## 2024-02-11 DIAGNOSIS — Z8711 Personal history of peptic ulcer disease: Secondary | ICD-10-CM

## 2024-02-11 DIAGNOSIS — K5521 Angiodysplasia of colon with hemorrhage: Secondary | ICD-10-CM

## 2024-02-11 DIAGNOSIS — K922 Gastrointestinal hemorrhage, unspecified: Secondary | ICD-10-CM

## 2024-02-11 DIAGNOSIS — K31819 Angiodysplasia of stomach and duodenum without bleeding: Secondary | ICD-10-CM

## 2024-02-11 DIAGNOSIS — D509 Iron deficiency anemia, unspecified: Secondary | ICD-10-CM

## 2024-02-11 DIAGNOSIS — R1024 Suprapubic pain: Secondary | ICD-10-CM

## 2024-02-11 LAB — CBC WITH DIFFERENTIAL/PLATELET
Basophils Absolute: 0 K/uL (ref 0.0–0.1)
Basophils Relative: 0.9 % (ref 0.0–3.0)
Eosinophils Absolute: 0.2 K/uL (ref 0.0–0.7)
Eosinophils Relative: 3.4 % (ref 0.0–5.0)
HCT: 38.3 % (ref 36.0–46.0)
Hemoglobin: 12.6 g/dL (ref 12.0–15.0)
Lymphocytes Relative: 23.2 % (ref 12.0–46.0)
Lymphs Abs: 1 K/uL (ref 0.7–4.0)
MCHC: 32.9 g/dL (ref 30.0–36.0)
MCV: 77.7 fl — ABNORMAL LOW (ref 78.0–100.0)
Monocytes Absolute: 0.6 K/uL (ref 0.1–1.0)
Monocytes Relative: 12.8 % — ABNORMAL HIGH (ref 3.0–12.0)
Neutro Abs: 2.7 K/uL (ref 1.4–7.7)
Neutrophils Relative %: 59.7 % (ref 43.0–77.0)
Platelets: 335 K/uL (ref 150.0–400.0)
RBC: 4.92 Mil/uL (ref 3.87–5.11)
RDW: 26 % — ABNORMAL HIGH (ref 11.5–15.5)
WBC: 4.5 K/uL (ref 4.0–10.5)

## 2024-02-11 LAB — IBC + FERRITIN
Ferritin: 90.3 ng/mL (ref 10.0–291.0)
Iron: 83 ug/dL (ref 42–145)
Saturation Ratios: 24.6 % (ref 20.0–50.0)
TIBC: 337.4 ug/dL (ref 250.0–450.0)
Transferrin: 241 mg/dL (ref 212.0–360.0)
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
## 2024-02-11 NOTE — Patient Instructions (Signed)
 Your provider has requested that you go to the basement level for lab work before leaving today. Press B on the elevator. The lab is located at the first door on the left as you exit the elevator.  Due to recent changes in healthcare laws, you may see the results of your imaging and laboratory studies on MyChart before your provider has had a chance to review them.  We understand that in some cases there may be results that are confusing or concerning to you. Not all laboratory results come back in the same time frame and the provider may be waiting for multiple results in order to interpret others.  Please give us  48 hours in order for your provider to thoroughly review all the results before contacting the office for clarification of your results.   Continue Pantoprazole  40 mg once daily.   _______________________________________________________  If your blood pressure at your visit was 140/90 or greater, please contact your primary care physician to follow up on this.  _______________________________________________________  If you are age 73 or older, your body mass index should be between 23-30. Your Body mass index is 29.11 kg/m. If this is out of the aforementioned range listed, please consider follow up with your Primary Care Provider.  If you are age 73 or younger, your body mass index should be between 19-25. Your Body mass index is 29.11 kg/m. If this is out of the aformentioned range listed, please consider follow up with your Primary Care Provider.   ________________________________________________________  The [facility] providers would like to encourage you to use MYCHART to communicate with providers for non-urgent requests or questions.  Due to long hold times on the telephone, sending your provider a message by [facility] may be a faster and more efficient way to get a response.  Please allow 48 business hours for a response.  Please remember that this is for non-urgent requests.   _______________________________________________________  [facility] is using a team-based approach to care.  Your team is made up of your doctor and two to three APPS. Our APPS (Nurse Practitioners and Physician Assistants) work with your physician to ensure care continuity for you. They are fully qualified to address your health concerns and develop a treatment plan. They communicate directly with your gastroenterologist to care for you. Seeing the Advanced Practice Practitioners on your physician's team can help you by facilitating care more promptly, often allowing for earlier appointments, access to diagnostic testing, procedures, and other specialty referrals.   Thank you for choosing me and [facility].  Camie Furbish, PA-C

## 2024-02-13 ENCOUNTER — Telehealth: Payer: Self-pay

## 2024-02-13 NOTE — Progress Notes (Signed)
 ____________________________________________________________  Attending physician addendum:  Thank you for sending this case to me. I have reviewed the entire note and agree with the plan.   Victory Brand, MD  ____________________________________________________________

## 2024-02-13 NOTE — Telephone Encounter (Signed)
 Left message about upcoming PREP class at [facility] starting January 6th, asked her to return my call.

## 2024-02-14 ENCOUNTER — Ambulatory Visit: Payer: Self-pay | Admitting: Gastroenterology

## 2024-02-17 NOTE — Addendum Note (Signed)
 Addended by: DAYNE SHERRY RAMAN on: 02/17/2024 02:33 PM   Modules accepted: Orders

## 2024-02-18 ENCOUNTER — Telehealth: Payer: Self-pay

## 2024-02-18 NOTE — Telephone Encounter (Signed)
 Judy Carlson returned my call, called her today to try to schedule her initial assessment for the PREP class.

## 2024-02-19 ENCOUNTER — Other Ambulatory Visit (HOSPITAL_COMMUNITY): Payer: Self-pay

## 2024-02-20 ENCOUNTER — Inpatient Hospital Stay: Admission: RE | Admit: 2024-02-20 | Discharge: 2024-02-20 | Attending: Internal Medicine | Admitting: Internal Medicine

## 2024-02-20 DIAGNOSIS — Z1231 Encounter for screening mammogram for malignant neoplasm of breast: Secondary | ICD-10-CM | POA: Insufficient documentation

## 2024-02-21 ENCOUNTER — Telehealth

## 2024-02-21 ENCOUNTER — Ambulatory Visit: Payer: Self-pay

## 2024-02-21 LAB — CBC
Hematocrit: 40.7 % (ref 34.0–46.6)
Hemoglobin: 12.5 g/dL (ref 11.1–15.9)
MCH: 25.5 pg — ABNORMAL LOW (ref 26.6–33.0)
MCHC: 30.7 g/dL — ABNORMAL LOW (ref 31.5–35.7)
MCV: 83 fL (ref 79–97)
Platelets: 318 x10E3/uL (ref 150–450)
RBC: 4.91 x10E6/uL (ref 3.77–5.28)
RDW: 21 % — ABNORMAL HIGH (ref 11.7–15.4)
WBC: 4.2 x10E3/uL (ref 3.4–10.8)
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
## 2024-02-21 NOTE — Telephone Encounter (Signed)
 FYI Only or Action Required?: FYI only for provider: appointment scheduled on 12/22.  Patient was last seen in primary care on 02/03/2024 by Plotnikov, Karlynn GAILS, MD.  Called Nurse Triage reporting Dysuria.  Symptoms began several weeks ago.  Interventions attempted: Prescription medications: Keflex  500 mg BID.  Symptoms are: gradually improving. Not completely resolved  Triage Disposition: See Within 3 Days in Office (overriding See Physician Within 24 Hours)  Patient/caregiver understands and will follow disposition?: Yes   Dx with cystitis on 11/26 in the ED, started on Keflex  and has been taking continuously since then. Receiving rx from PCP with 1 refill for persistent symptoms. Last dose is today. Pain with urination has improved from 9/10 to now 3/10 with abx but still present.  Denies urine retention or fever or blood. Mild pain in lower abdomen. Denies N/V. OK to be seen in next 3 days per RN judgement. Scheduled soonest OV Monday, advised UC over the weekend for  possible worsening symptoms in the meantime.    Message from Rea ORN sent at 02/21/2024  3:59 PM EST  Summary: UTI pain with urination   Reason for Triage: Pt would like to speak to a nurse regarding UTI sx. Pt stated she is still having pain when she urinates. The pain comes after the stream of urine stops. Pt stated she has already been treated for the UTI and was given Keflex  500 mg 7 day supply with one refill. Pt is on th last day of refilled rx and is still having pain. Pt sent a message via Mychart asking if she should have her urine re-tested. The CMA responded You do not need to re-test your urine unless you have new developed or redeveloped symptoms that return.. Pt stated there is nothing new or redeveloped, it just never went away and has been consistent.  Pt is asking if she should be prescribed new rx or if she needs to come in to be re-evaluated since the rx is completed.  Please call back, [phone]          Reason for Disposition  [1] Taking antibiotic > 72 hours (3 days) for UTI AND [2] painful urination or frequency is SAME (unchanged, not better)    Has been taking abx since 11/26, has improved but no complete resolution. Ok to be seen in next 3 days per [employer].  Additional Information  Negative: [1] Taking antibiotic > 72 hours (3 days) for UTI AND [2] flank or lower back pain is SAME (unchanged, not better)    Has been taking abx since 11/26, no complete resolution.  Answer Assessment - Initial Assessment Questions 1. MAIN SYMPTOM: What is the main symptom you are concerned about? (e.g., painful urination, urine frequency)     Pain with urination  2. BETTER-SAME-WORSE: Are you getting better, staying the same, or getting worse compared to how you felt at your last visit to the doctor (most recent medical visit)?     Better but still lingering. Started at 9/10, now 3/10 3. PAIN: How bad is the pain?  (e.g., Scale 1-10; mild, moderate, or severe)     3-10 4. FEVER: Do you have a fever? If Yes, ask: What is it, how was it measured, and when did it start?     Denies 5. OTHER SYMPTOMS: Do you have any other symptoms? (e.g., blood in the urine, flank pain, vaginal discharge)     Mild pain in lower abdomen 6. DIAGNOSIS: When was the UTI diagnosed? By  whom? Was it a kidney infection, bladder infection or both?     Cystitis 11/26 in the ED 7. ANTIBIOTIC: What antibiotic(s) are you taking? How many times per day?     Keflex  500 mg BID 8. ANTIBIOTIC - START DATE: When did you start taking the antibiotic?     11/26 in the ED, has been taking abx continuously since then. After receiving rx from PCP with 1 refill for persistent symptoms. Last dose is today.  Protocols used: Urinary Tract Infection on Antibiotic Follow-up Call - [facility]

## 2024-02-21 NOTE — Patient Instructions (Signed)
 Visit Information  Thank you for taking time to visit with me today. Please don't hesitate to contact me if I can be of assistance to you before our next scheduled appointment.  Your next care management appointment is by telephone on 03/25/24 at 1:30 pm  Please call the care guide team at [phone] if you need to cancel, schedule, or reschedule an appointment.   Please call the Suicide and Crisis Lifeline: 988 call the [place]  National Suicide Prevention Lifeline: 7574170786 or TTY: (628) 472-1980 TTY (671)001-9942) to talk to a trained counselor if you are experiencing a Mental Health or Behavioral Health Crisis or need someone to talk to.  Heddy Shutter, RN, MSN, BSN, CCM [place]  [facility], Population Health Case Manager Phone: [phone]

## 2024-02-24 ENCOUNTER — Encounter: Payer: Self-pay | Admitting: Internal Medicine

## 2024-02-24 ENCOUNTER — Ambulatory Visit (INDEPENDENT_AMBULATORY_CARE_PROVIDER_SITE_OTHER): Payer: Self-pay | Admitting: Internal Medicine

## 2024-02-24 ENCOUNTER — Ambulatory Visit: Payer: Self-pay | Admitting: Internal Medicine

## 2024-02-24 VITALS — BP 144/82 | HR 62 | Temp 98.6°F | Ht 66.0 in | Wt 182.4 lb

## 2024-02-24 DIAGNOSIS — I10 Essential (primary) hypertension: Secondary | ICD-10-CM

## 2024-02-24 DIAGNOSIS — R739 Hyperglycemia, unspecified: Secondary | ICD-10-CM | POA: Diagnosis not present

## 2024-02-24 DIAGNOSIS — N1831 Chronic kidney disease, stage 3a: Secondary | ICD-10-CM | POA: Diagnosis not present

## 2024-02-24 DIAGNOSIS — N3000 Acute cystitis without hematuria: Secondary | ICD-10-CM | POA: Diagnosis not present

## 2024-02-24 DIAGNOSIS — R10814 Left lower quadrant abdominal tenderness: Secondary | ICD-10-CM | POA: Insufficient documentation

## 2024-02-24 DIAGNOSIS — R10813 Right lower quadrant abdominal tenderness: Secondary | ICD-10-CM

## 2024-02-24 LAB — BASIC METABOLIC PANEL WITH GFR
BUN: 19 mg/dL (ref 6–23)
CO2: 25 meq/L (ref 19–32)
Calcium: 9.4 mg/dL (ref 8.4–10.5)
Chloride: 108 meq/L (ref 96–112)
Creatinine, Ser: 1.08 mg/dL (ref 0.40–1.20)
GFR: 50.91 mL/min — ABNORMAL LOW
Glucose, Bld: 135 mg/dL — ABNORMAL HIGH (ref 70–99)
Potassium: 3.7 meq/L (ref 3.5–5.1)
Sodium: 141 meq/L (ref 135–145)

## 2024-02-24 LAB — URINALYSIS, ROUTINE W REFLEX MICROSCOPIC
Hgb urine dipstick: NEGATIVE
Nitrite: NEGATIVE
RBC / HPF: NONE SEEN
Specific Gravity, Urine: 1.025 (ref 1.000–1.030)
Total Protein, Urine: NEGATIVE
Urine Glucose: NEGATIVE
Urobilinogen, UA: 0.2 (ref 0.0–1.0)
pH: 6 (ref 5.0–8.0)

## 2024-02-24 LAB — LIPASE: Lipase: 25 U/L (ref 11.0–59.0)

## 2024-02-24 LAB — HEMOGLOBIN A1C: Hgb A1c MFr Bld: 5.5 % (ref 4.6–6.5)

## 2024-02-24 LAB — CBC WITH DIFFERENTIAL/PLATELET
Basophils Absolute: 0.1 K/uL (ref 0.0–0.1)
Basophils Relative: 1.4 % (ref 0.0–3.0)
Eosinophils Absolute: 0.1 K/uL (ref 0.0–0.7)
Eosinophils Relative: 3.4 % (ref 0.0–5.0)
HCT: 37.3 % (ref 36.0–46.0)
Hemoglobin: 12.2 g/dL (ref 12.0–15.0)
Lymphocytes Relative: 29.5 % (ref 12.0–46.0)
Lymphs Abs: 1.2 K/uL (ref 0.7–4.0)
MCHC: 32.6 g/dL (ref 30.0–36.0)
MCV: 79.3 fl (ref 78.0–100.0)
Monocytes Absolute: 0.4 K/uL (ref 0.1–1.0)
Monocytes Relative: 9.4 % (ref 3.0–12.0)
Neutro Abs: 2.3 K/uL (ref 1.4–7.7)
Neutrophils Relative %: 56.3 % (ref 43.0–77.0)
Platelets: 291 K/uL (ref 150.0–400.0)
RBC: 4.71 Mil/uL (ref 3.87–5.11)
RDW: 23.9 % — ABNORMAL HIGH (ref 11.5–15.5)
WBC: 4.1 K/uL (ref 4.0–10.5)

## 2024-02-24 LAB — C-REACTIVE PROTEIN: CRP: <0.5 mg/dL — ABNORMAL LOW (ref 1.0–20.0)

## 2024-02-24 MED ORDER — SULFAMETHOXAZOLE-TRIMETHOPRIM 800-160 MG PO TABS: 1.0000 | ORAL_TABLET | Freq: Two times a day (BID) | ORAL | 0 refills | Status: AC

## 2024-02-24 NOTE — Progress Notes (Signed)
 "  Subjective:  Patient ID: Judy Carlson, female    DOB: January 24, 1951  Age: 73 y.o. MRN: [id number]  CC: Dysuria (Chronic dysuria post diagnosis of cystitis) and Hypertension   HPI Judy Carlson presents for f/up ---  Discussed the use of AI scribe software for clinical note transcription with the patient, who gave verbal consent to proceed.  History of Present Illness Judy Carlson is a 73 year old female who presents with persistent painful urination after treatment for a UTI.  She experiences ongoing dysuria despite completing a course of Keflex  for a urinary tract infection approximately two to three weeks ago, initially before Thanksgiving. The pain occurs at the end of the urinary stream and is described as less severe than before, but still present. No fever, chills, nausea, or vomiting are reported.  She mentions some back pain but notes more significant discomfort over the bladder area. Additionally, she has experienced right lower quadrant abdominal pain for the past couple of days, which is noticeable but not severe enough to be a major complaint.  During the course of Keflex , she experienced diarrhea, which she attributes to her use of Miralax, describing it as 'more liquid than substance.' She denies any known allergies to antibiotics except for amoxicillin . She reports she no longer has her appendix.     Outpatient Medications Prior to Visit  Medication Sig Dispense Refill   acetaminophen  (TYLENOL ) 500 MG tablet Take 1,000 mg by mouth as needed for mild pain (pain score 1-3), headache or fever.     amLODipine -valsartan  (EXFORGE ) 5-160 MG tablet Take 1 tablet by mouth daily. 90 tablet 1   aspirin  EC 81 MG tablet Take 1 tablet (81 mg total) by mouth daily at 6 (six) AM. Please resume on 12/16/2023     Cholecalciferol  (VITAMIN D3) 50 MCG (2000 UT) capsule TAKE 1 CAPSULE (2,000 UNITS TOTAL) BY MOUTH DAILY. 90 capsule 3   citric acid-potassium citrate (POLYCITRA) 1100-334  MG/5ML solution Take 10 mEq by mouth as needed.     clopidogrel  (PLAVIX ) 75 MG tablet Take 1 tablet (75 mg total) by mouth daily. Please resume on 12/14/2023 90 tablet 3   colchicine  0.6 MG tablet Take two tablets prn gout attack.Then take another one in 1-2 hrs. Do not repeat for 3 days. 18 tablet 1   escitalopram  (LEXAPRO ) 10 MG tablet Take 1 tablet (10 mg total) by mouth daily. 90 tablet 1   Evolocumab  (REPATHA  SURECLICK) 140 MG/ML SOAJ Inject 140 mg into the skin every 14 (fourteen) days. 2 mL 6   ezetimibe  (ZETIA ) 10 MG tablet Take 1 tablet (10 mg total) by mouth daily. 90 tablet 3   furosemide  (LASIX ) 20 MG tablet Take 1 tablet (20 mg total) by mouth as needed for edema. 30 tablet 0   metoprolol  succinate (TOPROL -XL) 100 MG 24 hr tablet TAKE 1 TABLET DAILY WITH OR IMMEDIATELY FOLLOWING A MEAL. 90 tablet 3   Multiple Vitamin (MULTIVITAMIN) tablet Take 1 tablet by mouth daily.     Olopatadine  HCl (PATADAY  OP) Place 1 drop into both eyes daily as needed (For dry eyes).     pantoprazole  (PROTONIX ) 40 MG tablet Take 40 mg twice a day for 2 weeks and then once a day 60 tablet 1   triamcinolone  ointment (KENALOG ) 0.5 % Apply 1 Application topically 4 (four) times daily. 60 g 2   cephALEXin  (KEFLEX ) 500 MG capsule Take 1 capsule (500 mg total) by mouth 2 (two) times daily. 14 capsule 1  No facility-administered medications prior to visit.    ROS Review of Systems  Constitutional:  Negative for appetite change, chills, diaphoresis, fatigue and fever.  HENT: Negative.    Eyes: Negative.   Respiratory: Negative.  Negative for cough, chest tightness, shortness of breath and stridor.   Cardiovascular:  Negative for chest pain, palpitations and leg swelling.  Gastrointestinal:  Positive for abdominal pain. Negative for constipation, diarrhea, nausea and vomiting.  Genitourinary:  Positive for dysuria. Negative for decreased urine volume, difficulty urinating, flank pain, frequency, hematuria,  pelvic pain and urgency.  Musculoskeletal:  Positive for back pain. Negative for arthralgias and joint swelling.  Skin: Negative.  Negative for color change and pallor.  Neurological: Negative.  Negative for dizziness, weakness and headaches.  Hematological:  Negative for adenopathy. Does not bruise/bleed easily.  Psychiatric/Behavioral: Negative.      Objective:  BP (!) 144/82   Pulse 62   Temp 98.6 F (37 C)   Ht 5' 6 (1.676 m)   Wt 182 lb 6.4 oz (82.7 kg)   SpO2 97%   BMI 29.44 kg/m   BP Readings from Last 3 Encounters:  02/24/24 (!) 144/82  02/21/24 130/82  02/11/24 130/72    Wt Readings from Last 3 Encounters:  02/24/24 182 lb 6.4 oz (82.7 kg)  02/21/24 179 lb (81.2 kg)  02/11/24 180 lb 6 oz (81.8 kg)    Physical Exam Vitals reviewed.  Constitutional:      Appearance: Normal appearance.  HENT:     Nose: Nose normal.     Mouth/Throat:     Mouth: Mucous membranes are moist.  Eyes:     General: No scleral icterus.    Conjunctiva/sclera: Conjunctivae normal.  Cardiovascular:     Rate and Rhythm: Normal rate and regular rhythm.     Heart sounds: No murmur heard.    No friction rub. No gallop.  Pulmonary:     Effort: Pulmonary effort is normal.     Breath sounds: No stridor. No wheezing, rhonchi or rales.  Abdominal:     General: Abdomen is flat. Bowel sounds are decreased. There is no distension.     Palpations: There is no hepatomegaly, splenomegaly or mass.     Tenderness: There is abdominal tenderness in the right lower quadrant. There is no right CVA tenderness, left CVA tenderness, guarding or rebound.     Hernia: No hernia is present.  Musculoskeletal:        General: Normal range of motion.     Right lower leg: No edema.     Left lower leg: No edema.  Lymphadenopathy:     Cervical: No cervical adenopathy.  Skin:    General: Skin is warm and dry.     Findings: No rash.  Neurological:     General: No focal deficit present.     Mental Status: She  is alert.  Psychiatric:        Mood and Affect: Mood normal.        Behavior: Behavior normal.        Judgment: Judgment normal.     Lab Results  Component Value Date   WBC 4.1 02/24/2024   HGB 12.2 02/24/2024   HCT 37.3 02/24/2024   PLT 291.0 02/24/2024   GLUCOSE 135 (H) 02/24/2024   CHOL 178 09/12/2023   TRIG 111.0 09/12/2023   HDL 67.30 09/12/2023   LDLDIRECT 132.9 04/30/2007   LDLCALC 89 09/12/2023   ALT 18 12/23/2023   AST 23 12/23/2023  NA 141 02/24/2024   K 3.7 02/24/2024   CL 108 02/24/2024   CREATININE 1.08 02/24/2024   BUN 19 02/24/2024   CO2 25 02/24/2024   TSH 1.627 08/12/2023   INR 1.0 12/09/2023   HGBA1C 5.5 02/24/2024    Estimated Creatinine Clearance: 50.3 mL/min (by C-G formula based on SCr of 1.08 mg/dL).   Assessment & Plan:   Acute cystitis without hematuria -     Urinalysis, Routine w reflex microscopic; Future -     CULTURE, URINE COMPREHENSIVE; Future -     Sulfamethoxazole -Trimethoprim ; Take 1 tablet by mouth 2 (two) times daily for 5 days.  Dispense: 10 tablet; Refill: 0  Chronic hyperglycemia -     Basic metabolic panel with GFR; Future -     Hemoglobin A1c; Future  Essential hypertension, benign- BP is adequately well controlled. -     Basic metabolic panel with GFR; Future  Right lower quadrant abdominal tenderness without rebound tenderness- Exam and labs are reassuring. -     Lipase; Future -     CBC with Differential/Platelet; Future -     C-reactive protein; Future  Stage 3a chronic kidney disease (HCC)- Will avoid nephrotoxic agents      Follow-up: Return if symptoms worsen or fail to improve.  Debby Molt, MD "

## 2024-02-24 NOTE — Patient Instructions (Signed)
 Abdominal Pain, Adult  Pain in the abdomen (abdominal pain) can be caused by many things. In most cases, it gets better with no treatment or by being treated at home. But in some cases, it can be serious. Your health care provider will ask questions about your medical history and do a physical exam to try to figure out what is causing your pain. Follow these instructions at home: Medicines Take over-the-counter and prescription medicines only as told by your provider. Do not take medicines that help you poop (laxatives) unless told by your provider. General instructions Watch your condition for any changes. Drink enough fluid to keep your pee (urine) pale yellow. Contact a health care provider if: Your pain changes, gets worse, or lasts longer than expected. You have severe cramping or bloating in your abdomen, or you vomit. Your pain gets worse with meals, after eating, or with certain foods. You are constipated or have diarrhea for more than 2-3 days. You are not hungry, or you lose weight without trying. You have signs of dehydration. These may include: Dark pee, very little pee, or no pee. Cracked lips or dry mouth. Sleepiness or weakness. You have pain when you pee (urinate) or poop. Your abdominal pain wakes you up at night. You have blood in your pee. You have a fever. Get help right away if: You cannot stop vomiting. Your pain is only in one part of the abdomen. Pain on the right side could be caused by appendicitis. You have bloody or black poop (stool), or poop that looks like tar. You have trouble breathing. You have chest pain. These symptoms may be an emergency. Get help right away. Call 911. Do not wait to see if the symptoms will go away. Do not drive yourself to the hospital. This information is not intended to replace advice given to you by your health care provider. Make sure you discuss any questions you have with your health care provider. Document Revised:  12/06/2021 Document Reviewed: 12/06/2021 Elsevier Patient Education  2024 [employer].

## 2024-02-25 ENCOUNTER — Telehealth: Payer: Self-pay

## 2024-02-25 NOTE — Telephone Encounter (Signed)
 Returned her voicemail from 12/19 and left message about upcoming PREP class at [facility]. Asked her to return my call.

## 2024-02-26 LAB — CULTURE, URINE COMPREHENSIVE: RESULT:: NO GROWTH

## 2024-02-28 ENCOUNTER — Ambulatory Visit: Payer: Self-pay

## 2024-02-28 NOTE — Telephone Encounter (Signed)
 Spoke with patient, states she is having pains at the end of her voiding but no other symptoms. She states because Dr.Jones told her there were WBC's in her urine, she felt like she needed a new medication.

## 2024-02-28 NOTE — Telephone Encounter (Signed)
 Which medication ?

## 2024-02-28 NOTE — Telephone Encounter (Signed)
" °  FYI Only or Action Required?: Action required by provider: clinical question for provider, update on patient condition, and alternative medication.  Patient was last seen in primary care on 02/24/2024 by Joshua Debby CROME, MD.  Called Nurse Triage reporting Allergic Reaction.  Symptoms began several days ago.  Interventions attempted: Other: Discontinued Bactrim .  Symptoms are: gradually worsening.  Triage Disposition: See Physician Within 24 Hours  Patient/caregiver understands and will follow disposition?: Yes   Copied from CRM #[id number]. Topic: Clinical - Red Word Triage >> Feb 28, 2024 12:35 PM Berneda FALCON wrote: Red Word that prompted transfer to Nurse Triage: Patient would like to talk to a nurse about the medication that PCP prescribed on Monday. She took it Monday but not since then. She states she is having an allergic reaction to the medication. States that her arms feel tingly and numb.  Medication: sulfamethoxazole -trimethoprim  (BACTRIM  DS) 800-160 MG tablet Reason for Disposition  Nursing judgment or information in reference  Answer Assessment - Initial Assessment Questions 1. REASON FOR CALL: What is your main concern right now?      Allergic reaction to Bactrim . Took two doses since the 23 rd  2. ONSET: When did the Swelling start?      X 3 days  3. SEVERITY: How bad is the  swelling ?      Can feel swelling. Denies visible swellings  4. FUNCTIONAL IMPAIRMENT: How have things been going for you overall? Have you had more difficulty than usual doing your normal daily activities? (e.g., self-care, school, work, interactions)      States can still use arms  5. RELIEVING AND AGGRAVATING FACTORS: What makes it better or worse? (e.g., certain activities, rest)        6. FEVER: denies        7. OTHER SYMPTOMS: Do you have any other new symptoms?      Denies tongue swelling, difficulty breathing difficulty swallowing Pt states she is still having UTI  symptoms, pain at the end of her void  8. TREATMENTS AND RESPONSE: What have you done so far to try to make this better? What medicines have you used?    Benadryl     Pt report allergic reaction, arm tightness. Pt is taking OTC Benadryl . Pt states she has only taken 2 doses of Bactrim  due to symptoms. Pt states symptoms have resolved once she discontinued Pt advised to keep arms at or above heart level Pt offered an in clinic visit for sooner available appt on Monday 12.29.25 Pt states she does not want to come in if she does not have to and would prefer to have an alternative antibiotic sent now that her symptoms of arm pressure as resolved.  Pt states she may have an allergy to Sulfa  Pt agrees with plan of care, will call back for any worsening symptoms  Protocols used: No Guideline Available-[facility]  "

## 2024-03-04 ENCOUNTER — Ambulatory Visit (HOSPITAL_BASED_OUTPATIENT_CLINIC_OR_DEPARTMENT_OTHER): Admitting: Family

## 2024-03-04 ENCOUNTER — Encounter (HOSPITAL_BASED_OUTPATIENT_CLINIC_OR_DEPARTMENT_OTHER): Payer: Self-pay | Admitting: Family

## 2024-03-04 VITALS — BP 134/80 | HR 59 | Ht 66.0 in | Wt 179.4 lb

## 2024-03-04 DIAGNOSIS — M791 Myalgia, unspecified site: Secondary | ICD-10-CM

## 2024-03-04 DIAGNOSIS — T466X5D Adverse effect of antihyperlipidemic and antiarteriosclerotic drugs, subsequent encounter: Secondary | ICD-10-CM

## 2024-03-04 DIAGNOSIS — I2584 Coronary atherosclerosis due to calcified coronary lesion: Secondary | ICD-10-CM | POA: Diagnosis not present

## 2024-03-04 DIAGNOSIS — I25118 Atherosclerotic heart disease of native coronary artery with other forms of angina pectoris: Secondary | ICD-10-CM

## 2024-03-04 DIAGNOSIS — E785 Hyperlipidemia, unspecified: Secondary | ICD-10-CM | POA: Diagnosis not present

## 2024-03-04 DIAGNOSIS — Z79899 Other long term (current) drug therapy: Secondary | ICD-10-CM

## 2024-03-04 DIAGNOSIS — T466X5A Adverse effect of antihyperlipidemic and antiarteriosclerotic drugs, initial encounter: Secondary | ICD-10-CM

## 2024-03-04 NOTE — Patient Instructions (Addendum)
 Medication Instructions:   Recommend taking your Lasix  and Potassium for 2 days. If your shortness of breath does not improve recommend calling primary care to follow up on shortness of breath.   Recommend trying Melatonin 5-10mg  about 30 minutes before bed to help with sleep If this does not improve, sleep recommend talking with primary care about alternatives   Labwork: Your physician recommends that you return for lab work today for cholesterol check--NMR LIPOPROFILE AND LIPOPROTEIN A   Follow-Up: Please follow up in 6 months in [facility] with Dr. Raford, Reche Finder, NP or Allean Mink PharmD    Special Instructions:    If your blood pressure at home is consistently more than 135/85 simply send us  a MyChart message. If it is, we may change your Amlodipine -Valsartan  from 5-160mg  dose to 10-160mg  dose.
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
## 2024-03-05 LAB — NMR, LIPOPROFILE
Cholesterol, Total: 173 mg/dL (ref 100–199)
HDL Particle Number: 41.2 umol/L
HDL-C: 64 mg/dL
LDL Particle Number: 909 nmol/L
LDL Size: 20.8 nm
LDL-C (NIH Calc): 85 mg/dL (ref 0–99)
LP-IR Score: 59 — ABNORMAL HIGH
Small LDL Particle Number: 410 nmol/L
Triglycerides: 137 mg/dL (ref 0–149)

## 2024-03-05 LAB — LIPOPROTEIN A (LPA): Lipoprotein (a): 58.2 nmol/L

## 2024-03-09 ENCOUNTER — Telehealth: Payer: Self-pay

## 2024-03-09 ENCOUNTER — Telehealth: Payer: Self-pay | Admitting: *Deleted

## 2024-03-09 ENCOUNTER — Other Ambulatory Visit: Payer: Self-pay

## 2024-03-09 ENCOUNTER — Encounter (HOSPITAL_COMMUNITY): Payer: Self-pay

## 2024-03-09 ENCOUNTER — Emergency Department (HOSPITAL_COMMUNITY)
Admission: EM | Admit: 2024-03-09 | Discharge: 2024-03-10 | Discharge status: Against Medical Advice | Attending: Emergency Medicine | Admitting: Emergency Medicine

## 2024-03-09 DIAGNOSIS — R110 Nausea: Secondary | ICD-10-CM | POA: Insufficient documentation

## 2024-03-09 DIAGNOSIS — Z5321 Procedure and treatment not carried out due to patient leaving prior to being seen by health care provider: Secondary | ICD-10-CM | POA: Insufficient documentation

## 2024-03-09 DIAGNOSIS — K625 Hemorrhage of anus and rectum: Secondary | ICD-10-CM | POA: Insufficient documentation

## 2024-03-09 LAB — COMPREHENSIVE METABOLIC PANEL WITH GFR
ALT: 25 U/L (ref 0–44)
AST: 22 U/L (ref 15–41)
Albumin: 4.3 g/dL (ref 3.5–5.0)
Alkaline Phosphatase: 92 U/L (ref 38–126)
Anion gap: 12 (ref 5–15)
BUN: 20 mg/dL (ref 8–23)
CO2: 24 mmol/L (ref 22–32)
Calcium: 9.5 mg/dL (ref 8.9–10.3)
Chloride: 107 mmol/L (ref 98–111)
Creatinine, Ser: 1.1 mg/dL — ABNORMAL HIGH (ref 0.44–1.00)
GFR, Estimated: 53 mL/min — ABNORMAL LOW
Glucose, Bld: 102 mg/dL — ABNORMAL HIGH (ref 70–99)
Potassium: 4.1 mmol/L (ref 3.5–5.1)
Sodium: 143 mmol/L (ref 135–145)
Total Bilirubin: 0.2 mg/dL (ref 0.0–1.2)
Total Protein: 7.1 g/dL (ref 6.5–8.1)

## 2024-03-09 LAB — CBC
HCT: 39.8 % (ref 36.0–46.0)
Hemoglobin: 12.7 g/dL (ref 12.0–15.0)
MCH: 26.1 pg (ref 26.0–34.0)
MCHC: 31.9 g/dL (ref 30.0–36.0)
MCV: 81.9 fL (ref 80.0–100.0)
Platelets: 351 K/uL (ref 150–400)
RBC: 4.86 MIL/uL (ref 3.87–5.11)
RDW: 20.9 % — ABNORMAL HIGH (ref 11.5–15.5)
WBC: 5 K/uL (ref 4.0–10.5)
nRBC: 0 % (ref 0.0–0.2)

## 2024-03-09 LAB — PROTIME-INR
INR: 0.9 (ref 0.8–1.2)
Prothrombin Time: 12.8 s (ref 11.4–15.2)

## 2024-03-09 LAB — TYPE AND SCREEN
ABO/RH(D): A POS
Antibody Screen: NEGATIVE

## 2024-03-09 NOTE — ED Triage Notes (Signed)
 Positive bloody stool on the toilet when she use bathroom.

## 2024-03-09 NOTE — Telephone Encounter (Signed)
 Spoke with patient per regarding her [facility] message.  Patient had a black stool with blood in toilet. 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 Patient has not had any more episodes since early this morning.     BP lower than normal in low 100s/80s Advised patient to hold iron  to see if stools lighten in color and  Drink plenty of fluids. Appointment made with Harlene on 1/8 @ 10:20 Patient instructed to call us  or go to ED if she experiences recurrent  rectal bleeding  associated with more bloody or black stools, worsening weakness or faint-feeling.  Advised patient to avoid strenous activies until seen. Patient voiced understanding.

## 2024-03-09 NOTE — Telephone Encounter (Deleted)
 Spoke with patient per regarding her [facility] message.  Patient had a black stool with blood in toilet. 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 Patient has not had any more episodes since early this morning.     BP lower than normal in low 100s/80s Advised patient to hold iron  to see if stools lighten in color and  Drink plenty of fluids. Appointment made with Harlene on 1/8 @ 10:20 Patient instructed to call us  or go to ED if she experiences recurrent  rectal bleeding  associated with more bloody or black stools, worsening weakness or faint-feeling.  Advised patient to avoid strenous activies until seen. Patient voiced understanding.

## 2024-03-09 NOTE — Telephone Encounter (Signed)
 Sent text about upcoming PREP class starting tomorrow 1/6 at [facility].

## 2024-03-09 NOTE — ED Triage Notes (Signed)
 Pt c.o bright red rectal bleeding since 2am this morning, pt has hx of diverticulitis and blood transfusions. Pt c.o mild abd pain and nausea.

## 2024-03-10 ENCOUNTER — Inpatient Hospital Stay (HOSPITAL_BASED_OUTPATIENT_CLINIC_OR_DEPARTMENT_OTHER)
Admission: EM | Admit: 2024-03-10 | Discharge: 2024-03-14 | DRG: 378 | Disposition: A | Discharge status: Home / Self Care | Attending: Family Medicine | Admitting: Family Medicine

## 2024-03-10 ENCOUNTER — Encounter (HOSPITAL_BASED_OUTPATIENT_CLINIC_OR_DEPARTMENT_OTHER): Payer: Self-pay

## 2024-03-10 ENCOUNTER — Telehealth: Payer: Self-pay

## 2024-03-10 ENCOUNTER — Other Ambulatory Visit: Payer: Self-pay

## 2024-03-10 ENCOUNTER — Ambulatory Visit: Payer: Self-pay | Admitting: Internal Medicine

## 2024-03-10 DIAGNOSIS — D62 Acute posthemorrhagic anemia: Secondary | ICD-10-CM | POA: Diagnosis present

## 2024-03-10 DIAGNOSIS — D509 Iron deficiency anemia, unspecified: Secondary | ICD-10-CM | POA: Diagnosis present

## 2024-03-10 DIAGNOSIS — N1831 Chronic kidney disease, stage 3a: Secondary | ICD-10-CM | POA: Diagnosis present

## 2024-03-10 DIAGNOSIS — N183 Chronic kidney disease, stage 3 unspecified: Secondary | ICD-10-CM | POA: Diagnosis present

## 2024-03-10 DIAGNOSIS — K219 Gastro-esophageal reflux disease without esophagitis: Secondary | ICD-10-CM | POA: Diagnosis present

## 2024-03-10 DIAGNOSIS — Z7902 Long term (current) use of antithrombotics/antiplatelets: Secondary | ICD-10-CM

## 2024-03-10 DIAGNOSIS — Z860100 Personal history of colon polyps, unspecified: Secondary | ICD-10-CM

## 2024-03-10 DIAGNOSIS — Z9841 Cataract extraction status, right eye: Secondary | ICD-10-CM

## 2024-03-10 DIAGNOSIS — Q2730 Arteriovenous malformation, site unspecified: Secondary | ICD-10-CM

## 2024-03-10 DIAGNOSIS — I129 Hypertensive chronic kidney disease with stage 1 through stage 4 chronic kidney disease, or unspecified chronic kidney disease: Secondary | ICD-10-CM | POA: Diagnosis present

## 2024-03-10 DIAGNOSIS — K921 Melena: Secondary | ICD-10-CM

## 2024-03-10 DIAGNOSIS — K922 Gastrointestinal hemorrhage, unspecified: Secondary | ICD-10-CM | POA: Diagnosis not present

## 2024-03-10 DIAGNOSIS — D6832 Hemorrhagic disorder due to extrinsic circulating anticoagulants: Secondary | ICD-10-CM | POA: Diagnosis present

## 2024-03-10 DIAGNOSIS — E559 Vitamin D deficiency, unspecified: Secondary | ICD-10-CM | POA: Diagnosis present

## 2024-03-10 DIAGNOSIS — Z880 Allergy status to penicillin: Secondary | ICD-10-CM

## 2024-03-10 DIAGNOSIS — F419 Anxiety disorder, unspecified: Secondary | ICD-10-CM | POA: Diagnosis present

## 2024-03-10 DIAGNOSIS — Z7982 Long term (current) use of aspirin: Secondary | ICD-10-CM | POA: Diagnosis not present

## 2024-03-10 DIAGNOSIS — E785 Hyperlipidemia, unspecified: Secondary | ICD-10-CM | POA: Diagnosis present

## 2024-03-10 DIAGNOSIS — Z8249 Family history of ischemic heart disease and other diseases of the circulatory system: Secondary | ICD-10-CM | POA: Diagnosis not present

## 2024-03-10 DIAGNOSIS — Z8711 Personal history of peptic ulcer disease: Secondary | ICD-10-CM

## 2024-03-10 DIAGNOSIS — I2510 Atherosclerotic heart disease of native coronary artery without angina pectoris: Secondary | ICD-10-CM | POA: Diagnosis present

## 2024-03-10 DIAGNOSIS — I7300 Raynaud's syndrome without gangrene: Secondary | ICD-10-CM | POA: Diagnosis present

## 2024-03-10 DIAGNOSIS — Z882 Allergy status to sulfonamides status: Secondary | ICD-10-CM

## 2024-03-10 DIAGNOSIS — K449 Diaphragmatic hernia without obstruction or gangrene: Secondary | ICD-10-CM | POA: Diagnosis present

## 2024-03-10 DIAGNOSIS — T45525A Adverse effect of antithrombotic drugs, initial encounter: Secondary | ICD-10-CM | POA: Diagnosis present

## 2024-03-10 DIAGNOSIS — Z604 Social exclusion and rejection: Secondary | ICD-10-CM | POA: Diagnosis present

## 2024-03-10 DIAGNOSIS — Z888 Allergy status to other drugs, medicaments and biological substances status: Secondary | ICD-10-CM

## 2024-03-10 DIAGNOSIS — Z90710 Acquired absence of both cervix and uterus: Secondary | ICD-10-CM

## 2024-03-10 DIAGNOSIS — Z8673 Personal history of transient ischemic attack (TIA), and cerebral infarction without residual deficits: Secondary | ICD-10-CM | POA: Diagnosis not present

## 2024-03-10 DIAGNOSIS — K31819 Angiodysplasia of stomach and duodenum without bleeding: Secondary | ICD-10-CM | POA: Diagnosis present

## 2024-03-10 DIAGNOSIS — Z87891 Personal history of nicotine dependence: Secondary | ICD-10-CM

## 2024-03-10 DIAGNOSIS — K625 Hemorrhage of anus and rectum: Secondary | ICD-10-CM | POA: Diagnosis present

## 2024-03-10 DIAGNOSIS — Z66 Do not resuscitate: Secondary | ICD-10-CM | POA: Diagnosis present

## 2024-03-10 DIAGNOSIS — K5731 Diverticulosis of large intestine without perforation or abscess with bleeding: Principal | ICD-10-CM | POA: Diagnosis present

## 2024-03-10 DIAGNOSIS — M109 Gout, unspecified: Secondary | ICD-10-CM | POA: Diagnosis present

## 2024-03-10 DIAGNOSIS — K648 Other hemorrhoids: Secondary | ICD-10-CM | POA: Diagnosis not present

## 2024-03-10 DIAGNOSIS — I10 Essential (primary) hypertension: Secondary | ICD-10-CM | POA: Diagnosis not present

## 2024-03-10 DIAGNOSIS — Z881 Allergy status to other antibiotic agents status: Secondary | ICD-10-CM

## 2024-03-10 DIAGNOSIS — Z8719 Personal history of other diseases of the digestive system: Secondary | ICD-10-CM

## 2024-03-10 DIAGNOSIS — Z91410 Personal history of adult physical and sexual abuse: Secondary | ICD-10-CM

## 2024-03-10 DIAGNOSIS — J449 Chronic obstructive pulmonary disease, unspecified: Secondary | ICD-10-CM | POA: Diagnosis present

## 2024-03-10 DIAGNOSIS — E66811 Obesity, class 1: Secondary | ICD-10-CM | POA: Diagnosis present

## 2024-03-10 DIAGNOSIS — K649 Unspecified hemorrhoids: Secondary | ICD-10-CM

## 2024-03-10 DIAGNOSIS — Z6830 Body mass index (BMI) 30.0-30.9, adult: Secondary | ICD-10-CM

## 2024-03-10 DIAGNOSIS — Z79899 Other long term (current) drug therapy: Secondary | ICD-10-CM

## 2024-03-10 DIAGNOSIS — R7302 Impaired glucose tolerance (oral): Secondary | ICD-10-CM | POA: Diagnosis present

## 2024-03-10 DIAGNOSIS — F32A Depression, unspecified: Secondary | ICD-10-CM | POA: Diagnosis present

## 2024-03-10 DIAGNOSIS — T39015A Adverse effect of aspirin, initial encounter: Secondary | ICD-10-CM | POA: Diagnosis present

## 2024-03-10 DIAGNOSIS — Z886 Allergy status to analgesic agent status: Secondary | ICD-10-CM

## 2024-03-10 DIAGNOSIS — Z818 Family history of other mental and behavioral disorders: Secondary | ICD-10-CM

## 2024-03-10 DIAGNOSIS — Z83438 Family history of other disorder of lipoprotein metabolism and other lipidemia: Secondary | ICD-10-CM

## 2024-03-10 DIAGNOSIS — K5730 Diverticulosis of large intestine without perforation or abscess without bleeding: Secondary | ICD-10-CM | POA: Diagnosis not present

## 2024-03-10 DIAGNOSIS — E538 Deficiency of other specified B group vitamins: Secondary | ICD-10-CM | POA: Diagnosis present

## 2024-03-10 DIAGNOSIS — Z885 Allergy status to narcotic agent status: Secondary | ICD-10-CM

## 2024-03-10 LAB — TYPE AND SCREEN
ABO/RH(D): A POS
Antibody Screen: NEGATIVE

## 2024-03-10 LAB — HEMOGLOBIN AND HEMATOCRIT, BLOOD
HCT: 37.8 % (ref 36.0–46.0)
HCT: 32.3 % — ABNORMAL LOW (ref 36.0–46.0)
HCT: 33.1 % — ABNORMAL LOW (ref 36.0–46.0)
HCT: 33.2 % — ABNORMAL LOW (ref 36.0–46.0)
Hemoglobin: 12.3 g/dL (ref 12.0–15.0)
Hemoglobin: 10.6 g/dL — ABNORMAL LOW (ref 12.0–15.0)
Hemoglobin: 10.8 g/dL — ABNORMAL LOW (ref 12.0–15.0)
Hemoglobin: 11 g/dL — ABNORMAL LOW (ref 12.0–15.0)

## 2024-03-10 LAB — PROTIME-INR
INR: 1 (ref 0.8–1.2)
Prothrombin Time: 13.8 s (ref 11.4–15.2)

## 2024-03-10 LAB — OCCULT BLOOD X 1 CARD TO LAB, STOOL: Fecal Occult Bld: POSITIVE — AB

## 2024-03-10 LAB — APTT: aPTT: 34 s (ref 24–36)

## 2024-03-10 MED ORDER — DEXTROSE IN LACTATED RINGERS 5 % IV SOLN: INTRAVENOUS | Status: AC

## 2024-03-10 MED ORDER — AMLODIPINE BESYLATE-VALSARTAN 5-160 MG PO TABS: 1.0000 | ORAL_TABLET | Freq: Every day | ORAL | Status: DC

## 2024-03-10 MED ORDER — ESCITALOPRAM OXALATE 10 MG PO TABS
10.0000 mg | ORAL_TABLET | Freq: Every day | ORAL | Status: DC
  Administered 2024-03-10 – 2024-03-14 (×5): 10 mg via ORAL
  Filled 2024-03-10 (×5): qty 1

## 2024-03-10 MED ORDER — PANTOPRAZOLE SODIUM 40 MG IV SOLR
40.0000 mg | Freq: Two times a day (BID) | INTRAVENOUS | Status: DC
  Administered 2024-03-10 – 2024-03-14 (×9): 40 mg via INTRAVENOUS
  Filled 2024-03-10 (×9): qty 10

## 2024-03-10 MED ORDER — METOPROLOL SUCCINATE ER 50 MG PO TB24
50.0000 mg | ORAL_TABLET | Freq: Every day | ORAL | Status: DC
  Administered 2024-03-10 – 2024-03-14 (×5): 50 mg via ORAL
  Filled 2024-03-10 (×5): qty 1

## 2024-03-10 MED ORDER — AMLODIPINE BESYLATE 5 MG PO TABS: 5.0000 mg | ORAL_TABLET | Freq: Every day | ORAL | Status: DC

## 2024-03-10 MED ORDER — EZETIMIBE 10 MG PO TABS
10.0000 mg | ORAL_TABLET | Freq: Every day | ORAL | Status: AC
  Administered 2024-03-10 – 2024-03-14 (×5): 10 mg via ORAL
  Filled 2024-03-10 (×5): qty 1

## 2024-03-10 MED ORDER — COLCHICINE 0.6 MG PO TABS
0.6000 mg | ORAL_TABLET | Freq: Every day | ORAL | Status: AC
  Administered 2024-03-10 – 2024-03-14 (×5): 0.6 mg via ORAL
  Filled 2024-03-10 (×5): qty 1

## 2024-03-10 MED ORDER — IPRATROPIUM-ALBUTEROL 0.5-2.5 (3) MG/3ML IN SOLN: 3.0000 mL | Freq: Four times a day (QID) | RESPIRATORY_TRACT | Status: DC | PRN

## 2024-03-10 MED ORDER — ACETAMINOPHEN 325 MG PO TABS: 650.0000 mg | ORAL_TABLET | Freq: Four times a day (QID) | ORAL | Status: DC | PRN

## 2024-03-10 MED ORDER — IRBESARTAN 150 MG PO TABS: 150.0000 mg | ORAL_TABLET | Freq: Every day | ORAL | Status: DC
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
## 2024-03-10 NOTE — Plan of Care (Signed)
  Problem: Clinical Measurements: Goal: Cardiovascular complication will be avoided Outcome: Progressing   Problem: Activity: Goal: Risk for activity intolerance will decrease Outcome: Progressing   Problem: Nutrition: Goal: Adequate nutrition will be maintained Outcome: Progressing   Problem: Coping: Goal: Level of anxiety will decrease Outcome: Progressing   Problem: Elimination: Goal: Will not experience complications related to urinary retention Outcome: Progressing   Problem: Pain Managment: Goal: General experience of comfort will improve and/or be controlled Outcome: Progressing

## 2024-03-10 NOTE — ED Notes (Signed)
 Pt leaving due to wait and will seek medical attention somewhere else

## 2024-03-10 NOTE — Plan of Care (Addendum)
 [facility] emergency department [facility] or [facility] telemetry bed transfer:  74 year old female history of CKD, COPD, CVA, hypertension, carotid stenosis and thrombosis s/p right carotid artery endarterectomy and  history of GI bleed secondary to AVM and gastric ulceration presented emergency department complaining of rectal bleeding. Patient reported that noticing bright red blood in toilet starting about 24hrs prior to arrival and recurring 4-5 times through the day. She went to [facility] initially where labs were drawn but left due to wait times. Hgb then was 12.7, other labs reassuring. Denies lightheadedness or dizziness, fever and abdominal pain.  Patient being admitted in October 2025 with similar presentation underwent EGD which showed angiodysplasia of the stomach and duodenum status post argon plasma coagulation.  At presentation to ED patient is hemodynamically stable. Lab work, CBC showing stable H&H normal WBC and platelet count.  Normal pro time INR. CMP unremarkable renal function at baseline.  Per EDP rectal exam showed maroon-colored stool in the rectal vault. FOBT positive.  In the ED pt started on IV Protonix  40 mg twice daily  Dr. Roselyn consulted [facility] Dr. Wilhelmenia recommended to start IV Protonix , keep patient n.p.o. and GI will evaluate patient soon.  Per GI patient can be admitted either [facility] or [facility].   Hospitalist consulted for further evaluation management of GI bleed.   [facility] will assume care on arrival to accepting facility. Until arrival, care as per EDP. However, [facility] available 24/7 for questions and assistance. Check [email or web address] for on-call coverage. Nursing staff, please call [facility] Admits & Consults System-Wide number under Amion on patient's arrival so appropriate admitting provider can evaluate the pt.   Author: Yuval Nolet, MD  [facility]

## 2024-03-10 NOTE — ED Triage Notes (Signed)
 Pt presents via POV c/o rectal bleeding. Reports bright red. Started yesterday am. Seen at [facility] for same earlier but left due to wait time.

## 2024-03-10 NOTE — Telephone Encounter (Signed)
 Copied from CRM [id number]. Topic: General - Other >> Mar 10, 2024  2:22 PM Macario HERO wrote: Reason for CRM: Patient called to let provider know she is currently being admitted to [facility] due to rectal bleeding and they do not know the location of it.

## 2024-03-10 NOTE — ED Notes (Signed)
 Called [facility] to transport the patient to [facility] rm# [id number]

## 2024-03-10 NOTE — Plan of Care (Signed)

## 2024-03-10 NOTE — ED Provider Notes (Signed)
 "  [place] [facility] AT [facility]  Provider Note  CSN: [id number] Arrival date & time: 03/10/24 0435  History Chief Complaint  Patient presents with   Rectal Bleeding    Judy Carlson is a 74 y.o. 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 She went to [facility] initially where labs were drawn but left due to wait times. Hgb then was 12.7, other labs reassuring. Denies lightheadedness or dizziness. She is on Plavix  after carotid endarterectomy. No fever or abdominal pains.    Home Medications Prior to Admission medications  Medication Sig Start Date End Date Taking? Authorizing Provider  acetaminophen  (TYLENOL ) 500 MG tablet Take 1,000 mg by mouth as needed for mild pain (pain score 1-3), headache or fever.    [provider]  amLODipine -valsartan  (EXFORGE ) 5-160 MG tablet Take 1 tablet by mouth daily. 12/27/23   Vannie Reche GORMAN, NP  aspirin  EC 81 MG tablet Take 1 tablet (81 mg total) by mouth daily at 6 (six) AM. Please resume on 12/16/2023 12/16/23   Krishnan, Gokul, MD  Cholecalciferol  (VITAMIN D3) 50 MCG (2000 UT) capsule TAKE 1 CAPSULE (2,000 UNITS TOTAL) BY MOUTH DAILY. 03/14/22   Plotnikov, Aleksei V, MD  citric acid-potassium citrate (POLYCITRA) 1100-334 MG/5ML solution Take 10 mEq by mouth as needed.    [provider]  clopidogrel  (PLAVIX ) 75 MG tablet Take 1 tablet (75 mg total) by mouth daily. Please resume on 12/14/2023 12/14/23   Krishnan, Gokul, MD  colchicine  0.6 MG tablet Take two tablets prn gout attack.Then take another one in 1-2 hrs. Do not repeat for 3 days. 12/19/23   Plotnikov, Karlynn GAILS, MD  escitalopram  (LEXAPRO ) 10 MG tablet Take 1 tablet  (10 mg total) by mouth daily. 02/03/24 02/02/25  Plotnikov, Karlynn GAILS, MD  Evolocumab  (REPATHA  SURECLICK) 140 MG/ML SOAJ Inject 140 mg into the skin every 14 (fourteen) days. 11/22/23   Hilty, Vinie BROCKS, MD  ezetimibe  (ZETIA ) 10 MG tablet Take 1 tablet (10 mg total) by mouth daily. 08/12/23   Plotnikov, Aleksei V, MD  FERREX 150 150 MG capsule Take 150 mg by mouth daily. 02/14/24   [provider]  furosemide  (LASIX ) 20 MG tablet Take 1 tablet (20 mg total) by mouth as needed for edema. 01/07/24   Walker, [name], NP  metoprolol  succinate (TOPROL -XL) 100 MG 24 hr tablet TAKE 1 TABLET DAILY WITH OR IMMEDIATELY FOLLOWING A MEAL. 07/23/23   Plotnikov, Aleksei V, MD  Multiple Vitamin (MULTIVITAMIN) tablet Take 1 tablet by mouth daily.    [provider]  Olopatadine  HCl (PATADAY  OP) Place 1 drop into both eyes daily as needed (For dry eyes).    [provider]  pantoprazole  (PROTONIX ) 40 MG tablet Take 40 mg twice a day for 2 weeks and then once a day 12/12/23   Krishnan, Gokul, MD  triamcinolone  ointment (KENALOG ) 0.5 % Apply 1 Application topically 4 (four) times daily. 02/03/24 02/02/25  Plotnikov, Karlynn GAILS, MD     Allergies    Bactrim  [sulfamethoxazole -trimethoprim ], Crestor  [rosuvastatin  calcium ], Hydrocodone , Lipitor [atorvastatin ], Lisinopril, Naproxen, Nsaids, Pravastatin , Prednisone , Amoxicillin , Clindamycin /lincomycin, Doxycycline, and Lovastatin    Review of Systems   Review of Systems Please see HPI for pertinent positives and  negatives  Physical Exam BP 125/81   Pulse 76   Temp 97.8 F (36.6 C)   Resp 16   SpO2 99%   Physical Exam Vitals and nursing note reviewed. Exam conducted with a chaperone present.  Constitutional:      Appearance: Normal appearance.  HENT:     Head: Normocephalic and atraumatic.     Nose: Nose normal.     Mouth/Throat:     Mouth: Mucous membranes are moist.  Eyes:     Extraocular Movements: Extraocular movements intact.      Conjunctiva/sclera: Conjunctivae normal.  Cardiovascular:     Rate and Rhythm: Normal rate.  Pulmonary:     Effort: Pulmonary effort is normal.     Breath sounds: Normal breath sounds.  Abdominal:     General: Abdomen is flat.     Palpations: Abdomen is soft.     Tenderness: There is no abdominal tenderness.  Genitourinary:    Comments: Small non-bleeding fissure at 6 o'clock, stool is maroon on digital exam, no hemorroids Musculoskeletal:        General: No swelling. Normal range of motion.     Cervical back: Neck supple.  Skin:    General: Skin is warm and dry.  Neurological:     General: No focal deficit present.     Mental Status: She is alert.  Psychiatric:        Mood and Affect: Mood normal.     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 Vitals are reassuring, recheck of H/H on arrival here not significantly changed. Not in need of emergent transfusion but will plan admission for further evaluation. Patient is amenable.   ED Course   Clinical Course as of 03/10/24 0613  Tue Mar 10, 2024  0608 Dr. Wilhel[place]ia with GI aware of patient. He requests Protonix  BID and they will see when she gets to inpatient bed.  [CS]  0612 Spoke with Dr. Sundil, Hospitalist, who will accept for admission.  [CS]    Clinical Course User Index [CS] Roselyn Carlin NOVAK, MD     MDM Rules/Calculators/A&P Medical Decision Making Problems Addressed: Gastrointestinal hemorrhage, unspecified gastrointestinal hemorrhage type: acute illness or injury  Amount and/or Complexity of Data Reviewed Labs: ordered. Decision-making details documented in ED Course.  Risk Prescription drug  management. Decision regarding hospitalization.     Final Clinical Impression(s) / ED Diagnoses Final diagnoses:  Gastrointestinal hemorrhage, unspecified gastrointestinal hemorrhage type    Rx / DC Orders ED Discharge Orders     None        Roselyn Carlin NOVAK, MD 03/10/24 [phone]  "

## 2024-03-11 ENCOUNTER — Encounter (HOSPITAL_COMMUNITY): Payer: Self-pay | Admitting: Internal Medicine

## 2024-03-11 ENCOUNTER — Encounter: Payer: Self-pay | Admitting: Internal Medicine

## 2024-03-11 DIAGNOSIS — K922 Gastrointestinal hemorrhage, unspecified: Secondary | ICD-10-CM

## 2024-03-11 DIAGNOSIS — D62 Acute posthemorrhagic anemia: Secondary | ICD-10-CM | POA: Diagnosis not present

## 2024-03-11 DIAGNOSIS — K219 Gastro-esophageal reflux disease without esophagitis: Secondary | ICD-10-CM | POA: Diagnosis not present

## 2024-03-11 DIAGNOSIS — N183 Chronic kidney disease, stage 3 unspecified: Secondary | ICD-10-CM

## 2024-03-11 DIAGNOSIS — Z7902 Long term (current) use of antithrombotics/antiplatelets: Secondary | ICD-10-CM | POA: Diagnosis not present

## 2024-03-11 DIAGNOSIS — Q2730 Arteriovenous malformation, site unspecified: Secondary | ICD-10-CM | POA: Diagnosis not present

## 2024-03-11 DIAGNOSIS — K921 Melena: Secondary | ICD-10-CM

## 2024-03-11 DIAGNOSIS — N1831 Chronic kidney disease, stage 3a: Secondary | ICD-10-CM | POA: Diagnosis not present

## 2024-03-11 DIAGNOSIS — Z8673 Personal history of transient ischemic attack (TIA), and cerebral infarction without residual deficits: Secondary | ICD-10-CM

## 2024-03-11 DIAGNOSIS — I10 Essential (primary) hypertension: Secondary | ICD-10-CM | POA: Diagnosis not present

## 2024-03-11 DIAGNOSIS — E66811 Obesity, class 1: Secondary | ICD-10-CM | POA: Insufficient documentation

## 2024-03-11 LAB — COMPREHENSIVE METABOLIC PANEL WITH GFR
ALT: 16 U/L (ref 0–44)
AST: 19 U/L (ref 15–41)
Albumin: 3.6 g/dL (ref 3.5–5.0)
Alkaline Phosphatase: 72 U/L (ref 38–126)
Anion gap: 11 (ref 5–15)
BUN: 18 mg/dL (ref 8–23)
CO2: 22 mmol/L (ref 22–32)
Calcium: 9.3 mg/dL (ref 8.9–10.3)
Chloride: 111 mmol/L (ref 98–111)
Creatinine, Ser: 1.15 mg/dL — ABNORMAL HIGH (ref 0.44–1.00)
GFR, Estimated: 50 mL/min — ABNORMAL LOW
Glucose, Bld: 109 mg/dL — ABNORMAL HIGH (ref 70–99)
Potassium: 4.3 mmol/L (ref 3.5–5.1)
Sodium: 144 mmol/L (ref 135–145)
Total Bilirubin: 0.3 mg/dL (ref 0.0–1.2)
Total Protein: 6 g/dL — ABNORMAL LOW (ref 6.5–8.1)

## 2024-03-11 LAB — CBC
HCT: 31.1 % — ABNORMAL LOW (ref 36.0–46.0)
Hemoglobin: 10 g/dL — ABNORMAL LOW (ref 12.0–15.0)
MCH: 26.2 pg (ref 26.0–34.0)
MCHC: 32.2 g/dL (ref 30.0–36.0)
MCV: 81.6 fL (ref 80.0–100.0)
Platelets: 280 K/uL (ref 150–400)
RBC: 3.81 MIL/uL — ABNORMAL LOW (ref 3.87–5.11)
RDW: 20.3 % — ABNORMAL HIGH (ref 11.5–15.5)
WBC: 4.9 K/uL (ref 4.0–10.5)
nRBC: 0 % (ref 0.0–0.2)

## 2024-03-11 NOTE — Progress Notes (Signed)
" °   03/11/24 0850  TOC Brief Assessment  Insurance and Status Reviewed  Patient has primary care physician Yes (Plotnikov, Karlynn GAILS, MD)  Home environment has been reviewed from home  Prior level of function: Independent  Prior/Current Home Services No current home services  Social Drivers of Health Review SDOH reviewed no interventions necessary  Readmission risk has been reviewed Yes  Transition of care needs no transition of care needs at this time    "

## 2024-03-11 NOTE — Assessment & Plan Note (Signed)
 No evidence of flare - Continue home DuoNeb

## 2024-03-11 NOTE — Assessment & Plan Note (Signed)
 CKD stage IIIa, creatinine stable relative to baseline 1.1

## 2024-03-11 NOTE — Progress Notes (Signed)
" °  Progress Note   Patient: Judy Carlson FMW:[id number] DOB: Jul 02, 1950 DOA: 03/10/2024     1 DOS: the patient was seen and examined on 03/11/2024 at 11:01 AM      Brief hospital course: 74 y.o. F with HTN, CKD IIIa baseline 1.1, hx CVA, PVD s/p R CEA and long-standing IDA due to GI blood loss, duodenitis, diverticular disease and known intestinal AVMs who presented with red bowel movements.     Assessment and Plan: GI bleed Hemoglobin down from recent baseline around 12, stable at 10 overnight.  Still having red bloody bowel movements this morning, blood coating the stool. - Consult GI - Hold aspirin  and Plavix   History of transient ischemic attack (TIA) Cerebrovascular disease Peripheral vascular disease On Repatha  as an outpatient - Hold aspirin , Plavix  - Continue Zetia , metoprolol   COPD (chronic obstructive pulmonary disease) (HCC) No evidence of flare - Continue home DuoNeb  CKD (chronic kidney disease) stage 3, GFR 30-59 ml/min (HCC) CKD stage IIIa, creatinine stable relative to baseline 1.1  Essential hypertension, benign Blood pressure controlled - Continue home metoprolol  - Hold amlodipine , valsartan   Class I obesity BMI 30.2, complicates care        Subjective: Patient is feeling okay this morning, she is having some lower abdominal discomfort and bloating.  No fever, no respiratory distress.     Physical Exam: BP 131/67 (BP Location: Right Arm)   Pulse 69   Temp 97.9 F (36.6 C) (Oral)   Resp 16   Ht 5' 5 (1.651 m)   Wt 82.3 kg   SpO2 95%   BMI 30.20 kg/m   Adult female, lying in bed, interactive and appropriate RRR, no murmurs, no peripheral edema Respiratory rate normal, lungs clear, no rales or wheezes Abdomen soft, no bloating, no distention, no guarding.  Tenderness in the suprapubic area Attention normal, oriented x 3, face symmetric, speech fluent     Data Reviewed: Basic metabolic panel shows stable renal function, normal  electrolytes CBC shows stable hemoglobin    Family Communication:     Disposition: Status is: Inpatient         Author: Lonni SHAUNNA Dalton, MD 03/11/2024 4:11 PM  For on call review [email or web address].    "

## 2024-03-11 NOTE — Assessment & Plan Note (Signed)
 Hemoglobin down from recent baseline around 12, stable at 10 overnight.  Still having red bloody bowel movements this morning, blood coating the stool. - Consult GI - Hold aspirin  and Plavix 

## 2024-03-11 NOTE — Assessment & Plan Note (Signed)
 Blood pressure controlled - Continue home metoprolol  - Hold amlodipine , valsartan 

## 2024-03-11 NOTE — Progress Notes (Addendum)
 Brief Hospital Summary:  74 y/o female has a history of ulcers, hypertension, hyperlipidemia, diverticulitis, chronic kidney disease baseline creatinine 1.0-1.5, GERD, colonic polyps, and cystitis. She presents with acute onset bright red blood in stools and lower abdominal pain.   Subjective: Bubbling and bloated feeling in the lower abdomen, headache, nausea, heartburn, feels the urge to have a bowel movement  Objective: BP 131/67 (BP Location: Right Arm)   Pulse 69   Temp 97.9 F (36.6 C) (Oral)   Resp 16   Ht 5' 5 (1.651 m)   Wt 82.3 kg   SpO2 95%   BMI 30.20 kg/m    Obese adult female sitting in bed, alert and in no acute distress No scleral icterus, skin appears warm and dry RRR, no murmurs, no LE edema Lung sounds CTAB, moving air well Lower abdomen TTP Strength intact bilaterally in LE and UE  Assessment and Plan: GI bleed Anemia -consult gastroenterology -monitor stool color and consistency  -Trend hemoglobin  Hypertension  -Hold amlodipine  and valsartan  -follow up with primary care to discuss efficacy of blood pressure management  Stage 3a CKD -monitor Cr and eGFR -follow up with primary care annually   Dyslipidemia -continue zetia  and repatha      Marcelline Conger, PA-S Rozell Schmidt    Attending MD Note:  I have seen and examined the patient with PA student and agree with the note above which has been edited to reflect our agreed upon history, exam, and assessment/plan.    I have personally reviewed the orders for the patient, which were made under my direction.        Tiffany Talarico P Leonora Gores

## 2024-03-11 NOTE — Consult Note (Addendum)
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 hemoglobin noted.  Recommend IV PPI, transfuse as needed, monitoring of stools and blood counts, and clinically diet for now.  Plavix  on hold.  Would like to perform upper endoscopy and flexible sigmoidoscopy in the coming days.  Allow for a bit of Plavix  washout.  Will follow.  Norleen SAILOR. Abran Raddle., M.D. [facility] of Gastroenterology    "

## 2024-03-11 NOTE — Hospital Course (Signed)
 74 y.o. F with HTN, CKD IIIa baseline 1.1, hx CVA, PVD s/p R CEA and long-standing IDA due to GI blood loss, duodenitis, diverticular disease and known intestinal AVMs who presented with red bowel movements.

## 2024-03-11 NOTE — Assessment & Plan Note (Signed)
 Cerebrovascular disease Peripheral vascular disease On Repatha  as an outpatient - Hold aspirin , Plavix  - Continue Zetia , metoprolol 

## 2024-03-11 NOTE — Telephone Encounter (Signed)
I am aware.  Thank you.

## 2024-03-12 ENCOUNTER — Ambulatory Visit (HOSPITAL_COMMUNITY)

## 2024-03-12 ENCOUNTER — Ambulatory Visit

## 2024-03-12 ENCOUNTER — Ambulatory Visit: Admitting: Gastroenterology

## 2024-03-12 DIAGNOSIS — D62 Acute posthemorrhagic anemia: Secondary | ICD-10-CM | POA: Diagnosis not present

## 2024-03-12 DIAGNOSIS — K31819 Angiodysplasia of stomach and duodenum without bleeding: Secondary | ICD-10-CM

## 2024-03-12 DIAGNOSIS — N1831 Chronic kidney disease, stage 3a: Secondary | ICD-10-CM

## 2024-03-12 DIAGNOSIS — K648 Other hemorrhoids: Secondary | ICD-10-CM

## 2024-03-12 DIAGNOSIS — K625 Hemorrhage of anus and rectum: Secondary | ICD-10-CM | POA: Diagnosis not present

## 2024-03-12 DIAGNOSIS — K922 Gastrointestinal hemorrhage, unspecified: Secondary | ICD-10-CM | POA: Diagnosis not present

## 2024-03-12 DIAGNOSIS — Z7902 Long term (current) use of antithrombotics/antiplatelets: Secondary | ICD-10-CM | POA: Diagnosis not present

## 2024-03-12 DIAGNOSIS — K649 Unspecified hemorrhoids: Secondary | ICD-10-CM

## 2024-03-12 LAB — CBC
HCT: 29.6 % — ABNORMAL LOW (ref 36.0–46.0)
Hemoglobin: 9.3 g/dL — ABNORMAL LOW (ref 12.0–15.0)
MCH: 25.8 pg — ABNORMAL LOW (ref 26.0–34.0)
MCHC: 31.4 g/dL (ref 30.0–36.0)
MCV: 82.2 fL (ref 80.0–100.0)
Platelets: 268 K/uL (ref 150–400)
RBC: 3.6 MIL/uL — ABNORMAL LOW (ref 3.87–5.11)
RDW: 19.9 % — ABNORMAL HIGH (ref 11.5–15.5)
WBC: 3.8 K/uL — ABNORMAL LOW (ref 4.0–10.5)
nRBC: 0 % (ref 0.0–0.2)

## 2024-03-12 LAB — BASIC METABOLIC PANEL WITH GFR
Anion gap: 9 (ref 5–15)
BUN: 15 mg/dL (ref 8–23)
CO2: 23 mmol/L (ref 22–32)
Calcium: 9.3 mg/dL (ref 8.9–10.3)
Chloride: 108 mmol/L (ref 98–111)
Creatinine, Ser: 1.09 mg/dL — ABNORMAL HIGH (ref 0.44–1.00)
GFR, Estimated: 53 mL/min — ABNORMAL LOW
Glucose, Bld: 88 mg/dL (ref 70–99)
Potassium: 4 mmol/L (ref 3.5–5.1)
Sodium: 141 mmol/L (ref 135–145)

## 2024-03-12 MED ORDER — ONDANSETRON HCL 4 MG/2ML IJ SOLN
4.0000 mg | Freq: Four times a day (QID) | INTRAMUSCULAR | Status: DC | PRN
  Administered 2024-03-12: 4 mg via INTRAVENOUS
  Filled 2024-03-12: qty 2

## 2024-03-12 NOTE — Plan of Care (Signed)
?  Problem: Education: ?Goal: Knowledge of General Education information will improve ?Description: Including pain rating scale, medication(s)/side effects and non-pharmacologic comfort measures ?Outcome: Progressing ?  ?Problem: Health Behavior/Discharge Planning: ?Goal: Ability to manage health-related needs will improve ?Outcome: Progressing ?  ?Problem: Clinical Measurements: ?Goal: Will remain free from infection ?Outcome: Progressing ?Goal: Cardiovascular complication will be avoided ?Outcome: Progressing ?  ?Problem: Activity: ?Goal: Risk for activity intolerance will decrease ?Outcome: Progressing ?  ?

## 2024-03-12 NOTE — Progress Notes (Signed)
" °  Progress Note   Patient: Judy Carlson FMW:[id number] DOB: 1950/09/12 DOA: 03/10/2024     2 DOS: the patient was seen and examined on 03/12/2024 at 10:25 AM      Brief hospital course: 74 y.o. F with HTN, CKD IIIa baseline 1.1, hx CVA, PVD s/p R CEA and long-standing IDA due to GI blood loss, duodenitis, diverticular disease and known intestinal AVMs who presented with red bowel movements.     Assessment and Plan: GI bleed Acute on chronic blood loss anemia 1 bowel movement overnight, showed picture on her phone, looks like black clots.  Mixed with red blood.  Hemoglobin down slightly -Hold aspirin  and Plavix  - Trend CBC - Consult GI, appreciate GI expertise regarding utility/timing of endoscopic evaluation    Cerebrovascular disease Cerebrovascular disease Peripheral vascular disease -Hold aspirin  and clopidogrel  - Continue ezetimibe , metoprolol   COPD (chronic obstructive pulmonary disease) (HCC) Lungs clear - DuoNeb as needed   CKD (chronic kidney disease) stage 3, GFR 30-59 ml/min (HCC) CKD stage IIIa, creatinine stable relative to baseline 1.1  Essential hypertension, benign Blood pressure normal - Continue metoprolol  - Hold amlodipine  and valsartan           Subjective: Patient is doing okay, she has had some nausea this morning, she is had some epigastric discomfort this morning.  No fever, no confusion, no respiratory distress.  Another red bowel movement overnight.     Physical Exam: BP 124/63 (BP Location: Left Arm)   Pulse 73   Temp 98.2 F (36.8 C) (Oral)   Resp 17   Ht 5' 5 (1.651 m)   Wt 82.3 kg   SpO2 97%   BMI 30.20 kg/m   Adult female, sitting up in bed, interactive and appropriate RRR, no murmurs, no peripheral edema Respiratory rate normal, lungs clear without rales or wheezes Abdomen soft, she has mild tenderness in the left lower quadrant, otherwise no guarding or tenderness Attention normal, affect pleasant, judgment and  insight appear normal, oriented x 3, upper extremity strength normal     Data Reviewed: Discussed with GI team Basic metabolic panel shows normal electrolytes and renal function CBC shows slightly worse anemia     Family Communication:     Disposition: Status is: Inpatient         Author: Lonni SHAUNNA Dalton, MD 03/12/2024 12:26 PM  For on call review [email or web address]    "

## 2024-03-12 NOTE — Progress Notes (Addendum)
 "    [facility] Progress Note  CC:  Bright red blood per the rectum   Subjective: She denies having any nausea or vomiting.  No abdominal pain.  She passed multiple pieces of small dark formed stool which oozed red blood in the toilet water  at 1:30 AM without further recurrence.  No chest pain or shortness of breath.  Objective:  Vital signs in last 24 hours: Temp:  [97.9 F (36.6 C)-98.2 F (36.8 C)] 98.2 F (36.8 C) (01/07 2101) Pulse Rate:  [69-73] 73 (01/07 2101) Resp:  [16-17] 17 (01/07 2101) BP: (124-131)/(63-67) 124/63 (01/07 2101) SpO2:  [95 %-97 %] 97 % (01/07 2101) Last BM Date : 03/12/24 General: Alert 74 year old female no acute distress. Heart: Regular rate and rhythm, no murmurs. Pulm: Breath sounds clear throughout.  On room air. Abdomen:  Soft, nondistended.  Nontender.  Very mild tenderness to the RLQ without rebound or guarding.  No LLQ tenderness.  Positive bowel sounds to all 4 quadrants. Extremities: No lower extremity edema. Neurologic:  Alert and oriented x 4. Grossly normal neurologically. Psych:  Alert and cooperative. Normal mood and affect.  Intake/Output from previous day: 01/07 0701 - 01/08 0700 In: 240 [P.O.:240] Out: -  Intake/Output this shift: No intake/output data recorded.  Lab Results: Recent Labs    03/09/24 2203 03/10/24 0453 03/10/24 1946 03/11/24 0609 03/12/24 0728  WBC 5.0  --   --  4.9 3.8*  HGB 12.7   < > 11.0* 10.0* 9.3*  HCT 39.8   < > 33.2* 31.1* 29.6*  PLT 351  --   --  280 268   < > = values in this interval not displayed.   BMET Recent Labs    03/09/24 2203 03/11/24 0609 03/12/24 0728  NA 143 144 141  K 4.1 4.3 4.0  CL 107 111 108  CO2 24 22 23   GLUCOSE 102* 109* 88  BUN 20 18 15   CREATININE 1.10* 1.15* 1.09*  CALCIUM  9.5 9.3 9.3   LFT Recent Labs    03/11/24 0609  PROT 6.0*  ALBUMIN  3.6  AST 19  ALT 16  ALKPHOS 72  BILITOT 0.3   PT/INR Recent Labs    03/09/24 2203  03/10/24 1946  LABPROT 12.8 13.8  INR 0.9 1.0   Hepatitis Panel No results for input(s): HEPBSAG, HCVAB, HEPAIGM, HEPBIGM in the last 72 hours.  No results found.  Judy Carlson is a 74 y.o. 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 Pylori, GERD, hiatal hernia diverticulosis, colon polyps and GI bleed secondary to gastric and small bowel AVMs.     Assessment / Plan:  74 year old female with solid black stools on oral iron  with new onset bright red hematochezia with mild anemia, suspect possible diverticular bleed. FOBT positive. Hg 12.7 -> 12.3 -> 10.0 -> 9.3. Last dose of Plavix  was 8am on 1/5.  Hemodynamically stable. - Clear liquid diet - NPO after midnight  - EGD and flex sigmoidoscopy tomorrow benefits and risks discussed including risk with sedation, risk of bleeding, perforation and infection  - Stat CTA if patient demonstrates brisk GI bleeding -Patient has very mild RLQ tenderness on exam, no need for CTAP at this time but to consider if she develops active abdominal pain -  Transfuse for hemoglobin level less than 8 - Continue to hold Plavix  - Await further recommendations per Dr. Abran   Acute on chronic anemia, secondary to GI blood loss - See plan above    History of GI bleed with melena 12/2023, small bowel enteroscopy identified AVMs in the stomach and duodenum treated with APC. Actively bleeding lesion in the duodenal bulb was identified, treated with APC and clips placed. One quite friable AVM in the stomach treated, clips also applied given bleeding during endoscopic therapy.  Patient endorses passing solid black stools since starting oral iron   during her hospitalization 12/2023. - See plan above    History of GERD, gastric ulcers and H. pylori. - Continue Pantoprazole  40 mg IV twice daily   History of coronary artery disease.  No angina.    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   COPD    Active Problems:   Essential hypertension, benign   Acute blood loss anemia   CKD (chronic kidney disease) stage 3, GFR 30-59 ml/min (HCC)   COPD (chronic obstructive pulmonary disease) (HCC)   History of transient ischemic attack (TIA)   AVM (arteriovenous malformation)   Platelet inhibition due to Plavix    GI bleed   Obesity, Class I, BMI 30-34.9   Melena     LOS: 2 days   Judy Carlson  03/12/2024, 2:08 PM  GI ATTENDING  Interval history and data reviewed.  Patient seen and examined as outlined above.  Agree with interval progress note.  She is stable.  Drifting hemoglobin.  Still with moderate blood per rectum.  Inflamed hemorrhoids on exam.  History of upper GI AVMs.  Plavix  remains on hold.  Planning for upper endoscopy and flexible sigmoidoscopy tomorrow.  Norleen SAILOR. Abran Raddle., M.D. [facility] Division of Gastroenterology  "

## 2024-03-12 NOTE — Plan of Care (Signed)

## 2024-03-13 ENCOUNTER — Inpatient Hospital Stay (HOSPITAL_COMMUNITY): Admitting: Anesthesiology

## 2024-03-13 ENCOUNTER — Encounter (HOSPITAL_COMMUNITY): Admission: EM | Disposition: A | Payer: Self-pay | Source: Home / Self Care | Attending: Internal Medicine

## 2024-03-13 ENCOUNTER — Encounter (HOSPITAL_COMMUNITY): Payer: Self-pay | Admitting: Internal Medicine

## 2024-03-13 DIAGNOSIS — K31819 Angiodysplasia of stomach and duodenum without bleeding: Secondary | ICD-10-CM

## 2024-03-13 DIAGNOSIS — I10 Essential (primary) hypertension: Secondary | ICD-10-CM

## 2024-03-13 DIAGNOSIS — K922 Gastrointestinal hemorrhage, unspecified: Secondary | ICD-10-CM | POA: Diagnosis not present

## 2024-03-13 DIAGNOSIS — Z87891 Personal history of nicotine dependence: Secondary | ICD-10-CM | POA: Diagnosis not present

## 2024-03-13 DIAGNOSIS — K5730 Diverticulosis of large intestine without perforation or abscess without bleeding: Secondary | ICD-10-CM

## 2024-03-13 DIAGNOSIS — D62 Acute posthemorrhagic anemia: Secondary | ICD-10-CM | POA: Diagnosis not present

## 2024-03-13 DIAGNOSIS — K449 Diaphragmatic hernia without obstruction or gangrene: Secondary | ICD-10-CM

## 2024-03-13 DIAGNOSIS — N1831 Chronic kidney disease, stage 3a: Secondary | ICD-10-CM | POA: Diagnosis not present

## 2024-03-13 DIAGNOSIS — K921 Melena: Secondary | ICD-10-CM | POA: Diagnosis not present

## 2024-03-13 HISTORY — PX: HOT HEMOSTASIS: SHX5433

## 2024-03-13 HISTORY — PX: FLEXIBLE SIGMOIDOSCOPY: SHX5431

## 2024-03-13 HISTORY — PX: ESOPHAGOGASTRODUODENOSCOPY: SHX5428

## 2024-03-13 LAB — BASIC METABOLIC PANEL WITH GFR
Anion gap: 10 (ref 5–15)
BUN: 13 mg/dL (ref 8–23)
CO2: 23 mmol/L (ref 22–32)
Calcium: 9.5 mg/dL (ref 8.9–10.3)
Chloride: 110 mmol/L (ref 98–111)
Creatinine, Ser: 1.18 mg/dL — ABNORMAL HIGH (ref 0.44–1.00)
GFR, Estimated: 49 mL/min — ABNORMAL LOW
Glucose, Bld: 85 mg/dL (ref 70–99)
Potassium: 4.5 mmol/L (ref 3.5–5.1)
Sodium: 142 mmol/L (ref 135–145)

## 2024-03-13 LAB — CBC
HCT: 30.1 % — ABNORMAL LOW (ref 36.0–46.0)
Hemoglobin: 9.6 g/dL — ABNORMAL LOW (ref 12.0–15.0)
MCH: 26.4 pg (ref 26.0–34.0)
MCHC: 31.9 g/dL (ref 30.0–36.0)
MCV: 82.7 fL (ref 80.0–100.0)
Platelets: 298 K/uL (ref 150–400)
RBC: 3.64 MIL/uL — ABNORMAL LOW (ref 3.87–5.11)
RDW: 19.6 % — ABNORMAL HIGH (ref 11.5–15.5)
WBC: 3.8 K/uL — ABNORMAL LOW (ref 4.0–10.5)
nRBC: 0 % (ref 0.0–0.2)

## 2024-03-13 MED ORDER — LIDOCAINE 2% (20 MG/ML) 5 ML SYRINGE
INTRAMUSCULAR | Status: DC | PRN
  Administered 2024-03-13: 80 mg via INTRAVENOUS

## 2024-03-13 MED ORDER — PROPOFOL 1000 MG/100ML IV EMUL
INTRAVENOUS | Status: AC
  Filled 2024-03-13: qty 100

## 2024-03-13 MED ORDER — PROPOFOL 500 MG/50ML IV EMUL
INTRAVENOUS | Status: DC | PRN
  Administered 2024-03-13: 60 mg via INTRAVENOUS
  Administered 2024-03-13: 50 mg via INTRAVENOUS
  Administered 2024-03-13: 150 ug/kg/min via INTRAVENOUS

## 2024-03-13 MED ORDER — LACTATED RINGERS IV SOLN: INTRAVENOUS | Status: DC | PRN

## 2024-03-13 NOTE — Progress Notes (Addendum)
 PA-Student Progress Note   Brief Hospital Summary:  74 y/o female has a history of ulcers, duodenitis, diverticular disease, intestinal AVMs, and IDA due to GI blood loss, hypertension, hyperlipidemia, diverticulosis, chronic kidney disease baseline creatinine 1.0-1.5, GERD, colonic polyps, cerebrovascular disease, PVD on aspirin , Plavix , and cystitis. She presented with acute onset bright red blood in stools and lower abdominal pain. Currently awaiting EGD and flexible sigmoidoscopy with GI.    Subjective: Feeling much better this morning, no abdominal pain, no headache, no nausea, no bloody stools, passing gas   Objective: BP 121/61   Pulse 74   Temp 98.2 F (36.8 C) (Oral)   Resp 16   Ht 5' 5 (1.651 m)   Wt 82.3 kg   SpO2 96%   BMI 30.20 kg/m    Obese adult female sitting in bed, alert and in no acute distress No scleral icterus, skin appears warm and dry RRR, no murmurs, no LE edema Lung sounds CTAB, moving air well Abdomen not TTP, normoactive bowel sounds Ambulating independently    Assessment and Plan: GI bleed Anemia -consulted gastroenterology, EGD and flex sig scheduled -monitor stool color and consistency  -continue trending hemoglobin   Hypertension  -Hold amlodipine  and valsartan  -Continue metoprolol  in hospital -follow up with primary care to discuss efficacy of blood pressure management -trend blood pressure   Stage 3a CKD -trend Cr and eGFR -follow up with primary care annually    Dyslipidemia -continue zetia  and repatha  at home   Marcelline Greet PA-S [facility]         I have seen the patient and discussed with the PA student.  The above note has been edited to reflect our agreed on history and exam.  For details of my assessment and actual orders placed by me, the only accurate record is my separate documentation.    Lonni SQUIBB Nykira Reddix  5:55 PM 03/13/2024

## 2024-03-13 NOTE — Anesthesia Preprocedure Evaluation (Addendum)
 "                                  Anesthesia Evaluation  Patient identified by MRN, date of birth, ID band Patient awake    Reviewed: Allergy & Precautions, NPO status , Patient's Chart, lab work & pertinent test results, reviewed documented beta blocker date and time   History of Anesthesia Complications Negative for: history of anesthetic complications  Airway Mallampati: II  TM Distance: >3 FB Neck ROM: Full    Dental no notable dental hx.    Pulmonary COPD, former smoker   Pulmonary exam normal        Cardiovascular hypertension, Pt. on home beta blockers and Pt. on medications + Peripheral Vascular Disease  Normal cardiovascular exam     Neuro/Psych  PSYCHIATRIC DISORDERS Anxiety Depression     Neuromuscular disease CVA    GI/Hepatic Neg liver ROS, PUD,GERD  Medicated and Controlled,,  Endo/Other    Renal/GU CKD stage 3  Lab Results      Component                Value               Date                        K                        4.5                 03/13/2024                CREATININE               1.18 (H)            03/13/2024                    Musculoskeletal  (+) Arthritis ,    Abdominal   Peds  Hematology  (+) Blood dyscrasia, anemia On plavix   Lab Results      Component                Value               Date                      WBC                      3.8 (L)             03/13/2024                HGB                      9.6 (L)             03/13/2024                HCT                      30.1 (L)            03/13/2024                MCV  82.7                03/13/2024                PLT                      298                 03/13/2024              Anesthesia Other Findings All: see list  Reproductive/Obstetrics                              Anesthesia Physical Anesthesia Plan  ASA: 3  Anesthesia Plan: MAC   Post-op Pain Management: Minimal or no pain anticipated    Induction:   PONV Risk Score and Plan: Propofol  infusion and Treatment may vary due to age or medical condition  Airway Management Planned: Nasal Cannula and Natural Airway  Additional Equipment: None  Intra-op Plan:   Post-operative Plan:   Informed Consent: I have reviewed the patients History and Physical, chart, labs and discussed the procedure including the risks, benefits and alternatives for the proposed anesthesia with the patient or authorized representative who has indicated his/her understanding and acceptance.   Patient has DNR.  Discussed DNR with patient and Suspend DNR.     Plan Discussed with: CRNA  Anesthesia Plan Comments:          Anesthesia Quick Evaluation  "

## 2024-03-13 NOTE — Progress Notes (Signed)
" °  Progress Note   Patient: Judy Carlson FMW:[id number] DOB: 04-05-1950 DOA: 03/10/2024     3 DOS: the patient was seen and examined on 03/13/2024 at 9:53 AM      Brief hospital course: 74 y.o. F with HTN, CKD IIIa baseline 1.1, hx CVA, PVD s/p R CEA and long-standing IDA due to GI blood loss, duodenitis, diverticular disease and known intestinal AVMs who presented with red bowel movements.     Assessment and Plan: GI bleed, likely diverticular Acute blood loss anemia Patient admitted with red bloody bowel movements.  This tapered, but she was still passing clots yesterday.  Taken for EGD, flex sig today, EGD showed small AVMs in the duodenum which were treated with APC, many diverticuli and dried blood in the colon, suspect diverticular bleed. - Hold aspirin  and Plavix  - Observe overnight - Trend hemoglobin   History of transient ischemic attack (TIA) Cerebrovascular disease Peripheral vascular disease On Repatha  as an outpatient - Hold aspirin , Plavix  - Continue Zetia , metoprolol   COPD (chronic obstructive pulmonary disease) (HCC) No evidence of flare - Continue home DuoNeb  CKD (chronic kidney disease) stage 3, GFR 30-59 ml/min (HCC) CKD stage IIIa, creatinine stable relative to baseline 1.1  Essential hypertension, benign Blood pressure controlled - Continue home metoprolol  - Hold amlodipine , valsartan   Mood disorder - Continue escitalopram         Subjective: Doing well, passed gas overnight but no further stools.  Abdomen feels great.     Physical Exam: BP (!) 100/57   Pulse 65   Temp 97.6 F (36.4 C) (Temporal)   Resp 18   Ht 5' 5 (1.651 m)   Wt 82.3 kg   SpO2 96%   BMI 30.20 kg/m   Adult female, lying in bed, interactive and appropriate RRR, no murmurs, no peripheral edema Abdomen soft, no tenderness palpation or guarding, no ascites or distention Attention normal, affect appropriate, judgment and insight appear normal Face symmetric, speech  fluent    Data Reviewed: Discussed with GI Basic metabolic panel shows normal renal function, normal electrolytes CBC shows stable anemia EGD shows scattered AVMs, flex sig shows dried blood, no active bleeding    Family Communication:     Disposition: Status is: Inpatient         Author: Lonni SHAUNNA Dalton, MD 03/13/2024 4:28 PM  For on call review [email or web address]    "

## 2024-03-13 NOTE — Op Note (Signed)
 [facility] Patient Name: Judy Carlson Procedure Date: 03/13/2024 MRN: [id number] Attending MD: Norleen SAILOR. Abran , MD, [id number] Date of Birth: 03/04/1951 CSN: [id number] Age: 74 Admit Type: Inpatient Procedure:                Flexible Sigmoidoscopy Indications:              Hematochezia Providers:                Norleen SAILOR. Abran, MD, Ozell Pouch, Curtistine Bishop, Technician Referring MD:              Medicines:                Monitored Anesthesia Care Complications:            No immediate complications. Estimated Blood Loss:     Estimated blood loss: none. Procedure:                Pre-Anesthesia Assessment:                           - Prior to the procedure, a History and Physical                            was performed, and patient medications and                            allergies were reviewed. The patient's tolerance of                            previous anesthesia was also reviewed. The risks                            and benefits of the procedure and the sedation                            options and risks were discussed with the patient.                            All questions were answered, and informed consent                            was obtained. Prior Anticoagulants: The patient has                            taken Plavix  (clopidogrel ), last dose was 4 days                            prior to procedure. ASA Grade Assessment: III - A                            patient with severe systemic disease. After  reviewing the risks and benefits, the patient was                            deemed in satisfactory condition to undergo the                            procedure.                           After obtaining informed consent, the scope was                            passed under direct vision. The [id number]-[id number] ([id number])                            Olympus endoscope was introduced through the anus                             and advanced to the the sigmoid colon. The flexible                            sigmoidoscopy was accomplished without difficulty.                            The patient tolerated the procedure well. The                            quality of the bowel preparation was adequate. Scope In: 3:56:59 PM Scope Out: 4:01:08 PM Total Procedure Duration: 0 hours 4 minutes 9 seconds  Findings:      There was old blood in the colon, but no active bleeding      - Significant diverticulosis in the sigmoid colon.      - Exam was otherwise normal including retroflexed view of the rectum. No       hemorrhoids Impression:               - There was old blood in the colon, but no active                            bleeding                           - Significant diverticulosis in the sigmoid colon.                           - Exam was otherwise normal including retroflexed                            view of the rectum. No hemorrhoids                           - The patient is felt to have had a diverticular                            bleed, which has stopped. Moderate  Sedation:      none Recommendation:           - Resume previous diet.                           - Continue to monitor stools and blood counts                           - Transfuse as needed for hemoglobin below 7                           - Continue to hold Plavix  for now                           - If acute recurrent bleeding occurs, recommend CTA                           Findings reviewed with patient. She was provided                            copies of her examinations. Procedure Code(s):        --- Professional ---                           [phone], Sigmoidoscopy, flexible; diagnostic,                            including collection of specimen(s) by brushing or                            washing, when performed (separate procedure) Diagnosis Code(s):        --- Professional ---                           K92.1,  Melena (includes Hematochezia)                           K57.30, Diverticulosis of large intestine without                            perforation or abscess without bleeding CPT copyright 2022 [employer]. All rights reserved. The codes documented in this report are preliminary and upon coder review may  be revised to meet current compliance requirements. Norleen SAILOR. Abran, MD 03/13/2024 4:22:12 PM This report has been signed electronically. Number of Addenda: 0

## 2024-03-13 NOTE — Progress Notes (Addendum)
 "    [facility] Progress Note  CC:  Bright red blood per the rectum   Subjective: She remains NPO since midnight for EGD and flex sig later this afternoon.  No nausea or vomiting.  No abdominal pain.  No BM, melena or bright red blood per the rectum overnight or thus far this morning.  No chest pain or shortness of breath.   Objective:  Vital signs in last 24 hours: Temp:  [98 F (36.7 C)-98.6 F (37 C)] 98.2 F (36.8 C) (01/09 0353) Pulse Rate:  [65-76] 74 (01/09 0353) Resp:  [16] 16 (01/08 1416) BP: (114-129)/(61-83) 121/61 (01/09 0353) SpO2:  [96 %-100 %] 96 % (01/09 0353) Last BM Date : 03/12/24 General: Alert 74 year old female in no acute distress. Heart: Regular rate and rhythm, no murmurs. Pulm: Breath sounds clear throughout.  On room air. Abdomen: Soft, nondistended.  Nontender.  Positive bowel sounds to all 4 quadrants. Extremities: No lower extremity edema. Neurologic:  Alert and oriented x 4. Grossly normal neurologically. Psych:  Alert and cooperative. Normal mood and affect.  Intake/Output from previous day: No intake/output data recorded. Intake/Output this shift: No intake/output data recorded.  Lab Results: Recent Labs    03/11/24 0609 03/12/24 0728 03/13/24 0719  WBC 4.9 3.8* 3.8*  HGB 10.0* 9.3* 9.6*  HCT 31.1* 29.6* 30.1*  PLT 280 268 298   BMET Recent Labs    03/11/24 0609 03/12/24 0728 03/13/24 0719  NA 144 141 142  K 4.3 4.0 4.5  CL 111 108 110  CO2 22 23 23   GLUCOSE 109* 88 85  BUN 18 15 13   CREATININE 1.15* 1.09* 1.18*  CALCIUM  9.3 9.3 9.5   LFT Recent Labs    03/11/24 0609  PROT 6.0*  ALBUMIN  3.6  AST 19  ALT 16  ALKPHOS 72  BILITOT 0.3   PT/INR Recent Labs    03/10/24 1946  LABPROT 13.8  INR 1.0   Hepatitis Panel No results for input(s): HEPBSAG, HCVAB, HEPAIGM, HEPBIGM in the last 72 hours.  No results found.   Judy Carlson is a 74 y.o. 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 Pylori, GERD, hiatal hernia diverticulosis, colon polyps and GI bleed secondary to gastric and small bowel AVMs.   Assessment / Plan:  74 year old female with solid black stools on oral iron  with new onset bright red hematochezia with mild anemia, suspect possible diverticular bleed. FOBT positive. Hg 12.7 -> 12.3 -> 10.0 -> 9.3 -> today Hg 9.6. Last dose of Plavix  was 8am on 1/5.  No BM, melena or bright red blood per the rectum overnight or thus far this morning.  Hemodynamically stable. - NPO  -  Patient to proceed with EGD and flex sigmoidoscopy this afternoon - Water  enema x 1 at 12pm in prep for flex sig, discussed with Amy RN - Stat CTA if patient demonstrates brisk GI bleeding - Transfuse for hemoglobin level less than 8 - Continue to hold Plavix  - IV fluids per the hospitalist    Acute on chronic anemia, secondary to GI blood loss - See plan above    History of GI bleed with melena 12/2023, small bowel enteroscopy identified AVMs in the stomach and duodenum treated with APC. Actively bleeding lesion  in the duodenal bulb was identified, treated with APC and clips placed. One quite friable AVM in the stomach treated, clips also applied given bleeding during endoscopic therapy.  Patient endorses passing solid black stools since starting oral iron  during her hospitalization 12/2023. - See plan above    History of GERD, gastric ulcers and H. pylori. - Continue Pantoprazole  40 mg IV twice daily   History of coronary artery disease.  No angina.    History of CVA, carotid artery disease s/p right carotid endarterectomy 01/2019, right ICA thrombus s/p right transcarotid  artery revascularization 07/2023 on Plavix  and ASA last taken at 8am on 1/5.   COPD  CKD stage III. Cr 1.18.   Active Problems:   Essential hypertension, benign   Acute blood loss anemia   CKD (chronic kidney disease) stage 3, GFR 30-59 ml/min (HCC)   COPD (chronic obstructive pulmonary disease) (HCC)   History of transient ischemic attack (TIA)   AVM (arteriovenous malformation)   Platelet inhibition due to Plavix    GI bleed   Obesity, Class I, BMI 30-34.9   Melena   Bleeding hemorrhoids   Acute on chronic blood loss anemia     LOS: 3 days   Judy Carlson  03/13/2024, 9:51 AM  GI ATTENDING  Interval history and data reviewed.  Patient seen and examined as documented above.  Agree with interval progress note.  No further bleeding.  Hemoglobin stable.  For upper endoscopy and flexible sigmoidoscopy late this afternoon.  Last dose of Plavix  4 days ago.  Judy Carlson. Abran Raddle., M.D. [facility] Division of Gastroenterology  "

## 2024-03-13 NOTE — Plan of Care (Signed)
   Problem: Activity: Goal: Risk for activity intolerance will decrease Outcome: Progressing   Problem: Safety: Goal: Ability to remain free from injury will improve Outcome: Progressing   Problem: Skin Integrity: Goal: Risk for impaired skin integrity will decrease Outcome: Progressing

## 2024-03-13 NOTE — Op Note (Signed)
 [facility] Patient Name: Judy Carlson Procedure Date: 03/13/2024 MRN: [id number] Attending MD: Norleen SAILOR. Abran , MD, [id number] Date of Birth: 08/10/1950 CSN: [id number] Age: 74 Admit Type: Inpatient Procedure:                Upper GI endoscopy with control of bleeding (APC) Indications:              Acute post hemorrhagic anemia, Hematochezia, Melena Providers:                Norleen SAILOR. Abran, MD, Ozell Pouch, Curtistine Bishop, Technician Referring MD:             [facility] Medicines:                Monitored Anesthesia Care Complications:            No immediate complications. Estimated Blood Loss:     Estimated blood loss: none. Procedure:                Pre-Anesthesia Assessment:                           - Prior to the procedure, a History and Physical                            was performed, and patient medications and                            allergies were reviewed. The patient's tolerance of                            previous anesthesia was also reviewed. The risks                            and benefits of the procedure and the sedation                            options and risks were discussed with the patient.                            All questions were answered, and informed consent                            was obtained. Prior Anticoagulants: The patient has                            taken Plavix  (clopidogrel ), last dose was 4 days                            prior to procedure. ASA Grade Assessment: III - A                            patient with severe systemic disease. After  reviewing the risks and benefits, the patient was                            deemed in satisfactory condition to undergo the                            procedure.                           After obtaining informed consent, the endoscope was                            passed under direct vision. Throughout the                             procedure, the patient's blood pressure, pulse, and                            oxygen saturations were monitored continuously. The                            [id number]-H190 ([id number]) Olympus endoscope was introduced                            through the mouth, and advanced to the third part                            of duodenum. The upper GI endoscopy was                            accomplished without difficulty. The patient                            tolerated the procedure well. Scope In: Scope Out: Findings:      The esophagus was normal.      The stomach was normal. Small hiatal hernia.      A single 1 mm angiodysplastic lesion without bleeding was found in the       duodenal bulb and a single small AVM was found in the second portion.       Coagulation for bleeding prevention using argon plasma was successful      The examined duodenum was otherwise normal to the third portion. .      The cardia and gastric fundus were normal on retroflexion. Impression:               - Normal esophagus.                           - Normal stomach. Hiatal hernia.                           - Nonbleeding angiodysplastic lesions in the                            duodenum treated with APC.                           -  Otherwise normal examination. No bleeding or                            evidence of recent bleed. Moderate Sedation:      none Recommendation:           1. Proceed to flexible sigmoidoscopy. Procedure Code(s):        --- Professional ---                           [phone], Esophagogastroduodenoscopy, flexible,                            transoral; with control of bleeding, any method Diagnosis Code(s):        --- Professional ---                           K31.819, Angiodysplasia of stomach and duodenum                            without bleeding                           D62, Acute posthemorrhagic anemia                           K92.1, Melena (includes  Hematochezia) CPT copyright 2022 American [employer] Association. All rights reserved. The codes documented in this report are preliminary and upon coder review may  be revised to meet current compliance requirements. Norleen SAILOR. Abran, MD 03/13/2024 4:14:21 PM This report has been signed electronically. Number of Addenda: 0

## 2024-03-13 NOTE — Plan of Care (Signed)
  Problem: Clinical Measurements: Goal: Ability to maintain clinical measurements within normal limits will improve Outcome: Progressing Goal: Will remain free from infection Outcome: Progressing Goal: Diagnostic test results will improve Outcome: Progressing   Problem: Elimination: Goal: Will not experience complications related to bowel motility Outcome: Progressing Goal: Will not experience complications related to urinary retention Outcome: Progressing   Problem: Pain Managment: Goal: General experience of comfort will improve and/or be controlled Outcome: Progressing   Problem: Safety: Goal: Ability to remain free from injury will improve Outcome: Progressing

## 2024-03-13 NOTE — Anesthesia Postprocedure Evaluation (Signed)
"   Anesthesia Post Note  Patient: Judy Carlson  Procedure(s) Performed: EGD (ESOPHAGOGASTRODUODENOSCOPY) SIGMOIDOSCOPY, FLEXIBLE EGD, WITH ARGON PLASMA COAGULATION     Patient location during evaluation: PACU Anesthesia Type: MAC Level of consciousness: awake and alert Pain management: pain level controlled Vital Signs Assessment: post-procedure vital signs reviewed and stable Respiratory status: spontaneous breathing, nonlabored ventilation and respiratory function stable Cardiovascular status: blood pressure returned to baseline Postop Assessment: no apparent nausea or vomiting Anesthetic complications: no   No notable events documented.  Last Vitals:  Vitals:   03/13/24 1630 03/13/24 1714  BP: 126/73 121/79  Pulse: 65 72  Resp: 16 16  Temp:  36.6 C  SpO2: 98% 99%    Last Pain:  Vitals:   03/13/24 1714  TempSrc: Oral  PainSc:    Pain Goal:                   [name]      "

## 2024-03-13 NOTE — Transfer of Care (Signed)
 Immediate Anesthesia Transfer of Care Note  Patient: Judy Carlson  Procedure(s) Performed: EGD (ESOPHAGOGASTRODUODENOSCOPY) SIGMOIDOSCOPY, FLEXIBLE EGD, WITH ARGON PLASMA COAGULATION  Patient Location: PACU  Anesthesia Type:MAC  Level of Consciousness: awake  Airway & Oxygen Therapy: Patient Spontanous Breathing  Post-op Assessment: Report given to RN and Post -op Vital signs reviewed and stable  Post vital signs: Reviewed and stable  Last Vitals:  Vitals Value Taken Time  BP    Temp    Pulse 78 03/13/24 16:07  Resp 24 03/13/24 16:07  SpO2 94 % 03/13/24 16:07  Vitals shown include unfiled device data.  Last Pain:  Vitals:   03/13/24 1405  TempSrc: Temporal  PainSc: 0-No pain         Complications: No notable events documented.

## 2024-03-14 DIAGNOSIS — I10 Essential (primary) hypertension: Secondary | ICD-10-CM | POA: Diagnosis not present

## 2024-03-14 DIAGNOSIS — Q2730 Arteriovenous malformation, site unspecified: Secondary | ICD-10-CM | POA: Diagnosis not present

## 2024-03-14 DIAGNOSIS — D62 Acute posthemorrhagic anemia: Secondary | ICD-10-CM | POA: Diagnosis not present

## 2024-03-14 LAB — BASIC METABOLIC PANEL WITH GFR
Anion gap: 10 (ref 5–15)
BUN: 15 mg/dL (ref 8–23)
CO2: 22 mmol/L (ref 22–32)
Calcium: 9.3 mg/dL (ref 8.9–10.3)
Chloride: 108 mmol/L (ref 98–111)
Creatinine, Ser: 1.45 mg/dL — ABNORMAL HIGH (ref 0.44–1.00)
GFR, Estimated: 38 mL/min — ABNORMAL LOW
Glucose, Bld: 97 mg/dL (ref 70–99)
Potassium: 4.3 mmol/L (ref 3.5–5.1)
Sodium: 140 mmol/L (ref 135–145)

## 2024-03-14 LAB — CBC
HCT: 28.7 % — ABNORMAL LOW (ref 36.0–46.0)
Hemoglobin: 9.3 g/dL — ABNORMAL LOW (ref 12.0–15.0)
MCH: 26.6 pg (ref 26.0–34.0)
MCHC: 32.4 g/dL (ref 30.0–36.0)
MCV: 82 fL (ref 80.0–100.0)
Platelets: 298 K/uL (ref 150–400)
RBC: 3.5 MIL/uL — ABNORMAL LOW (ref 3.87–5.11)
RDW: 19.4 % — ABNORMAL HIGH (ref 11.5–15.5)
WBC: 4 K/uL (ref 4.0–10.5)
nRBC: 0 % (ref 0.0–0.2)

## 2024-03-14 MED ORDER — PANTOPRAZOLE SODIUM 40 MG PO TBEC: 40.0000 mg | DELAYED_RELEASE_TABLET | Freq: Every day | ORAL | Status: AC

## 2024-03-14 NOTE — Discharge Summary (Signed)
 " Physician Discharge Summary   Patient: Judy Carlson MRN: [id number] DOB: 1950-11-18  Admit date:     03/10/2024  Discharge date: 03/14/2024  Discharge Physician: Lonni SHAUNNA Dalton   PCP: Garald Karlynn GAILS, MD     Recommendations at discharge:  Follow up with PCP Dr. Garald in 1 week Dr. Garald: Please obtain BMP and CBC this Thursday (discharge Cr 1.45, K 4.3, Hgb 9.3) Resume Plavix  and aspirin  in 3 days and/or when bleeding has stably stopped Please resume amlodipine  valsartan  as approrpiate   Follow up with [facility] as informed     Discharge Diagnoses: Principal discharge diagnosis   Acute diverticular bleed Active Problems:   Acute blood loss anemia   Essential hypertension, benign   CKD (chronic kidney disease) stage 3a, GFR 30-59 ml/min (HCC)   COPD (chronic obstructive pulmonary disease) (HCC)   History of transient ischemic attack (TIA)   AVM (arteriovenous malformation)   Obesity, Class I, BMI 30-34.9   AVM (arteriovenous malformation) of duodenum, acquired      Hospital Course: 74 y.o. F with HTN, CKD IIIa baseline 1.1, hx CVA, PVD s/p R CEA and long-standing IDA due to GI blood loss, duodenitis, diverticular disease and known intestinal AVMs who presented with red bowel movements.    GI bleed, likely diverticular Acute blood loss anemia Patient admitted with red bloody bowel movements.  This tapered, and she went for EGD, flex sig 1/9 with Dr. Abran.   EGD showed small AVMs in the duodenum which were treated with APC; flex sig showed many diverticuli and dried blood in the colon, suspect diverticular bleed.  Monitored overnight, bleeding has clinically stopped.  Recommend hold aspirin /Plavix  3 more days and until spotting stops.    Follow up with PCP for labs this Thursday      Essential hypertension, benign BP relatively low in setting of bleed. Metoprolol  continued, amlodipine  valsartan  held until PCP follow up           The  [place]  Controlled Substances Registry was reviewed for this patient prior to discharge.  Consultants: GI Dr. Abran Procedures performed: EGD Flex sig   Disposition: Home Diet recommendation:  Cardiac diet  DISCHARGE MEDICATION: Allergies as of 03/14/2024       Reactions   Bactrim  [sulfamethoxazole -trimethoprim ] Itching   Crestor  [rosuvastatin  Calcium ] Other (See Comments)   myalgias   Hydrocodone  Itching   Lipitor [atorvastatin ] Other (See Comments)   Myalgias/feet pain   Lisinopril Swelling, Other (See Comments)   Tongue swelling; facial swelling looks like she was hit in the face.   Naproxen Other (See Comments)   Advised not to take due to BP meds   Nsaids Other (See Comments)   Pt advised not to take due to blood pressure medications    Pravastatin  Other (See Comments)   Myalgias.   Prednisone  Itching, Swelling, Other (See Comments)   Toe swelling    Amoxicillin  Anxiety, Cough   Clindamycin /lincomycin Rash   Doxycycline Other (See Comments)   dizziness   Lovastatin  Hives, Rash, Other (See Comments)   Pt reports issues with her veins.        Medication List     PAUSE taking these medications    amLODipine -valsartan  5-160 MG tablet Wait to take this until your doctor or other care provider tells you to start again. Commonly known as: EXFORGE  Take 1 tablet by mouth daily.   aspirin  EC 81 MG tablet Wait to take this until your doctor or other care provider  tells you to start again. Take 1 tablet (81 mg total) by mouth daily at 6 (six) AM. Please resume on 12/16/2023   clopidogrel  75 MG tablet Wait to take this until your doctor or other care provider tells you to start again. Commonly known as: PLAVIX  Take 1 tablet (75 mg total) by mouth daily. Please resume on 12/14/2023       TAKE these medications    acetaminophen  500 MG tablet Commonly known as: TYLENOL  Take 1,000 mg by mouth as needed for mild pain (pain score 1-3), headache or fever.    colchicine  0.6 MG tablet Take two tablets prn gout attack.Then take another one in 1-2 hrs. Do not repeat for 3 days.   escitalopram  10 MG tablet Commonly known as: Lexapro  Take 1 tablet (10 mg total) by mouth daily.   ezetimibe  10 MG tablet Commonly known as: ZETIA  Take 1 tablet (10 mg total) by mouth daily.   Ferrex 150 150 MG capsule Generic drug: iron  polysaccharides Take 150 mg by mouth daily.   furosemide  20 MG tablet Commonly known as: LASIX  Take 1 tablet (20 mg total) by mouth as needed for edema.   metoprolol  succinate 100 MG 24 hr tablet Commonly known as: TOPROL -XL TAKE 1 TABLET DAILY WITH OR IMMEDIATELY FOLLOWING A MEAL.   multivitamin tablet Take 1 tablet by mouth daily.   pantoprazole  40 MG tablet Commonly known as: PROTONIX  Take 1 tablet (40 mg total) by mouth daily. Take 40 mg twice a day for 2 weeks and then once a day What changed:  how much to take how to take this when to take this   PATADAY  OP Place 1 drop into both eyes daily as needed (For dry eyes).   PRESCRIPTION MEDICATION Take 0.5 tablets by mouth as needed. Pt starts to itch when taking a whole tablet, she has been taking a half if pain is greater than tylenol  *Hydrocodone /acetaminaphen*   Repatha  SureClick 140 MG/ML Soaj Generic drug: Evolocumab  Inject 140 mg into the skin every 14 (fourteen) days.   triamcinolone  ointment 0.5 % Commonly known as: KENALOG  Apply 1 Application topically 4 (four) times daily.   Vitamin D3 50 MCG (2000 UT) capsule TAKE 1 CAPSULE (2,000 UNITS TOTAL) BY MOUTH DAILY.        Follow-up Information     Plotnikov, Karlynn GAILS, MD. Schedule an appointment as soon as possible for a visit in 1 week(s).   Specialty: Internal Medicine Contact information: [address] [place] [address] [phone]                 Discharge Instructions     Discharge instructions   Complete by: As directed    **IMPORTANT DISCHARGE  INSTRUCTIONS**   From Dr. Jonel: You were admitted for intestinal bleeding.  Thankfully, here, your blood level has remained stable in the ~10 g/dL range  You had an upper endoscopy and a flexible sigmoidoscopy that showed the bleeding had stopped.  In the upper part, we saw two small AVMs (arteriovenous malformations) and Dr. Abran coagulated them with argon plasma.   In the colon/lower part, we saw lots of diverticuli and some dried old residual blood, but nothing new.  This makes us  think it was very likely a diverticular bleed that has stopped  HOLD (do not take) aspirin  and Plavix  for 3 more days. If you have had 2 brown bowel movements, you may resume them after 3 days  If you continue to have spotting blood in the  bowel movements, hold the aspirin  and Plavix  until you see Dr. Garald  Ask Dr. Garald to check your blood counts and creatinine (kidney function) on Thursday  Go see him for an appointment in 1 week  Until you see him, HOLD (do not take) your amlodipine -valsartan  (Your blood pressure here was on the low side, and I don't want to drive it too low) He can resume that medicine as appropriate  Continue your other medicines without change   Increase activity slowly   Complete by: As directed        Discharge Exam: Filed Weights   03/10/24 1649  Weight: 82.3 kg    General: Pt is alert, awake, not in acute distress Cardiovascular: RRR, nl S1-S2, no murmurs appreciated.   No LE edema.   Respiratory: Normal respiratory rate and rhythm.  CTAB without rales or wheezes. Abdominal: Abdomen soft and non-tender.  No distension or HSM.   Neuro/Psych: Strength symmetric in upper and lower extremities.  Judgment and insight appear normal.   Condition at discharge: good  The results of significant diagnostics from this hospitalization (including imaging, microbiology, ancillary and laboratory) are listed below for reference.   Imaging Studies: MM 3D SCREENING  MAMMOGRAM BILATERAL BREAST Result Date: 02/25/2024 CLINICAL DATA:  Screening. EXAM: DIGITAL SCREENING BILATERAL MAMMOGRAM WITH TOMOSYNTHESIS AND CAD TECHNIQUE: Bilateral screening digital craniocaudal and mediolateral oblique mammograms were obtained. Bilateral screening digital breast tomosynthesis was performed. The images were evaluated with computer-aided detection. COMPARISON:  Previous exam(s). ACR Breast Density Category b: There are scattered areas of fibroglandular density. FINDINGS: There are no findings suspicious for malignancy. IMPRESSION: No mammographic evidence of malignancy. A result letter of this screening mammogram will be mailed directly to the patient. RECOMMENDATION: Screening mammogram in one year. (Code:SM-B-01Y) BI-RADS CATEGORY  1: Negative. Electronically Signed   By: Dina  Arceo M.D.   On: 02/25/2024 08:46    Microbiology: Results for orders placed or performed in visit on 02/24/24  CULTURE, URINE COMPREHENSIVE     Status: None   Collection Time: 02/24/24  9:30 AM   Specimen: Blood  Result Value Ref Range Status   Source: NOT GIVEN  Final   Status: FINAL  Final   RESULT: No Growth  Final    Labs: CBC: Recent Labs  Lab 03/09/24 2203 03/10/24 0453 03/10/24 1946 03/11/24 0609 03/12/24 0728 03/13/24 0719 03/14/24 0632  WBC 5.0  --   --  4.9 3.8* 3.8* 4.0  HGB 12.7   < > 11.0* 10.0* 9.3* 9.6* 9.3*  HCT 39.8   < > 33.2* 31.1* 29.6* 30.1* 28.7*  MCV 81.9  --   --  81.6 82.2 82.7 82.0  PLT 351  --   --  280 268 298 298   < > = values in this interval not displayed.   Basic Metabolic Panel: Recent Labs  Lab 03/09/24 2203 03/11/24 0609 03/12/24 0728 03/13/24 0719 03/14/24 0632  NA 143 144 141 142 140  K 4.1 4.3 4.0 4.5 4.3  CL 107 111 108 110 108  CO2 24 22 23 23 22   GLUCOSE 102* 109* 88 85 97  BUN 20 18 15 13 15   CREATININE 1.10* 1.15* 1.09* 1.18* 1.45*  CALCIUM  9.5 9.3 9.3 9.5 9.3   Liver Function Tests: Recent Labs  Lab 03/09/24 2203  03/11/24 0609  AST 22 19  ALT 25 16  ALKPHOS 92 72  BILITOT 0.2 0.3  PROT 7.1 6.0*  ALBUMIN  4.3 3.6   CBG: No results for  input(s): GLUCAP in the last 168 hours.  Discharge time spent: approximately 25 minutes spent on discharge counseling, evaluation of patient on day of discharge, and coordination of discharge planning with nursing, social work, pharmacy and case management  Signed: Lonni SHAUNNA Dalton, MD [facility] 03/14/2024         "

## 2024-03-15 ENCOUNTER — Encounter (HOSPITAL_COMMUNITY): Payer: Self-pay | Admitting: Internal Medicine

## 2024-03-16 ENCOUNTER — Telehealth: Payer: Self-pay | Admitting: *Deleted

## 2024-03-16 ENCOUNTER — Telehealth: Payer: Self-pay

## 2024-03-16 DIAGNOSIS — K31819 Angiodysplasia of stomach and duodenum without bleeding: Secondary | ICD-10-CM

## 2024-03-16 NOTE — Telephone Encounter (Signed)
 Spoke with the pt and was able to inform her that her labs have been ordered and she can come in to have them done when she is free before her apptmnt.

## 2024-03-16 NOTE — Transitions of Care (Post Inpatient/ED Visit) (Signed)
 "  03/16/2024  Name: Judy Carlson MRN: [id number] DOB: 1950-05-28  Today's TOC FU Call Status: Today's TOC FU Call Status:: Successful TOC FU Call Completed TOC FU Call Complete Date: 03/16/24  Patient's Name and Date of Birth confirmed. Name, DOB  Transition Care Management Follow-up Telephone Call Date of Discharge: 03/14/24 Discharge Facility: [facility]) Type of Discharge: Inpatient Admission Primary Inpatient Discharge Diagnosis:: Gastrointestinal hemorrhage How have you been since you were released from the hospital?: Better Any questions or concerns?: Yes Patient Questions/Concerns:: Patient needs blood workdone before resuming blood thinners Patient Questions/Concerns Addressed: Other: (Patient is going to call th Dr and make them aware and set date and time)  Items Reviewed: Did you receive and understand the discharge instructions provided?: Yes Medications obtained,verified, and reconciled?: Yes (Medications Reviewed) Any new allergies since your discharge?: No Dietary orders reviewed?: No Do you have support at home?: Yes People in Home [RPT]: alone Name of Support/Comfort Primary Source: Nieces  Medications Reviewed Today: Medications Reviewed Today     Reviewed by Kennieth Cathlean DEL, RN (Case Manager) on 03/16/24 at 1054  Med List Status: <None>   Medication Order Taking? Sig Documenting Provider Last Dose Status Informant  acetaminophen  (TYLENOL ) 500 MG tablet [id number] Yes Take 1,000 mg by mouth as needed for mild pain (pain score 1-3), headache or fever. [provider]  Active Self, Pharmacy Records  amLODipine -valsartan  (EXFORGE ) 5-160 MG tablet [id number]  Take 1 tablet by mouth daily. Vannie Reche GORMAN, NP  Active Self  aspirin  EC 81 MG tablet [id number]  Take 1 tablet (81 mg total) by mouth daily at 6 (six) AM. Please resume on 12/16/2023 Verdene Purchase, MD  Active Self  Cholecalciferol  (VITAMIN D3) 50 MCG (2000 UT) capsule [id number] Yes  TAKE 1 CAPSULE (2,000 UNITS TOTAL) BY MOUTH DAILY. Plotnikov, Aleksei V, MD  Active Self, Pharmacy Records  clopidogrel  (PLAVIX ) 75 MG tablet [id number]  Take 1 tablet (75 mg total) by mouth daily. Please resume on 12/14/2023 Krishnan, Gokul, MD  Active Self  colchicine  0.6 MG tablet [id number] Yes Take two tablets prn gout attack.Then take another one in 1-2 hrs. Do not repeat for 3 days. Plotnikov, Aleksei V, MD  Active Self  escitalopram  (LEXAPRO ) 10 MG tablet [id number] Yes Take 1 tablet (10 mg total) by mouth daily. Plotnikov, Aleksei V, MD  Active Self  Evolocumab  (REPATHA  SURECLICK) 140 MG/ML SOAJ [id number] Yes Inject 140 mg into the skin every 14 (fourteen) days. Mona Vinie BROCKS, MD  Active Self, Pharmacy Records           Med Note [name], [name]   Wed Mar 04, 2024 10:58 AM)    ezetimibe  (ZETIA ) 10 MG tablet [id number] Yes Take 1 tablet (10 mg total) by mouth daily. Plotnikov, Karlynn GAILS, MD  Active Self, Pharmacy Records  FERREX 150 150 MG capsule [id number] Yes Take 150 mg by mouth daily. [provider]  Active Self           Med Note [name], [name]   Tue Mar 10, 2024  3:28 PM) Pt stopped taking when she saw rectal bleeding  furosemide  (LASIX ) 20 MG tablet [id number] Yes Take 1 tablet (20 mg total) by mouth as needed for edema. Vannie Reche GORMAN, NP  Active Self  metoprolol  succinate (TOPROL -XL) 100 MG 24 hr tablet [id number] Yes TAKE 1 TABLET DAILY WITH OR IMMEDIATELY FOLLOWING A MEAL. Plotnikov, Aleksei V, MD  Active Self, Pharmacy Records  Multiple Vitamin (MULTIVITAMIN) tablet [id number] Yes Take  1 tablet by mouth daily. [provider]  Active Self, Pharmacy Records  Olopatadine  HCl (PATADAY  OP) [id number] Yes Place 1 drop into both eyes daily as needed (For dry eyes). [provider]  Active Self, Pharmacy Records           Med Note EFRAIM, ALFREIDA CROME   Thu Jul 25, 2023  8:23 PM)    pantoprazole  (PROTONIX ) 40 MG tablet [id number] Yes Take 1 tablet (40  mg total) by mouth daily. Take 40 mg twice a day for 2 weeks and then once a day Jonel Lonni SQUIBB, MD  Active   PRESCRIPTION MEDICATION [id number] Yes Take 0.5 tablets by mouth as needed. Pt starts to itch when taking a whole tablet, she has been taking a half if pain is greater than tylenol  *Hydrocodone jarrell* [provider]  Active Self  triamcinolone  ointment (KENALOG ) 0.5 % [id number] Yes Apply 1 Application topically 4 (four) times daily. Plotnikov, Aleksei V, MD  Active Self            Home Care and Equipment/Supplies: Were Home Health Services Ordered?: NA Any new equipment or medical supplies ordered?: NA  Functional Questionnaire: Do you need assistance with bathing/showering or dressing?: No Do you need assistance with meal preparation?: No Do you need assistance with eating?: No Do you have difficulty maintaining continence: No Do you need assistance with getting out of bed/getting out of a chair/moving?: No Do you have difficulty managing or taking your medications?: No  Follow up appointments reviewed: PCP Follow-up appointment confirmed?: Yes Date of PCP follow-up appointment?: 03/25/24 Follow-up Provider: [facility] Follow-up appointment confirmed?: NA Do you need transportation to your follow-up appointment?: No Do you understand care options if your condition(s) worsen?: Yes-patient verbalized understanding  SDOH Interventions Today    Flowsheet Row Most Recent Value  SDOH Interventions   Food Insecurity Interventions Intervention Not Indicated  Housing Interventions Intervention Not Indicated  Transportation Interventions Intervention Not Indicated, Patient Resources (Friends/Family)  Utilities Interventions Intervention Not Indicated  RN discussed the Labs that patient need to get done RN discussed the medications paused  Discussed and offered 30 day TOC program.  Patient  declined. Patient wanted to continue  with Complex Care Management. The patient has been provided with contact information for the care management team and has been advised to call with any health -related questions or concerns.  The patient verbalized understanding with current plan of care.  The patient is directed to their insurance card regarding availability of benefits coverage  Cathlean Headland BSN RN [facility] Health Care Management Coordinator [email or web address] Direct Dial: [phone]  Fax: [phone] Website: [email or web address]  "

## 2024-03-16 NOTE — Telephone Encounter (Signed)
 Copied from CRM [id number]. Topic: Clinical - Request for Lab/Test Order >> Mar 16, 2024 10:54 AM Charolett L wrote: Reason for CRM: Patient stated that she has a hospital follow up appt for the 21st but the Doctor from the hospital stated that she needs labs done by the 14th. Patient requesting to get an order for labs and a phone call once orders are placed

## 2024-03-16 NOTE — Addendum Note (Signed)
 Addended by: HEDDY IP R on: 03/16/2024 12:18 PM   Modules accepted: Orders

## 2024-03-19 ENCOUNTER — Ambulatory Visit: Payer: Self-pay | Admitting: Internal Medicine

## 2024-03-19 ENCOUNTER — Other Ambulatory Visit

## 2024-03-19 DIAGNOSIS — K31819 Angiodysplasia of stomach and duodenum without bleeding: Secondary | ICD-10-CM

## 2024-03-19 LAB — CBC WITH DIFFERENTIAL/PLATELET
Basophils Absolute: 0 K/uL (ref 0.0–0.1)
Basophils Relative: 0.8 % (ref 0.0–3.0)
Eosinophils Absolute: 0.1 K/uL (ref 0.0–0.7)
Eosinophils Relative: 2.8 % (ref 0.0–5.0)
HCT: 34.1 % — ABNORMAL LOW (ref 36.0–46.0)
Hemoglobin: 11.1 g/dL — ABNORMAL LOW (ref 12.0–15.0)
Lymphocytes Relative: 36.5 % (ref 12.0–46.0)
Lymphs Abs: 1.5 K/uL (ref 0.7–4.0)
MCHC: 32.7 g/dL (ref 30.0–36.0)
MCV: 81.8 fl (ref 78.0–100.0)
Monocytes Absolute: 0.5 K/uL (ref 0.1–1.0)
Monocytes Relative: 11.3 % (ref 3.0–12.0)
Neutro Abs: 2 K/uL (ref 1.4–7.7)
Neutrophils Relative %: 48.6 % (ref 43.0–77.0)
Platelets: 387 K/uL (ref 150.0–400.0)
RBC: 4.17 Mil/uL (ref 3.87–5.11)
RDW: 20.2 % — ABNORMAL HIGH (ref 11.5–15.5)
WBC: 4 K/uL (ref 4.0–10.5)

## 2024-03-19 LAB — COMPREHENSIVE METABOLIC PANEL WITH GFR
ALT: 25 U/L (ref 3–35)
AST: 24 U/L (ref 5–37)
Albumin: 4.1 g/dL (ref 3.5–5.2)
Alkaline Phosphatase: 75 U/L (ref 39–117)
BUN: 15 mg/dL (ref 6–23)
CO2: 27 meq/L (ref 19–32)
Calcium: 9.6 mg/dL (ref 8.4–10.5)
Chloride: 107 meq/L (ref 96–112)
Creatinine, Ser: 1.22 mg/dL — ABNORMAL HIGH (ref 0.40–1.20)
GFR: 43.96 mL/min — ABNORMAL LOW
Glucose, Bld: 105 mg/dL — ABNORMAL HIGH (ref 70–99)
Potassium: 3.7 meq/L (ref 3.5–5.1)
Sodium: 142 meq/L (ref 135–145)
Total Bilirubin: 0.2 mg/dL (ref 0.2–1.2)
Total Protein: 6.9 g/dL (ref 6.0–8.3)

## 2024-03-24 ENCOUNTER — Inpatient Hospital Stay: Admission: RE | Admit: 2024-03-24 | Source: Ambulatory Visit

## 2024-03-25 ENCOUNTER — Telehealth: Payer: Self-pay

## 2024-03-25 ENCOUNTER — Ambulatory Visit: Admitting: Internal Medicine

## 2024-03-25 ENCOUNTER — Encounter: Payer: Self-pay | Admitting: Internal Medicine

## 2024-03-25 ENCOUNTER — Other Ambulatory Visit: Payer: Self-pay

## 2024-03-25 VITALS — BP 118/78 | HR 59 | Temp 98.1°F | Ht 65.0 in | Wt 177.0 lb

## 2024-03-25 DIAGNOSIS — Z8673 Personal history of transient ischemic attack (TIA), and cerebral infarction without residual deficits: Secondary | ICD-10-CM

## 2024-03-25 DIAGNOSIS — N183 Chronic kidney disease, stage 3 unspecified: Secondary | ICD-10-CM | POA: Diagnosis not present

## 2024-03-25 DIAGNOSIS — Z8719 Personal history of other diseases of the digestive system: Secondary | ICD-10-CM | POA: Diagnosis not present

## 2024-03-25 DIAGNOSIS — D649 Anemia, unspecified: Secondary | ICD-10-CM

## 2024-03-25 DIAGNOSIS — N1831 Chronic kidney disease, stage 3a: Secondary | ICD-10-CM

## 2024-03-25 DIAGNOSIS — D508 Other iron deficiency anemias: Secondary | ICD-10-CM

## 2024-03-25 DIAGNOSIS — I10 Essential (primary) hypertension: Secondary | ICD-10-CM

## 2024-03-25 MED ORDER — FERREX 150 150 MG PO CAPS: 150.0000 mg | ORAL_CAPSULE | Freq: Two times a day (BID) | ORAL | 5 refills | Status: AC

## 2024-03-25 NOTE — Assessment & Plan Note (Signed)
 Monitor GFR Hydrate well
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
## 2024-03-25 NOTE — Assessment & Plan Note (Signed)
 On Valsartan , Toprol 

## 2024-03-25 NOTE — Assessment & Plan Note (Signed)
On Plavix ASA d/c'd S/p R endarterectomy 11/20

## 2024-03-25 NOTE — Progress Notes (Signed)
 "  Subjective:  Patient ID: Judy Carlson, female    DOB: 06-01-1950  Age: 74 y.o. MRN: [id number]  CC: Hospitalization Follow-up   HPI Judy Carlson presents for lower GI bleed, anemia, PAD. On PO iron   Per hx (recent hospital stay):  Admit date:     03/10/2024  Discharge date: 03/14/2024  Discharge Physician: Judy Carlson    PCP: Judy Karlynn GAILS, MD        Recommendations at discharge:  Follow up with PCP Dr. Garald in 1 week Dr. Garald: Please obtain BMP and CBC this Thursday (discharge Cr 1.45, K 4.3, Hgb 9.3) Resume Plavix  and aspirin  in 3 days and/or when bleeding has stably stopped Please resume amlodipine  valsartan  as approrpiate              Follow up with [facility] as informed         Discharge Diagnoses: Principal discharge diagnosis   Acute diverticular bleed Active Problems:   Acute blood loss anemia   Essential hypertension, benign   CKD (chronic kidney disease) stage 3a, GFR 30-59 ml/min (HCC)   COPD (chronic obstructive pulmonary disease) (HCC)   History of transient ischemic attack (TIA)   AVM (arteriovenous malformation)   Obesity, Class I, BMI 30-34.9   AVM (arteriovenous malformation) of duodenum, acquired         Hospital Course: 74 y.o. F with HTN, CKD IIIa baseline 1.1, hx CVA, PVD s/p R CEA and long-standing IDA due to GI blood loss, duodenitis, diverticular disease and known intestinal AVMs who presented with red bowel movements.       GI bleed, likely diverticular Acute blood loss anemia Patient admitted with red bloody bowel movements.  This tapered, and she went for EGD, flex sig 1/9 with Dr. Abran.    EGD showed small AVMs in the duodenum which were treated with APC; flex sig showed many diverticuli and dried blood in the colon, suspect diverticular bleed.   Monitored overnight, bleeding has clinically stopped.  Recommend hold aspirin /Plavix  3 more days and until spotting stops.     Follow up with PCP for  labs this Thursday      Essential hypertension, benign BP relatively low in setting of bleed. Metoprolol  continued, amlodipine  valsartan  held until PCP follow up                  The [place]  Controlled Substances Registry was reviewed for this patient prior to discharge.   Consultants: GI Dr. Abran Procedures performed: EGD Flex sig   Disposition: Home Diet recommendation:  Cardiac diet  Outpatient Medications Prior to Visit  Medication Sig Dispense Refill   acetaminophen  (TYLENOL ) 500 MG tablet Take 1,000 mg by mouth as needed for mild pain (pain score 1-3), headache or fever.     amLODipine -valsartan  (EXFORGE ) 5-160 MG tablet Take 1 tablet by mouth daily. 90 tablet 1   aspirin  EC 81 MG tablet Take 1 tablet (81 mg total) by mouth daily at 6 (six) AM. Please resume on 12/16/2023     Cholecalciferol  (VITAMIN D3) 50 MCG (2000 UT) capsule TAKE 1 CAPSULE (2,000 UNITS TOTAL) BY MOUTH DAILY. 90 capsule 3   clopidogrel  (PLAVIX ) 75 MG tablet Take 1 tablet (75 mg total) by mouth daily. Please resume on 12/14/2023 90 tablet 3   colchicine  0.6 MG tablet Take two tablets prn gout attack.Then take another one in 1-2 hrs. Do not repeat for 3 days. 18 tablet 1   escitalopram  (LEXAPRO ) 10 MG  tablet Take 1 tablet (10 mg total) by mouth daily. 90 tablet 1   Evolocumab  (REPATHA  SURECLICK) 140 MG/ML SOAJ Inject 140 mg into the skin every 14 (fourteen) days. 2 mL 6   ezetimibe  (ZETIA ) 10 MG tablet Take 1 tablet (10 mg total) by mouth daily. 90 tablet 3   furosemide  (LASIX ) 20 MG tablet Take 1 tablet (20 mg total) by mouth as needed for edema. 30 tablet 0   Melatonin 10 MG TABS Take by mouth.     metoprolol  succinate (TOPROL -XL) 100 MG 24 hr tablet TAKE 1 TABLET DAILY WITH OR IMMEDIATELY FOLLOWING A MEAL. 90 tablet 3   Multiple Vitamin (MULTIVITAMIN) tablet Take 1 tablet by mouth daily.     Olopatadine  HCl (PATADAY  OP) Place 1 drop into both eyes daily as needed (For dry eyes).      pantoprazole  (PROTONIX ) 40 MG tablet Take 1 tablet (40 mg total) by mouth daily. Take 40 mg twice a day for 2 weeks and then once a day     PRESCRIPTION MEDICATION Take 0.5 tablets by mouth as needed. Pt starts to itch when taking a whole tablet, she has been taking a half if pain is greater than tylenol  *Hydrocodone /acetaminaphen*     triamcinolone  ointment (KENALOG ) 0.5 % Apply 1 Application topically 4 (four) times daily. 60 g 2   FERREX 150 150 MG capsule Take 150 mg by mouth daily.     No facility-administered medications prior to visit.    ROS: Review of Systems  Constitutional:  Positive for fatigue. Negative for activity change, appetite change, chills and unexpected weight change.  HENT:  Negative for congestion, mouth sores and sinus pressure.   Eyes:  Negative for visual disturbance.  Respiratory:  Negative for cough and chest tightness.   Gastrointestinal:  Negative for abdominal pain and nausea.  Genitourinary:  Negative for difficulty urinating, frequency and vaginal pain.  Musculoskeletal:  Negative for back pain and gait problem.  Skin:  Negative for pallor and rash.  Neurological:  Negative for dizziness, tremors, weakness, numbness and headaches.  Psychiatric/Behavioral:  Negative for confusion, sleep disturbance and suicidal ideas.     Objective:  BP 118/78   Pulse (!) 59   Temp 98.1 F (36.7 C) (Oral)   Ht 5' 5 (1.651 m)   Wt 177 lb (80.3 kg)   SpO2 100%   BMI 29.45 kg/m   BP Readings from Last 3 Encounters:  03/25/24 118/78  03/14/24 123/74  03/10/24 111/83    Wt Readings from Last 3 Encounters:  03/25/24 177 lb (80.3 kg)  03/10/24 181 lb 8 oz (82.3 kg)  03/04/24 179 lb 6.4 oz (81.4 kg)    Physical Exam Constitutional:      General: She is not in acute distress.    Appearance: She is well-developed.  HENT:     Head: Normocephalic.     Right Ear: External ear normal.     Left Ear: External ear normal.     Nose: Nose normal.  Eyes:      General:        Right eye: No discharge.        Left eye: No discharge.     Conjunctiva/sclera: Conjunctivae normal.     Pupils: Pupils are equal, round, and reactive to light.  Neck:     Thyroid : No thyromegaly.     Vascular: No JVD.     Trachea: No tracheal deviation.  Cardiovascular:     Rate and Rhythm: Normal rate  and regular rhythm.     Heart sounds: Normal heart sounds.  Pulmonary:     Effort: No respiratory distress.     Breath sounds: No stridor. No wheezing.  Abdominal:     General: Bowel sounds are normal. There is no distension.     Palpations: Abdomen is soft. There is no mass.     Tenderness: There is no abdominal tenderness. There is no guarding or rebound.  Musculoskeletal:        General: No tenderness.     Cervical back: Normal range of motion and neck supple. No rigidity.  Lymphadenopathy:     Cervical: No cervical adenopathy.  Skin:    Findings: No erythema or rash.  Neurological:     Cranial Nerves: No cranial nerve deficit.     Motor: No abnormal muscle tone.     Coordination: Coordination normal.     Deep Tendon Reflexes: Reflexes normal.  Psychiatric:        Behavior: Behavior normal.        Thought Content: Thought content normal.        Judgment: Judgment normal.     Lab Results  Component Value Date   WBC 4.0 03/19/2024   HGB 11.1 (L) 03/19/2024   HCT 34.1 (L) 03/19/2024   PLT 387.0 03/19/2024   GLUCOSE 105 (H) 03/19/2024   CHOL 178 09/12/2023   TRIG 111.0 09/12/2023   HDL 67.30 09/12/2023   LDLDIRECT 132.9 04/30/2007   LDLCALC 89 09/12/2023   ALT 25 03/19/2024   AST 24 03/19/2024   NA 142 03/19/2024   K 3.7 03/19/2024   CL 107 03/19/2024   CREATININE 1.22 (H) 03/19/2024   BUN 15 03/19/2024   CO2 27 03/19/2024   TSH 1.627 08/12/2023   INR 1.0 03/10/2024   HGBA1C 5.5 02/24/2024    No results found.  Assessment & Plan:   Problem List Items Addressed This Visit     Essential hypertension, benign - Primary   On Valsartan ,  Toprol       Relevant Orders   CBC with Differential/Platelet   Comprehensive metabolic panel with GFR   CKD (chronic kidney disease) stage 3, GFR 30-59 ml/min (HCC)   Monitor GFR Hydrate well      Anemia   Relevant Medications   FERREX 150 150 MG capsule   Other Relevant Orders   CBC with Differential/Platelet   Comprehensive metabolic panel with GFR   History of transient ischemic attack (TIA)   On Plavix  ASA d/c'd S/p R endarterectomy 11/20      History of GI diverticular bleed   Recent Resolved Check CBC         Meds ordered this encounter  Medications   FERREX 150 150 MG capsule    Sig: Take 1 capsule (150 mg total) by mouth 2 (two) times daily.    Dispense:  60 capsule    Refill:  5      Follow-up: Return in about 2 months (around 05/23/2024) for a follow-up visit.  Judy Noel, MD "

## 2024-03-25 NOTE — Telephone Encounter (Signed)
 Copied from CRM #[id number]. Topic: Clinical - Request for Lab/Test Order >> Mar 25, 2024  2:37 PM Judy Carlson wrote: Reason for CRM: Pt called to confirm where she is to have her labs done at and states she will go tomorrow to have them done

## 2024-03-25 NOTE — Patient Instructions (Signed)
 Visit Information  Thank you for taking time to visit with me today. Please don't hesitate to contact me if I can be of assistance to you before our next scheduled appointment.  Your next care management appointment is by telephone on 04/10/24 at 2;00 pm.   Please call the care guide team at [phone] if you need to cancel, schedule, or reschedule an appointment.   Please call the Suicide and Crisis Lifeline: 988 call the [place]  National Suicide Prevention Lifeline: (671)526-1949 or TTY: 337-397-5900 TTY 3403885940) to talk to a trained counselor if you are experiencing a Mental Health or Behavioral Health Crisis or need someone to talk to.  Heddy Shutter, RN, MSN, BSN, CCM [place]  [facility], Population Health Case Manager Phone: [phone]

## 2024-03-25 NOTE — Assessment & Plan Note (Signed)
 Recent Resolved Check CBC

## 2024-03-26 ENCOUNTER — Other Ambulatory Visit

## 2024-03-26 DIAGNOSIS — D508 Other iron deficiency anemias: Secondary | ICD-10-CM | POA: Diagnosis not present

## 2024-03-26 DIAGNOSIS — I10 Essential (primary) hypertension: Secondary | ICD-10-CM | POA: Diagnosis not present

## 2024-03-26 LAB — COMPREHENSIVE METABOLIC PANEL WITH GFR
ALT: 17 U/L (ref 3–35)
AST: 18 U/L (ref 5–37)
Albumin: 4.1 g/dL (ref 3.5–5.2)
Alkaline Phosphatase: 69 U/L (ref 39–117)
BUN: 17 mg/dL (ref 6–23)
CO2: 26 meq/L (ref 19–32)
Calcium: 9.5 mg/dL (ref 8.4–10.5)
Chloride: 105 meq/L (ref 96–112)
Creatinine, Ser: 1.26 mg/dL — ABNORMAL HIGH (ref 0.40–1.20)
GFR: 42.29 mL/min — ABNORMAL LOW
Glucose, Bld: 94 mg/dL (ref 70–99)
Potassium: 3.7 meq/L (ref 3.5–5.1)
Sodium: 140 meq/L (ref 135–145)
Total Bilirubin: 0.3 mg/dL (ref 0.2–1.2)
Total Protein: 7 g/dL (ref 6.0–8.3)

## 2024-03-26 LAB — CBC WITH DIFFERENTIAL/PLATELET
Basophils Absolute: 0 K/uL (ref 0.0–0.1)
Basophils Relative: 1.2 % (ref 0.0–3.0)
Eosinophils Absolute: 0.1 K/uL (ref 0.0–0.7)
Eosinophils Relative: 3.5 % (ref 0.0–5.0)
HCT: 34.6 % — ABNORMAL LOW (ref 36.0–46.0)
Hemoglobin: 11.4 g/dL — ABNORMAL LOW (ref 12.0–15.0)
Lymphocytes Relative: 37 % (ref 12.0–46.0)
Lymphs Abs: 1.5 K/uL (ref 0.7–4.0)
MCHC: 32.9 g/dL (ref 30.0–36.0)
MCV: 81.5 fl (ref 78.0–100.0)
Monocytes Absolute: 0.5 K/uL (ref 0.1–1.0)
Monocytes Relative: 12 % (ref 3.0–12.0)
Neutro Abs: 1.9 K/uL (ref 1.4–7.7)
Neutrophils Relative %: 46.3 % (ref 43.0–77.0)
Platelets: 325 K/uL (ref 150.0–400.0)
RBC: 4.25 Mil/uL (ref 3.87–5.11)
RDW: 18.1 % — ABNORMAL HIGH (ref 11.5–15.5)
WBC: 4 K/uL (ref 4.0–10.5)

## 2024-03-27 ENCOUNTER — Ambulatory Visit: Payer: Self-pay | Admitting: Internal Medicine

## 2024-04-01 ENCOUNTER — Inpatient Hospital Stay
Admission: RE | Admit: 2024-04-01 | Discharge: 2024-04-01 | Disposition: A | Source: Ambulatory Visit | Attending: Acute Care | Admitting: Acute Care

## 2024-04-01 DIAGNOSIS — Z122 Encounter for screening for malignant neoplasm of respiratory organs: Secondary | ICD-10-CM

## 2024-04-01 DIAGNOSIS — R911 Solitary pulmonary nodule: Secondary | ICD-10-CM

## 2024-04-01 DIAGNOSIS — Z87891 Personal history of nicotine dependence: Secondary | ICD-10-CM

## 2024-04-02 ENCOUNTER — Ambulatory Visit

## 2024-04-06 ENCOUNTER — Other Ambulatory Visit: Payer: Self-pay

## 2024-04-06 DIAGNOSIS — Z87891 Personal history of nicotine dependence: Secondary | ICD-10-CM

## 2024-04-06 DIAGNOSIS — I6529 Occlusion and stenosis of unspecified carotid artery: Secondary | ICD-10-CM

## 2024-04-06 DIAGNOSIS — Z122 Encounter for screening for malignant neoplasm of respiratory organs: Secondary | ICD-10-CM

## 2024-04-10 ENCOUNTER — Other Ambulatory Visit: Payer: Self-pay

## 2024-04-10 NOTE — Patient Instructions (Addendum)
 Visit Information  Thank you for taking time to visit with me today. Please don't hesitate to contact me if I can be of assistance to you before our next scheduled appointment.  Your next care management appointment is by telephone on 05/06/24 at 2:30 p.m.  Please call the care guide team at [phone] if you need to cancel, schedule, or reschedule an appointment.   Please call the Suicide and Crisis Lifeline: 988 call the [place]  National Suicide Prevention Lifeline: 6607186487 or TTY: 657 120 6205 TTY 580-606-5628) to talk to a trained counselor if you are experiencing a Mental Health or Behavioral Health Crisis or need someone to talk to.  Franklin Honour, RN, BSN [place]/ [facility]-Population Health RN care Insurance Underwriter dial: [phone]
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
## 2024-04-30 ENCOUNTER — Ambulatory Visit (HOSPITAL_COMMUNITY)

## 2024-04-30 ENCOUNTER — Ambulatory Visit

## 2024-05-04 ENCOUNTER — Ambulatory Visit: Admitting: Internal Medicine

## 2024-05-06 ENCOUNTER — Telehealth

## 2024-08-05 ENCOUNTER — Ambulatory Visit

## 2024-08-05 ENCOUNTER — Encounter: Admitting: Internal Medicine
